# Patient Record
Sex: Female | Born: 1985 | Hispanic: No | Marital: Single | Smoking: Current every day smoker
Health system: Southern US, Community
[De-identification: ages and names within clinical notes are randomized; demographics above are authoritative.]

## PROBLEM LIST (undated history)

## (undated) DIAGNOSIS — N390 Urinary tract infection, site not specified: Secondary | ICD-10-CM

## (undated) DIAGNOSIS — N730 Acute parametritis and pelvic cellulitis: Secondary | ICD-10-CM

## (undated) DIAGNOSIS — I38 Endocarditis, valve unspecified: Secondary | ICD-10-CM

## (undated) DIAGNOSIS — T1491XA Suicide attempt, initial encounter: Secondary | ICD-10-CM

## (undated) DIAGNOSIS — F329 Major depressive disorder, single episode, unspecified: Secondary | ICD-10-CM

## (undated) DIAGNOSIS — M4622 Osteomyelitis of vertebra, cervical region: Secondary | ICD-10-CM

## (undated) DIAGNOSIS — D649 Anemia, unspecified: Secondary | ICD-10-CM

## (undated) DIAGNOSIS — D489 Neoplasm of uncertain behavior, unspecified: Secondary | ICD-10-CM

## (undated) DIAGNOSIS — F1990 Other psychoactive substance use, unspecified, uncomplicated: Secondary | ICD-10-CM

## (undated) DIAGNOSIS — B1920 Unspecified viral hepatitis C without hepatic coma: Secondary | ICD-10-CM

## (undated) DIAGNOSIS — F32A Depression, unspecified: Secondary | ICD-10-CM

## (undated) DIAGNOSIS — F419 Anxiety disorder, unspecified: Secondary | ICD-10-CM

## (undated) HISTORY — PX: LAPAROSCOPY: SHX197

---
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
## 2012-10-15 ENCOUNTER — Encounter (HOSPITAL_COMMUNITY): Payer: Self-pay | Admitting: *Deleted

## 2012-10-15 ENCOUNTER — Emergency Department (HOSPITAL_COMMUNITY)
Admission: EM | Admit: 2012-10-15 | Discharge: 2012-10-16 | Disposition: A | Discharge status: Home / Self Care | Payer: Self-pay | Attending: Emergency Medicine | Admitting: Emergency Medicine

## 2012-10-15 DIAGNOSIS — R61 Generalized hyperhidrosis: Secondary | ICD-10-CM | POA: Insufficient documentation

## 2012-10-15 DIAGNOSIS — F1110 Opioid abuse, uncomplicated: Secondary | ICD-10-CM | POA: Insufficient documentation

## 2012-10-15 DIAGNOSIS — R259 Unspecified abnormal involuntary movements: Secondary | ICD-10-CM | POA: Insufficient documentation

## 2012-10-15 DIAGNOSIS — R110 Nausea: Secondary | ICD-10-CM | POA: Insufficient documentation

## 2012-10-15 DIAGNOSIS — F1010 Alcohol abuse, uncomplicated: Secondary | ICD-10-CM | POA: Insufficient documentation

## 2012-10-15 DIAGNOSIS — F17200 Nicotine dependence, unspecified, uncomplicated: Secondary | ICD-10-CM | POA: Insufficient documentation

## 2012-10-15 DIAGNOSIS — Z79899 Other long term (current) drug therapy: Secondary | ICD-10-CM | POA: Insufficient documentation

## 2012-10-15 DIAGNOSIS — IMO0001 Reserved for inherently not codable concepts without codable children: Secondary | ICD-10-CM | POA: Insufficient documentation

## 2012-10-15 DIAGNOSIS — N390 Urinary tract infection, site not specified: Secondary | ICD-10-CM | POA: Insufficient documentation

## 2012-10-15 DIAGNOSIS — R1084 Generalized abdominal pain: Secondary | ICD-10-CM | POA: Insufficient documentation

## 2012-10-15 LAB — CBC
HCT: 39.2 % (ref 36.0–46.0)
Hemoglobin: 13.3 g/dL (ref 12.0–15.0)
MCV: 89.3 fL (ref 78.0–100.0)
RBC: 4.39 MIL/uL (ref 3.87–5.11)
RDW: 12.8 % (ref 11.5–15.5)
WBC: 9.6 10*3/uL (ref 4.0–10.5)

## 2012-10-15 LAB — COMPREHENSIVE METABOLIC PANEL
Alkaline Phosphatase: 64 U/L (ref 39–117)
BUN: 9 mg/dL (ref 6–23)
CO2: 28 mEq/L (ref 19–32)
Chloride: 104 mEq/L (ref 96–112)
Creatinine, Ser: 0.57 mg/dL (ref 0.50–1.10)
GFR calc Af Amer: >90 mL/min (ref >90)
GFR calc non Af Amer: >90 mL/min (ref >90)
Glucose, Bld: 89 mg/dL (ref 70–99)
Potassium: 3.8 mEq/L (ref 3.5–5.1)
Total Bilirubin: 0.3 mg/dL (ref 0.3–1.2)

## 2012-10-15 LAB — URINALYSIS, ROUTINE W REFLEX MICROSCOPIC
Bilirubin Urine: NEGATIVE
Nitrite: POSITIVE — AB
Specific Gravity, Urine: 1.024 (ref 1.005–1.030)
Urobilinogen, UA: 0.2 mg/dL (ref 0.0–1.0)
pH: 6.5 (ref 5.0–8.0)

## 2012-10-15 LAB — URINE MICROSCOPIC-ADD ON

## 2012-10-15 LAB — ETHANOL: Alcohol, Ethyl (B): <11 mg/dL (ref 0–11)

## 2012-10-15 LAB — ACETAMINOPHEN LEVEL: Acetaminophen (Tylenol), Serum: <15 ug/mL (ref 10–30)

## 2012-10-15 LAB — POCT PREGNANCY, URINE: Preg Test, Ur: NEGATIVE

## 2012-10-15 LAB — SALICYLATE LEVEL: Salicylate Lvl: <2 mg/dL — ABNORMAL LOW (ref 2.8–20.0)

## 2012-10-15 LAB — RAPID URINE DRUG SCREEN, HOSP PERFORMED
Barbiturates: NOT DETECTED
Benzodiazepines: POSITIVE — AB

## 2012-10-15 MED ORDER — IBUPROFEN 200 MG PO TABS
600.0000 mg | ORAL_TABLET | Freq: Three times a day (TID) | ORAL | Status: DC | PRN
  Administered 2012-10-15 – 2012-10-16 (×2): 600 mg via ORAL
  Filled 2012-10-15 (×2): qty 1

## 2012-10-15 MED ORDER — PROMETHAZINE HCL 25 MG PO TABS
25.0000 mg | ORAL_TABLET | Freq: Once | ORAL | Status: AC
  Administered 2012-10-15: 25 mg via ORAL
  Filled 2012-10-15 (×2): qty 1

## 2012-10-15 MED ORDER — LORAZEPAM 1 MG PO TABS: 0.0000 mg | ORAL_TABLET | Freq: Four times a day (QID) | ORAL | Status: DC

## 2012-10-15 MED ORDER — FOLIC ACID 1 MG PO TABS
1.0000 mg | ORAL_TABLET | Freq: Once | ORAL | Status: AC
  Administered 2012-10-15: 1 mg via ORAL
  Filled 2012-10-15: qty 1

## 2012-10-15 MED ORDER — VITAMIN B-1 100 MG PO TABS
100.0000 mg | ORAL_TABLET | Freq: Once | ORAL | Status: AC
  Administered 2012-10-15: 100 mg via ORAL
  Filled 2012-10-15: qty 1

## 2012-10-15 MED ORDER — NICOTINE 21 MG/24HR TD PT24: 21.0000 mg | MEDICATED_PATCH | Freq: Every day | TRANSDERMAL | Status: DC

## 2012-10-15 MED ORDER — IBUPROFEN 400 MG PO TABS
800.0000 mg | ORAL_TABLET | Freq: Once | ORAL | Status: AC
  Administered 2012-10-15: 800 mg via ORAL
  Filled 2012-10-15: qty 2

## 2012-10-15 MED ORDER — LORAZEPAM 1 MG PO TABS
1.0000 mg | ORAL_TABLET | Freq: Three times a day (TID) | ORAL | Status: DC | PRN
  Administered 2012-10-16: 1 mg via ORAL
  Filled 2012-10-15: qty 1

## 2012-10-15 MED ORDER — LORAZEPAM 1 MG PO TABS: 0.0000 mg | ORAL_TABLET | Freq: Two times a day (BID) | ORAL | Status: DC

## 2012-10-15 MED ORDER — LORAZEPAM 1 MG PO TABS
1.0000 mg | ORAL_TABLET | Freq: Once | ORAL | Status: AC
  Administered 2012-10-15: 1 mg via ORAL
  Filled 2012-10-15 (×2): qty 2

## 2012-10-15 MED ORDER — ACETAMINOPHEN 325 MG PO TABS
650.0000 mg | ORAL_TABLET | ORAL | Status: DC | PRN
  Administered 2012-10-15: 650 mg via ORAL
  Filled 2012-10-15: qty 2

## 2012-10-15 MED ORDER — FOLIC ACID 1 MG PO TABS: 1.0000 mg | ORAL_TABLET | Freq: Every day | ORAL | Status: DC

## 2012-10-15 MED ORDER — ONDANSETRON HCL 4 MG PO TABS: 4.0000 mg | ORAL_TABLET | Freq: Three times a day (TID) | ORAL | Status: DC | PRN

## 2012-10-15 MED ORDER — CLONIDINE HCL 0.1 MG PO TABS
0.1000 mg | ORAL_TABLET | Freq: Once | ORAL | Status: AC
  Administered 2012-10-15: 0.1 mg via ORAL
  Filled 2012-10-15: qty 1

## 2012-10-15 MED ORDER — NITROFURANTOIN MONOHYD MACRO 100 MG PO CAPS
100.0000 mg | ORAL_CAPSULE | Freq: Two times a day (BID) | ORAL | Status: DC
  Administered 2012-10-15: 100 mg via ORAL
  Filled 2012-10-15: qty 1

## 2012-10-15 MED ORDER — ZOLPIDEM TARTRATE 5 MG PO TABS
5.0000 mg | ORAL_TABLET | Freq: Every evening | ORAL | Status: DC | PRN
  Filled 2012-10-15: qty 1

## 2012-10-15 MED ORDER — ALPRAZOLAM 0.25 MG PO TABS
1.0000 mg | ORAL_TABLET | Freq: Once | ORAL | Status: AC
  Administered 2012-10-15: 1 mg via ORAL
  Filled 2012-10-15: qty 4

## 2012-10-15 MED ORDER — VITAMIN B-1 100 MG PO TABS: 100.0000 mg | ORAL_TABLET | Freq: Every day | ORAL | Status: DC

## 2012-10-15 MED ORDER — ALUM & MAG HYDROXIDE-SIMETH 200-200-20 MG/5ML PO SUSP: 30.0000 mL | ORAL | Status: DC | PRN

## 2012-10-15 MED ORDER — ONDANSETRON 4 MG PO TBDP
8.0000 mg | ORAL_TABLET | Freq: Once | ORAL | Status: AC
  Administered 2012-10-15: 8 mg via ORAL
  Filled 2012-10-15: qty 2

## 2012-10-15 MED ORDER — ADULT MULTIVITAMIN W/MINERALS CH: 1.0000 | ORAL_TABLET | Freq: Every day | ORAL | Status: DC

## 2012-10-15 MED ORDER — THIAMINE HCL 100 MG/ML IJ SOLN: 100.0000 mg | Freq: Every day | INTRAMUSCULAR | Status: DC

## 2012-10-15 MED ORDER — ADULT MULTIVITAMIN W/MINERALS CH
1.0000 | ORAL_TABLET | Freq: Once | ORAL | Status: AC
  Administered 2012-10-15: 1 via ORAL
  Filled 2012-10-15: qty 1

## 2012-10-15 NOTE — BH Assessment (Signed)
BHH Assessment Progress Note   There are no beds available on the adult unit at [facility].  There is no detox funding available at [facility] at this time.  Clinician called [facility] and they have no female beds available for detox at this time.  Clinician talked to patient about bed availability.  Patient agreed to use outpatient resources and was given a list of them.

## 2012-10-15 NOTE — ED Notes (Signed)
Pt here for detox from heroin and etoh.  Etoh last use was 0100 and pt reports shakey when coming off.  Last heroin use was yesterday.  NO SI/HI.

## 2012-10-15 NOTE — ED Notes (Signed)
Berna Spare from [facility] Team in to see pt

## 2012-10-15 NOTE — ED Notes (Addendum)
Pt states she is here to detox from ETOH and heroin. Last use of both yesterday. Pt states she feels horrible and shaky. Skin warm and dry. Pt is alert and oriented x4. Denies SI/HI.

## 2012-10-15 NOTE — ED Notes (Signed)
Meal tray ordered 

## 2012-10-15 NOTE — ED Notes (Signed)
Pt's Mom Maniya Donovan [phone] mobile) here to see pt. Disposition still pending and to call pt's mom when she is ready.

## 2012-10-15 NOTE — ED Provider Notes (Signed)
History     CSN: [id number]  Arrival date & time 10/15/12  1248   First MD Initiated Contact with Patient 10/15/12 1307      Chief Complaint  Patient presents with  . Medical Clearance    (Consider location/radiation/quality/duration/timing/severity/associated sxs/prior treatment) HPI Comments: Patient is a 27 year old female who presents for withdrawal from alcohol and heroin. Patient endorses symptoms which include myalgias, cold sweats, "feeling shaky", generalized abdominal cramping, and nausea. Patient states the symptoms are constant without any aggravating or alleviating factors. Patient states she last drank alcohol at approximately 1 AM this morning; she drank a total of 6 beers. She also admits to using heroin intravenously at 4 PM yesterday. Patient denies suicidal or homicidal ideation and any other medical complaints. Patient states she has tried to detox in the past unsuccessfully.  The history is provided by the patient. No language interpreter was used.    History reviewed. No pertinent past medical history.  Past Surgical History  Procedure Laterality Date  . Laparoscopy      3 weeks ago    No family history on file.  History  Substance Use Topics  . Smoking status: Current Every Day Smoker  . Smokeless tobacco: Not on file  . Alcohol Use: Yes     Comment: 6 beers perday    OB History   Grav Para Term Preterm Abortions TAB SAB Ect Mult Living                  Review of Systems  Constitutional: Positive for diaphoresis (cold sweats).  Gastrointestinal: Positive for nausea and abdominal pain (abdominal cramping). Negative for vomiting and diarrhea.  Musculoskeletal: Positive for myalgias.  Psychiatric/Behavioral:       "feeling shaky"  All other systems reviewed and are negative.    Allergies  Review of patient's allergies indicates no known allergies.  Home Medications   Current Outpatient Rx  Name  Route  Sig  Dispense  Refill  .  ALPRAZolam (XANAX) 1 MG tablet   Oral   Take 1 mg by mouth at bedtime as needed for sleep or anxiety.         Marland Kitchen rOPINIRole (REQUIP) 0.5 MG tablet   Oral   Take 0.5 mg by mouth 3 (three) times daily.         . traMADol (ULTRAM) 50 MG tablet   Oral   Take 50 mg by mouth every 6 (six) hours as needed for pain.           BP 137/88  Pulse 89  Temp(Src) 98.3 F (36.8 C) (Oral)  Resp 16  SpO2 97%  LMP 10/07/2012  Physical Exam  Nursing note and vitals reviewed. Constitutional: She is oriented to person, place, and time. She appears well-developed and well-nourished. No distress.  HENT:  Head: Normocephalic and atraumatic.  Mouth/Throat: Oropharynx is clear and moist. No oropharyngeal exudate.  Eyes: Conjunctivae and EOM are normal. No scleral icterus.  Neck: Normal range of motion. Neck supple.  Cardiovascular: Normal rate, regular rhythm, normal heart sounds and intact distal pulses.   Pulmonary/Chest: Effort normal and breath sounds normal. No respiratory distress. She has no wheezes. She has no rales.  Abdominal: Soft. She exhibits no distension and no mass. There is tenderness (generalized without peritoneal signs). There is no rebound and no guarding.  Musculoskeletal: Normal range of motion.  Neurological: She is alert and oriented to person, place, and time.  Skin: Skin is warm and dry. No rash noted.  She is not diaphoretic. No erythema. No pallor.  Psychiatric: She has a normal mood and affect. Her behavior is normal.    ED Course  Procedures (including critical care time)  Labs Reviewed  CBC - Abnormal; Notable for the following:    Platelets 415 (*)    All other components within normal limits  SALICYLATE LEVEL - Abnormal; Notable for the following:    Salicylate Lvl <2.0 (*)    All other components within normal limits  URINE RAPID DRUG SCREEN (HOSP PERFORMED) - Abnormal; Notable for the following:    Opiates POSITIVE (*)    Benzodiazepines POSITIVE (*)     Tetrahydrocannabinol POSITIVE (*)    All other components within normal limits  URINALYSIS, ROUTINE W REFLEX MICROSCOPIC - Abnormal; Notable for the following:    APPearance CLOUDY (*)    Nitrite POSITIVE (*)    Leukocytes, UA LARGE (*)    All other components within normal limits  URINE MICROSCOPIC-ADD ON - Abnormal; Notable for the following:    Bacteria, UA MANY (*)    All other components within normal limits  URINE CULTURE  ACETAMINOPHEN LEVEL  COMPREHENSIVE METABOLIC PANEL  ETHANOL  POCT PREGNANCY, URINE   No results found.   1. Heroin abuse   2. ETOH abuse   3. UTI (urinary tract infection)      MDM  Patient is a 27 year old female with a history of alcohol and heroin abuse who presents for treatment of withdrawal symptoms. On physical exam patient is alert and oriented x4, nontoxic appearing, heart RRR, lungs CTAB, abdomen NTTP. Urine drug screen positive for opiates, benzodiazepines, and marijuana. No evidence of infection, anemia, or electrolyte imbalance. Liver and kidney function preserved. Urinalysis pending. Consult placed to the act team for patient evaluation and possible placement in a rehabilitation facility.  Patient signed out to Dr. Billey Chang at end of shift. Urinalysis pending.   18:21 - Patient's urine significant for infection with positive bacteria, leukocytes, nitrites; urine resulted after end of shift. Have spoken with [facility] Nurse via phone who will administer first dose of antibiotics in ED tonight. Patient medically cleared.      Antony Madura, PA-C 10/15/12 1827  Antony Madura, PA-C 10/16/12 [phone]

## 2012-10-15 NOTE — ED Provider Notes (Signed)
Per [facility] staff, patient is appropriate for D/C.  She will be provided outpatient resources  Gerhard Munch, MD 10/15/12 2308

## 2012-10-15 NOTE — ED Notes (Signed)
Dr. Jeraldine Loots rounding and pt to be discharged. Pt's mother called and vm left that pt is being discharged.

## 2012-10-16 NOTE — ED Notes (Signed)
Mother at bedside discharge instructions given to patient

## 2012-10-18 LAB — URINE CULTURE

## 2012-10-18 NOTE — ED Provider Notes (Signed)
Medical screening examination/treatment/procedure(s) were performed by non-physician practitioner and as supervising physician I was immediately available for consultation/collaboration.   Suzi Roots, MD 10/18/12 1100

## 2012-10-20 NOTE — Progress Notes (Signed)
ED Antimicrobial Stewardship Positive Culture Follow Up   Sherri Francis is an 27 y.o. female who presented to [facility] on 10/15/2012 with a chief complaint of  Chief Complaint  Patient presents with  . Medical Clearance    Recent Results (from the past 720 hour(s))  URINE CULTURE     Status: None   Collection Time    10/15/12  1:41 PM      Result Value Range Status   Specimen Description URINE, CLEAN CATCH   Final   Special Requests NONE   Final   Culture  Setup Time 10/16/2012 12:55   Final   Colony Count >=100,000 COLONIES/ML   Final   Culture ESCHERICHIA COLI   Final   Report Status 10/18/2012 FINAL   Final   Organism ID, Bacteria ESCHERICHIA COLI   Final    []  Treated with , organism resistant to prescribed antimicrobial [x]  Patient discharged originally without antimicrobial agent and treatment is now indicated  New antibiotic prescription: MacroBID 100mg  po BID x 3 days  ED Provider: Pascal Lux Wingen, PA-C   Mickeal Skinner 10/20/2012, 2:19 PM Infectious Diseases Pharmacist Phone# [phone]

## 2012-10-20 NOTE — ED Notes (Signed)
Post ED Visit - Positive Culture Follow-up: Unsuccessful Patient Follow-up  Culture assessed and recommendations reviewed by: []  Wes Dulaney, Pharm.D., BCPS [x]  Celedonio Miyamoto, Pharm.D., BCPS []  Georgina Pillion, Pharm.D., BCPS []  Anna, [place].D., BCPS, AAHIVP []  Estella Husk, Pharm.D., BCPS, AAHIVP  Positive Urine culture  [x]  Patient discharged without antimicrobial prescription and treatment is now indicated []  Organism is resistant to prescribed ED discharge antimicrobial []  Patient with positive blood cultures  Plan: Macrobid 100 mg 1 BID x 3 days per Pascal Lux Wingen.    Larena Sox 10/20/2012, 6:09 PM

## 2012-10-22 ENCOUNTER — Telehealth (HOSPITAL_COMMUNITY): Payer: Self-pay | Admitting: Emergency Medicine

## 2012-10-23 ENCOUNTER — Telehealth (HOSPITAL_COMMUNITY): Payer: Self-pay | Admitting: Emergency Medicine

## 2012-10-25 ENCOUNTER — Telehealth (HOSPITAL_COMMUNITY): Payer: Self-pay | Admitting: Emergency Medicine

## 2012-10-25 NOTE — ED Notes (Signed)
Unable to contact patient via phone. Sent letter. °

## 2012-11-22 ENCOUNTER — Telehealth (HOSPITAL_COMMUNITY): Payer: Self-pay | Admitting: Emergency Medicine

## 2016-01-13 ENCOUNTER — Encounter (HOSPITAL_COMMUNITY): Payer: Self-pay | Admitting: *Deleted

## 2016-01-13 ENCOUNTER — Inpatient Hospital Stay (HOSPITAL_COMMUNITY)
Admission: EM | Admit: 2016-01-13 | Discharge: 2016-01-16 | DRG: 759 | Disposition: A | Discharge status: Home / Self Care | Payer: Self-pay | Attending: Obstetrics & Gynecology | Admitting: Obstetrics & Gynecology

## 2016-01-13 ENCOUNTER — Emergency Department (HOSPITAL_COMMUNITY): Payer: Self-pay

## 2016-01-13 DIAGNOSIS — N739 Female pelvic inflammatory disease, unspecified: Secondary | ICD-10-CM | POA: Diagnosis present

## 2016-01-13 DIAGNOSIS — N83519 Torsion of ovary and ovarian pedicle, unspecified side: Secondary | ICD-10-CM

## 2016-01-13 DIAGNOSIS — N732 Unspecified parametritis and pelvic cellulitis: Secondary | ICD-10-CM

## 2016-01-13 DIAGNOSIS — N730 Acute parametritis and pelvic cellulitis: Principal | ICD-10-CM | POA: Diagnosis present

## 2016-01-13 DIAGNOSIS — R1111 Vomiting without nausea: Secondary | ICD-10-CM

## 2016-01-13 DIAGNOSIS — F17210 Nicotine dependence, cigarettes, uncomplicated: Secondary | ICD-10-CM | POA: Diagnosis present

## 2016-01-13 LAB — WET PREP, GENITAL
CLUE CELLS WET PREP: NONE SEEN
Sperm: NONE SEEN
TRICH WET PREP: NONE SEEN
Yeast Wet Prep HPF POC: NONE SEEN

## 2016-01-13 LAB — CBC
HCT: 38.4 % (ref 36.0–46.0)
HEMOGLOBIN: 13 g/dL (ref 12.0–15.0)
MCH: 28 pg (ref 26.0–34.0)
MCHC: 33.9 g/dL (ref 30.0–36.0)
MCV: 82.6 fL (ref 78.0–100.0)
Platelets: 485 10*3/uL — ABNORMAL HIGH (ref 150–400)
RBC: 4.65 MIL/uL (ref 3.87–5.11)
RDW: 14.5 % (ref 11.5–15.5)
WBC: 26.1 10*3/uL — ABNORMAL HIGH (ref 4.0–10.5)

## 2016-01-13 LAB — I-STAT BETA HCG BLOOD, ED (MC, WL, AP ONLY)

## 2016-01-13 LAB — COMPREHENSIVE METABOLIC PANEL
ALBUMIN: 4 g/dL (ref 3.5–5.0)
ALK PHOS: 80 U/L (ref 38–126)
ALT: 34 U/L (ref 14–54)
ANION GAP: 13 (ref 5–15)
AST: 22 U/L (ref 15–41)
BILIRUBIN TOTAL: 1 mg/dL (ref 0.3–1.2)
BUN: 9 mg/dL (ref 6–20)
CALCIUM: 9.3 mg/dL (ref 8.9–10.3)
CO2: 22 mmol/L (ref 22–32)
Chloride: 100 mmol/L — ABNORMAL LOW (ref 101–111)
Creatinine, Ser: 0.43 mg/dL — ABNORMAL LOW (ref 0.44–1.00)
GFR calc Af Amer: >60 mL/min (ref >60)
GLUCOSE: 120 mg/dL — AB (ref 65–99)
POTASSIUM: 3.5 mmol/L (ref 3.5–5.1)
Sodium: 135 mmol/L (ref 135–145)
TOTAL PROTEIN: 8.6 g/dL — AB (ref 6.5–8.1)

## 2016-01-13 LAB — LIPASE, BLOOD: Lipase: 16 U/L (ref 11–51)

## 2016-01-13 MED ORDER — LORAZEPAM 2 MG/ML IJ SOLN
1.0000 mg | Freq: Once | INTRAMUSCULAR | Status: AC
  Administered 2016-01-13: 1 mg via INTRAVENOUS
  Filled 2016-01-13: qty 1

## 2016-01-13 MED ORDER — IOPAMIDOL (ISOVUE-300) INJECTION 61%
100.0000 mL | Freq: Once | INTRAVENOUS | Status: AC | PRN
  Administered 2016-01-13: 80 mL via INTRAVENOUS

## 2016-01-13 MED ORDER — OXYCODONE-ACETAMINOPHEN 5-325 MG PO TABS
1.0000 | ORAL_TABLET | ORAL | Status: DC | PRN
  Administered 2016-01-15 – 2016-01-16 (×4): 2 via ORAL
  Filled 2016-01-13 (×4): qty 2

## 2016-01-13 MED ORDER — CEFOXITIN SODIUM 2 G IV SOLR
2.0000 g | Freq: Once | INTRAVENOUS | Status: AC
  Administered 2016-01-13: 2 g via INTRAVENOUS
  Filled 2016-01-13: qty 2

## 2016-01-13 MED ORDER — HYDROMORPHONE HCL 1 MG/ML IJ SOLN
1.0000 mg | Freq: Once | INTRAMUSCULAR | Status: AC
  Administered 2016-01-13: 1 mg via INTRAVENOUS
  Filled 2016-01-13: qty 1

## 2016-01-13 MED ORDER — HYDROMORPHONE HCL 1 MG/ML IJ SOLN
0.5000 mg | Freq: Once | INTRAMUSCULAR | Status: AC
  Administered 2016-01-13: 0.5 mg via INTRAVENOUS
  Filled 2016-01-13: qty 1

## 2016-01-13 MED ORDER — PRENATAL MULTIVITAMIN CH: 1.0000 | ORAL_TABLET | Freq: Every day | ORAL | Status: DC

## 2016-01-13 MED ORDER — ONDANSETRON HCL 4 MG/2ML IJ SOLN
4.0000 mg | Freq: Once | INTRAMUSCULAR | Status: AC
  Administered 2016-01-13: 4 mg via INTRAVENOUS
  Filled 2016-01-13: qty 2

## 2016-01-13 MED ORDER — DEXTROSE 5 % IV SOLN
2.0000 g | Freq: Four times a day (QID) | INTRAVENOUS | Status: DC
  Administered 2016-01-14 – 2016-01-16 (×10): 2 g via INTRAVENOUS
  Filled 2016-01-13 (×12): qty 2

## 2016-01-13 MED ORDER — SODIUM CHLORIDE 0.9 % IV BOLUS (SEPSIS)
1000.0000 mL | Freq: Once | INTRAVENOUS | Status: AC
  Administered 2016-01-13: 1000 mL via INTRAVENOUS

## 2016-01-13 MED ORDER — DOXYCYCLINE HYCLATE 100 MG IV SOLR
100.0000 mg | Freq: Once | INTRAVENOUS | Status: AC
  Administered 2016-01-13: 100 mg via INTRAVENOUS
  Filled 2016-01-13: qty 100

## 2016-01-13 MED ORDER — CEFAZOLIN SODIUM-DEXTROSE 2-4 GM/100ML-% IV SOLN: 2.0000 g | Freq: Once | INTRAVENOUS | Status: DC

## 2016-01-13 MED ORDER — SODIUM CHLORIDE 0.9 % IV SOLN
INTRAVENOUS | Status: DC
  Administered 2016-01-13 – 2016-01-16 (×4): via INTRAVENOUS

## 2016-01-13 MED ORDER — KETOROLAC TROMETHAMINE 30 MG/ML IJ SOLN: 30.0000 mg | Freq: Four times a day (QID) | INTRAMUSCULAR | Status: DC

## 2016-01-13 MED ORDER — KETOROLAC TROMETHAMINE 30 MG/ML IJ SOLN
30.0000 mg | Freq: Four times a day (QID) | INTRAMUSCULAR | Status: DC
  Administered 2016-01-14 – 2016-01-16 (×11): 30 mg via INTRAVENOUS
  Filled 2016-01-13 (×11): qty 1

## 2016-01-13 MED ORDER — SODIUM CHLORIDE 0.9 % IV SOLN
INTRAVENOUS | Status: DC
  Administered 2016-01-13: 22:00:00 via INTRAVENOUS

## 2016-01-13 MED ORDER — HYDROMORPHONE HCL 1 MG/ML IJ SOLN
0.2000 mg | INTRAMUSCULAR | Status: DC | PRN
  Administered 2016-01-14 (×5): 0.6 mg via INTRAVENOUS
  Filled 2016-01-13 (×5): qty 1

## 2016-01-13 MED ORDER — DOXYCYCLINE HYCLATE 100 MG IV SOLR
100.0000 mg | Freq: Two times a day (BID) | INTRAVENOUS | Status: DC
  Administered 2016-01-14 – 2016-01-16 (×5): 100 mg via INTRAVENOUS
  Filled 2016-01-13 (×6): qty 100

## 2016-01-13 NOTE — ED Provider Notes (Signed)
[facility] DEPT Provider Note   CSN: [id number] Arrival date & time: 01/13/16  1240  First Provider Contact:  None       History   Chief Complaint Chief Complaint  Patient presents with  . Emesis  . Abdominal Pain    HPI Sherri Francis is a 30 y.o. female.  Patient with acute onset of left lower abdominal/pelvic pain starting at 8:30 PM last night. Pain is been associated with multiple episodes of vomiting. Patient states that she was unable to sleep last night due to pain. She denies vaginal bleeding or discharge. She has had some diarrhea. She has had subjective fever but no documented fever. No treatments prior to arrival. No chest pain or shortness of breath. Patient states no previous abdominal surgeries. History of ovarian cyst when she was younger. Last menstrual period was normal for patient. The onset of this condition was acute. The course is constant. Aggravating factors: none. Alleviating factors: none.        History reviewed. No pertinent past medical history.  There are no active problems to display for this patient.   Past Surgical History:  Procedure Laterality Date  . LAPAROSCOPY     3 weeks ago    OB History    No data available       Home Medications    Prior to Admission medications   Medication Sig Start Date End Date Taking? Authorizing Provider  amphetamine-dextroamphetamine (ADDERALL) 20 MG tablet Take 30 mg by mouth 2 (two) times daily. 07/14/15  Yes Historical Provider, MD    Family History No family history on file.  Social History Social History  Substance Use Topics  . Smoking status: Current Every Day Smoker    Types: Cigarettes  . Smokeless tobacco: Never Used  . Alcohol use Yes     Comment: 6 beers perday     Allergies   Review of patient's allergies indicates no known allergies.   Review of Systems Review of Systems  Constitutional: Negative for fever.  HENT: Negative for rhinorrhea and sore throat.   Eyes:  Negative for redness.  Respiratory: Negative for cough.   Cardiovascular: Negative for chest pain.  Gastrointestinal: Positive for abdominal pain, diarrhea, nausea and vomiting. Negative for blood in stool and constipation.  Genitourinary: Negative for dysuria, vaginal bleeding and vaginal discharge.  Musculoskeletal: Negative for myalgias.  Skin: Negative for rash.  Neurological: Negative for headaches.     Physical Exam Updated Vital Signs BP 140/96 (BP Location: Left Arm)   Pulse (!) 122   Temp 98.4 F (36.9 C) (Oral)   Ht 5\' 1"  (1.549 m)   Wt 45.4 kg   SpO2 100%   BMI 18.89 kg/m   Physical Exam  Constitutional: She appears well-developed and well-nourished. She appears distressed (patient restless, uncomfortable appearing).  HENT:  Head: Normocephalic and atraumatic.  Eyes: Conjunctivae are normal. Right eye exhibits no discharge. Left eye exhibits no discharge.  Neck: Normal range of motion. Neck supple.  Cardiovascular: Normal rate, regular rhythm and normal heart sounds.   Pulmonary/Chest: Effort normal and breath sounds normal.  Abdominal: Soft. She exhibits no distension and no mass. There is tenderness (bilateral lower abd, below umbilicus). There is no guarding.  Genitourinary: Pelvic exam was performed with patient supine. There is no rash, tenderness or lesion on the right labia. There is no rash, tenderness or lesion on the left labia. Uterus is tender. Cervix exhibits motion tenderness. Cervix exhibits no discharge. Right adnexum displays tenderness (Significant). Right  adnexum displays no mass and no fullness. Left adnexum displays tenderness (Significant). Left adnexum displays no mass and no fullness.  Neurological: She is alert.  Skin: Skin is warm and dry.  Psychiatric: She has a normal mood and affect.  Nursing note and vitals reviewed.    ED Treatments / Results  Labs (all labs ordered are listed, but only abnormal results are displayed) Labs Reviewed    WET PREP, GENITAL - Abnormal; Notable for the following:       Result Value   WBC, Wet Prep HPF POC FEW (*)    All other components within normal limits  COMPREHENSIVE METABOLIC PANEL - Abnormal; Notable for the following:    Chloride 100 (*)    Glucose, Bld 120 (*)    Creatinine, Ser 0.43 (*)    Total Protein 8.6 (*)    All other components within normal limits  CBC - Abnormal; Notable for the following:    WBC 26.1 (*)    Platelets 485 (*)    All other components within normal limits  LIPASE, BLOOD  URINALYSIS, ROUTINE W REFLEX MICROSCOPIC (NOT AT [facility])  I-STAT BETA HCG BLOOD, ED ([facility], [facility], [facility] ONLY)  GC/CHLAMYDIA PROBE AMP ([place]) NOT AT [facility]    EKG  EKG Interpretation None       Radiology US Transvaginal Non-ob  Result Date: 01/13/2016 CLINICAL DATA:  Bilateral pelvic pain, left greater than right since 8:30 p.m. last night. Unknown LMP. EXAM: TRANSABDOMINAL AND TRANSVAGINAL ULTRASOUND OF PELVIS DOPPLER ULTRASOUND OF OVARIES TECHNIQUE: Both transabdominal and transvaginal ultrasound examinations of the pelvis were performed. Transabdominal technique was performed for global imaging of the pelvis including uterus, ovaries, adnexal regions, and pelvic cul-de-sac. It was necessary to proceed with endovaginal exam following the transabdominal exam to visualize the ovaries. Color and duplex Doppler ultrasound was utilized to evaluate blood flow to the ovaries. COMPARISON:  None. FINDINGS: Uterus Measurements: 6.4 x 3.0 x 4.3 cm. No fibroids or other mass visualized. Endometrium Thickness: 7.0 mm.  No focal abnormality visualized. Right ovary Measurements: 3.7 x 2.4 x 3.2 cm. Normal appearance/no adnexal mass. Left ovary Measurements: 2.2 x 1.5 x 1.8 cm. Normal appearance/no adnexal mass. Pulsed Doppler evaluation of both ovaries demonstrates normal low-resistance arterial and venous waveforms. Other findings Trace free pelvic fluid noted, likely physiologic. IMPRESSION: Normal pelvic  ultrasound.  No evidence for adnexal mass or torsion. Electronically Signed   By: Nolon Nations M.D.   On: 01/13/2016 17:15   US Pelvis Complete  Result Date: 01/13/2016 CLINICAL DATA:  Bilateral pelvic pain, left greater than right since 8:30 p.m. last night. Unknown LMP. EXAM: TRANSABDOMINAL AND TRANSVAGINAL ULTRASOUND OF PELVIS DOPPLER ULTRASOUND OF OVARIES TECHNIQUE: Both transabdominal and transvaginal ultrasound examinations of the pelvis were performed. Transabdominal technique was performed for global imaging of the pelvis including uterus, ovaries, adnexal regions, and pelvic cul-de-sac. It was necessary to proceed with endovaginal exam following the transabdominal exam to visualize the ovaries. Color and duplex Doppler ultrasound was utilized to evaluate blood flow to the ovaries. COMPARISON:  None. FINDINGS: Uterus Measurements: 6.4 x 3.0 x 4.3 cm. No fibroids or other mass visualized. Endometrium Thickness: 7.0 mm.  No focal abnormality visualized. Right ovary Measurements: 3.7 x 2.4 x 3.2 cm. Normal appearance/no adnexal mass. Left ovary Measurements: 2.2 x 1.5 x 1.8 cm. Normal appearance/no adnexal mass. Pulsed Doppler evaluation of both ovaries demonstrates normal low-resistance arterial and venous waveforms. Other findings Trace free pelvic fluid noted, likely physiologic. IMPRESSION: Normal pelvic  ultrasound.  No evidence for adnexal mass or torsion. Electronically Signed   By: Nolon Nations M.D.   On: 01/13/2016 17:15   Ct Abdomen Pelvis W Contrast  Result Date: 01/13/2016 CLINICAL DATA:  Acute generalized abdominal pain. EXAM: CT ABDOMEN AND PELVIS WITH CONTRAST TECHNIQUE: Multidetector CT imaging of the abdomen and pelvis was performed using the standard protocol following bolus administration of intravenous contrast. CONTRAST:  75mL ISOVUE-300 IOPAMIDOL (ISOVUE-300) INJECTION 61% COMPARISON:  None. FINDINGS: Visualized lung bases are unremarkable. No significant osseous abnormality  is noted. No gallstones are noted. The liver, spleen and pancreas unremarkable. Adrenal glands appear normal. Simple right renal cyst is noted. No hydronephrosis or renal obstruction is noted. Mildly dilated small bowel loops are noted in the left upper quadrant ; is uncertain if this is due to ileus or obstruction. 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 4 x 3 cm complex abnormality is noted in right adnexal region of unknown etiology. Inflammatory stranding is noted in the fat anteriorly in the pelvis. Urinary bladder is decompressed. No significant adenopathy is noted. IMPRESSION: Mildly dilated small bowel loops are noted in the left upper quadrant of the abdomen, and it is uncertain if this is due to ileus or obstruction. 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 Pelvic ultrasound may be performed for further evaluation. Inflammatory stranding is noted in the fat anteriorly in the pelvis of unknown etiology. 4 x 3 cm complex mass is noted posteriorly in the right side of the pelvis ; etiology is unknown, but potentially may represent abscess or neoplasm. Further evaluation with pelvic ultrasound or MRI may be performed. Electronically Signed   By: Marijo Conception, M.D.   On: 01/13/2016 20:09   [place] Art/ven Flow Abd Pelv Doppler  Result Date: 01/13/2016 CLINICAL DATA:  Bilateral pelvic pain, left greater than right since 8:30 p.m. last night. Unknown LMP. EXAM: TRANSABDOMINAL AND TRANSVAGINAL ULTRASOUND OF PELVIS DOPPLER ULTRASOUND OF OVARIES TECHNIQUE: Both transabdominal and transvaginal ultrasound examinations of the pelvis were performed. Transabdominal technique was performed for global imaging of the pelvis including uterus, ovaries, adnexal regions, and pelvic cul-de-sac. It was necessary to proceed with endovaginal exam following the transabdominal exam to visualize the ovaries. Color and duplex Doppler ultrasound was utilized to  evaluate blood flow to the ovaries. COMPARISON:  None. FINDINGS: Uterus Measurements: 6.4 x 3.0 x 4.3 cm. No fibroids or other mass visualized. Endometrium Thickness: 7.0 mm.  No focal abnormality visualized. Right ovary Measurements: 3.7 x 2.4 x 3.2 cm. Normal appearance/no adnexal mass. Left ovary Measurements: 2.2 x 1.5 x 1.8 cm. Normal appearance/no adnexal mass. Pulsed Doppler evaluation of both ovaries demonstrates normal low-resistance arterial and venous waveforms. Other findings Trace free pelvic fluid noted, likely physiologic. IMPRESSION: Normal pelvic ultrasound.  No evidence for adnexal mass or torsion. Electronically Signed   By: Nolon Nations M.D.   On: 01/13/2016 17:15    Procedures Procedures (including critical care time)  Medications Ordered in ED Medications  doxycycline (VIBRAMYCIN) 100 mg in dextrose 5 % 250 mL IVPB (not administered)  cefOXitin (MEFOXIN) 2 g in dextrose 5 % 50 mL IVPB (not administered)  0.9 %  sodium chloride infusion (not administered)  HYDROmorphone (DILAUDID) injection 0.5 mg (0.5 mg Intravenous Given 01/13/16 1625)  ondansetron (ZOFRAN) injection 4 mg (4 mg Intravenous Given 01/13/16 1625)  sodium chloride 0.9 % bolus 1,000 mL (1,000 mLs Intravenous New Bag/Given 01/13/16 1625)  HYDROmorphone (DILAUDID) injection 1 mg (1 mg Intravenous Given 01/13/16 1734)  iopamidol (ISOVUE-300) 61 % injection 100 mL (80 mLs Intravenous Contrast Given 01/13/16 1917)  LORazepam (ATIVAN) injection 1 mg (1 mg Intravenous Given 01/13/16 1914)  HYDROmorphone (DILAUDID) injection 1 mg (1 mg Intravenous Given 01/13/16 2007)     Initial Impression / Assessment and Plan / ED Course  I have reviewed the triage vital signs and the nursing notes.  Pertinent labs & imaging results that were available during my care of the patient were reviewed by me and considered in my medical decision making (see chart for details).  Clinical Course  Comment By Time  Patient is very uncomfortable,  will first control pain. Given pelvic pain with vomiting of acute onset, will need to rule out ovarian torsion. Will perform pelvic exam and patient comfortable. Ultrasound ordered. If this is negative, may need CT of abdomen and pelvis. Patient's white blood cell count is 26,000. Carlisle Cater, PA-C 08/07 1617   Pelvic ultrasound negative. CT ordered.  Pelvic exam performed with nurse tech chaperone. Awaiting CT results.  Patient discussed with and seen by Dr. Lita Mains. [facility].  9:33 PM Spoke with Dr. Roselie Awkward who requests patient be admitted to [facility] for IV abx in setting of possible PID/pelvic abscess. Pt started On Ancef and doxycycline.  Patient updated. Family aware.  Final Clinical Impressions(s) / ED Diagnoses   Final diagnoses:  Pelvic abscess in female  Non-intractable vomiting without nausea, vomiting of unspecified type   Admit.    New Prescriptions New Prescriptions   No medications on file     Carlisle Cater, PA-C 01/13/16 2248    Julianne Rice, MD 01/17/16 [phone]

## 2016-01-13 NOTE — ED Notes (Signed)
Patient transported to CT 

## 2016-01-13 NOTE — ED Notes (Signed)
Patient transported to Ultrasound 

## 2016-01-13 NOTE — ED Triage Notes (Addendum)
Per EMS - pt from home with c/o N/V/D and abdominal pain and cramping x20 hours.  Pt denies drug use, but has apparent track marks on arms.  Patient denies hematemesis and melena.  Patient is normotensive, 142/100, 96 HR, 99% on RA and CBG 172.  Patient received 4 mg Zofran IVP en route with EMS.

## 2016-01-13 NOTE — H&P (Signed)
History                        Chief Complaint    Chief Complaint  Patient presents with  . Emesis  . Abdominal Pain    HPI Sherri Francis is a 30 y.o. female. G0, Patient's last menstrual period was 12/30/2015.   Patient with acute onset of left lower abdominal/pelvic pain starting at 8:30 PM last night. Pain is been associated with multiple episodes of vomiting. Patient states that she was unable to sleep last night due to pain. She denies vaginal bleeding or discharge. She has had some diarrhea. She has had subjective fever but no documented fever. No treatments prior to arrival. No chest pain or shortness of breath. Patient states no previous abdominal surgeries. History of ovarian cyst when she was younger. Last menstrual period was normal for patient. The onset of this condition was acute. The course is constant. Aggravating factors: none. Alleviating factors: none.      History reviewed. No pertinent past medical history.  There are no active problems to display for this patient.        Past Surgical History:  Procedure Laterality Date  . LAPAROSCOPY  2013          OB History    No data available    H/o endometriosis after laparoscopy 2013, [place] Medications                                Prior to Admission medications   Medication Sig Start Date End Date Taking? Authorizing Provider  amphetamine-dextroamphetamine (ADDERALL) 20 MG tablet Take 30 mg by mouth 2 (two) times daily. 07/14/15  Yes Historical Provider, MD    Family History No family history on file.  Social History        Social History  Substance Use Topics  . Smoking status: Current Every Day Smoker    Types: Cigarettes  . Smokeless tobacco: Never Used  . Alcohol use Yes      Comment: 6 beers perday     Allergies                     Review of patient's allergies indicates no known allergies.   Review of Systems Review of Systems   Constitutional: Negative for fever.  HENT: Negative for rhinorrhea and sore throat.   Eyes: Negative for redness.  Respiratory: Negative for cough.   Cardiovascular: Negative for chest pain.  Gastrointestinal: Positive for abdominal pain, diarrhea, nausea and vomiting. Negative for blood in stool and constipation.  Genitourinary: Negative for dysuria, vaginal bleeding and vaginal discharge.  Musculoskeletal: Negative for myalgias.  Skin: Negative for rash.  Neurological: Negative for headaches.     Physical Exam Updated Vital Signs Blood pressure 126/83, pulse (!) 106, temperature 98.8 F (37.1 C), temperature source Oral, resp. rate 20, height 5\' 1"  (1.549 m), weight 100 lb (45.4 kg), last menstrual period 12/30/2015, SpO2 100 %.  Physical Exam  Constitutional: She appears well-developed and well-nourished. She appears distressed (patient restless, uncomfortable appearing).  HENT:  Head: Normocephalic and atraumatic.  Eyes: Conjunctivae are normal. Right eye exhibits no discharge. Left eye exhibits no discharge.  Neck: Normal range of motion. Neck supple.  Cardiovascular: Normal rate, regular rhythm and normal heart sounds.   Pulmonary/Chest: Effort normal and breath sounds normal.  Abdominal: Soft. She exhibits no distension and  no mass. There is tenderness (bilateral lower abd, below umbilicus). There is no guarding.  Genitourinary: Pelvic exam was performed with patient supine. There is no rash, tenderness or lesion on the right labia. Pelvic exam at [facility] There is no rash, tenderness or lesion on the left labia. Uterus is tender. Cervix exhibits motion tenderness. Cervix exhibits no discharge. Right adnexum displays tenderness (Significant). Right adnexum displays no mass and no fullness. Left adnexum displays tenderness (Significant). Left adnexum displays no mass and no fullness.  Neurological: She is alert.  Skin: Skin is warm and dry.  Psychiatric: She has a normal mood  and affect.  Nursing note and vitals reviewed.    ED Treatments / Results  Labs (all labs ordered are listed, but only abnormal results are displayed)      Labs Reviewed  WET PREP, GENITAL - Abnormal; Notable for the following:       Result Value    WBC, Wet Prep HPF POC FEW (*)    All other components within normal limits  COMPREHENSIVE METABOLIC PANEL - Abnormal; Notable for the following:    Chloride 100 (*)    Glucose, Bld 120 (*)    Creatinine, Ser 0.43 (*)    Total Protein 8.6 (*)    All other components within normal limits  CBC - Abnormal; Notable for the following:    WBC 26.1 (*)    Platelets 485 (*)    All other components within normal limits  LIPASE, BLOOD  URINALYSIS, ROUTINE W REFLEX MICROSCOPIC (NOT AT [facility])  I-STAT BETA HCG BLOOD, ED ([facility], [facility], [facility] ONLY)  GC/CHLAMYDIA PROBE AMP ([place]) NOT AT [facility]    EKG      EKG Interpretation None      Radiology  ImagingResults(Last48hours)  US Transvaginal Non-ob  Result Date: 01/13/2016 CLINICAL DATA:  Bilateral pelvic pain, left greater than right since 8:30 p.m. last night. Unknown LMP. EXAM: TRANSABDOMINAL AND TRANSVAGINAL ULTRASOUND OF PELVIS DOPPLER ULTRASOUND OF OVARIES TECHNIQUE: Both transabdominal and transvaginal ultrasound examinations of the pelvis were performed. Transabdominal technique was performed for global imaging of the pelvis including uterus, ovaries, adnexal regions, and pelvic cul-de-sac. It was necessary to proceed with endovaginal exam following the transabdominal exam to visualize the ovaries. Color and duplex Doppler ultrasound was utilized to evaluate blood flow to the ovaries. COMPARISON:  None. FINDINGS: Uterus Measurements: 6.4 x 3.0 x 4.3 cm. No fibroids or other mass visualized. Endometrium Thickness: 7.0 mm.  No focal abnormality visualized. Right ovary Measurements: 3.7 x 2.4 x 3.2 cm. Normal appearance/no adnexal mass. Left ovary Measurements: 2.2 x  1.5 x 1.8 cm. Normal appearance/no adnexal mass. Pulsed Doppler evaluation of both ovaries demonstrates normal low-resistance arterial and venous waveforms. Other findings Trace free pelvic fluid noted, likely physiologic. IMPRESSION: Normal pelvic ultrasound.  No evidence for adnexal mass or torsion. Electronically Signed   By: Nolon Nations M.D.   On: 01/13/2016 17:15   US Pelvis Complete  Result Date: 01/13/2016 CLINICAL DATA:  Bilateral pelvic pain, left greater than right since 8:30 p.m. last night. Unknown LMP. EXAM: TRANSABDOMINAL AND TRANSVAGINAL ULTRASOUND OF PELVIS DOPPLER ULTRASOUND OF OVARIES TECHNIQUE: Both transabdominal and transvaginal ultrasound examinations of the pelvis were performed. Transabdominal technique was performed for global imaging of the pelvis including uterus, ovaries, adnexal regions, and pelvic cul-de-sac. It was necessary to proceed with endovaginal exam following the transabdominal exam to visualize the ovaries. Color and duplex Doppler ultrasound was utilized to evaluate blood flow to the ovaries. COMPARISON:  None. FINDINGS: Uterus Measurements: 6.4 x 3.0 x 4.3 cm. No fibroids or other mass visualized. Endometrium Thickness: 7.0 mm.  No focal abnormality visualized. Right ovary Measurements: 3.7 x 2.4 x 3.2 cm. Normal appearance/no adnexal mass. Left ovary Measurements: 2.2 x 1.5 x 1.8 cm. Normal appearance/no adnexal mass. Pulsed Doppler evaluation of both ovaries demonstrates normal low-resistance arterial and venous waveforms. Other findings Trace free pelvic fluid noted, likely physiologic. IMPRESSION: Normal pelvic ultrasound.  No evidence for adnexal mass or torsion. Electronically Signed   By: Nolon Nations M.D.   On: 01/13/2016 17:15   Ct Abdomen Pelvis W Contrast  Result Date: 01/13/2016 CLINICAL DATA:  Acute generalized abdominal pain. EXAM: CT ABDOMEN AND PELVIS WITH CONTRAST TECHNIQUE: Multidetector CT imaging of the abdomen and pelvis was performed  using the standard protocol following bolus administration of intravenous contrast. CONTRAST:  50mL ISOVUE-300 IOPAMIDOL (ISOVUE-300) INJECTION 61% COMPARISON:  None. FINDINGS: Visualized lung bases are unremarkable. No significant osseous abnormality is noted. No gallstones are noted. The liver, spleen and pancreas unremarkable. Adrenal glands appear normal. Simple right renal cyst is noted. No hydronephrosis or renal obstruction is noted. Mildly dilated small bowel loops are noted in the left upper quadrant ; is uncertain if this is due to ileus or obstruction. 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 4 x 3 cm complex abnormality is noted in right adnexal region of unknown etiology. Inflammatory stranding is noted in the fat anteriorly in the pelvis. Urinary bladder is decompressed. No significant adenopathy is noted. IMPRESSION: Mildly dilated small bowel loops are noted in the left upper quadrant of the abdomen, and it is uncertain if this is due to ileus or obstruction. 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 Pelvic ultrasound may be performed for further evaluation. Inflammatory stranding is noted in the fat anteriorly in the pelvis of unknown etiology. 4 x 3 cm complex mass is noted posteriorly in the right side of the pelvis ; etiology is unknown, but potentially may represent abscess or neoplasm. Further evaluation with pelvic ultrasound or MRI may be performed. Electronically Signed   By: Marijo Conception, M.D.   On: 01/13/2016 20:09   [place] Art/ven Flow Abd Pelv Doppler  Result Date: 01/13/2016 CLINICAL DATA:  Bilateral pelvic pain, left greater than right since 8:30 p.m. last night. Unknown LMP. EXAM: TRANSABDOMINAL AND TRANSVAGINAL ULTRASOUND OF PELVIS DOPPLER ULTRASOUND OF OVARIES TECHNIQUE: Both transabdominal and transvaginal ultrasound examinations of the pelvis were performed. Transabdominal technique was performed for global  imaging of the pelvis including uterus, ovaries, adnexal regions, and pelvic cul-de-sac. It was necessary to proceed with endovaginal exam following the transabdominal exam to visualize the ovaries. Color and duplex Doppler ultrasound was utilized to evaluate blood flow to the ovaries. COMPARISON:  None. FINDINGS: Uterus Measurements: 6.4 x 3.0 x 4.3 cm. No fibroids or other mass visualized. Endometrium Thickness: 7.0 mm.  No focal abnormality visualized. Right ovary Measurements: 3.7 x 2.4 x 3.2 cm. Normal appearance/no adnexal mass. Left ovary Measurements: 2.2 x 1.5 x 1.8 cm. Normal appearance/no adnexal mass. Pulsed Doppler evaluation of both ovaries demonstrates normal low-resistance arterial and venous waveforms. Other findings Trace free pelvic fluid noted, likely physiologic. IMPRESSION: Normal pelvic ultrasound.  No evidence for adnexal mass or torsion. Electronically Signed   By: Nolon Nations M.D.   On: 01/13/2016 17:15     Procedures Procedures (including critical care time)  Medications Ordered in ED  Medications  doxycycline (VIBRAMYCIN) 100 mg in dextrose 5 % 250 mL IVPB (not administered)  cefOXitin (MEFOXIN) 2 g in dextrose 5 % 50 mL IVPB (not administered)  0.9 %  sodium chloride infusion (not administered)  HYDROmorphone (DILAUDID) injection 0.5 mg (0.5 mg Intravenous Given 01/13/16 1625)  ondansetron (ZOFRAN) injection 4 mg (4 mg Intravenous Given 01/13/16 1625)  sodium chloride 0.9 % bolus 1,000 mL (1,000 mLs Intravenous New Bag/Given 01/13/16 1625)  HYDROmorphone (DILAUDID) injection 1 mg (1 mg Intravenous Given 01/13/16 1734)  iopamidol (ISOVUE-300) 61 % injection 100 mL (80 mLs Intravenous Contrast Given 01/13/16 1917)  LORazepam (ATIVAN) injection 1 mg (1 mg Intravenous Given 01/13/16 1914)  HYDROmorphone (DILAUDID) injection 1 mg (1 mg Intravenous Given 01/13/16 2007)     Initial Impression / Assessment and Plan / ED Course  I have reviewed the triage vital signs and the  nursing notes.  Pertinent labs & imaging results that were available during my care of the patient were reviewed by me and considered in my medical decision making (see chart for details).       Clinical Course   Comment By Time  Patient is very uncomfortable, will first control pain. Given pelvic pain with vomiting of acute onset, will need to rule out ovarian torsion. Will perform pelvic exam and patient comfortable. Ultrasound ordered. If this is negative, may need CT of abdomen and pelvis. Patient's white blood cell count is 26,000. Carlisle Cater, PA-C 08/07 1617   Pelvic ultrasound negative. CT ordered.   Patient updated. Family aware.  Final Clinical Impressions(s) / ED Diagnoses   Final diagnoses:  Pelvic abscess in female  Non-intractable vomiting without nausea, vomiting of unspecified type  H/O drug and alcohol abuse Admit to Women's unit at [facility] Pain management IV antibiotic cefoxitin and doxycycline Urinalysis and drug screen     Carlisle Cater, PA-C 01/13/16 2248

## 2016-01-13 NOTE — ED Triage Notes (Signed)
Pt presented with sever abdominal pain. Pt is stated she "has never had pain like this". Pt denies blood in vomit or bloody stools.

## 2016-01-13 NOTE — ED Notes (Signed)
[facility] at bedside 

## 2016-01-14 ENCOUNTER — Encounter (HOSPITAL_COMMUNITY): Payer: Self-pay

## 2016-01-14 LAB — RAPID URINE DRUG SCREEN, HOSP PERFORMED
AMPHETAMINES: POSITIVE — AB
BARBITURATES: NOT DETECTED
BENZODIAZEPINES: NOT DETECTED
COCAINE: NOT DETECTED
OPIATES: POSITIVE — AB
TETRAHYDROCANNABINOL: NOT DETECTED

## 2016-01-14 LAB — URINALYSIS, ROUTINE W REFLEX MICROSCOPIC
Bilirubin Urine: NEGATIVE
GLUCOSE, UA: NEGATIVE mg/dL
HGB URINE DIPSTICK: NEGATIVE
Ketones, ur: 15 mg/dL — AB
LEUKOCYTES UA: NEGATIVE
Nitrite: NEGATIVE
PROTEIN: NEGATIVE mg/dL
SPECIFIC GRAVITY, URINE: 1.01 (ref 1.005–1.030)
pH: 5.5 (ref 5.0–8.0)

## 2016-01-14 LAB — GC/CHLAMYDIA PROBE AMP (~~LOC~~) NOT AT ARMC
Chlamydia: NEGATIVE
Neisseria Gonorrhea: NEGATIVE

## 2016-01-14 MED ORDER — LORAZEPAM 1 MG PO TABS: 1.0000 mg | ORAL_TABLET | Freq: Four times a day (QID) | ORAL | Status: DC | PRN

## 2016-01-14 MED ORDER — LORAZEPAM 2 MG/ML IJ SOLN
1.0000 mg | Freq: Four times a day (QID) | INTRAMUSCULAR | Status: DC | PRN
  Administered 2016-01-15 (×2): 1 mg via INTRAVENOUS
  Filled 2016-01-14 (×4): qty 0.5

## 2016-01-14 MED ORDER — FOLIC ACID 1 MG PO TABS
1.0000 mg | ORAL_TABLET | Freq: Every day | ORAL | Status: DC
  Administered 2016-01-16: 1 mg via ORAL
  Filled 2016-01-14 (×4): qty 1

## 2016-01-14 MED ORDER — THIAMINE HCL 100 MG/ML IJ SOLN
100.0000 mg | Freq: Every day | INTRAMUSCULAR | Status: DC
  Administered 2016-01-14: 100 mg via INTRAVENOUS
  Filled 2016-01-14 (×4): qty 1

## 2016-01-14 MED ORDER — ONDANSETRON HCL 4 MG/2ML IJ SOLN
4.0000 mg | Freq: Four times a day (QID) | INTRAMUSCULAR | Status: DC | PRN
  Administered 2016-01-14 – 2016-01-16 (×3): 4 mg via INTRAVENOUS
  Filled 2016-01-14 (×4): qty 2

## 2016-01-14 MED ORDER — HYDROMORPHONE HCL 1 MG/ML IJ SOLN
1.0000 mg | INTRAMUSCULAR | Status: DC | PRN
  Administered 2016-01-14 – 2016-01-16 (×11): 1 mg via INTRAVENOUS
  Filled 2016-01-14 (×13): qty 1

## 2016-01-14 MED ORDER — VITAMIN B-1 100 MG PO TABS
100.0000 mg | ORAL_TABLET | Freq: Every day | ORAL | Status: DC
  Administered 2016-01-16: 100 mg via ORAL
  Filled 2016-01-14 (×4): qty 1

## 2016-01-14 MED ORDER — LORAZEPAM 2 MG/ML IJ SOLN: 0.0000 mg | Freq: Two times a day (BID) | INTRAMUSCULAR | Status: DC

## 2016-01-14 MED ORDER — ADULT MULTIVITAMIN W/MINERALS CH
1.0000 | ORAL_TABLET | Freq: Every day | ORAL | Status: DC
  Administered 2016-01-16: 1 via ORAL
  Filled 2016-01-14 (×4): qty 1

## 2016-01-14 MED ORDER — LORAZEPAM 2 MG/ML IJ SOLN
0.0000 mg | Freq: Four times a day (QID) | INTRAMUSCULAR | Status: DC
  Filled 2016-01-14 (×5): qty 2

## 2016-01-15 LAB — CBC
HEMATOCRIT: 29.4 % — AB (ref 36.0–46.0)
HEMOGLOBIN: 9.6 g/dL — AB (ref 12.0–15.0)
MCH: 27 pg (ref 26.0–34.0)
MCHC: 32.7 g/dL (ref 30.0–36.0)
MCV: 82.8 fL (ref 78.0–100.0)
Platelets: 340 10*3/uL (ref 150–400)
RBC: 3.55 MIL/uL — ABNORMAL LOW (ref 3.87–5.11)
RDW: 14.7 % (ref 11.5–15.5)
WBC: 9.8 10*3/uL (ref 4.0–10.5)

## 2016-01-15 NOTE — Progress Notes (Signed)
Subjective: Patient reports improvement in her pain in comparison to admission. She is experiencing some nausea this morning but no emesis.    Objective: I have reviewed patient's vital signs. Blood pressure 127/81, pulse 69, temperature 98.7 F (37.1 C), temperature source Oral, resp. rate 18, height 5\' 1"  (1.549 m), weight 100 lb (45.4 kg), last menstrual period 12/30/2015, SpO2 100 %.  General: alert, cooperative and no distress Resp: clear to auscultation bilaterally Cardio: regular rate and rhythm GI: soft, non distended, voluntary guarding, no rebound. Tenderness localized to lower quadrants Extremities: extremities normal, atraumatic, no cyanosis or edema   Assessment/Plan: 30 yo with pelvic abscess - Patient has been afebrile since admission - Continue IV antibiotics - WBC now normal - discharge planning tomorrow after 48 hours of parenteral antibiotics - Continue current care   LOS: 2 days    Terrina Docter 01/15/2016, 7:36 AM

## 2016-01-15 NOTE — Progress Notes (Signed)
Ativan 0.70ml wasted in sink. Tayna Corbitt Rn wittnessed.

## 2016-01-15 NOTE — Progress Notes (Signed)
Ativan 0.24ml wasted in sink. Vashti Hey RN witnessed.

## 2016-01-16 DIAGNOSIS — N730 Acute parametritis and pelvic cellulitis: Principal | ICD-10-CM

## 2016-01-16 MED ORDER — METRONIDAZOLE 500 MG PO TABS: 500.0000 mg | ORAL_TABLET | Freq: Two times a day (BID) | ORAL | 0 refills | Status: DC

## 2016-01-16 MED ORDER — DOXYCYCLINE HYCLATE 100 MG PO CAPS: 100.0000 mg | ORAL_CAPSULE | Freq: Two times a day (BID) | ORAL | 0 refills | Status: DC

## 2016-01-16 MED ORDER — IBUPROFEN 800 MG PO TABS: 800.0000 mg | ORAL_TABLET | Freq: Three times a day (TID) | ORAL | 3 refills | Status: DC | PRN

## 2016-01-16 MED ORDER — PROMETHAZINE HCL 25 MG PO TABS
25.0000 mg | ORAL_TABLET | Freq: Four times a day (QID) | ORAL | 2 refills | Status: DC | PRN
Start: 2016-01-16 — End: 2017-01-24

## 2016-01-16 MED ORDER — OXYCODONE-ACETAMINOPHEN 5-325 MG PO TABS: 1.0000 | ORAL_TABLET | ORAL | 0 refills | Status: DC | PRN

## 2016-01-16 NOTE — Progress Notes (Signed)
Pt discharged to home with significant other.  Condition stable.  Pt ambulated to car with A. Lysbeth Galas, [place].  No equipment for home ordered at discharge.

## 2016-01-16 NOTE — Discharge Summary (Signed)
Physician Discharge Summary  Patient ID: Sherri Francis MRN: [id number] DOB/AGE: 12/14/1985 30 y.o.  Admit date: 01/13/2016 Discharge date: 01/16/2016  Admission Diagnoses:  Discharge Diagnoses:  Active Problems:   Pelvic abscess in female   PID (acute pelvic inflammatory disease)   Discharged Condition: stable  Hospital Course: Patient was admitted with pelvic abscess and PID. Received IV antibiotics and analgesia as needed, and her WBC was noted to decrease from 26 to 9.8.   She remained afebrile throughout her stay.  Pain control was difficult initially, was treated with Dilaudid and Toradol then transitioned to Percocet and Ibuprofen. On the day of discharge, she was deemed stable to go home and complete outpatient PID therapy.   Significant Diagnostic Studies:  Results for orders placed or performed during the hospital encounter of 01/13/16 (from the past 168 hour(s))  GC/Chlamydia probe amp   Collection Time: 01/13/16 12:00 AM  Result Value Ref Range   Chlamydia Negative    Neisseria gonorrhea Negative   Lipase, blood   Collection Time: 01/13/16  1:09 PM  Result Value Ref Range   Lipase 16 11 - 51 U/L  Comprehensive metabolic panel   Collection Time: 01/13/16  1:09 PM  Result Value Ref Range   Sodium 135 135 - 145 mmol/L   Potassium 3.5 3.5 - 5.1 mmol/L   Chloride 100 (L) 101 - 111 mmol/L   CO2 22 22 - 32 mmol/L   Glucose, Bld 120 (H) 65 - 99 mg/dL   BUN 9 6 - 20 mg/dL   Creatinine, Ser 0.43 (L) 0.44 - 1.00 mg/dL   Calcium 9.3 8.9 - 10.3 mg/dL   Total Protein 8.6 (H) 6.5 - 8.1 g/dL   Albumin 4.0 3.5 - 5.0 g/dL   AST 22 15 - 41 U/L   ALT 34 14 - 54 U/L   Alkaline Phosphatase 80 38 - 126 U/L   Total Bilirubin 1.0 0.3 - 1.2 mg/dL   GFR calc non Af Amer >60 >60 mL/min   GFR calc Af Amer >60 >60 mL/min   Anion gap 13 5 - 15  CBC   Collection Time: 01/13/16  1:09 PM  Result Value Ref Range   WBC 26.1 (H) 4.0 - 10.5 K/uL   RBC 4.65 3.87 - 5.11 MIL/uL   Hemoglobin 13.0  12.0 - 15.0 g/dL   HCT 38.4 36.0 - 46.0 %   MCV 82.6 78.0 - 100.0 fL   MCH 28.0 26.0 - 34.0 pg   MCHC 33.9 30.0 - 36.0 g/dL   RDW 14.5 11.5 - 15.5 %   Platelets 485 (H) 150 - 400 K/uL  I-Stat beta hCG blood, ED   Collection Time: 01/13/16  1:42 PM  Result Value Ref Range   I-stat hCG, quantitative <5.0 <5 mIU/mL   Comment 3          Wet prep, genital   Collection Time: 01/13/16  4:00 PM  Result Value Ref Range   Yeast Wet Prep HPF POC NONE SEEN NONE SEEN   Trich, Wet Prep NONE SEEN NONE SEEN   Clue Cells Wet Prep HPF POC NONE SEEN NONE SEEN   WBC, Wet Prep HPF POC FEW (A) NONE SEEN   Sperm NONE SEEN   Urine rapid drug screen (hosp performed)   Collection Time: 01/13/16 11:50 PM  Result Value Ref Range   Opiates POSITIVE (A) NONE DETECTED   Cocaine NONE DETECTED NONE DETECTED   Benzodiazepines NONE DETECTED NONE DETECTED   Amphetamines POSITIVE (A) NONE  DETECTED   Tetrahydrocannabinol NONE DETECTED NONE DETECTED   Barbiturates NONE DETECTED NONE DETECTED  Urinalysis, Routine w reflex microscopic (not at [facility])   Collection Time: 01/13/16 11:50 PM  Result Value Ref Range   Color, Urine YELLOW YELLOW   APPearance CLEAR CLEAR   Specific Gravity, Urine 1.010 1.005 - 1.030   pH 5.5 5.0 - 8.0   Glucose, UA NEGATIVE NEGATIVE mg/dL   Hgb urine dipstick NEGATIVE NEGATIVE   Bilirubin Urine NEGATIVE NEGATIVE   Ketones, ur 15 (A) NEGATIVE mg/dL   Protein, ur NEGATIVE NEGATIVE mg/dL   Nitrite NEGATIVE NEGATIVE   Leukocytes, UA NEGATIVE NEGATIVE  CBC   Collection Time: 01/15/16  7:04 AM  Result Value Ref Range   WBC 9.8 4.0 - 10.5 K/uL   RBC 3.55 (L) 3.87 - 5.11 MIL/uL   Hemoglobin 9.6 (L) 12.0 - 15.0 g/dL   HCT 29.4 (L) 36.0 - 46.0 %   MCV 82.8 78.0 - 100.0 fL   MCH 27.0 26.0 - 34.0 pg   MCHC 32.7 30.0 - 36.0 g/dL   RDW 14.7 11.5 - 15.5 %   Platelets 340 150 - 400 K/uL    US Transvaginal Non-ob  Result Date: 01/13/2016 CLINICAL DATA:  Bilateral pelvic pain, left greater  than right since 8:30 p.m. last night. Unknown LMP. EXAM: TRANSABDOMINAL AND TRANSVAGINAL ULTRASOUND OF PELVIS DOPPLER ULTRASOUND OF OVARIES TECHNIQUE: Both transabdominal and transvaginal ultrasound examinations of the pelvis were performed. Transabdominal technique was performed for global imaging of the pelvis including uterus, ovaries, adnexal regions, and pelvic cul-de-sac. It was necessary to proceed with endovaginal exam following the transabdominal exam to visualize the ovaries. Color and duplex Doppler ultrasound was utilized to evaluate blood flow to the ovaries. COMPARISON:  None. FINDINGS: Uterus Measurements: 6.4 x 3.0 x 4.3 cm. No fibroids or other mass visualized. Endometrium Thickness: 7.0 mm.  No focal abnormality visualized. Right ovary Measurements: 3.7 x 2.4 x 3.2 cm. Normal appearance/no adnexal mass. Left ovary Measurements: 2.2 x 1.5 x 1.8 cm. Normal appearance/no adnexal mass. Pulsed Doppler evaluation of both ovaries demonstrates normal low-resistance arterial and venous waveforms. Other findings Trace free pelvic fluid noted, likely physiologic. IMPRESSION: Normal pelvic ultrasound.  No evidence for adnexal mass or torsion. Electronically Signed   By: Nolon Nations M.D.   On: 01/13/2016 17:15   US Pelvis Complete  Result Date: 01/13/2016 CLINICAL DATA:  Bilateral pelvic pain, left greater than right since 8:30 p.m. last night. Unknown LMP. EXAM: TRANSABDOMINAL AND TRANSVAGINAL ULTRASOUND OF PELVIS DOPPLER ULTRASOUND OF OVARIES TECHNIQUE: Both transabdominal and transvaginal ultrasound examinations of the pelvis were performed. Transabdominal technique was performed for global imaging of the pelvis including uterus, ovaries, adnexal regions, and pelvic cul-de-sac. It was necessary to proceed with endovaginal exam following the transabdominal exam to visualize the ovaries. Color and duplex Doppler ultrasound was utilized to evaluate blood flow to the ovaries. COMPARISON:  None.  FINDINGS: Uterus Measurements: 6.4 x 3.0 x 4.3 cm. No fibroids or other mass visualized. Endometrium Thickness: 7.0 mm.  No focal abnormality visualized. Right ovary Measurements: 3.7 x 2.4 x 3.2 cm. Normal appearance/no adnexal mass. Left ovary Measurements: 2.2 x 1.5 x 1.8 cm. Normal appearance/no adnexal mass. Pulsed Doppler evaluation of both ovaries demonstrates normal low-resistance arterial and venous waveforms. Other findings Trace free pelvic fluid noted, likely physiologic. IMPRESSION: Normal pelvic ultrasound.  No evidence for adnexal mass or torsion. Electronically Signed   By: Nolon Nations M.D.   On: 01/13/2016 17:15  Ct Abdomen Pelvis W Contrast  Result Date: 01/13/2016 CLINICAL DATA:  Acute generalized abdominal pain. EXAM: CT ABDOMEN AND PELVIS WITH CONTRAST TECHNIQUE: Multidetector CT imaging of the abdomen and pelvis was performed using the standard protocol following bolus administration of intravenous contrast. CONTRAST:  15mL ISOVUE-300 IOPAMIDOL (ISOVUE-300) INJECTION 61% COMPARISON:  None. FINDINGS: Visualized lung bases are unremarkable. No significant osseous abnormality is noted. No gallstones are noted. The liver, spleen and pancreas unremarkable. Adrenal glands appear normal. Simple right renal cyst is noted. No hydronephrosis or renal obstruction is noted. Mildly dilated small bowel loops are noted in the left upper quadrant ; is uncertain if this is due to ileus or obstruction. 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 4 x 3 cm complex abnormality is noted in right adnexal region of unknown etiology. Inflammatory stranding is noted in the fat anteriorly in the pelvis. Urinary bladder is decompressed. No significant adenopathy is noted. IMPRESSION: Mildly dilated small bowel loops are noted in the left upper quadrant of the abdomen, and it is uncertain if this is due to ileus or obstruction. 4.5 cm dermoid tumor is noted in  left adnexal region. Pelvic ultrasound may be performed for further evaluation. Inflammatory stranding is noted in the fat anteriorly in the pelvis of unknown etiology. 4 x 3 cm complex mass is noted posteriorly in the right side of the pelvis ; etiology is unknown, but potentially may represent abscess or neoplasm. Further evaluation with pelvic ultrasound or MRI may be performed. Electronically Signed   By: Marijo Conception, M.D.   On: 01/13/2016 20:09   [place] Art/ven Flow Abd Pelv Doppler  Result Date: 01/13/2016 CLINICAL DATA:  Bilateral pelvic pain, left greater than right since 8:30 p.m. last night. Unknown LMP. EXAM: TRANSABDOMINAL AND TRANSVAGINAL ULTRASOUND OF PELVIS DOPPLER ULTRASOUND OF OVARIES TECHNIQUE: Both transabdominal and transvaginal ultrasound examinations of the pelvis were performed. Transabdominal technique was performed for global imaging of the pelvis including uterus, ovaries, adnexal regions, and pelvic cul-de-sac. It was necessary to proceed with endovaginal exam following the transabdominal exam to visualize the ovaries. Color and duplex Doppler ultrasound was utilized to evaluate blood flow to the ovaries. COMPARISON:  None. FINDINGS: Uterus Measurements: 6.4 x 3.0 x 4.3 cm. No fibroids or other mass visualized. Endometrium Thickness: 7.0 mm.  No focal abnormality visualized. Right ovary Measurements: 3.7 x 2.4 x 3.2 cm. Normal appearance/no adnexal mass. Left ovary Measurements: 2.2 x 1.5 x 1.8 cm. Normal appearance/no adnexal mass. Pulsed Doppler evaluation of both ovaries demonstrates normal low-resistance arterial and venous waveforms. Other findings Trace free pelvic fluid noted, likely physiologic. IMPRESSION: Normal pelvic ultrasound.  No evidence for adnexal mass or torsion. Electronically Signed   By: Nolon Nations M.D.   On: 01/13/2016 17:15   Discharge Exam: Blood pressure 114/63, pulse 72, temperature 97.9 F (36.6 C), temperature source Oral, resp. rate 16, height 5'  1" (1.549 m), weight 100 lb (45.4 kg), last menstrual period 12/30/2015, SpO2 99 %. General appearance: alert and no distress Resp: normal breath sounds, no difficulty breathing Cardio: regular rate and rhythm GI: soft, mildly tender in lower abdomen R>L; bowel sounds normal; no masses,  no organomegaly Pelvic: deferred Extremities: extremities normal, atraumatic, no cyanosis or edema and Homans sign is negative, no sign of DVT  Disposition: 01-Home or Self Care     Medication List    STOP taking these medications   amphetamine-dextroamphetamine 20 MG tablet Commonly  known as:  ADDERALL     TAKE these medications   doxycycline 100 MG capsule Commonly known as:  VIBRAMYCIN Take 1 capsule (100 mg total) by mouth 2 (two) times daily.   ibuprofen 800 MG tablet Commonly known as:  ADVIL,MOTRIN Take 1 tablet (800 mg total) by mouth 3 (three) times daily with meals as needed for headache or moderate pain.   metroNIDAZOLE 500 MG tablet Commonly known as:  FLAGYL Take 1 tablet (500 mg total) by mouth 2 (two) times daily.   oxyCODONE-acetaminophen 5-325 MG tablet Commonly known as:  PERCOCET/ROXICET Take 1-2 tablets by mouth every 4 (four) hours as needed for severe pain (moderate to severe pain (when tolerating fluids)).   promethazine 25 MG tablet Commonly known as:  PHENERGAN Take 1 tablet (25 mg total) by mouth every 6 (six) hours as needed for nausea or vomiting.      Follow-up Information    GYN provider of patient's choice. Schedule an appointment as soon as possible for a visit in 2 week(s).   Why:  Follow up PID          Signed: Osborne Oman, MD 01/16/2016, 1:40 PM

## 2016-01-16 NOTE — Discharge Instructions (Signed)
Pelvic Inflammatory Disease °Pelvic inflammatory disease (PID) refers to an infection in some or all of the female organs. The infection can be in the uterus, ovaries, fallopian tubes, or the surrounding tissues in the pelvis. PID can cause abdominal or pelvic pain that comes on suddenly (acute pelvic pain). PID is a serious infection because it can lead to lasting (chronic) pelvic pain or the inability to have children (infertility). °CAUSES °This condition is most often caused by an infection that is spread during sexual contact. However, the infection can also be caused by the normal bacteria that are found in the vaginal tissues if these bacteria travel upward into the reproductive organs. PID can also occur following: °· The birth of a baby. °· A miscarriage. °· An abortion. °· Major pelvic surgery. °· The use of an intrauterine device (IUD). °· A sexual assault. °RISK FACTORS °This condition is more likely to develop in women who: °· Are younger than 28 years of age. °· Are sexually active at a young age. °· Use nonbarrier contraception. °· Have multiple sexual partners. °· Have sex with someone who has symptoms of an STD (sexually transmitted disease). °· Use oral contraception. °At times, certain behaviors can also increase the possibility of getting PID, such as: °· Using a vaginal douche. °· Having an IUD in place. °SYMPTOMS °Symptoms of this condition include: °· Abdominal or pelvic pain. °· Fever. °· Chills. °· Abnormal vaginal discharge. °· Abnormal uterine bleeding. °· Unusual pain shortly after the end of a menstrual period. °· Painful urination. °· Pain with sexual intercourse. °· Nausea and vomiting. °DIAGNOSIS °To diagnose this condition, your health care provider will do a physical exam and take your medical history. A pelvic exam typically reveals great tenderness in the uterus and the surrounding pelvic tissues. You may also have tests, such as: °· Lab tests, including a pregnancy test, blood  tests, and urine test. °· Culture tests of the vagina and cervix to check for an STD. °· Ultrasound. °· A laparoscopic procedure to look inside the pelvis. °· Examining vaginal secretions under a microscope. °TREATMENT °Treatment for this condition may involve one or more approaches. °· Antibiotic medicines may be prescribed to be taken by mouth. °· Sexual partners may need to be treated if the infection is caused by an STD. °· For more severe cases, hospitalization may be needed to give antibiotics directly into a vein through an IV tube. °· Surgery may be needed if other treatments do not help, but this is rare. °It may take weeks until you are completely well. If you are diagnosed with PID, you should also be checked for human immunodeficiency virus (HIV). Your health care provider may test you for infection again 3 months after treatment. You should not have unprotected sex. °HOME CARE INSTRUCTIONS °· Take over-the-counter and prescription medicines only as told by your health care provider. °· If you were prescribed an antibiotic medicine, take it as told by your health care provider. Do not stop taking the antibiotic even if you start to feel better. °· Do not have sexual intercourse until treatment is completed or as told by your health care provider. If PID is confirmed, your recent sexual partners will need treatment, especially if you had unprotected sex. °· Keep all follow-up visits as told by your health care provider. This is important. °SEEK MEDICAL CARE IF: °· You have increased or abnormal vaginal discharge. °· Your pain does not improve. °· You vomit. °· You have a fever. °· You   cannot tolerate your medicines.  Your partner has an STD.  You have pain when you urinate. SEEK IMMEDIATE MEDICAL CARE IF:  You have increased abdominal or pelvic pain.  You have chills.  Your symptoms are not better in 72 hours even with treatment.   This information is not intended to replace advice given to  you by your health care provider. Make sure you discuss any questions you have with your health care provider.   Document Released: 05/25/2005 Document Revised: 02/13/2015 Document Reviewed: 07/02/2014 Elsevier Interactive Patient Education [employer].

## 2016-01-18 ENCOUNTER — Encounter (HOSPITAL_COMMUNITY): Payer: Self-pay

## 2016-01-18 ENCOUNTER — Inpatient Hospital Stay (HOSPITAL_COMMUNITY)
Admission: AD | Admit: 2016-01-18 | Discharge: 2016-01-18 | Disposition: A | Discharge status: Home / Self Care | Payer: Self-pay | Attending: Family Medicine | Admitting: Family Medicine

## 2016-01-18 DIAGNOSIS — F1193 Opioid use, unspecified with withdrawal: Secondary | ICD-10-CM

## 2016-01-18 DIAGNOSIS — Z9889 Other specified postprocedural states: Secondary | ICD-10-CM | POA: Insufficient documentation

## 2016-01-18 DIAGNOSIS — Z79899 Other long term (current) drug therapy: Secondary | ICD-10-CM | POA: Insufficient documentation

## 2016-01-18 DIAGNOSIS — F17210 Nicotine dependence, cigarettes, uncomplicated: Secondary | ICD-10-CM | POA: Insufficient documentation

## 2016-01-18 DIAGNOSIS — R112 Nausea with vomiting, unspecified: Secondary | ICD-10-CM | POA: Insufficient documentation

## 2016-01-18 DIAGNOSIS — E860 Dehydration: Secondary | ICD-10-CM | POA: Insufficient documentation

## 2016-01-18 DIAGNOSIS — F419 Anxiety disorder, unspecified: Secondary | ICD-10-CM | POA: Insufficient documentation

## 2016-01-18 DIAGNOSIS — F1123 Opioid dependence with withdrawal: Secondary | ICD-10-CM | POA: Insufficient documentation

## 2016-01-18 HISTORY — DX: Anxiety disorder, unspecified: F41.9

## 2016-01-18 HISTORY — DX: Major depressive disorder, single episode, unspecified: F32.9

## 2016-01-18 HISTORY — DX: Depression, unspecified: F32.A

## 2016-01-18 HISTORY — DX: Anemia, unspecified: D64.9

## 2016-01-18 LAB — URINALYSIS, ROUTINE W REFLEX MICROSCOPIC
Bilirubin Urine: NEGATIVE
GLUCOSE, UA: NEGATIVE mg/dL
Hgb urine dipstick: NEGATIVE
KETONES UR: 15 mg/dL — AB
LEUKOCYTES UA: NEGATIVE
Nitrite: NEGATIVE
PROTEIN: NEGATIVE mg/dL
Specific Gravity, Urine: 1.02 (ref 1.005–1.030)
pH: 6.5 (ref 5.0–8.0)

## 2016-01-18 LAB — COMPREHENSIVE METABOLIC PANEL
ALBUMIN: 3.9 g/dL (ref 3.5–5.0)
ALK PHOS: 67 U/L (ref 38–126)
ALT: 22 U/L (ref 14–54)
AST: 18 U/L (ref 15–41)
Anion gap: 10 (ref 5–15)
BUN: 12 mg/dL (ref 6–20)
CALCIUM: 9.3 mg/dL (ref 8.9–10.3)
CHLORIDE: 102 mmol/L (ref 101–111)
CO2: 26 mmol/L (ref 22–32)
CREATININE: 0.63 mg/dL (ref 0.44–1.00)
GFR calc Af Amer: >60 mL/min (ref >60)
GFR calc non Af Amer: >60 mL/min (ref >60)
GLUCOSE: 116 mg/dL — AB (ref 65–99)
Potassium: 3.1 mmol/L — ABNORMAL LOW (ref 3.5–5.1)
SODIUM: 138 mmol/L (ref 135–145)
Total Bilirubin: 0.6 mg/dL (ref 0.3–1.2)
Total Protein: 8.3 g/dL — ABNORMAL HIGH (ref 6.5–8.1)

## 2016-01-18 LAB — CBC
HCT: 36.6 % (ref 36.0–46.0)
HEMOGLOBIN: 12.7 g/dL (ref 12.0–15.0)
MCH: 28.1 pg (ref 26.0–34.0)
MCHC: 34.7 g/dL (ref 30.0–36.0)
MCV: 81 fL (ref 78.0–100.0)
PLATELETS: 582 10*3/uL — AB (ref 150–400)
RBC: 4.52 MIL/uL (ref 3.87–5.11)
RDW: 14.5 % (ref 11.5–15.5)
WBC: 15.4 10*3/uL — AB (ref 4.0–10.5)

## 2016-01-18 LAB — RAPID URINE DRUG SCREEN, HOSP PERFORMED
AMPHETAMINES: NOT DETECTED
BARBITURATES: NOT DETECTED
Benzodiazepines: NOT DETECTED
Cocaine: POSITIVE — AB
Opiates: POSITIVE — AB
Tetrahydrocannabinol: NOT DETECTED

## 2016-01-18 LAB — AMYLASE: Amylase: 88 U/L (ref 28–100)

## 2016-01-18 LAB — POCT PREGNANCY, URINE: Preg Test, Ur: NEGATIVE

## 2016-01-18 LAB — LIPASE, BLOOD: LIPASE: 14 U/L (ref 11–51)

## 2016-01-18 MED ORDER — LORAZEPAM 1 MG PO TABS
1.0000 mg | ORAL_TABLET | Freq: Once | ORAL | Status: AC
  Administered 2016-01-18: 1 mg via ORAL
  Filled 2016-01-18: qty 1

## 2016-01-18 MED ORDER — ONDANSETRON HCL 40 MG/20ML IJ SOLN
8.0000 mg | Freq: Once | INTRAMUSCULAR | Status: AC
  Administered 2016-01-18: 8 mg via INTRAVENOUS
  Filled 2016-01-18: qty 4

## 2016-01-18 MED ORDER — CLONIDINE HCL 0.1 MG PO TABS
0.1000 mg | ORAL_TABLET | Freq: Once | ORAL | Status: AC
  Administered 2016-01-18: 0.1 mg via ORAL
  Filled 2016-01-18: qty 1

## 2016-01-18 MED ORDER — LACTATED RINGERS IV BOLUS (SEPSIS)
1000.0000 mL | Freq: Once | INTRAVENOUS | Status: AC
  Administered 2016-01-18: 1000 mL via INTRAVENOUS

## 2016-01-18 MED ORDER — HYDROXYZINE HCL 50 MG/ML IM SOLN
50.0000 mg | Freq: Once | INTRAMUSCULAR | Status: AC
  Administered 2016-01-18: 50 mg via INTRAMUSCULAR
  Filled 2016-01-18: qty 1

## 2016-01-18 NOTE — MAU Provider Note (Signed)
History     CSN: [id number]  Arrival date and time: 01/18/16 0222   None     Chief Complaint  Patient presents with  . Emesis  . Abdominal Pain   Non-pregnant female c/o acute onset of N/V since 11am yesterday. She in unable to tolerate anything po. She reports 30+ episodes of vomiting. She denies fever but reports feeling hot and cold. She denies abdominal pain. She was recently admitted to the hospital for a pelvic abscess and PID and was discharge 2 days ago. She reports hx of anxiety and worsening since onset of N/V.    Pertinent Gynecological History: Menses: 12/30/15  No past medical history on file.  Past Surgical History:  Procedure Laterality Date  . LAPAROSCOPY     3 weeks ago    No family history on file.  Social History  Substance Use Topics  . Smoking status: Current Every Day Smoker    Types: Cigarettes  . Smokeless tobacco: Never Used  . Alcohol use Yes     Comment: 6 beers perday    Allergies: No Known Allergies  Prescriptions Prior to Admission  Medication Sig Dispense Refill Last Dose  . promethazine (PHENERGAN) 25 MG tablet Take 1 tablet (25 mg total) by mouth every 6 (six) hours as needed for nausea or vomiting. 30 tablet 2 01/17/2016 at Unknown time  . doxycycline (VIBRAMYCIN) 100 MG capsule Take 1 capsule (100 mg total) by mouth 2 (two) times daily. 20 capsule 0   . ibuprofen (ADVIL,MOTRIN) 800 MG tablet Take 1 tablet (800 mg total) by mouth 3 (three) times daily with meals as needed for headache or moderate pain. 30 tablet 3   . metroNIDAZOLE (FLAGYL) 500 MG tablet Take 1 tablet (500 mg total) by mouth 2 (two) times daily. 20 tablet 0   . oxyCODONE-acetaminophen (PERCOCET/ROXICET) 5-325 MG tablet Take 1-2 tablets by mouth every 4 (four) hours as needed for severe pain (moderate to severe pain (when tolerating fluids)). 30 tablet 0     Review of Systems  Constitutional: Negative.   Cardiovascular: Negative.   Gastrointestinal: Positive for  constipation, nausea and vomiting.  Neurological: Negative.    Physical Exam   Blood pressure 148/91, pulse 89, temperature 98.9 F (37.2 C), temperature source Oral, resp. rate 20, last menstrual period 12/30/2015, SpO2 100 %.  Physical Exam  Constitutional: She is oriented to person, place, and time. She appears well-developed and well-nourished.  restless  HENT:  Head: Normocephalic and atraumatic.  Neck: Normal range of motion. Neck supple.  Cardiovascular: Normal rate and regular rhythm.   Respiratory: Effort normal and breath sounds normal.  GI: Soft. Bowel sounds are normal. She exhibits no distension and no mass. There is generalized tenderness. There is no rebound and no guarding.  Musculoskeletal: Normal range of motion.  Neurological: She is oriented to person, place, and time.  Skin: Skin is warm and dry.  Psychiatric: Judgment normal. Her mood appears anxious (highly). Cognition and memory are normal.   Results for orders placed or performed during the hospital encounter of 01/18/16 (from the past 24 hour(s))  Urinalysis, Routine w reflex microscopic (not at [facility])     Status: Abnormal   Collection Time: 01/18/16  2:45 AM  Result Value Ref Range   Color, Urine YELLOW YELLOW   APPearance CLEAR CLEAR   Specific Gravity, Urine 1.020 1.005 - 1.030   pH 6.5 5.0 - 8.0   Glucose, UA NEGATIVE NEGATIVE mg/dL   Hgb urine dipstick NEGATIVE NEGATIVE  Bilirubin Urine NEGATIVE NEGATIVE   Ketones, ur 15 (A) NEGATIVE mg/dL   Protein, ur NEGATIVE NEGATIVE mg/dL   Nitrite NEGATIVE NEGATIVE   Leukocytes, UA NEGATIVE NEGATIVE  Urine rapid drug screen (hosp performed)     Status: Abnormal   Collection Time: 01/18/16  2:45 AM  Result Value Ref Range   Opiates POSITIVE (A) NONE DETECTED   Cocaine POSITIVE (A) NONE DETECTED   Benzodiazepines NONE DETECTED NONE DETECTED   Amphetamines NONE DETECTED NONE DETECTED   Tetrahydrocannabinol NONE DETECTED NONE DETECTED   Barbiturates NONE  DETECTED NONE DETECTED  Pregnancy, urine POC     Status: None   Collection Time: 01/18/16  2:55 AM  Result Value Ref Range   Preg Test, Ur NEGATIVE NEGATIVE  CBC     Status: Abnormal   Collection Time: 01/18/16  3:51 AM  Result Value Ref Range   WBC 15.4 (H) 4.0 - 10.5 K/uL   RBC 4.52 3.87 - 5.11 MIL/uL   Hemoglobin 12.7 12.0 - 15.0 g/dL   HCT 36.6 36.0 - 46.0 %   MCV 81.0 78.0 - 100.0 fL   MCH 28.1 26.0 - 34.0 pg   MCHC 34.7 30.0 - 36.0 g/dL   RDW 14.5 11.5 - 15.5 %   Platelets 582 (H) 150 - 400 K/uL  Comprehensive metabolic panel     Status: Abnormal   Collection Time: 01/18/16  3:51 AM  Result Value Ref Range   Sodium 138 135 - 145 mmol/L   Potassium 3.1 (L) 3.5 - 5.1 mmol/L   Chloride 102 101 - 111 mmol/L   CO2 26 22 - 32 mmol/L   Glucose, Bld 116 (H) 65 - 99 mg/dL   BUN 12 6 - 20 mg/dL   Creatinine, Ser 0.63 0.44 - 1.00 mg/dL   Calcium 9.3 8.9 - 10.3 mg/dL   Total Protein 8.3 (H) 6.5 - 8.1 g/dL   Albumin 3.9 3.5 - 5.0 g/dL   AST 18 15 - 41 U/L   ALT 22 14 - 54 U/L   Alkaline Phosphatase 67 38 - 126 U/L   Total Bilirubin 0.6 0.3 - 1.2 mg/dL   GFR calc non Af Amer >60 >60 mL/min   GFR calc Af Amer >60 >60 mL/min   Anion gap 10 5 - 15  Amylase     Status: None   Collection Time: 01/18/16  3:51 AM  Result Value Ref Range   Amylase 88 28 - 100 U/L  Lipase, blood     Status: None   Collection Time: 01/18/16  3:51 AM  Result Value Ref Range   Lipase 14 11 - 51 U/L    MAU Course  Procedures IV LR bolus Zofran 8 mg IV Vistaril 50 mg IM Ativan 1 mg po Clonidine 1mg  po Po challenge  MDM Labs ordered and reviewed. Pt continues to be listless and reporting the anxiety meds not working. Tolerating po w/o problem. Discussed findings with Dr. Kennon Rounds. Clonidine ordered. No episodes of vomiting. No evidence of acute GI process. No evidence of acute infection, CBC elevations compared to 3 days ago suggest hemoconcentration. 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 She explains that her urine was contaminated with cocaine d/t boyfriend using and maybe it got under her nails and she bites them. I discussed with her that her sx are likely withdrawal from opiates and  she needs outpatient treatment program as we do not manage this in the ED. She was given information for oupt mngt. She is stable for discharge home.  Assessment and Plan   1. Non-intractable vomiting with nausea, vomiting of unspecified type   2. Acute narcotic withdrawal (HCC)   3.      Dehydration  Discharge home Continue Flagyl, Doxycycline, Phenergan, and Ibuprofen as previous prescribed; no new Rx's Follow up in 2 weeks with Gyn provider of choice Follow up with outpatient drug dependency clinic   [facility], CNM 01/18/2016, 3:12 AM

## 2016-01-18 NOTE — Progress Notes (Signed)
Pt has not been able to lie still since arrival.  States " I need that Ativan, you don't understand".  Have explained to give Vistaril some time to work.  Requesting Sprite- ok with Melanie CNM - Sprite given and pt drank all of Sprite.

## 2016-01-18 NOTE — Discharge Instructions (Signed)
Chemical Dependency °Chemical dependency is an addiction to drugs or alcohol. It is characterized by the repeated behavior of seeking out and using drugs and alcohol despite harmful consequences to the health and safety of ones self and others.  °RISK FACTORS °There are certain situations or behaviors that increase a person's risk for chemical dependency. These include: °· A family history of chemical dependency. °· A history of mental health issues, including depression and anxiety. °· A home environment where drugs and alcohol are easily available to you. °· Drug or alcohol use at a young age. °SYMPTOMS  °The following symptoms can indicate chemical dependency: °· Inability to limit the use of drugs or alcohol. °· Nausea, sweating, shakiness, and anxiety that occurs when alcohol or drugs are not being used. °· An increase in amount of drugs or alcohol that is necessary to get drunk or high. °People who experience these symptoms can assess their use of drugs and alcohol by asking themselves the following questions: °· Have you been told by friends or family that they are worried about your use of alcohol or drugs? °· Do friends and family ever tell you about things you did while drinking alcohol or using drugs that you do not remember? °· Do you lie about using alcohol or drugs or about the amounts you use? °· Do you have difficulty completing daily tasks unless you use alcohol or drugs? °· Is the level of your work or school performance lower because of your drug or alcohol use? °· Do you get sick from using drugs or alcohol but keep using anyway? °· Do you feel uncomfortable in social situations unless you use alcohol or drugs? °· Do you use drugs or alcohol to help forget problems?  °An answer of yes to any of these questions may indicate chemical dependency. Professional evaluation is suggested. °  °This information is not intended to replace advice given to you by your health care provider. Make sure you  discuss any questions you have with your health care provider. °  °Document Released: 05/19/2001 Document Revised: 08/17/2011 Document Reviewed: 07/31/2010 °Elsevier Interactive Patient Education ©2016 [employer] ° °

## 2016-01-18 NOTE — Progress Notes (Signed)
Pt states the Vistaril is making her restless leg syndrome worse.  Pt laying on her side moving her legs.  Told her I would let her provider know. Notified Melanie CNM.  Additional pillow and blanket given to pt for comfort.

## 2016-01-18 NOTE — Progress Notes (Signed)
Pt requesting Ativan instead of Vistaril - states she doesn't want Vistaril and states was given Ativan previously in the hospital and that she has had a panic disorder since she was 30 years old. Notified Julianne Handler CNM, no new orders received.  Went back into room and let pt know that I spoke with provider and that she would like to start with Vistaril, pt agreed to Vistaril injection as ordered.

## 2016-01-18 NOTE — MAU Note (Signed)
WAS HERE FOR A WEEK, DC'D ON Thursday, SENT HOME WITH ANTIBIOTICS AND PAIN MED AND CAN'T KEEP ANYTHING DOWN. NO VAG BLEEDING.

## 2016-01-18 NOTE — Progress Notes (Signed)
Pt occas cursing, states she feels like she is "going through withdrawal" states "was getting Dilaudid every 2 hours before and needs that Ativan". Rocking in bed, she turned all lights off. Says she is nauseated again and says she doesn't understand why she was given an Ativan pill.  No vomiting.  Offered verbal support.

## 2016-02-10 ENCOUNTER — Telehealth: Payer: Self-pay | Admitting: Certified Nurse Midwife

## 2016-02-10 NOTE — Telephone Encounter (Signed)
Opened in error

## 2017-01-24 ENCOUNTER — Emergency Department (HOSPITAL_COMMUNITY): Payer: Self-pay

## 2017-01-24 ENCOUNTER — Inpatient Hospital Stay (HOSPITAL_COMMUNITY)
Admission: EM | Admit: 2017-01-24 | Discharge: 2017-01-24 | DRG: 871 | Discharge status: Against Medical Advice | Payer: Self-pay | Attending: Internal Medicine | Admitting: Internal Medicine

## 2017-01-24 ENCOUNTER — Encounter (HOSPITAL_COMMUNITY): Payer: Self-pay | Admitting: *Deleted

## 2017-01-24 DIAGNOSIS — E871 Hypo-osmolality and hyponatremia: Secondary | ICD-10-CM | POA: Diagnosis present

## 2017-01-24 DIAGNOSIS — J181 Lobar pneumonia, unspecified organism: Secondary | ICD-10-CM

## 2017-01-24 DIAGNOSIS — R7881 Bacteremia: Secondary | ICD-10-CM

## 2017-01-24 DIAGNOSIS — F1123 Opioid dependence with withdrawal: Secondary | ICD-10-CM | POA: Diagnosis present

## 2017-01-24 DIAGNOSIS — J189 Pneumonia, unspecified organism: Secondary | ICD-10-CM

## 2017-01-24 DIAGNOSIS — E876 Hypokalemia: Secondary | ICD-10-CM

## 2017-01-24 DIAGNOSIS — A4101 Sepsis due to Methicillin susceptible Staphylococcus aureus: Principal | ICD-10-CM | POA: Diagnosis present

## 2017-01-24 DIAGNOSIS — F17210 Nicotine dependence, cigarettes, uncomplicated: Secondary | ICD-10-CM | POA: Diagnosis present

## 2017-01-24 LAB — CBC WITH DIFFERENTIAL/PLATELET
Basophils Absolute: 0 10*3/uL (ref 0.0–0.1)
Basophils Relative: 0 %
Eosinophils Absolute: 0.1 10*3/uL (ref 0.0–0.7)
Eosinophils Relative: 0 %
HCT: 31.6 % — ABNORMAL LOW (ref 36.0–46.0)
HEMOGLOBIN: 10.8 g/dL — AB (ref 12.0–15.0)
LYMPHS ABS: 1 10*3/uL (ref 0.7–4.0)
LYMPHS PCT: 6 %
MCH: 27.1 pg (ref 26.0–34.0)
MCHC: 34.2 g/dL (ref 30.0–36.0)
MCV: 79.4 fL (ref 78.0–100.0)
Monocytes Absolute: 1.9 10*3/uL — ABNORMAL HIGH (ref 0.1–1.0)
Monocytes Relative: 12 %
NEUTROS PCT: 82 %
Neutro Abs: 12.8 10*3/uL — ABNORMAL HIGH (ref 1.7–7.7)
Platelets: 121 10*3/uL — ABNORMAL LOW (ref 150–400)
RBC: 3.98 MIL/uL (ref 3.87–5.11)
RDW: 14.9 % (ref 11.5–15.5)
WBC: 15.8 10*3/uL — AB (ref 4.0–10.5)

## 2017-01-24 LAB — COMPREHENSIVE METABOLIC PANEL
ALT: 29 U/L (ref 14–54)
AST: 37 U/L (ref 15–41)
Albumin: 2.8 g/dL — ABNORMAL LOW (ref 3.5–5.0)
Alkaline Phosphatase: 89 U/L (ref 38–126)
Anion gap: 10 (ref 5–15)
BUN: 9 mg/dL (ref 6–20)
CHLORIDE: 96 mmol/L — AB (ref 101–111)
CO2: 25 mmol/L (ref 22–32)
Calcium: 8 mg/dL — ABNORMAL LOW (ref 8.9–10.3)
Creatinine, Ser: 0.53 mg/dL (ref 0.44–1.00)
Glucose, Bld: 125 mg/dL — ABNORMAL HIGH (ref 65–99)
POTASSIUM: 2.9 mmol/L — AB (ref 3.5–5.1)
Sodium: 131 mmol/L — ABNORMAL LOW (ref 135–145)
Total Bilirubin: 0.7 mg/dL (ref 0.3–1.2)
Total Protein: 7 g/dL (ref 6.5–8.1)

## 2017-01-24 LAB — URINALYSIS, ROUTINE W REFLEX MICROSCOPIC
BILIRUBIN URINE: NEGATIVE
GLUCOSE, UA: NEGATIVE mg/dL
KETONES UR: NEGATIVE mg/dL
Nitrite: POSITIVE — AB
PH: 6 (ref 5.0–8.0)
Protein, ur: 30 mg/dL — AB
Specific Gravity, Urine: 1.013 (ref 1.005–1.030)

## 2017-01-24 LAB — MAGNESIUM: Magnesium: 2.1 mg/dL (ref 1.7–2.4)

## 2017-01-24 LAB — LIPASE, BLOOD: LIPASE: 24 U/L (ref 11–51)

## 2017-01-24 LAB — MRSA PCR SCREENING: MRSA by PCR: NEGATIVE

## 2017-01-24 LAB — I-STAT CG4 LACTIC ACID, ED: LACTIC ACID, VENOUS: 1.08 mmol/L (ref 0.5–1.9)

## 2017-01-24 LAB — POC URINE PREG, ED: PREG TEST UR: NEGATIVE

## 2017-01-24 MED ORDER — IBUPROFEN 200 MG PO TABS
400.0000 mg | ORAL_TABLET | Freq: Four times a day (QID) | ORAL | Status: DC | PRN
  Administered 2017-01-24: 400 mg via ORAL
  Filled 2017-01-24: qty 2

## 2017-01-24 MED ORDER — HYDROXYZINE HCL 25 MG PO TABS: 25.0000 mg | ORAL_TABLET | Freq: Four times a day (QID) | ORAL | Status: DC | PRN

## 2017-01-24 MED ORDER — SODIUM CHLORIDE 0.9 % IV BOLUS (SEPSIS)
1000.0000 mL | Freq: Once | INTRAVENOUS | Status: AC
  Administered 2017-01-24: 1000 mL via INTRAVENOUS

## 2017-01-24 MED ORDER — ACETAMINOPHEN 325 MG PO TABS: 650.0000 mg | ORAL_TABLET | Freq: Four times a day (QID) | ORAL | Status: DC | PRN

## 2017-01-24 MED ORDER — METHOCARBAMOL 500 MG PO TABS: 500.0000 mg | ORAL_TABLET | Freq: Three times a day (TID) | ORAL | Status: DC | PRN

## 2017-01-24 MED ORDER — DICYCLOMINE HCL 20 MG PO TABS
20.0000 mg | ORAL_TABLET | Freq: Four times a day (QID) | ORAL | Status: DC | PRN
  Filled 2017-01-24: qty 1

## 2017-01-24 MED ORDER — AZITHROMYCIN 500 MG IV SOLR
500.0000 mg | Freq: Once | INTRAVENOUS | Status: AC
  Administered 2017-01-24: 500 mg via INTRAVENOUS
  Filled 2017-01-24: qty 500

## 2017-01-24 MED ORDER — SODIUM CHLORIDE 0.9 % IV SOLN
INTRAVENOUS | Status: DC
  Administered 2017-01-24: 12:00:00 via INTRAVENOUS

## 2017-01-24 MED ORDER — CEFTRIAXONE SODIUM 1 G IJ SOLR
1.0000 g | Freq: Once | INTRAMUSCULAR | Status: AC
  Administered 2017-01-24: 1 g via INTRAVENOUS
  Filled 2017-01-24: qty 10

## 2017-01-24 MED ORDER — POTASSIUM CHLORIDE CRYS ER 20 MEQ PO TBCR: 40.0000 meq | EXTENDED_RELEASE_TABLET | Freq: Three times a day (TID) | ORAL | Status: DC

## 2017-01-24 MED ORDER — IBUPROFEN 200 MG PO TABS
400.0000 mg | ORAL_TABLET | Freq: Once | ORAL | Status: AC
  Administered 2017-01-24: 400 mg via ORAL
  Filled 2017-01-24: qty 2

## 2017-01-24 MED ORDER — NAPROXEN 500 MG PO TABS: 500.0000 mg | ORAL_TABLET | Freq: Two times a day (BID) | ORAL | Status: DC | PRN

## 2017-01-24 MED ORDER — POTASSIUM CHLORIDE CRYS ER 20 MEQ PO TBCR
40.0000 meq | EXTENDED_RELEASE_TABLET | Freq: Once | ORAL | Status: AC
  Administered 2017-01-24: 40 meq via ORAL
  Filled 2017-01-24: qty 2

## 2017-01-24 MED ORDER — VANCOMYCIN HCL IN DEXTROSE 1-5 GM/200ML-% IV SOLN
1000.0000 mg | INTRAVENOUS | Status: AC
  Administered 2017-01-24: 1000 mg via INTRAVENOUS
  Filled 2017-01-24: qty 200

## 2017-01-24 MED ORDER — LOPERAMIDE HCL 2 MG PO CAPS: 2.0000 mg | ORAL_CAPSULE | ORAL | Status: DC | PRN

## 2017-01-24 MED ORDER — ENOXAPARIN SODIUM 40 MG/0.4ML ~~LOC~~ SOLN: 40.0000 mg | SUBCUTANEOUS | Status: DC

## 2017-01-24 MED ORDER — DEXTROSE 5 % IV SOLN
1.0000 g | Freq: Three times a day (TID) | INTRAVENOUS | Status: DC
  Filled 2017-01-24: qty 1

## 2017-01-24 MED ORDER — DEXTROSE 5 % IV SOLN
2.0000 g | Freq: Once | INTRAVENOUS | Status: AC
  Administered 2017-01-24: 2 g via INTRAVENOUS
  Filled 2017-01-24: qty 2

## 2017-01-24 MED ORDER — ONDANSETRON 4 MG PO TBDP: 4.0000 mg | ORAL_TABLET | Freq: Four times a day (QID) | ORAL | Status: DC | PRN

## 2017-01-24 MED ORDER — FENTANYL CITRATE (PF) 100 MCG/2ML IJ SOLN
100.0000 ug | Freq: Once | INTRAMUSCULAR | Status: AC
  Administered 2017-01-24: 100 ug via INTRAVENOUS
  Filled 2017-01-24: qty 2

## 2017-01-24 NOTE — H&P (Addendum)
History and Physical    Sherri Francis FFM:[id number] DOB: [birth year] DOA: 01/24/2017  PCP: Ardelle Anton, MD Patient coming from: home   C hief Complaint: Fever and cough  HPI: Sherri Francis is a 31 y.o. female with medical history significant of IV drug abuse with heroin the last time she used heroin was yesterday. She has been having fever chills cough congestion and shortness of breath for the last few days she also complains of pleuritic chest pain. She denies any nausea vomiting diarrhea. She has complaints of headache no changes with her vision.    ED Course: In the ER she had a chest x-ray showed consistent with bilateral pneumonia she was given Rocephin as the mycin and vancomycin. In the ER she was also found to have a potassium of 2.9 and white count of 15.8. She is tachypneic and tachycardic in the ER her lactate level was normal.  Review of Systems: As per HPI otherwise all other systems reviewed and are negative  Ambulatory Status  Past Medical History:  Diagnosis Date  . Anemia   . Anxiety   . Depression     Past Surgical History:  Procedure Laterality Date  . LAPAROSCOPY     3 weeks ago    Social History   Social History  . Marital status: Single    Spouse name: N/A  . Number of children: N/A  . Years of education: N/A   Occupational History  . Not on file.   Social History Main Topics  . Smoking status: Current Every Day Smoker    Types: Cigarettes  . Smokeless tobacco: Never Used  . Alcohol use Yes     Comment: 6 beers perday- 2 years ago was last time hx of alcoholism per pt.  . Drug use: Yes    Types: IV     Comment: heroin- over a year ago was last time per pt.   . Sexual activity: Not on file   Other Topics Concern  . Not on file   Social History Narrative  . No narrative on file    No Known Allergies  No family history on file.    Prior to Admission medications   Medication Sig Start Date End Date Taking? Authorizing Provider   Phenyleph-Doxylamine-DM-APAP (NYQUIL SEVERE COLD/FLU) 5-6.25-10-325 MG/15ML LIQD Take 30 mLs by mouth daily as needed (for cough/congestion).   Yes [provider]    Physical Exam: Vitals:   01/24/17 0819 01/24/17 0835 01/24/17 0900 01/24/17 0930  BP: 116/68 102/64 109/71 120/82  Pulse: (!) 110 (!) 108 (!) 109 (!) 116  Resp: 20 (!) 26 (!) 26 (!) 29  Temp: 99.4 F (37.4 C)     TempSrc: Oral     SpO2: 93% 94% 98% 96%     General: Appears calm and comfortable Eyes:  PERRL, EOMI, normal lids, iris ENT:  grossly normal hearing, lips & tongue, mmm Neck:  no LAD, masses or thyromegaly Cardiovascular:  RRR, no m/r/g. No LE edema.  Respiratory: CTA bilaterally, no w/r/r. Normal respiratory effort. Abdomen:  soft, ntnd, NABS Skin:  Track marks seen in both hands Musculoskeletal:  grossly normal tone BUE/BLE, good ROM, no bony abnormality Psychiatric:  grossly normal mood and affect, speech fluent and appropriate, AOx3 Neurologic:  CN 2-12 grossly intact, moves all extremities in coordinated fashion, sensation intact  Labs on Admission: I have personally reviewed following labs and imaging studies  CBC:  Recent Labs Lab 01/24/17 0823  WBC 15.8*  NEUTROABS 12.8*  HGB 10.8*  HCT 31.6*  MCV 79.4  PLT 242*   Basic Metabolic Panel:  Recent Labs Lab 01/24/17 0823 01/24/17 0917  NA 131*  --   K 2.9*  --   CL 96*  --   CO2 25  --   GLUCOSE 125*  --   BUN 9  --   CREATININE 0.53  --   CALCIUM 8.0*  --   MG  --  2.1   GFR: CrCl cannot be calculated (Unknown ideal weight.). Liver Function Tests:  Recent Labs Lab 01/24/17 0823  AST 37  ALT 29  ALKPHOS 89  BILITOT 0.7  PROT 7.0  ALBUMIN 2.8*    Recent Labs Lab 01/24/17 0917  LIPASE 24   No results for input(s): AMMONIA in the last 168 hours. Coagulation Profile: No results for input(s): INR, PROTIME in the last 168 hours. Cardiac Enzymes: No results for input(s): CKTOTAL, CKMB, CKMBINDEX,  TROPONINI in the last 168 hours. BNP (last 3 results) No results for input(s): PROBNP in the last 8760 hours. HbA1C: No results for input(s): HGBA1C in the last 72 hours. CBG: No results for input(s): GLUCAP in the last 168 hours. Lipid Profile: No results for input(s): CHOL, HDL, LDLCALC, TRIG, CHOLHDL, LDLDIRECT in the last 72 hours. Thyroid Function Tests: No results for input(s): TSH, T4TOTAL, FREET4, T3FREE, THYROIDAB in the last 72 hours. Anemia Panel: No results for input(s): VITAMINB12, FOLATE, FERRITIN, TIBC, IRON, RETICCTPCT in the last 72 hours. Urine analysis:    Component Value Date/Time   COLORURINE YELLOW 01/24/2017 0938   APPEARANCEUR HAZY (A) 01/24/2017 0938   LABSPEC 1.013 01/24/2017 0938   PHURINE 6.0 01/24/2017 0938   GLUCOSEU NEGATIVE 01/24/2017 0938   HGBUR MODERATE (A) 01/24/2017 0938   BILIRUBINUR NEGATIVE 01/24/2017 0938   KETONESUR NEGATIVE 01/24/2017 0938   PROTEINUR 30 (A) 01/24/2017 0938   UROBILINOGEN 0.2 10/15/2012 1341   NITRITE POSITIVE (A) 01/24/2017 0938   LEUKOCYTESUR MODERATE (A) 01/24/2017 0938    Creatinine Clearance: CrCl cannot be calculated (Unknown ideal weight.).  Sepsis Labs: @LABRCNTIP (procalcitonin:4,lacticidven:4) )No results found for this or any previous visit (from the past 240 hour(s)).   Radiological Exams on Admission: Dg Chest 2 View  Result Date: 01/24/2017 CLINICAL DATA:  Fever EXAM: CHEST  2 VIEW COMPARISON:  None. FINDINGS: Patchy opacity at the left base and right perihilar/ basal lung. No edema, effusion, or pneumothorax. Normal heart size. No acute osseous finding. IMPRESSION: Bilateral pneumonia. Electronically Signed   By: Monte Fantasia M.D.   On: 01/24/2017 08:18    EKG: Independently reviewed.   Assessment/Plan Active Problems:   Pneumonia   Bilateral pneumonia with tachypnea and tachycardia and leukocytosis in a patient with history of IV drug abuse patient received Rocephin and azithromycin and  vancomycin in the ER. I will treat her with cefepime and vancomycin. This should cover the most common pathogens causing community-acquired pneumonia in patients with IV drug use. I will obtain blood cultures 2.  Hypokalemia probably nutritional replete potassium, and recheck levels in the morning.  Hyponatremia also probably nutritional patient will be receiving IV normal saline and follow-up levels tomorrow.   Leukocytosis secondary to bilateral pneumonia continue antibiotics and follow-up tomorrow.  Heroin withdrawal protocol.psych consult called in.will see her in am.       DVT prophylaxis:lovenox Code Status: full Family Communication: Disposition Plan: Consults called:  Admission status: inpatient   Georgette Shell MD [facility]  If 7PM-7AM, please contact night-coverage [email or web address] Password [email or web address]  01/24/2017, 11:11 AM  I

## 2017-01-24 NOTE — ED Triage Notes (Signed)
Pt states that she has had a fever x 3 days. Hx of IV drug use. Track marks noted on arms with wounds. Alert and oriented. Hx of hep C.

## 2017-01-24 NOTE — Progress Notes (Signed)
A consult was received from an ED physician for Vancomycin per pharmacy dosing.  The patient's profile has been reviewed for ht/wt/allergies/indication/available labs.   A one time order has been placed for Vancomycin 1gm IV.  Further antibiotics/pharmacy consults should be ordered by admitting physician if indicated.                       Thank you, Leone Haven, PharmD 01/24/17 @ 09:23

## 2017-01-24 NOTE — Progress Notes (Signed)
Patient left unit through back stairwell, NT followed pt and found pt outside.  Pt states she is leaving and refusing to come back upstairs to the unit.  RN spoke with pt and reiterated that patient is sick and needs medical treatment.  Pt stated "I KNOW" "I am leaving".   Rn provided pt with AMA papers to sign.  Pt stated she has a ride coming.

## 2017-01-24 NOTE — ED Notes (Signed)
Bed: CH40 Expected date:  Expected time:  Means of arrival:  Comments: EMS/heroin user/fever

## 2017-01-24 NOTE — ED Provider Notes (Signed)
[place] DEPT Provider Note   CSN: [id number] Arrival date & time: 01/24/17  0750     History   Chief Complaint Chief Complaint  Patient presents with  . Fever    HPI Sherri Francis is a 31 y.o. female.  HPI  31 year old female with a history of IV drug abuse and hepatitis C, most recently using heroin yesterday presents with cough and chest pain. She states that last for 5 days she's been having a cough, congestion, and shortness of breath. She states that she is unable to cough much because it hurts too much. It hurts both on the right and the left side. She's having all over aches and feels like she has the flu. She's had a subjective fever during this time as well. Currently her pain is severe. She's been having rhinorrhea, congestion, and headaches as well. No vomiting but she states she feels very dehydrated and is asking for water.  Past Medical History:  Diagnosis Date  . Anemia   . Anxiety   . Depression     Patient Active Problem List   Diagnosis Date Noted  . Pelvic abscess in female 01/13/2016  . PID (acute pelvic inflammatory disease) 01/13/2016    Past Surgical History:  Procedure Laterality Date  . LAPAROSCOPY     3 weeks ago    OB History    Gravida Para Term Preterm AB Living   0 0 0 0 0 0   SAB TAB Ectopic Multiple Live Births   0 0 0 0 0       Home Medications    Prior to Admission medications   Medication Sig Start Date End Date Taking? Authorizing Provider  doxycycline (VIBRAMYCIN) 100 MG capsule Take 1 capsule (100 mg total) by mouth 2 (two) times daily. 01/16/16   Anyanwu, Sallyanne Havers, MD  ibuprofen (ADVIL,MOTRIN) 800 MG tablet Take 1 tablet (800 mg total) by mouth 3 (three) times daily with meals as needed for headache or moderate pain. 01/16/16   Anyanwu, Sallyanne Havers, MD  metroNIDAZOLE (FLAGYL) 500 MG tablet Take 1 tablet (500 mg total) by mouth 2 (two) times daily. 01/16/16   Anyanwu, Sallyanne Havers, MD  promethazine (PHENERGAN) 25 MG tablet Take  1 tablet (25 mg total) by mouth every 6 (six) hours as needed for nausea or vomiting. 01/16/16   Anyanwu, Sallyanne Havers, MD    Family History No family history on file.  Social History Social History  Substance Use Topics  . Smoking status: Current Every Day Smoker    Types: Cigarettes  . Smokeless tobacco: Never Used  . Alcohol use Yes     Comment: 6 beers perday- 2 years ago was last time hx of alcoholism per pt.     Allergies   Patient has no known allergies.   Review of Systems Review of Systems  Constitutional: Positive for fever.  HENT: Positive for congestion and rhinorrhea.   Respiratory: Positive for cough and shortness of breath.   Cardiovascular: Positive for chest pain.  Gastrointestinal: Negative for vomiting.  Neurological: Positive for dizziness and headaches.  All other systems reviewed and are negative.    Physical Exam Updated Vital Signs BP 102/64   Pulse (!) 108   Temp 99.4 F (37.4 C) (Oral)   Resp (!) 26   SpO2 94%   Physical Exam  Constitutional: She is oriented to person, place, and time. She appears well-developed and well-nourished. She appears distressed (in pain).  Thin female  HENT:  Head:  Normocephalic and atraumatic.  Right Ear: External ear normal.  Left Ear: External ear normal.  Nose: Nose normal.  Eyes: Right eye exhibits no discharge. Left eye exhibits no discharge.  Cardiovascular: Regular rhythm and normal heart sounds.  Tachycardia present.   Pulmonary/Chest: Effort normal. No accessory muscle usage. She has decreased breath sounds in the right lower field and the left lower field.  Abdominal: Soft. There is tenderness (diffuse).  Neurological: She is alert and oriented to person, place, and time.  Skin: Skin is warm and dry. She is not diaphoretic.  Multiple track marks  Nursing note and vitals reviewed.    ED Treatments / Results  Labs (all labs ordered are listed, but only abnormal results are displayed) Labs Reviewed   COMPREHENSIVE METABOLIC PANEL - Abnormal; Notable for the following:       Result Value   Sodium 131 (*)    Potassium 2.9 (*)    Chloride 96 (*)    Glucose, Bld 125 (*)    Calcium 8.0 (*)    Albumin 2.8 (*)    All other components within normal limits  CBC WITH DIFFERENTIAL/PLATELET - Abnormal; Notable for the following:    WBC 15.8 (*)    Hemoglobin 10.8 (*)    HCT 31.6 (*)    Platelets 121 (*)    Neutro Abs 12.8 (*)    Monocytes Absolute 1.9 (*)    All other components within normal limits  CULTURE, BLOOD (ROUTINE X 2)  CULTURE, BLOOD (ROUTINE X 2)  MAGNESIUM  LIPASE, BLOOD  URINALYSIS, ROUTINE W REFLEX MICROSCOPIC  I-STAT CG4 LACTIC ACID, ED    EKG  EKG Interpretation  Date/Time:  Sunday January 24 2017 08:34:59 EDT Ventricular Rate:  103 PR Interval:    QRS Duration: 84 QT Interval:  334 QTC Calculation: 438 R Axis:   89 Text Interpretation:  Sinus tachycardia LVH by voltage No old tracing to compare Confirmed by Sherwood Gambler [phone]) on 01/24/2017 8:43:34 AM       Radiology Dg Chest 2 View  Result Date: 01/24/2017 CLINICAL DATA:  Fever EXAM: CHEST  2 VIEW COMPARISON:  None. FINDINGS: Patchy opacity at the left base and right perihilar/ basal lung. No edema, effusion, or pneumothorax. Normal heart size. No acute osseous finding. IMPRESSION: Bilateral pneumonia. Electronically Signed   By: Monte Fantasia M.D.   On: 01/24/2017 08:18    Procedures Procedures (including critical care time)  Medications Ordered in ED Medications  azithromycin (ZITHROMAX) 500 mg in dextrose 5 % 250 mL IVPB (not administered)  vancomycin (VANCOCIN) IVPB 1000 mg/200 mL premix (not administered)  sodium chloride 0.9 % bolus 1,000 mL (1,000 mLs Intravenous New Bag/Given 01/24/17 0836)  cefTRIAXone (ROCEPHIN) 1 g in dextrose 5 % 50 mL IVPB (0 g Intravenous Stopped 01/24/17 0923)  fentaNYL (SUBLIMAZE) injection 100 mcg (100 mcg Intravenous Given 01/24/17 0836)  potassium chloride SA  (K-DUR,KLOR-CON) CR tablet 40 mEq (40 mEq Oral Given 01/24/17 0936)  ibuprofen (ADVIL,MOTRIN) tablet 400 mg (400 mg Oral Given 01/24/17 0936)     Initial Impression / Assessment and Plan / ED Course  I have reviewed the triage vital signs and the nursing notes.  Pertinent labs & imaging results that were available during my care of the patient were reviewed by me and considered in my medical decision making (see chart for details).     Patient's chest x-ray confirms bilateral pneumonia. Given her history of IV drug abuse, tachycardia, and elevated WBC, I think she is  at a higher risk for decompensation. She will be given IV antibiotics, including vancomycin to cover for MRSA given her IV drug abuse, and she will be admitted to the hospitalist for further management and care. No hypotension or elevated lactic acid in ED. Meets SIRS. Airway/breathing stable at this time but is painful. Given fevers at home, cough, Xray findings, this is much more likely to be pneumonia rather than ACS, PE, dissection.  Final Clinical Impressions(s) / ED Diagnoses   Final diagnoses:  Pneumonia of both lower lobes due to infectious organism [facility])    New Prescriptions New Prescriptions   No medications on file     Sherwood Gambler, MD 01/24/17 [phone]

## 2017-01-25 ENCOUNTER — Telehealth (HOSPITAL_BASED_OUTPATIENT_CLINIC_OR_DEPARTMENT_OTHER): Payer: Self-pay | Admitting: *Deleted

## 2017-01-25 LAB — BLOOD CULTURE ID PANEL (REFLEXED)
ACINETOBACTER BAUMANNII: NOT DETECTED
CANDIDA GLABRATA: NOT DETECTED
CANDIDA TROPICALIS: NOT DETECTED
CARBAPENEM RESISTANCE: NOT DETECTED
Candida albicans: NOT DETECTED
Candida krusei: NOT DETECTED
Candida parapsilosis: NOT DETECTED
ESCHERICHIA COLI: NOT DETECTED
Enterobacter cloacae complex: NOT DETECTED
Enterobacteriaceae species: NOT DETECTED
Enterococcus species: NOT DETECTED
HAEMOPHILUS INFLUENZAE: NOT DETECTED
Klebsiella oxytoca: NOT DETECTED
Klebsiella pneumoniae: NOT DETECTED
LISTERIA MONOCYTOGENES: NOT DETECTED
METHICILLIN RESISTANCE: NOT DETECTED
NEISSERIA MENINGITIDIS: NOT DETECTED
PROTEUS SPECIES: NOT DETECTED
Pseudomonas aeruginosa: NOT DETECTED
SERRATIA MARCESCENS: NOT DETECTED
STAPHYLOCOCCUS SPECIES: DETECTED — AB
STREPTOCOCCUS PYOGENES: NOT DETECTED
Staphylococcus aureus (BCID): DETECTED — AB
Streptococcus agalactiae: NOT DETECTED
Streptococcus pneumoniae: NOT DETECTED
Streptococcus species: NOT DETECTED
VANCOMYCIN RESISTANCE: NOT DETECTED

## 2017-01-25 NOTE — Progress Notes (Signed)
Called with blood cultures positive x 2 for staph aureus.  Contact information in chart is nonfunctional. Phone rang 4 times and then cut off.  I called and spoke with patient's mother Sherri Francis and asked her to have her daughter come back to ER immediately.  She started crying and said "Is she septic?" I told her it was imperative that her daughter return.  She said she would try to contact her daughter and have her return to the ED.

## 2017-01-26 LAB — CULTURE, BLOOD (ROUTINE X 2): Special Requests: ADEQUATE

## 2017-01-27 ENCOUNTER — Inpatient Hospital Stay (HOSPITAL_COMMUNITY)
Admission: EM | Admit: 2017-01-27 | Discharge: 2017-02-13 | DRG: 871 | Discharge status: Against Medical Advice | Payer: Self-pay | Attending: Internal Medicine | Admitting: Internal Medicine

## 2017-01-27 ENCOUNTER — Inpatient Hospital Stay (HOSPITAL_COMMUNITY): Payer: Self-pay

## 2017-01-27 ENCOUNTER — Encounter (HOSPITAL_COMMUNITY): Payer: Self-pay | Admitting: Obstetrics and Gynecology

## 2017-01-27 DIAGNOSIS — B182 Chronic viral hepatitis C: Secondary | ICD-10-CM | POA: Diagnosis present

## 2017-01-27 DIAGNOSIS — F17210 Nicotine dependence, cigarettes, uncomplicated: Secondary | ICD-10-CM | POA: Diagnosis present

## 2017-01-27 DIAGNOSIS — R1011 Right upper quadrant pain: Secondary | ICD-10-CM

## 2017-01-27 DIAGNOSIS — J189 Pneumonia, unspecified organism: Secondary | ICD-10-CM | POA: Diagnosis present

## 2017-01-27 DIAGNOSIS — K5900 Constipation, unspecified: Secondary | ICD-10-CM

## 2017-01-27 DIAGNOSIS — I2690 Septic pulmonary embolism without acute cor pulmonale: Secondary | ICD-10-CM | POA: Diagnosis present

## 2017-01-27 DIAGNOSIS — N739 Female pelvic inflammatory disease, unspecified: Secondary | ICD-10-CM | POA: Diagnosis present

## 2017-01-27 DIAGNOSIS — Z811 Family history of alcohol abuse and dependence: Secondary | ICD-10-CM

## 2017-01-27 DIAGNOSIS — F1123 Opioid dependence with withdrawal: Secondary | ICD-10-CM

## 2017-01-27 DIAGNOSIS — R7881 Bacteremia: Secondary | ICD-10-CM | POA: Diagnosis present

## 2017-01-27 DIAGNOSIS — B9561 Methicillin susceptible Staphylococcus aureus infection as the cause of diseases classified elsewhere: Secondary | ICD-10-CM | POA: Diagnosis present

## 2017-01-27 DIAGNOSIS — F329 Major depressive disorder, single episode, unspecified: Secondary | ICD-10-CM | POA: Diagnosis present

## 2017-01-27 DIAGNOSIS — A419 Sepsis, unspecified organism: Principal | ICD-10-CM | POA: Diagnosis present

## 2017-01-27 DIAGNOSIS — R52 Pain, unspecified: Secondary | ICD-10-CM

## 2017-01-27 DIAGNOSIS — F1120 Opioid dependence, uncomplicated: Secondary | ICD-10-CM

## 2017-01-27 DIAGNOSIS — E876 Hypokalemia: Secondary | ICD-10-CM | POA: Diagnosis present

## 2017-01-27 DIAGNOSIS — F418 Other specified anxiety disorders: Secondary | ICD-10-CM

## 2017-01-27 DIAGNOSIS — I330 Acute and subacute infective endocarditis: Secondary | ICD-10-CM

## 2017-01-27 DIAGNOSIS — D649 Anemia, unspecified: Secondary | ICD-10-CM

## 2017-01-27 DIAGNOSIS — D638 Anemia in other chronic diseases classified elsewhere: Secondary | ICD-10-CM | POA: Diagnosis present

## 2017-01-27 DIAGNOSIS — J90 Pleural effusion, not elsewhere classified: Secondary | ICD-10-CM

## 2017-01-27 DIAGNOSIS — Z8249 Family history of ischemic heart disease and other diseases of the circulatory system: Secondary | ICD-10-CM

## 2017-01-27 DIAGNOSIS — D509 Iron deficiency anemia, unspecified: Secondary | ICD-10-CM | POA: Diagnosis present

## 2017-01-27 DIAGNOSIS — F11288 Opioid dependence with other opioid-induced disorder: Secondary | ICD-10-CM

## 2017-01-27 DIAGNOSIS — F1124 Opioid dependence with opioid-induced mood disorder: Secondary | ICD-10-CM | POA: Diagnosis present

## 2017-01-27 DIAGNOSIS — F419 Anxiety disorder, unspecified: Secondary | ICD-10-CM | POA: Diagnosis present

## 2017-01-27 DIAGNOSIS — F1994 Other psychoactive substance use, unspecified with psychoactive substance-induced mood disorder: Secondary | ICD-10-CM

## 2017-01-27 HISTORY — DX: Suicide attempt, initial encounter: T14.91XA

## 2017-01-27 HISTORY — DX: Other psychoactive substance use, unspecified, uncomplicated: F19.90

## 2017-01-27 HISTORY — DX: Urinary tract infection, site not specified: N39.0

## 2017-01-27 HISTORY — DX: Unspecified viral hepatitis C without hepatic coma: B19.20

## 2017-01-27 LAB — COMPREHENSIVE METABOLIC PANEL
ALBUMIN: 2.4 g/dL — AB (ref 3.5–5.0)
ALK PHOS: 63 U/L (ref 38–126)
ALT: 20 U/L (ref 14–54)
AST: 26 U/L (ref 15–41)
Anion gap: 8 (ref 5–15)
BILIRUBIN TOTAL: 0.1 mg/dL — AB (ref 0.3–1.2)
BUN: 10 mg/dL (ref 6–20)
CALCIUM: 7.4 mg/dL — AB (ref 8.9–10.3)
CO2: 27 mmol/L (ref 22–32)
Chloride: 103 mmol/L (ref 101–111)
Creatinine, Ser: 0.38 mg/dL — ABNORMAL LOW (ref 0.44–1.00)
GFR calc Af Amer: >60 mL/min (ref >60)
GFR calc non Af Amer: >60 mL/min (ref >60)
GLUCOSE: 137 mg/dL — AB (ref 65–99)
Potassium: 3 mmol/L — ABNORMAL LOW (ref 3.5–5.1)
Sodium: 138 mmol/L (ref 135–145)
TOTAL PROTEIN: 6.6 g/dL (ref 6.5–8.1)

## 2017-01-27 LAB — RAPID URINE DRUG SCREEN, HOSP PERFORMED
AMPHETAMINES: NOT DETECTED
BARBITURATES: NOT DETECTED
BENZODIAZEPINES: NOT DETECTED
Cocaine: NOT DETECTED
Opiates: POSITIVE — AB
TETRAHYDROCANNABINOL: NOT DETECTED

## 2017-01-27 LAB — URINALYSIS, ROUTINE W REFLEX MICROSCOPIC
Bilirubin Urine: NEGATIVE
GLUCOSE, UA: NEGATIVE mg/dL
Ketones, ur: NEGATIVE mg/dL
Nitrite: NEGATIVE
PROTEIN: NEGATIVE mg/dL
SPECIFIC GRAVITY, URINE: 1.004 — AB (ref 1.005–1.030)
pH: 7 (ref 5.0–8.0)

## 2017-01-27 LAB — I-STAT CG4 LACTIC ACID, ED: LACTIC ACID, VENOUS: 2.07 mmol/L — AB (ref 0.5–1.9)

## 2017-01-27 LAB — CBC WITH DIFFERENTIAL/PLATELET
BASOS PCT: 0 %
Basophils Absolute: 0 10*3/uL (ref 0.0–0.1)
EOS ABS: 0 10*3/uL (ref 0.0–0.7)
EOS PCT: 0 %
HCT: 28.4 % — ABNORMAL LOW (ref 36.0–46.0)
Hemoglobin: 9.4 g/dL — ABNORMAL LOW (ref 12.0–15.0)
LYMPHS ABS: 2.3 10*3/uL (ref 0.7–4.0)
Lymphocytes Relative: 16 %
MCH: 26.6 pg (ref 26.0–34.0)
MCHC: 33.1 g/dL (ref 30.0–36.0)
MCV: 80.2 fL (ref 78.0–100.0)
MONO ABS: 1.3 10*3/uL — AB (ref 0.1–1.0)
Monocytes Relative: 9 %
NEUTROS ABS: 10.8 10*3/uL — AB (ref 1.7–7.7)
Neutrophils Relative %: 75 %
PLATELETS: 380 10*3/uL (ref 150–400)
RBC: 3.54 MIL/uL — ABNORMAL LOW (ref 3.87–5.11)
RDW: 15 % (ref 11.5–15.5)
WBC: 14.4 10*3/uL — ABNORMAL HIGH (ref 4.0–10.5)

## 2017-01-27 LAB — LIPASE, BLOOD: Lipase: 24 U/L (ref 11–51)

## 2017-01-27 LAB — CULTURE, BLOOD (ROUTINE X 2): Special Requests: ADEQUATE

## 2017-01-27 LAB — ETHANOL: Alcohol, Ethyl (B): <5 mg/dL (ref <5)

## 2017-01-27 LAB — PREGNANCY, URINE: PREG TEST UR: NEGATIVE

## 2017-01-27 MED ORDER — FOLIC ACID 1 MG PO TABS
1.0000 mg | ORAL_TABLET | Freq: Every day | ORAL | Status: DC
  Administered 2017-01-27 – 2017-01-31 (×5): 1 mg via ORAL
  Filled 2017-01-27 (×5): qty 1

## 2017-01-27 MED ORDER — CEFAZOLIN SODIUM-DEXTROSE 2-4 GM/100ML-% IV SOLN
2.0000 g | Freq: Three times a day (TID) | INTRAVENOUS | Status: DC
  Administered 2017-01-27 – 2017-01-28 (×2): 2 g via INTRAVENOUS
  Filled 2017-01-27 (×6): qty 100

## 2017-01-27 MED ORDER — LOPERAMIDE HCL 2 MG PO CAPS
2.0000 mg | ORAL_CAPSULE | ORAL | Status: AC | PRN
  Administered 2017-01-28: 4 mg via ORAL
  Filled 2017-01-27: qty 2

## 2017-01-27 MED ORDER — SODIUM CHLORIDE 0.9 % IV BOLUS (SEPSIS)
1000.0000 mL | Freq: Once | INTRAVENOUS | Status: AC
  Administered 2017-01-27: 1000 mL via INTRAVENOUS

## 2017-01-27 MED ORDER — IOPAMIDOL (ISOVUE-300) INJECTION 61%
100.0000 mL | Freq: Once | INTRAVENOUS | Status: AC | PRN
  Administered 2017-01-27: 75 mL via INTRAVENOUS

## 2017-01-27 MED ORDER — LORAZEPAM 2 MG/ML IJ SOLN: 1.0000 mg | Freq: Four times a day (QID) | INTRAMUSCULAR | Status: AC | PRN

## 2017-01-27 MED ORDER — HYDROXYZINE HCL 25 MG PO TABS
25.0000 mg | ORAL_TABLET | Freq: Four times a day (QID) | ORAL | Status: AC | PRN
  Administered 2017-01-30 – 2017-01-31 (×2): 25 mg via ORAL
  Filled 2017-01-27 (×3): qty 1

## 2017-01-27 MED ORDER — VITAMIN B-1 100 MG PO TABS
100.0000 mg | ORAL_TABLET | Freq: Every day | ORAL | Status: DC
  Administered 2017-01-28 – 2017-01-31 (×4): 100 mg via ORAL
  Filled 2017-01-27 (×4): qty 1

## 2017-01-27 MED ORDER — LORAZEPAM 1 MG PO TABS
0.0000 mg | ORAL_TABLET | Freq: Four times a day (QID) | ORAL | Status: DC
  Administered 2017-01-27 (×2): 2 mg via ORAL
  Administered 2017-01-28: 1 mg via ORAL
  Administered 2017-01-29: 2 mg via ORAL
  Filled 2017-01-27 (×2): qty 2
  Filled 2017-01-27: qty 1
  Filled 2017-01-27: qty 2

## 2017-01-27 MED ORDER — NAPROXEN 500 MG PO TABS
500.0000 mg | ORAL_TABLET | Freq: Two times a day (BID) | ORAL | Status: AC | PRN
  Administered 2017-01-28 – 2017-01-30 (×4): 500 mg via ORAL
  Filled 2017-01-27 (×5): qty 1

## 2017-01-27 MED ORDER — CLONIDINE HCL 0.1 MG PO TABS
0.1000 mg | ORAL_TABLET | Freq: Four times a day (QID) | ORAL | Status: AC
  Administered 2017-01-27 – 2017-01-29 (×7): 0.1 mg via ORAL
  Filled 2017-01-27 (×10): qty 1

## 2017-01-27 MED ORDER — THIAMINE HCL 100 MG/ML IJ SOLN
100.0000 mg | Freq: Every day | INTRAMUSCULAR | Status: DC
  Administered 2017-01-27: 100 mg via INTRAVENOUS
  Filled 2017-01-27: qty 2

## 2017-01-27 MED ORDER — DICYCLOMINE HCL 20 MG PO TABS
20.0000 mg | ORAL_TABLET | Freq: Four times a day (QID) | ORAL | Status: AC | PRN
  Administered 2017-01-28: 20 mg via ORAL
  Filled 2017-01-27: qty 1

## 2017-01-27 MED ORDER — METHOCARBAMOL 500 MG PO TABS
500.0000 mg | ORAL_TABLET | Freq: Three times a day (TID) | ORAL | Status: AC | PRN
  Administered 2017-01-31: 500 mg via ORAL
  Filled 2017-01-27: qty 1

## 2017-01-27 MED ORDER — IOPAMIDOL (ISOVUE-300) INJECTION 61%
INTRAVENOUS | Status: AC
  Administered 2017-01-27: 23:00:00
  Filled 2017-01-27: qty 100

## 2017-01-27 MED ORDER — LORAZEPAM 1 MG PO TABS
0.0000 mg | ORAL_TABLET | Freq: Two times a day (BID) | ORAL | Status: DC
Start: 2017-01-29 — End: 2017-01-31
  Administered 2017-01-29: 1 mg via ORAL
  Administered 2017-01-30: 2 mg via ORAL
  Filled 2017-01-27: qty 1
  Filled 2017-01-27: qty 2

## 2017-01-27 MED ORDER — ACETAMINOPHEN 325 MG PO TABS: 650.0000 mg | ORAL_TABLET | Freq: Four times a day (QID) | ORAL | Status: DC | PRN

## 2017-01-27 MED ORDER — CLONIDINE HCL 0.1 MG PO TABS
0.1000 mg | ORAL_TABLET | ORAL | Status: AC
  Administered 2017-01-30 – 2017-01-31 (×4): 0.1 mg via ORAL
  Filled 2017-01-27 (×4): qty 1

## 2017-01-27 MED ORDER — ADULT MULTIVITAMIN W/MINERALS CH
1.0000 | ORAL_TABLET | Freq: Every day | ORAL | Status: DC
  Administered 2017-01-27 – 2017-01-31 (×5): 1 via ORAL
  Filled 2017-01-27 (×5): qty 1

## 2017-01-27 MED ORDER — LORAZEPAM 1 MG PO TABS
1.0000 mg | ORAL_TABLET | Freq: Four times a day (QID) | ORAL | Status: AC | PRN
  Administered 2017-01-28 – 2017-01-30 (×3): 1 mg via ORAL
  Filled 2017-01-27 (×3): qty 1

## 2017-01-27 MED ORDER — NICOTINE 14 MG/24HR TD PT24
14.0000 mg | MEDICATED_PATCH | Freq: Every day | TRANSDERMAL | Status: DC
  Administered 2017-01-29 – 2017-02-08 (×11): 14 mg via TRANSDERMAL
  Filled 2017-01-27 (×16): qty 1

## 2017-01-27 MED ORDER — POTASSIUM CHLORIDE CRYS ER 20 MEQ PO TBCR
40.0000 meq | EXTENDED_RELEASE_TABLET | Freq: Once | ORAL | Status: AC
  Administered 2017-01-27: 40 meq via ORAL
  Filled 2017-01-27: qty 2

## 2017-01-27 MED ORDER — ACETAMINOPHEN 650 MG RE SUPP: 650.0000 mg | Freq: Four times a day (QID) | RECTAL | Status: DC | PRN

## 2017-01-27 MED ORDER — CLONIDINE HCL 0.1 MG PO TABS
0.1000 mg | ORAL_TABLET | Freq: Every day | ORAL | Status: AC
  Administered 2017-02-01 – 2017-02-02 (×2): 0.1 mg via ORAL
  Filled 2017-01-27 (×2): qty 1

## 2017-01-27 MED ORDER — VANCOMYCIN HCL IN DEXTROSE 1-5 GM/200ML-% IV SOLN
1000.0000 mg | Freq: Once | INTRAVENOUS | Status: AC
  Administered 2017-01-27: 1000 mg via INTRAVENOUS
  Filled 2017-01-27: qty 200

## 2017-01-27 MED ORDER — ONDANSETRON 4 MG PO TBDP
4.0000 mg | ORAL_TABLET | Freq: Four times a day (QID) | ORAL | Status: AC | PRN
  Administered 2017-01-28 – 2017-01-31 (×3): 4 mg via ORAL
  Filled 2017-01-27 (×3): qty 1

## 2017-01-27 MED ORDER — ENOXAPARIN SODIUM 30 MG/0.3ML ~~LOC~~ SOLN
30.0000 mg | SUBCUTANEOUS | Status: DC
  Administered 2017-01-27 – 2017-01-30 (×4): 30 mg via SUBCUTANEOUS
  Filled 2017-01-27 (×4): qty 0.3

## 2017-01-27 NOTE — ED Notes (Signed)
Mother number: Tammy: [phone] Father: Ronalee Belts: [phone]

## 2017-01-27 NOTE — ED Triage Notes (Signed)
Per EMS:  Pt coming from home. Pt reports to EMS that she was seen here 3 days ago and checked herself out. Pt reports she was called and told she was septic and she needed to come back. Pt has a hx of IV drug abuse.  Pt states she has hepatitis B Vitals: 130/60 Pulse 108 96% RA 16 RR

## 2017-01-27 NOTE — ED Notes (Signed)
Bed: WA02 Expected date:  Expected time:  Means of arrival:  Comments: EMS pneumonia

## 2017-01-27 NOTE — ED Provider Notes (Signed)
[facility] DEPT Provider Note   CSN: [id number] Arrival date & time: 01/27/17  1232     History   Chief Complaint Chief Complaint  Patient presents with  . Fatigue    HPI Sherri Francis is a 31 y.o. female.  HPI Patient, With past medical history of IV drug use, presents to ED for evaluation of sepsis, bilateral pneumonia. She was seen here in the ED 3 days ago and left AMA, after she was diagnosed with bilateral pneumonia, hypokalemia, hyponatremia. Blood cultures x2 grew staph aureus and she was called back 3 days ago. Patient returns today because she continues to have cough, shortness of breath, abdominal pain. She states that she received her dose of antibiotics in the ED and inpatient for a few hours but was not sent home with any medication. Patient was given vancomycin, Rocephin and azithromycin in the ED.  Past Medical History:  Diagnosis Date  . Anemia   . Anxiety   . Depression     Patient Active Problem List   Diagnosis Date Noted  . Bacteremia due to Staphylococcus aureus 01/27/2017  . Pneumonia 01/24/2017  . Pelvic abscess in female 01/13/2016  . PID (acute pelvic inflammatory disease) 01/13/2016    Past Surgical History:  Procedure Laterality Date  . LAPAROSCOPY     3 weeks ago    OB History    Gravida Para Term Preterm AB Living   0 0 0 0 0 0   SAB TAB Ectopic Multiple Live Births   0 0 0 0 0       Home Medications    Prior to Admission medications   Medication Sig Start Date End Date Taking? Authorizing Provider  Phenyleph-Doxylamine-DM-APAP (NYQUIL SEVERE COLD/FLU) 5-6.25-10-325 MG/15ML LIQD Take 30 mLs by mouth daily as needed (for cough/congestion).    [provider]    Family History History reviewed. No pertinent family history.  Social History Social History  Substance Use Topics  . Smoking status: Current Every Day Smoker    Types: Cigarettes  . Smokeless tobacco: Never Used  . Alcohol use Yes     Comment: 6  beers perday- 2 years ago was last time hx of alcoholism per pt.     Allergies   Patient has no known allergies.   Review of Systems Review of Systems  Constitutional: Positive for appetite change and chills. Negative for fever.  HENT: Negative for ear pain, rhinorrhea, sneezing and sore throat.   Eyes: Negative for photophobia and visual disturbance.  Respiratory: Positive for cough and shortness of breath. Negative for chest tightness and wheezing.   Cardiovascular: Negative for chest pain and palpitations.  Gastrointestinal: Positive for abdominal pain and diarrhea. Negative for blood in stool, constipation, nausea and vomiting.  Genitourinary: Positive for dysuria. Negative for hematuria and urgency.  Musculoskeletal: Negative for myalgias.  Skin: Negative for rash.  Neurological: Negative for dizziness, weakness and light-headedness.     Physical Exam Updated Vital Signs BP 114/65 (BP Location: Left Arm)   Pulse 78   Temp 99.3 F (37.4 C) (Oral)   Resp 14   Ht 5\' 1"  (1.549 m)   Wt 44.9 kg (99 lb)   SpO2 100%   BMI 18.71 kg/m   Physical Exam  Constitutional: She appears well-developed and well-nourished. No distress.  Appears drowsy.  HENT:  Head: Normocephalic and atraumatic.  Nose: Nose normal.  Eyes: Conjunctivae and EOM are normal. Right eye exhibits no discharge. Left eye exhibits no discharge. No scleral icterus.  Neck: Normal range of motion. Neck supple.  Cardiovascular: Normal rate, regular rhythm, normal heart sounds and intact distal pulses.  Exam reveals no gallop and no friction rub.   No murmur heard. No murmur noted.  Pulmonary/Chest: Effort normal and breath sounds normal. No respiratory distress.  Abdominal: Soft. Bowel sounds are normal. She exhibits no distension. There is tenderness (Generalized). There is no guarding.  Musculoskeletal: Normal range of motion. She exhibits no edema.  Neurological: She is alert. She exhibits normal muscle tone.  Coordination normal.  Skin: Skin is warm and dry. No rash noted.  Track marks present.  Psychiatric: She has a normal mood and affect.  Nursing note and vitals reviewed.    ED Treatments / Results  Labs (all labs ordered are listed, but only abnormal results are displayed) Labs Reviewed  COMPREHENSIVE METABOLIC PANEL - Abnormal; Notable for the following:       Result Value   Potassium 3.0 (*)    Glucose, Bld 137 (*)    Creatinine, Ser 0.38 (*)    Calcium 7.4 (*)    Albumin 2.4 (*)    Total Bilirubin 0.1 (*)    All other components within normal limits  CBC WITH DIFFERENTIAL/PLATELET - Abnormal; Notable for the following:    WBC 14.4 (*)    RBC 3.54 (*)    Hemoglobin 9.4 (*)    HCT 28.4 (*)    Neutro Abs 10.8 (*)    Monocytes Absolute 1.3 (*)    All other components within normal limits  URINALYSIS, ROUTINE W REFLEX MICROSCOPIC - Abnormal; Notable for the following:    Color, Urine STRAW (*)    Specific Gravity, Urine 1.004 (*)    Hgb urine dipstick SMALL (*)    Leukocytes, UA SMALL (*)    Bacteria, UA RARE (*)    Squamous Epithelial / LPF 0-5 (*)    All other components within normal limits  I-STAT CG4 LACTIC ACID, ED - Abnormal; Notable for the following:    Lactic Acid, Venous 2.07 (*)    All other components within normal limits  CULTURE, BLOOD (ROUTINE X 2)  CULTURE, BLOOD (ROUTINE X 2)  RAPID URINE DRUG SCREEN, HOSP PERFORMED  PREGNANCY, URINE    EKG  EKG Interpretation None       Radiology No results found.  Procedures Procedures (including critical care time)  Medications Ordered in ED Medications  LORazepam (ATIVAN) tablet 1 mg (not administered)    Or  LORazepam (ATIVAN) injection 1 mg (not administered)  thiamine (VITAMIN B-1) tablet 100 mg (not administered)    Or  thiamine (B-1) injection 100 mg (not administered)  folic acid (FOLVITE) tablet 1 mg (not administered)  multivitamin with minerals tablet 1 tablet (not administered)    LORazepam (ATIVAN) tablet 0-4 mg (not administered)    Followed by  LORazepam (ATIVAN) tablet 0-4 mg (not administered)  sodium chloride 0.9 % bolus 1,000 mL (1,000 mLs Intravenous New Bag/Given 01/27/17 1502)  vancomycin (VANCOCIN) IVPB 1000 mg/200 mL premix (1,000 mg Intravenous New Bag/Given 01/27/17 1502)     Initial Impression / Assessment and Plan / ED Course  I have reviewed the triage vital signs and the nursing notes.  Pertinent labs & imaging results that were available during my care of the patient were reviewed by me and considered in my medical decision making (see chart for details).     Patient presents to ED for evaluation of sepsis, bilateral pneumonia. She left AMA 3 days ago from  inpatient treatment. She was called 3 days ago for positive blood cultures 2 that grew staph radius. She does have a history of IV drug use. Today she has temperature of 99.3. Normal heart rate noted at 70s to 90s. Lactate elevated 2.07. Leukocytosis improving but still elevated at 14.4.  Urinalysis shows improving UTI. Blood cultures obtained again 2. Patient began on vancomycin and fluids. Will admit for completion of workup from 3 days ago for sepsis.  Final Clinical Impressions(s) / ED Diagnoses   Final diagnoses:  None    New Prescriptions New Prescriptions   No medications on file     Delia Heady, PA-C 01/27/17 1727    Fredia Sorrow, MD 02/01/17 [phone]

## 2017-01-27 NOTE — Progress Notes (Signed)
A consult was received from an ED physician for vancomycin per pharmacy dosing.  The patient's profile has been reviewed for ht/wt/allergies/indication/available labs.   A one time order has been placed for vancomycin 1g IV.  Further antibiotics/pharmacy consults should be ordered by admitting physician if indicated.                       Thank you,  Adrian Saran, PharmD, BCPS Pager [phone] 01/27/2017 1:50 PM

## 2017-01-27 NOTE — Progress Notes (Signed)
Pharmacy Antibiotic Note  Sherri Francis is a 31 y.o. female admitted on 01/27/2017 with MSSA bacteremia.  Pharmacy has been consulted for cefazolin dosing.  Patient received vancomycin 1g at 15:02 in ED.  Plan: Cefazolin 2g IV q8h Follow up renal function & cultures, ID recommendations  Height: 5\' 1"  (154.9 cm) Weight: 99 lb (44.9 kg) IBW/kg (Calculated) : 47.8  Temp (24hrs), Avg:99.3 F (37.4 C), Min:99.3 F (37.4 C), Max:99.3 F (37.4 C)   Recent Labs Lab 01/24/17 0823 01/24/17 0833 01/27/17 1310 01/27/17 1336  WBC 15.8*  --  14.4*  --   CREATININE 0.53  --  0.38*  --   LATICACIDVEN  --  1.08  --  2.07*    Estimated Creatinine Clearance: 73.5 mL/min (A) (by C-G formula based on SCr of 0.38 mg/dL (L)).    No Known Allergies  Antimicrobials this admission:  8/22 Vanc x 1 8/22 Cefazolin >>  Dose adjustments this admission:    Microbiology results:  8/19 BCx: MSSA 8/19 MRSA PCR: neg 8/22 BCx: sent  Thank you for allowing pharmacy to be a part of this patient's care.  Peggyann Juba, PharmD, BCPS Pager: [phone] 01/27/2017 4:59 PM

## 2017-01-27 NOTE — H&P (Addendum)
History and Physical    Sherri Francis RDE:[id number] DOB: [birth year] DOA: 01/27/2017  Referring MD/NP/PA: Delia Heady PCP: Patient, No Pcp Per   Patient coming from: home  Chief Complaint: called to return due to positive blood cultures  HPI: Sherri Francis is a 31 y.o. female with history of IVDA, hepatitis C, alcohol use, tobacco abuse, depression/anxiety with suicide attempts who presented to the ER on 8/19 with about a week of fever and cough.  She was found to have bilateral pneumonia and was given vancomycin and ceftriaxone.  She was going to be admitted to the hospital, but she left against medical advice because her withdrawal symptoms were severe.  She was called back on the same date because 2/2 blood cultures were positive for MSSA and advised to come back to the hospital.  She returned to the ER on 8/22.  She lives with her boyfriend who is also an IVDA in a hotel and neither of them have jobs.  She has been using less heroin over the last week she thinks and she last used 2 days ago.  She has had symptoms of withdrawal including feeling fevering, having chills, abdominal pain, body aches 9/10 throughout, nausea, vomiting, and diarrhea.  She has right flank/rib pains that are pleuritic.  10/10, worse with deep breaths.  Massaging area did not help.    ED Course: Vital signs stable.  Labs:  WBC 14.4, lactic acid 2.07.  Hemoglobin 9.4 g/dl, potassium 3.    Review of Systems:  General:  Positive fevers, chills, weight change HEENT:  Denies changes to hearing and vision, sinus congestion, sore throat CV:  Denies chest pain, palpitations, lower extremity edema.  PULM:  positive SOB, cough.   GI:  Positive nausea, vomiting, denies constipation, diarrhea.   GU:  Denies dysuria, frequency, urgency ENDO:  Denies polyuria, polydipsia.   HEME:  Denies blood in stools, abnormal bruising or bleeding.  LYMPH:  Denies lymphadenopathy.   MSK:  Denies arthralgias, myalgias.   DERM:  Denies skin  rash or ulcer.  Injects into both arms.   NEURO:  Denies new focal numbness or weakness, slurred speech, confusion PSYCH:  Chronic anxiety and depression.    Past Medical History:  Diagnosis Date  . Anemia   . Anxiety   . Depression     Past Surgical History:  Procedure Laterality Date  . LAPAROSCOPY     3 weeks ago     reports that she has been smoking Cigarettes.  She has never used smokeless tobacco. She reports that she drinks alcohol. She reports that she uses drugs, including IV.  No Known Allergies  No family history on file.  Prior to Admission medications   Medication Sig Start Date End Date Taking? Authorizing Provider  Phenyleph-Doxylamine-DM-APAP (NYQUIL SEVERE COLD/FLU) 5-6.25-10-325 MG/15ML LIQD Take 30 mLs by mouth daily as needed (for cough/congestion).    [provider]    Physical Exam: Vitals:   01/27/17 1246 01/27/17 1252 01/27/17 1518  BP: (!) 108/54  114/65  Pulse: 92  78  Resp: 16  14  Temp: 99.3 F (37.4 C)    TempSrc: Oral    SpO2: 98%  100%  Weight:  44.9 kg (99 lb)   Height:  5\' 1"  (1.549 m)     Constitutional:  NAD, lids drooping, calm but flat affect  Eyes: PERRL, lids normal and conjunctivae injected.   ENMT:  Moist mucous membranes.  Oropharynx nonerythematous, no exudates.   Neck:  No nuchal rigidity,  no masses Respiratory:  No wheezes, rales, or rhonchi Cardiovascular: Regular rate and rhythm, no murmurs / rubs / gallops.  2+ radial pulses. Abdomen:  Normal active bowel sounds, soft, nondistended, nontender Musculoskeletal: Normal muscle tone and bulk.  No contractures.  Skin:  Track marks on both arms.   Neurologic:  CN 2-12 grossly intact. Sensation intact to light touch, strength 5/5 throughout Psychiatric:  Alert and oriented x 3. Blunted affect.   Labs on Admission: I have personally reviewed following labs and imaging studies  CBC:  Recent Labs Lab 01/24/17 0823 01/27/17 1310  WBC 15.8* 14.4*  NEUTROABS  12.8* 10.8*  HGB 10.8* 9.4*  HCT 31.6* 28.4*  MCV 79.4 80.2  PLT 121* 932   Basic Metabolic Panel:  Recent Labs Lab 01/24/17 0823 01/24/17 0917 01/27/17 1310  NA 131*  --  138  K 2.9*  --  3.0*  CL 96*  --  103  CO2 25  --  27  GLUCOSE 125*  --  137*  BUN 9  --  10  CREATININE 0.53  --  0.38*  CALCIUM 8.0*  --  7.4*  MG  --  2.1  --    GFR: Estimated Creatinine Clearance: 73.5 mL/min (A) (by C-G formula based on SCr of 0.38 mg/dL (L)). Liver Function Tests:  Recent Labs Lab 01/24/17 0823 01/27/17 1310  AST 37 26  ALT 29 20  ALKPHOS 89 63  BILITOT 0.7 0.1*  PROT 7.0 6.6  ALBUMIN 2.8* 2.4*    Recent Labs Lab 01/24/17 0917  LIPASE 24   No results for input(s): AMMONIA in the last 168 hours. Coagulation Profile: No results for input(s): INR, PROTIME in the last 168 hours. Cardiac Enzymes: No results for input(s): CKTOTAL, CKMB, CKMBINDEX, TROPONINI in the last 168 hours. BNP (last 3 results) No results for input(s): PROBNP in the last 8760 hours. HbA1C: No results for input(s): HGBA1C in the last 72 hours. CBG: No results for input(s): GLUCAP in the last 168 hours. Lipid Profile: No results for input(s): CHOL, HDL, LDLCALC, TRIG, CHOLHDL, LDLDIRECT in the last 72 hours. Thyroid Function Tests: No results for input(s): TSH, T4TOTAL, FREET4, T3FREE, THYROIDAB in the last 72 hours. Anemia Panel: No results for input(s): VITAMINB12, FOLATE, FERRITIN, TIBC, IRON, RETICCTPCT in the last 72 hours. Urine analysis:    Component Value Date/Time   COLORURINE STRAW (A) 01/27/2017 1516   APPEARANCEUR CLEAR 01/27/2017 1516   LABSPEC 1.004 (L) 01/27/2017 1516   PHURINE 7.0 01/27/2017 1516   GLUCOSEU NEGATIVE 01/27/2017 1516   HGBUR SMALL (A) 01/27/2017 1516   BILIRUBINUR NEGATIVE 01/27/2017 1516   KETONESUR NEGATIVE 01/27/2017 1516   PROTEINUR NEGATIVE 01/27/2017 1516   UROBILINOGEN 0.2 10/15/2012 1341   NITRITE NEGATIVE 01/27/2017 1516   LEUKOCYTESUR SMALL (A)  01/27/2017 1516   Sepsis Labs: @LABRCNTIP (procalcitonin:4,lacticidven:4) ) Recent Results (from the past 240 hour(s))  Culture, blood (routine x 2)     Status: Abnormal   Collection Time: 01/24/17  8:23 AM  Result Value Ref Range Status   Specimen Description BLOOD LEFT FOREARM  Final   Special Requests   Final    BOTTLES DRAWN AEROBIC AND ANAEROBIC Blood Culture adequate volume   Culture  Setup Time   Final    IN BOTH AEROBIC AND ANAEROBIC BOTTLES GRAM POSITIVE COCCI IN CLUSTERS CRITICAL RESULT CALLED TO, READ BACK BY AND VERIFIED WITH: TO SMAYNARD(RN) BY TCLEVELAND 01/24/2017 AT 11:52PM  CRITICAL RESULT CALLED TO, READ BACK BY AND VERIFIED WITH: D  Dione Plover AT [facility] 9233 01/25/17 BY L BENFIELD Performed at [facility], [place] [address] [place], [place] [address]    Culture STAPHYLOCOCCUS AUREUS (A)  Final   Report Status 01/26/2017 FINAL  Final   Organism ID, Bacteria STAPHYLOCOCCUS AUREUS  Final      Susceptibility   Staphylococcus aureus - MIC*    CIPROFLOXACIN <=0.5 SENSITIVE Sensitive     ERYTHROMYCIN <=0.25 SENSITIVE Sensitive     GENTAMICIN <=0.5 SENSITIVE Sensitive     OXACILLIN 0.5 SENSITIVE Sensitive     TETRACYCLINE <=1 SENSITIVE Sensitive     VANCOMYCIN <=0.5 SENSITIVE Sensitive     TRIMETH/SULFA <=10 SENSITIVE Sensitive     CLINDAMYCIN <=0.25 SENSITIVE Sensitive     RIFAMPIN <=0.5 SENSITIVE Sensitive     Inducible Clindamycin NEGATIVE Sensitive     * STAPHYLOCOCCUS AUREUS  Blood Culture ID Panel (Reflexed)     Status: Abnormal   Collection Time: 01/24/17  8:23 AM  Result Value Ref Range Status   Enterococcus species NOT DETECTED NOT DETECTED Final   Vancomycin resistance NOT DETECTED NOT DETECTED Final   Listeria monocytogenes NOT DETECTED NOT DETECTED Final   Staphylococcus species DETECTED (A) NOT DETECTED Final    Comment: CRITICAL RESULT CALLED TO, READ BACK BY AND VERIFIED WITH: TO  SMAYNARD(RN) BY TCLEVELAND 01/24/2017 AT 11:52PM     Staphylococcus aureus DETECTED (A) NOT DETECTED Final    Comment: CRITICAL RESULT CALLED TO, READ BACK BY AND VERIFIED WITH: TO  SMAYNARD(RN) BY TCLEVELAND 01/24/2017 AT 11:52PM    Methicillin resistance NOT DETECTED NOT DETECTED Final   Streptococcus species NOT DETECTED NOT DETECTED Final   Streptococcus agalactiae NOT DETECTED NOT DETECTED Final   Streptococcus pneumoniae NOT DETECTED NOT DETECTED Final   Streptococcus pyogenes NOT DETECTED NOT DETECTED Final   Acinetobacter baumannii NOT DETECTED NOT DETECTED Final   Enterobacteriaceae species NOT DETECTED NOT DETECTED Final   Enterobacter cloacae complex NOT DETECTED NOT DETECTED Final   Escherichia coli NOT DETECTED NOT DETECTED Final   Klebsiella oxytoca NOT DETECTED NOT DETECTED Final   Klebsiella pneumoniae NOT DETECTED NOT DETECTED Final   Proteus species NOT DETECTED NOT DETECTED Final   Serratia marcescens NOT DETECTED NOT DETECTED Final   Carbapenem resistance NOT DETECTED NOT DETECTED Final   Haemophilus influenzae NOT DETECTED NOT DETECTED Final   Neisseria meningitidis NOT DETECTED NOT DETECTED Final   Pseudomonas aeruginosa NOT DETECTED NOT DETECTED Final   Candida albicans NOT DETECTED NOT DETECTED Final   Candida glabrata NOT DETECTED NOT DETECTED Final   Candida krusei NOT DETECTED NOT DETECTED Final   Candida parapsilosis NOT DETECTED NOT DETECTED Final   Candida tropicalis NOT DETECTED NOT DETECTED Final    Comment: Performed at [facility], [address] [place], [place] [address]  Culture, blood (routine x 2)     Status: Abnormal   Collection Time: 01/24/17  8:41 AM  Result Value Ref Range Status   Specimen Description BLOOD RIGHT HAND  Final   Special Requests IN PEDIATRIC BOTTLE Blood Culture adequate volume  Final   Culture  Setup Time   Final    IN PEDIATRIC BOTTLE GRAM POSITIVE COCCI IN CLUSTERS CRITICAL RESULT CALLED TO, READ BACK BY AND VERIFIED WITH: D FRANCIS,RN AT [place] 5456 01/25/17  BY L BENFIELD    Culture (A)  Final    STAPHYLOCOCCUS AUREUS SUSCEPTIBILITIES PERFORMED ON PREVIOUS CULTURE WITHIN THE LAST 5 DAYS.    Report Status 01/27/2017 FINAL  Final  MRSA PCR Screening     Status: None   Collection Time: 01/24/17 10:34 AM  Result Value Ref Range Status   MRSA by PCR NEGATIVE NEGATIVE Final    Comment:        The GeneXpert MRSA Assay (FDA approved for NASAL specimens only), is one component of a comprehensive MRSA colonization surveillance program. It is not intended to diagnose MRSA infection nor to guide or monitor treatment for MRSA infections.      Radiological Exams on Admission: No results found.  EKG: Independently reviewed. NSR on ECG from 8/19  Assessment/Plan Active Problems:   Bacteremia due to Staphylococcus aureus    Bacteremia due to MSSA in setting of IVDA.  Patient also had a possible pneumonia vs. Possible septic embolic.   -  Blood cultures daily  -  ECHO -  CT chest -  ID consultation -  Ancef per pharmacy -  Will eventually need PICC line and placement -  F/u pregnancy test (negative on 8/19)  RUQ pain -  LFTs wnl -  Check lipase -  RUQ [place] -  CT chest (for RLL abscess/infarct)  IVDA -  Recommend methadone or suboxone therapy long term -  Clonidine detox -  SW consult -  UDS  Reported distant ETOH use -  ETOH level -  CIWA protocol -  Thiamine, folate, MVI  Hypokalemia -  Oral potassium repletion  Normocytic anemia -  Iron studies, B12, folate -  TSH -  Occult stool -  Repeat hgb in AM  Hepatitis C  -  LFTs wnl  DVT prophylaxis: lovenox  Code Status: full code Family Communication:  Patient and mother Disposition Plan: will likely need SNF at discharge for long course of IV antibiotics  Consults called: ID  Admission status: telemetry, inpatient due to risk of decompensation if she has endocarditis or pulmonary emboli.  Work up pending.     Janece Canterbury MD [facility] Pager  [phone]  If 7PM-7AM, please contact night-coverage [email or web address] Password [facility]  01/27/2017, 3:55 PM

## 2017-01-28 ENCOUNTER — Inpatient Hospital Stay (HOSPITAL_COMMUNITY): Payer: Self-pay

## 2017-01-28 DIAGNOSIS — R7881 Bacteremia: Secondary | ICD-10-CM

## 2017-01-28 DIAGNOSIS — F418 Other specified anxiety disorders: Secondary | ICD-10-CM

## 2017-01-28 DIAGNOSIS — F1123 Opioid dependence with withdrawal: Secondary | ICD-10-CM

## 2017-01-28 DIAGNOSIS — F11288 Opioid dependence with other opioid-induced disorder: Secondary | ICD-10-CM

## 2017-01-28 LAB — ECHOCARDIOGRAM COMPLETE
AVAREAVTI: 2.28 cm2
AVPG: 6 mmHg
AVPKVEL: 126 cm/s
Ao pk vel: 0.8 m/s
CHL CUP AV PEAK INDEX: 1.65
CHL CUP DOP CALC LVOT VTI: 20.1 cm
EERAT: 5.61
EWDT: 194 ms
FS: 36 % (ref 28–44)
HEIGHTINCHES: 61 in
IV/PV OW: 1.01
LA ID, A-P, ES: 34 mm
LA diam end sys: 34 mm
LA vol index: 26.3 mL/m2
LA vol: 36.4 mL
LADIAMINDEX: 2.45 cm/m2
LAVOLA4C: 35.1 mL
LV E/e' medial: 5.61
LV E/e'average: 5.61
LV TDI E'LATERAL: 17.2
LV TDI E'MEDIAL: 11.3
LVELAT: 17.2 cm/s
LVOT SV: 57 mL
LVOT area: 2.84 cm2
LVOT diameter: 19 mm
LVOTPV: 101 cm/s
MV Dec: 194
MV pk A vel: 40.3 m/s
MV pk E vel: 96.5 m/s
MVPG: 4 mmHg
PW: 8.17 mm — AB (ref 0.6–1.1)
RV LATERAL S' VELOCITY: 15.8 cm/s
RV sys press: 20 mmHg
Reg peak vel: 209 cm/s
TAPSE: 23.3 mm
TRMAXVEL: 209 cm/s
WEIGHTICAEL: 1583.78 [oz_av]

## 2017-01-28 LAB — IRON AND TIBC
IRON: 24 ug/dL — AB (ref 28–170)
Saturation Ratios: 13 % (ref 10.4–31.8)
TIBC: 183 ug/dL — AB (ref 250–450)
UIBC: 159 ug/dL

## 2017-01-28 LAB — BASIC METABOLIC PANEL
ANION GAP: 6 (ref 5–15)
BUN: 7 mg/dL (ref 6–20)
CO2: 24 mmol/L (ref 22–32)
Calcium: 7.7 mg/dL — ABNORMAL LOW (ref 8.9–10.3)
Chloride: 106 mmol/L (ref 101–111)
Creatinine, Ser: 0.45 mg/dL (ref 0.44–1.00)
GFR calc Af Amer: >60 mL/min (ref >60)
Glucose, Bld: 114 mg/dL — ABNORMAL HIGH (ref 65–99)
POTASSIUM: 3.4 mmol/L — AB (ref 3.5–5.1)
Sodium: 136 mmol/L (ref 135–145)

## 2017-01-28 LAB — CBC
HEMATOCRIT: 28.7 % — AB (ref 36.0–46.0)
Hemoglobin: 9.5 g/dL — ABNORMAL LOW (ref 12.0–15.0)
MCH: 27 pg (ref 26.0–34.0)
MCHC: 33.1 g/dL (ref 30.0–36.0)
MCV: 81.5 fL (ref 78.0–100.0)
Platelets: 500 10*3/uL — ABNORMAL HIGH (ref 150–400)
RBC: 3.52 MIL/uL — ABNORMAL LOW (ref 3.87–5.11)
RDW: 15.1 % (ref 11.5–15.5)
WBC: 15.6 10*3/uL — AB (ref 4.0–10.5)

## 2017-01-28 LAB — FERRITIN: FERRITIN: 129 ng/mL (ref 11–307)

## 2017-01-28 LAB — MAGNESIUM: MAGNESIUM: 2 mg/dL (ref 1.7–2.4)

## 2017-01-28 LAB — TSH: TSH: 2.788 u[IU]/mL (ref 0.350–4.500)

## 2017-01-28 LAB — FOLATE: Folate: 15.2 ng/mL (ref >5.9)

## 2017-01-28 LAB — VITAMIN B12: Vitamin B-12: 744 pg/mL (ref 180–914)

## 2017-01-28 MED ORDER — NAFCILLIN SODIUM 2 G IJ SOLR: 2.0000 g | INTRAMUSCULAR | Status: DC

## 2017-01-28 MED ORDER — BUPRENORPHINE HCL-NALOXONE HCL 2-0.5 MG SL SUBL
2.0000 | SUBLINGUAL_TABLET | Freq: Every day | SUBLINGUAL | Status: DC
  Administered 2017-01-28: 2 via SUBLINGUAL
  Filled 2017-01-28: qty 2

## 2017-01-28 MED ORDER — BUPRENORPHINE HCL-NALOXONE HCL 8-2 MG SL SUBL
1.0000 | SUBLINGUAL_TABLET | Freq: Two times a day (BID) | SUBLINGUAL | Status: DC
  Administered 2017-01-28 – 2017-01-30 (×4): 1 via SUBLINGUAL
  Filled 2017-01-28 (×4): qty 1

## 2017-01-28 MED ORDER — NAFCILLIN SODIUM 2 G IJ SOLR
2.0000 g | INTRAMUSCULAR | Status: DC
  Administered 2017-01-28 – 2017-02-13 (×90): 2 g via INTRAVENOUS
  Filled 2017-01-28 (×100): qty 2000

## 2017-01-28 NOTE — Progress Notes (Signed)
Upon entering the room, urine and stool found in the middle of the floor. Pt states she was not able to make it to the bathroom. Pt reoriented to to the room and bathroom location, no evidence of understanding noted, pt places blanket over head and states she is cold. Pt assisted to the bathroom to be cleaned and assisted back to the bed. BSC brought to bedside.

## 2017-01-28 NOTE — Progress Notes (Signed)
  Echocardiogram 2D Echocardiogram has been performed.  Tobin Witucki T Itzayanna Kaster 01/28/2017, 2:05 PM

## 2017-01-28 NOTE — Progress Notes (Signed)
PROGRESS NOTE  Sherri Francis  GYI:[id number] DOB: Apr 13, 1986 DOA: 01/27/2017 PCP: Patient, No Pcp Per  Brief Narrative:   Sherri Francis is a 31 y.o. female with history of IVDA, hepatitis C, alcohol use, tobacco abuse, depression/anxiety with suicide attempts who presented to the ER on 8/19 with about a week of fever and cough.  She was found to have bilateral pneumonia and was given vancomycin and ceftriaxone.  She was going to be admitted to the hospital, but she left against medical advice because her withdrawal symptoms were severe.  She was called back on the same date because 2/2 blood cultures were positive for MSSA and advised to come back to the hospital.  She returned to the ER on 8/22.  She has septic pulmonary embolic with a 1.5 x 3.5KK tricuspid valve vegetation with mild regurgitation.    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 Evette Doffing:  Start suboxone 4-8mg  today and increase to 8mg  BID -  Clonidine detox -  SW consult -  UDS positive for opiates -  HIV and hep C pending  Reported distant ETOH use -  ETOH level negative -  CIWA protocol -  Thiamine, folate, MVI  Hypokalemia -  Oral potassium repletion  Normocytic anemia -  Iron studies, B12, folate -  TSH -  Occult stool -  Repeat hgb in AM  Hepatitis C  -  LFTs wnl  Hypokalemia, mild -  Oral potassium repletion  DVT prophylaxis: lovenox  Code Status: full code Family Communication:  Patient alone.   Disposition Plan: will likely need SNF at discharge for long course of IV antibiotics   Consultants:   ID  Procedures:  CT  chest ECHO  Antimicrobials:  Anti-infectives    Start     Dose/Rate Route Frequency Ordered Stop   01/28/17 1600  nafcillin 2 g in dextrose 5 % 100 mL IVPB     2 g 200 mL/hr over 30 Minutes Intravenous Every 4 hours 01/28/17 1445     01/28/17 1445  nafcillin injection 2 g  Status:  Discontinued     2 g Intravenous Every 4 hours 01/28/17 1435 01/28/17 1444   01/27/17 2200  ceFAZolin (ANCEF) IVPB 2g/100 mL premix  Status:  Discontinued     2 g 200 mL/hr over 30 Minutes Intravenous Every 8 hours 01/27/17 1702 01/28/17 1435   01/27/17 1400  vancomycin (VANCOCIN) IVPB 1000 mg/200 mL premix     1,000 mg 200 mL/hr over 60 Minutes Intravenous  Once 01/27/17 1348 01/27/17 1606       Subjective:  Severe all over pains.  Threatened to leave AMA unless I gave her IV dilaudid.  Pain with deep breaths, muscle aches.  Refused to talk after initial history.    Objective: Vitals:   01/27/17 2025 01/28/17 0600 01/28/17 0933 01/28/17 1415  BP: 118/71 122/80  122/68  Pulse: 92 79 88 89  Resp: 16 (!) 28  16  Temp: 99.9 F (37.7 C) 98.9 F (37.2 C)  99.3 F (37.4 C)  TempSrc: Oral Axillary  Oral  SpO2: 98% 97%  98%  Weight: 44.9 kg (98 lb 15.8 oz)     Height: 5\' 1"  (1.549 m)       Intake/Output Summary (Last 24 hours) at 01/28/17 1820 Last data filed at 01/28/17 1514  Gross per 24 hour  Intake              460 ml  Output                0 ml  Net              460 ml   Filed Weights   01/27/17 1252 01/27/17 2025  Weight: 44.9 kg (99 lb) 44.9 kg (98 lb 15.8 oz)    Examination:  General exam:  Thin female.  No acute distress.  HEENT:  NCAT, MMM Respiratory system:  CTAB  Cardiovascular system: Regular rate and rhythm, normal S1/S2. No murmurs, rubs, gallops or clicks.  Warm extremities Gastrointestinal system: Normal active bowel sounds, soft, nondistended, nontender. MSK:  Normal tone and bulk, no lower extremity edema.  Track marks on bilateral arms with bruising Neuro:  Grossly  intact    Data Reviewed: I have personally reviewed following labs and imaging studies  CBC:  Recent Labs Lab 01/24/17 0823 01/27/17 1310 01/28/17 0856  WBC 15.8* 14.4* 15.6*  NEUTROABS 12.8* 10.8*  --   HGB 10.8* 9.4* 9.5*  HCT 31.6* 28.4* 28.7*  MCV 79.4 80.2 81.5  PLT 121* 380 166*   Basic Metabolic Panel:  Recent Labs Lab 01/24/17 0823 01/24/17 0917 01/27/17 1310 01/28/17 0856  NA 131*  --  138 136  K 2.9*  --  3.0* 3.4*  CL 96*  --  103 106  CO2 25  --  27 24  GLUCOSE 125*  --  137* 114*  BUN 9  --  10 7  CREATININE 0.53  --  0.38* 0.45  CALCIUM 8.0*  --  7.4* 7.7*  MG  --  2.1  --  2.0   GFR: Estimated Creatinine Clearance: 73.5 mL/min (by C-G formula based on SCr of 0.45 mg/dL). Liver Function Tests:  Recent Labs Lab 01/24/17 0823 01/27/17 1310  AST 37 26  ALT 29 20  ALKPHOS 89 63  BILITOT 0.7 0.1*  PROT 7.0 6.6  ALBUMIN 2.8* 2.4*    Recent Labs Lab 01/24/17 0917 01/27/17 1310  LIPASE 24 24   No results for input(s): AMMONIA in the last 168 hours. Coagulation Profile: No results for input(s): INR, PROTIME in the last 168 hours. Cardiac Enzymes: No results for input(s): CKTOTAL, CKMB, CKMBINDEX, TROPONINI in the last 168 hours. BNP (last 3 results) No results for input(s): PROBNP in the last 8760 hours. HbA1C: No results for input(s): HGBA1C in the last 72 hours. CBG: No results for input(s): GLUCAP in the last 168 hours. Lipid Profile: No results for input(s): CHOL, HDL, LDLCALC, TRIG, CHOLHDL, LDLDIRECT in the last 72 hours. Thyroid Function Tests:  Recent Labs  01/28/17 0856  TSH 2.788   Anemia Panel:  Recent Labs  01/28/17 0856  VITAMINB12 744  FOLATE 15.2  FERRITIN 129  TIBC 183*  IRON 24*   Urine analysis:    Component Value Date/Time   COLORURINE STRAW (A) 01/27/2017 1516   APPEARANCEUR CLEAR 01/27/2017 1516   LABSPEC 1.004 (L) 01/27/2017 1516   PHURINE 7.0 01/27/2017 1516   GLUCOSEU NEGATIVE 01/27/2017  1516   HGBUR SMALL (A) 01/27/2017 [place] 01/27/2017 1516  KETONESUR NEGATIVE 01/27/2017 1516   PROTEINUR NEGATIVE 01/27/2017 1516   UROBILINOGEN 0.2 10/15/2012 1341   NITRITE NEGATIVE 01/27/2017 1516   LEUKOCYTESUR SMALL (A) 01/27/2017 1516   Sepsis Labs: @LABRCNTIP (procalcitonin:4,lacticidven:4)  ) Recent Results (from the past 240 hour(s))  Culture, blood (routine x 2)     Status: Abnormal   Collection Time: 01/24/17  8:23 AM  Result Value Ref Range Status   Specimen Description BLOOD LEFT FOREARM  Final   Special Requests   Final    BOTTLES DRAWN AEROBIC AND ANAEROBIC Blood Culture adequate volume   Culture  Setup Time   Final    IN BOTH AEROBIC AND ANAEROBIC BOTTLES GRAM POSITIVE COCCI IN CLUSTERS CRITICAL RESULT CALLED TO, READ BACK BY AND VERIFIED WITH: TO SMAYNARD(RN) BY TCLEVELAND 01/24/2017 AT 11:52PM  CRITICAL RESULT CALLED TO, READ BACK BY AND VERIFIED WITH: Hillary Bow AT [place] [id number] 01/25/17 BY L BENFIELD Performed at [facility], [place] [address], [place], [place] [address]    Culture STAPHYLOCOCCUS AUREUS (A)  Final   Report Status 01/26/2017 FINAL  Final   Organism ID, Bacteria STAPHYLOCOCCUS AUREUS  Final      Susceptibility   Staphylococcus aureus - MIC*    CIPROFLOXACIN <=0.5 SENSITIVE Sensitive     ERYTHROMYCIN <=0.25 SENSITIVE Sensitive     GENTAMICIN <=0.5 SENSITIVE Sensitive     OXACILLIN 0.5 SENSITIVE Sensitive     TETRACYCLINE <=1 SENSITIVE Sensitive     VANCOMYCIN <=0.5 SENSITIVE Sensitive     TRIMETH/SULFA <=10 SENSITIVE Sensitive     CLINDAMYCIN <=0.25 SENSITIVE Sensitive     RIFAMPIN <=0.5 SENSITIVE Sensitive     Inducible Clindamycin NEGATIVE Sensitive     * STAPHYLOCOCCUS AUREUS  Blood Culture ID Panel (Reflexed)     Status: Abnormal   Collection Time: 01/24/17  8:23 AM  Result Value Ref Range Status   Enterococcus species NOT DETECTED NOT DETECTED Final   Vancomycin resistance NOT DETECTED NOT DETECTED  Final   Listeria monocytogenes NOT DETECTED NOT DETECTED Final   Staphylococcus species DETECTED (A) NOT DETECTED Final    Comment: CRITICAL RESULT CALLED TO, READ BACK BY AND VERIFIED WITH: TO  SMAYNARD(RN) BY TCLEVELAND 01/24/2017 AT 11:52PM    Staphylococcus aureus DETECTED (A) NOT DETECTED Final    Comment: CRITICAL RESULT CALLED TO, READ BACK BY AND VERIFIED WITH: TO  SMAYNARD(RN) BY TCLEVELAND 01/24/2017 AT 11:52PM    Methicillin resistance NOT DETECTED NOT DETECTED Final   Streptococcus species NOT DETECTED NOT DETECTED Final   Streptococcus agalactiae NOT DETECTED NOT DETECTED Final   Streptococcus pneumoniae NOT DETECTED NOT DETECTED Final   Streptococcus pyogenes NOT DETECTED NOT DETECTED Final   Acinetobacter baumannii NOT DETECTED NOT DETECTED Final   Enterobacteriaceae species NOT DETECTED NOT DETECTED Final   Enterobacter cloacae complex NOT DETECTED NOT DETECTED Final   Escherichia coli NOT DETECTED NOT DETECTED Final   Klebsiella oxytoca NOT DETECTED NOT DETECTED Final   Klebsiella pneumoniae NOT DETECTED NOT DETECTED Final   Proteus species NOT DETECTED NOT DETECTED Final   Serratia marcescens NOT DETECTED NOT DETECTED Final   Carbapenem resistance NOT DETECTED NOT DETECTED Final   Haemophilus influenzae NOT DETECTED NOT DETECTED Final   Neisseria meningitidis NOT DETECTED NOT DETECTED Final   Pseudomonas aeruginosa NOT DETECTED NOT DETECTED Final   Candida albicans NOT DETECTED NOT DETECTED Final   Candida glabrata NOT DETECTED NOT DETECTED Final   Candida krusei NOT DETECTED NOT DETECTED Final   Candida parapsilosis NOT DETECTED  NOT DETECTED Final   Candida tropicalis NOT DETECTED NOT DETECTED Final    Comment: Performed at [facility], [place] [address] [place], [place] [address]  Culture, blood (routine x 2)     Status: Abnormal   Collection Time: 01/24/17  8:41 AM  Result Value Ref Range Status   Specimen Description BLOOD RIGHT HAND  Final   Special  Requests IN PEDIATRIC BOTTLE Blood Culture adequate volume  Final   Culture  Setup Time   Final    IN PEDIATRIC BOTTLE GRAM POSITIVE COCCI IN CLUSTERS CRITICAL RESULT CALLED TO, READ BACK BY AND VERIFIED WITH: D FRANCIS,RN AT [place] 3536 01/25/17 BY L BENFIELD    Culture (A)  Final    STAPHYLOCOCCUS AUREUS SUSCEPTIBILITIES PERFORMED ON PREVIOUS CULTURE WITHIN THE LAST 5 DAYS.    Report Status 01/27/2017 FINAL  Final  MRSA PCR Screening     Status: None   Collection Time: 01/24/17 10:34 AM  Result Value Ref Range Status   MRSA by PCR NEGATIVE NEGATIVE Final    Comment:        The GeneXpert MRSA Assay (FDA approved for NASAL specimens only), is one component of a comprehensive MRSA colonization surveillance program. It is not intended to diagnose MRSA infection nor to guide or monitor treatment for MRSA infections.   Blood culture (routine x 2)     Status: None (Preliminary result)   Collection Time: 01/27/17  2:10 PM  Result Value Ref Range Status   Specimen Description BLOOD RIGHT ANTECUBITAL  Final   Special Requests   Final    BOTTLES DRAWN AEROBIC AND ANAEROBIC Blood Culture adequate volume   Culture  Setup Time   Final    GRAM POSITIVE COCCI IN BOTH AEROBIC AND ANAEROBIC BOTTLES CRITICAL RESULT CALLED TO, READ BACK BY AND VERIFIED WITH: Gustavo Lah [id number] Performed at [facility], [place] [address] [place], [place] [address]    Culture GRAM POSITIVE COCCI  Final   Report Status PENDING  Incomplete  Blood culture (routine x 2)     Status: None (Preliminary result)   Collection Time: 01/27/17  2:32 PM  Result Value Ref Range Status   Specimen Description BLOOD LEFT HAND  Final   Special Requests   Final    BOTTLES DRAWN AEROBIC AND ANAEROBIC Blood Culture adequate volume   Culture  Setup Time   Final    GRAM POSITIVE COCCI AEROBIC BOTTLE ONLY CRITICAL VALUE NOTED.  VALUE IS CONSISTENT WITH PREVIOUSLY REPORTED AND CALLED VALUE. Performed at  [facility], [place] [address], [place], [place] [address]    Culture GRAM POSITIVE COCCI  Final   Report Status PENDING  Incomplete      Radiology Studies: Ct Chest W Contrast  Result Date: 01/27/2017 CLINICAL DATA:  Cough and fever. Bacteremia. IV drug abuse. Abnormal chest x-ray. EXAM: CT CHEST WITH CONTRAST TECHNIQUE: Multidetector CT imaging of the chest was performed during intravenous contrast administration. CONTRAST:  78mL ISOVUE-300 IOPAMIDOL (ISOVUE-300) INJECTION 61% COMPARISON:  Chest radiograph 01/24/2017 FINDINGS: Cardiovascular: The heart is normal in size. No pericardial effusion. Thoracic aorta is normal in caliber. Mediastinum/Nodes: Patient motion artifact limits assessment, patient had difficulty tolerating the exam. Enlarged right hilar node measures 10 mm. Prominent right infrahilar node measures 8 mm. There is distal esophageal wall thickening. Visualized thyroid gland is normal. Lungs/Pleura: Multiple bilateral pulmonary nodules, many of which are cavitary consistent with septic emboli. Adjacent lesions in the right  upper lobe measure 1.7 and 2.3 cm respectively. Right lower lobe cavitary lesion measures 3.1 cm. Confluent and ground-glass opacity with early cavitation in the lateral segment of the right lower lobe. Cavitary left lower lobe nodule measuring 2.2 x 1.9 cm abuts the pleura. Additional cavitary and non cavitary nodules in both lungs. Minimal right pleural thickening/ fluid. Upper Abdomen: Cyst in the right kidney. No evidence of acute abnormality. Musculoskeletal: Motion artifact limits detailed assessment. There are no acute or suspicious osseous abnormalities. IMPRESSION: 1. Innumerable bilateral cavitary nodules consistent with septic emboli, right greater than left. Minimal right pleural thickening/fluid. 2. Mild right hilar adenopathy is likely reactive. Electronically Signed   By: Jeb Levering M.D.   On: 01/27/2017 23:11   US Abdomen Limited  Ruq  Result Date: 01/27/2017 CLINICAL DATA:  Acute right upper quadrant abdominal pain. EXAM: ULTRASOUND ABDOMEN LIMITED RIGHT UPPER QUADRANT COMPARISON:  CT scan of January 13, 2016. FINDINGS: Gallbladder: No gallstones or wall thickening visualized. No sonographic Murphy sign noted by sonographer. Gallbladder does appear to be contracted. Common bile duct: Diameter: 2 mm which is within normal limits. Liver: No focal lesion identified. Within normal limits in parenchymal echogenicity. Portal vein is patent on color Doppler imaging with normal direction of blood flow towards the liver. IMPRESSION: No abnormality seen in the right upper quadrant of the abdomen. Electronically Signed   By: Marijo Conception, M.D.   On: 01/27/2017 17:39     Scheduled Meds: . buprenorphine-naloxone  1 tablet Sublingual BID  . cloNIDine  0.1 mg Oral QID   Followed by  . [START ON 01/30/2017] cloNIDine  0.1 mg Oral BH-qamhs   Followed by  . [START ON 02/01/2017] cloNIDine  0.1 mg Oral QAC breakfast  . enoxaparin (LOVENOX) injection  30 mg Subcutaneous Q24H  . folic acid  1 mg Oral Daily  . LORazepam  0-4 mg Oral Q6H   Followed by  . [START ON 01/29/2017] LORazepam  0-4 mg Oral Q12H  . multivitamin with minerals  1 tablet Oral Daily  . nicotine  14 mg Transdermal Daily  . thiamine  100 mg Oral Daily   Continuous Infusions: . nafcillin IV Stopped (01/28/17 1714)     LOS: 1 day    Time spent: 30 min    Janece Canterbury, MD [facility] Pager [phone]  If 7PM-7AM, please contact night-coverage [email or web address] Password [email or web address] 01/28/2017, 6:20 PM

## 2017-01-28 NOTE — Consult Note (Addendum)
[facility]  Total days of antibiotics 2        Day 2 cefazolin               Reason for Consult:SAB   Referring Physician:short  Active Problems:   Pneumonia   Bacteremia due to Staphylococcus aureus  HPI: Sherri Francis is a 31 y.o. female with history of anxiety/depression with chronic hepatitis C, alcohol use, IV drug use, initially came to the ED on 8/19 and found to have bilateral pneumonia with plans to be admitted but left AMA due to withdrawal symptoms. Her labs also found that she had MSSA bacteremia. She was asked to return the following day but had not came back to the ED until 8/22. She reports having fevers, chills, myalgias, n/v/and dairrhea where she thinks she is withdrawing. Last used heroin 2 days ago. She reports sharing needles with her boyfriend and is worried that he too is sick. She also reports right sided pleuretic chest pain.In the ED, her WBC is 14K, but hemodynamically stable. Imaging reveals numerous bilateral cavitary and non cavitary nodules c/w septic pulmonary emboli. She had TTE which shows a 1.5 x 1.2 cm vegetation on the tricuspid valve.    Past Medical History:  Diagnosis Date  . Anemia   . Anxiety   . Depression   . Hepatitis C   . IVDU (intravenous drug user)   . Suicide attempt [facility])    multiple attempts  . UTI (urinary tract infection)     Allergies: No Known Allergies  MEDICATIONS: . buprenorphine-naloxone  2 tablet Sublingual Daily  . cloNIDine  0.1 mg Oral QID   Followed by  . [START ON 01/30/2017] cloNIDine  0.1 mg Oral BH-qamhs   Followed by  . [START ON 02/01/2017] cloNIDine  0.1 mg Oral QAC breakfast  . enoxaparin (LOVENOX) injection  30 mg Subcutaneous Q24H  . folic acid  1 mg Oral Daily  . LORazepam  0-4 mg Oral Q6H   Followed by  . [START ON 01/29/2017] LORazepam  0-4 mg Oral Q12H  . multivitamin with minerals  1 tablet Oral Daily  . nicotine  14 mg Transdermal Daily  . thiamine  100 mg Oral Daily     Social History  Substance Use Topics  . Smoking status: Current Every Day Smoker    Packs/day: 0.50    Types: Cigarettes  . Smokeless tobacco: Never Used  . Alcohol use Yes     Comment: 6 beers perday- 2 years ago was last time hx of alcoholism per pt.    Family History  Problem Relation Age of Onset  . Heart disease Maternal Grandfather   . Anxiety disorder Maternal Grandfather   . Heart disease Maternal Aunt   . Alcohol abuse Father   . Drug abuse Father   . Cancer Neg Hx     Review of Systems  Constitutional: positive for fever, chills, diaphoresis, activity change, appetite change, fatigue and unexpected weight change.  HENT: Negative for congestion, sore throat, rhinorrhea, sneezing, trouble swallowing and sinus pressure.  Eyes: Negative for photophobia and visual disturbance.  Respiratory: positive for cough, chest tightness, shortness of breath, wheezing and stridor.  Cardiovascular: positive for pleuretic chest pain, but negative for palpitations and leg swelling.  Gastrointestinal: Negative for nausea, vomiting, abdominal pain, diarrhea, constipation, blood in stool, abdominal distention and anal bleeding.  Genitourinary: Negative for dysuria, hematuria, flank pain and difficulty urinating.  Musculoskeletal: Negative for myalgias, back pain, joint  swelling, arthralgias and gait problem.  Skin: Negative for color change, pallor, rash and wound.  Neurological: Negative for dizziness, tremors, weakness and light-headedness.  Hematological: Negative for adenopathy. Does not bruise/bleed easily.  Psychiatric/Behavioral: +depressed. Negative for behavioral problems, confusion, sleep disturbance, dysphoric mood, decreased concentration and agitation.     OBJECTIVE: Temp:  [98.9 F (37.2 C)-99.9 F (37.7 C)] 99.3 F (37.4 C) (08/23 1415) Pulse Rate:  [78-101] 89 (08/23 1415) Resp:  [14-28] 16 (08/23 1415) BP: (114-124)/(65-86) 122/68 (08/23 1415) SpO2:  [97 %-100 %]  98 % (08/23 1415) Weight:  [98 lb 15.8 oz (44.9 kg)] 98 lb 15.8 oz (44.9 kg) (08/22 2025) Physical Exam  Constitutional:  oriented to person, place, and time. appears well-developed and well-nourished. No distress.  HENT: [place]/AT, PERRLA, no scleral icterus Mouth/Throat: Oropharynx is clear and moist. No oropharyngeal exudate.  Cardiovascular: Normal rate, regular rhythm and normal heart sounds. Exam reveals no gallop and no friction rub.  No murmur heard.  Pulmonary/Chest: Effort normal and breath sounds normal. No respiratory distress.  has no wheezes.  Neck = supple, no nuchal rigidity Abdominal: Soft. Bowel sounds are normal.  exhibits no distension. There is no tenderness.  Lymphadenopathy: no cervical adenopathy. No axillary adenopathy Neurological: alert and oriented to person, place, and time.  Skin: Skin is warm and dry. Scattered skin lesions c/w prior injection sites to arms, forearms. Psychiatric: tearful  LABS: Results for orders placed or performed during the hospital encounter of 01/27/17 (from the past 48 hour(s))  Comprehensive metabolic panel     Status: Abnormal   Collection Time: 01/27/17  1:10 PM  Result Value Ref Range   Sodium 138 135 - 145 mmol/L   Potassium 3.0 (L) 3.5 - 5.1 mmol/L   Chloride 103 101 - 111 mmol/L   CO2 27 22 - 32 mmol/L   Glucose, Bld 137 (H) 65 - 99 mg/dL   BUN 10 6 - 20 mg/dL   Creatinine, Ser 0.38 (L) 0.44 - 1.00 mg/dL   Calcium 7.4 (L) 8.9 - 10.3 mg/dL   Total Protein 6.6 6.5 - 8.1 g/dL   Albumin 2.4 (L) 3.5 - 5.0 g/dL   AST 26 15 - 41 U/L   ALT 20 14 - 54 U/L   Alkaline Phosphatase 63 38 - 126 U/L   Total Bilirubin 0.1 (L) 0.3 - 1.2 mg/dL   GFR calc non Af Amer >60 >60 mL/min   GFR calc Af Amer >60 >60 mL/min    Comment: (NOTE) The eGFR has been calculated using the CKD EPI equation. This calculation has not been validated in all clinical situations. eGFR's persistently <60 mL/min signify possible Chronic Kidney Disease.    Anion  gap 8 5 - 15  CBC with Differential     Status: Abnormal   Collection Time: 01/27/17  1:10 PM  Result Value Ref Range   WBC 14.4 (H) 4.0 - 10.5 K/uL   RBC 3.54 (L) 3.87 - 5.11 MIL/uL   Hemoglobin 9.4 (L) 12.0 - 15.0 g/dL   HCT 28.4 (L) 36.0 - 46.0 %   MCV 80.2 78.0 - 100.0 fL   MCH 26.6 26.0 - 34.0 pg   MCHC 33.1 30.0 - 36.0 g/dL   RDW 15.0 11.5 - 15.5 %   Platelets 380 150 - 400 K/uL   Neutrophils Relative % 75 %   Lymphocytes Relative 16 %   Monocytes Relative 9 %   Eosinophils Relative 0 %   Basophils Relative 0 %  Neutro Abs 10.8 (H) 1.7 - 7.7 K/uL   Lymphs Abs 2.3 0.7 - 4.0 K/uL   Monocytes Absolute 1.3 (H) 0.1 - 1.0 K/uL   Eosinophils Absolute 0.0 0.0 - 0.7 K/uL   Basophils Absolute 0.0 0.0 - 0.1 K/uL   Smear Review MORPHOLOGY UNREMARKABLE   Lipase, blood     Status: None   Collection Time: 01/27/17  1:10 PM  Result Value Ref Range   Lipase 24 11 - 51 U/L  I-Stat CG4 Lactic Acid, ED     Status: Abnormal   Collection Time: 01/27/17  1:36 PM  Result Value Ref Range   Lactic Acid, Venous 2.07 (HH) 0.5 - 1.9 mmol/L   Comment NOTIFIED PHYSICIAN   Blood culture (routine x 2)     Status: None (Preliminary result)   Collection Time: 01/27/17  2:10 PM  Result Value Ref Range   Specimen Description BLOOD RIGHT ANTECUBITAL    Special Requests      BOTTLES DRAWN AEROBIC AND ANAEROBIC Blood Culture adequate volume   Culture  Setup Time      GRAM POSITIVE COCCI IN BOTH AEROBIC AND ANAEROBIC BOTTLES CRITICAL RESULT CALLED TO, READ BACK BY AND VERIFIED WITH: Gustavo Lah [id number] Performed at [facility], [address] [place], [place] [address]    Culture GRAM POSITIVE COCCI    Report Status PENDING   Blood culture (routine x 2)     Status: None (Preliminary result)   Collection Time: 01/27/17  2:32 PM  Result Value Ref Range   Specimen Description BLOOD LEFT HAND    Special Requests      BOTTLES DRAWN AEROBIC AND ANAEROBIC Blood Culture adequate volume    Culture  Setup Time      GRAM POSITIVE COCCI AEROBIC BOTTLE ONLY CRITICAL VALUE NOTED.  VALUE IS CONSISTENT WITH PREVIOUSLY REPORTED AND CALLED VALUE. Performed at [facility], [place] [address] [place], [place] [address]    Culture GRAM POSITIVE COCCI    Report Status PENDING   Urinalysis, Routine w reflex microscopic     Status: Abnormal   Collection Time: 01/27/17  3:16 PM  Result Value Ref Range   Color, Urine STRAW (A) YELLOW   APPearance CLEAR CLEAR   Specific Gravity, Urine 1.004 (L) 1.005 - 1.030   pH 7.0 5.0 - 8.0   Glucose, UA NEGATIVE NEGATIVE mg/dL   Hgb urine dipstick SMALL (A) NEGATIVE   Bilirubin Urine NEGATIVE NEGATIVE   Ketones, ur NEGATIVE NEGATIVE mg/dL   Protein, ur NEGATIVE NEGATIVE mg/dL   Nitrite NEGATIVE NEGATIVE   Leukocytes, UA SMALL (A) NEGATIVE   RBC / HPF 0-5 0 - 5 RBC/hpf   WBC, UA 6-30 0 - 5 WBC/hpf   Bacteria, UA RARE (A) NONE SEEN   Squamous Epithelial / LPF 0-5 (A) NONE SEEN  Urine rapid drug screen (hosp performed)     Status: Abnormal   Collection Time: 01/27/17  3:16 PM  Result Value Ref Range   Opiates POSITIVE (A) NONE DETECTED   Cocaine NONE DETECTED NONE DETECTED   Benzodiazepines NONE DETECTED NONE DETECTED   Amphetamines NONE DETECTED NONE DETECTED   Tetrahydrocannabinol NONE DETECTED NONE DETECTED   Barbiturates NONE DETECTED NONE DETECTED    Comment:        DRUG SCREEN FOR MEDICAL PURPOSES ONLY.  IF CONFIRMATION IS NEEDED FOR ANY PURPOSE, NOTIFY LAB WITHIN 5 DAYS.        LOWEST DETECTABLE LIMITS FOR URINE DRUG  SCREEN Drug Class       Cutoff (ng/mL) Amphetamine      1000 Barbiturate      200 Benzodiazepine   161 Tricyclics       096 Opiates          300 Cocaine          300 THC              50   Pregnancy, urine     Status: None   Collection Time: 01/27/17  3:16 PM  Result Value Ref Range   Preg Test, Ur NEGATIVE NEGATIVE    Comment:        THE SENSITIVITY OF THIS METHODOLOGY IS >20 mIU/mL.   Ethanol      Status: None   Collection Time: 01/27/17  5:30 PM  Result Value Ref Range   Alcohol, Ethyl (B) <5 <5 mg/dL    Comment:        LOWEST DETECTABLE LIMIT FOR SERUM ALCOHOL IS 5 mg/dL FOR MEDICAL PURPOSES ONLY   Basic metabolic panel     Status: Abnormal   Collection Time: 01/28/17  8:56 AM  Result Value Ref Range   Sodium 136 135 - 145 mmol/L   Potassium 3.4 (L) 3.5 - 5.1 mmol/L   Chloride 106 101 - 111 mmol/L   CO2 24 22 - 32 mmol/L   Glucose, Bld 114 (H) 65 - 99 mg/dL   BUN 7 6 - 20 mg/dL   Creatinine, Ser 0.45 0.44 - 1.00 mg/dL   Calcium 7.7 (L) 8.9 - 10.3 mg/dL   GFR calc non Af Amer >60 >60 mL/min   GFR calc Af Amer >60 >60 mL/min    Comment: (NOTE) The eGFR has been calculated using the CKD EPI equation. This calculation has not been validated in all clinical situations. eGFR's persistently <60 mL/min signify possible Chronic Kidney Disease.    Anion gap 6 5 - 15  CBC     Status: Abnormal   Collection Time: 01/28/17  8:56 AM  Result Value Ref Range   WBC 15.6 (H) 4.0 - 10.5 K/uL   RBC 3.52 (L) 3.87 - 5.11 MIL/uL   Hemoglobin 9.5 (L) 12.0 - 15.0 g/dL   HCT 28.7 (L) 36.0 - 46.0 %   MCV 81.5 78.0 - 100.0 fL   MCH 27.0 26.0 - 34.0 pg   MCHC 33.1 30.0 - 36.0 g/dL   RDW 15.1 11.5 - 15.5 %   Platelets 500 (H) 150 - 400 K/uL  Magnesium     Status: None   Collection Time: 01/28/17  8:56 AM  Result Value Ref Range   Magnesium 2.0 1.7 - 2.4 mg/dL  Iron and TIBC     Status: Abnormal   Collection Time: 01/28/17  8:56 AM  Result Value Ref Range   Iron 24 (L) 28 - 170 ug/dL   TIBC 183 (L) 250 - 450 ug/dL   Saturation Ratios 13 10.4 - 31.8 %   UIBC 159 ug/dL    Comment: Performed at [facility], [address], [place], [place] [address]  Ferritin     Status: None   Collection Time: 01/28/17  8:56 AM  Result Value Ref Range   Ferritin 129 11 - 307 ng/mL    Comment: Performed at [facility], [place] [address] [place], [place] [address]  Vitamin B12     Status:  None   Collection Time: 01/28/17  8:56 AM  Result Value Ref  Range   Vitamin B-12 744 180 - 914 pg/mL    Comment: (NOTE) This assay is not validated for testing neonatal or myeloproliferative syndrome specimens for Vitamin B12 levels. Performed at [facility], [place] [address], [place], [place] [address]   Folate     Status: None   Collection Time: 01/28/17  8:56 AM  Result Value Ref Range   Folate 15.2 >5.9 ng/mL    Comment: Performed at [facility] [address] [place], [place] [address]  TSH     Status: None   Collection Time: 01/28/17  8:56 AM  Result Value Ref Range   TSH 2.788 0.350 - 4.500 uIU/mL    Comment: Performed by a 3rd Generation assay with a functional sensitivity of <=0.01 uIU/mL.    MICRO: 8/19 blood cx MSSA 8/22 blood GPCC 8/23 blood pending  IMAGING: Ct Chest W Contrast  Result Date: 01/27/2017 CLINICAL DATA:  Cough and fever. Bacteremia. IV drug abuse. Abnormal chest x-ray. EXAM: CT CHEST WITH CONTRAST TECHNIQUE: Multidetector CT imaging of the chest was performed during intravenous contrast administration. CONTRAST:  82m ISOVUE-300 IOPAMIDOL (ISOVUE-300) INJECTION 61% COMPARISON:  Chest radiograph 01/24/2017 FINDINGS: Cardiovascular: The heart is normal in size. No pericardial effusion. Thoracic aorta is normal in caliber. Mediastinum/Nodes: Patient motion artifact limits assessment, patient had difficulty tolerating the exam. Enlarged right hilar node measures 10 mm. Prominent right infrahilar node measures 8 mm. There is distal esophageal wall thickening. Visualized thyroid gland is normal. Lungs/Pleura: Multiple bilateral pulmonary nodules, many of which are cavitary consistent with septic emboli. Adjacent lesions in the right upper lobe measure 1.7 and 2.3 cm respectively. Right lower lobe cavitary lesion measures 3.1 cm. Confluent and ground-glass opacity with early cavitation in the lateral segment of the right lower lobe. Cavitary left lower  lobe nodule measuring 2.2 x 1.9 cm abuts the pleura. Additional cavitary and non cavitary nodules in both lungs. Minimal right pleural thickening/ fluid. Upper Abdomen: Cyst in the right kidney. No evidence of acute abnormality. Musculoskeletal: Motion artifact limits detailed assessment. There are no acute or suspicious osseous abnormalities. IMPRESSION: 1. Innumerable bilateral cavitary nodules consistent with septic emboli, right greater than left. Minimal right pleural thickening/fluid. 2. Mild right hilar adenopathy is likely reactive. Electronically Signed   By: MJeb LeveringM.D.   On: 01/27/2017 23:11   UK[place]Abdomen Limited Ruq  Result Date: 01/27/2017 CLINICAL DATA:  Acute right upper quadrant abdominal pain. EXAM: ULTRASOUND ABDOMEN LIMITED RIGHT UPPER QUADRANT COMPARISON:  CT scan of January 13, 2016. FINDINGS: Gallbladder: No gallstones or wall thickening visualized. No sonographic Murphy sign noted by sonographer. Gallbladder does appear to be contracted. Common bile duct: Diameter: 2 mm which is within normal limits. Liver: No focal lesion identified. Within normal limits in parenchymal echogenicity. Portal vein is patent on color Doppler imaging with normal direction of blood flow towards the liver. IMPRESSION: No abnormality seen in the right upper quadrant of the abdomen. Electronically Signed   By: JMarijo Conception M.D.   On: 01/27/2017 17:39    HISTORICAL MICRO/IMAGING  Assessment/Plan: [age]yoF with history of IVDU, chronic hepatitis c  recently found to have bilateral pneumonia, on imaging more c/w pulmonary septic emboli in the setting of MSSA bacteremia/native TV endocarditis  - switch IV cefazolin to nafcillin 2gm IV q4hr - repeat blood cx tomorrow to see if bacteremia has cleared - will discuss plans for treatment, ideally would need for 6 wk with IV therapy. Concern that she  is going to leave AMA to be with her significant other. Potentially could treat with IV therapy for 5-7d  then switch to oral therapy with cipro and rifampin as an alternative.   - please check HIV ab, and HCV viral load (orders have been placed)  Opiate withdrawal = currently on clonidine and ativan.

## 2017-01-28 NOTE — Care Management Note (Signed)
Case Management Note  Patient Details  Name: Sherri Francis MRN: [id number] Date of Birth: [birth year]  Subjective/Objective:                  30 y.o. female with history of IVDA, hepatitis C, alcohol use, tobacco abuse, depression/anxiety with suicide attempts who presented to the ER on 8/19 with about a week of fever and cough.  She was found to have bilateral pneumonia and was given vancomycin and ceftriaxone.  She was going to be admitted to the hospital, but she left against medical advice because her withdrawal symptoms were severe.  She was called back on the same date because 2/2 blood cultures were positive for MSSA and advised to come back to the hospital.  She returned to the ER on 8/22.  She lives with her boyfriend who is also an IVDA in a hotel and neither of them have jobs.  She has been using less heroin over the last week she thinks and she last used 2 days ago.  She has had symptoms of withdrawal including feeling fevering, having chills, abdominal pain, body aches 9/10 throughout, nausea, vomiting, and diarrhea.  She has right flank/rib pains that are pleuritic.  10/10, worse with deep breaths.  Massaging area did not help.    Action/Plan: Date:  January 28, 2017  Chart reviewed for concurrent status and case management needs.  Will continue to follow patient progress.  Discharge Planning: following for needs  Expected discharge date: [id number]  Velva Harman, BSN, [place], [place]   Expected Discharge Date:   (unknown)               Expected Discharge Plan:  Home/Self Care  In-House Referral:     Discharge planning Services  CM Consult  Post Acute Care Choice:    Choice offered to:     DME Arranged:    DME Agency:     HH Arranged:    [place] Agency:     Status of Service:  In process, will continue to follow  If discussed at Long Length of Stay Meetings, dates discussed:    Additional Comments:  Leeroy Cha, RN 01/28/2017, 9:35 AM

## 2017-01-29 ENCOUNTER — Encounter (HOSPITAL_COMMUNITY): Payer: Self-pay | Admitting: Student

## 2017-01-29 LAB — HIV ANTIBODY (ROUTINE TESTING W REFLEX)
HIV Screen 4th Generation wRfx: NONREACTIVE
HIV Screen 4th Generation wRfx: NONREACTIVE
HIV Screen 4th Generation wRfx: NONREACTIVE

## 2017-01-29 LAB — BASIC METABOLIC PANEL
ANION GAP: 6 (ref 5–15)
BUN: 8 mg/dL (ref 6–20)
CHLORIDE: 104 mmol/L (ref 101–111)
CO2: 25 mmol/L (ref 22–32)
Calcium: 7.6 mg/dL — ABNORMAL LOW (ref 8.9–10.3)
Creatinine, Ser: 0.43 mg/dL — ABNORMAL LOW (ref 0.44–1.00)
GFR calc non Af Amer: >60 mL/min (ref >60)
Glucose, Bld: 112 mg/dL — ABNORMAL HIGH (ref 65–99)
POTASSIUM: 3.2 mmol/L — AB (ref 3.5–5.1)
Sodium: 135 mmol/L (ref 135–145)

## 2017-01-29 LAB — CBC
HCT: 29.4 % — ABNORMAL LOW (ref 36.0–46.0)
HEMOGLOBIN: 9.7 g/dL — AB (ref 12.0–15.0)
MCH: 26.8 pg (ref 26.0–34.0)
MCHC: 33 g/dL (ref 30.0–36.0)
MCV: 81.2 fL (ref 78.0–100.0)
Platelets: 463 10*3/uL — ABNORMAL HIGH (ref 150–400)
RBC: 3.62 MIL/uL — AB (ref 3.87–5.11)
RDW: 15.3 % (ref 11.5–15.5)
WBC: 11.5 10*3/uL — ABNORMAL HIGH (ref 4.0–10.5)

## 2017-01-29 LAB — MAGNESIUM: Magnesium: 2 mg/dL (ref 1.7–2.4)

## 2017-01-29 MED ORDER — POTASSIUM CHLORIDE CRYS ER 20 MEQ PO TBCR
40.0000 meq | EXTENDED_RELEASE_TABLET | Freq: Once | ORAL | Status: AC
  Administered 2017-01-29: 40 meq via ORAL
  Filled 2017-01-29: qty 2

## 2017-01-29 MED ORDER — FERROUS SULFATE 325 (65 FE) MG PO TABS
325.0000 mg | ORAL_TABLET | Freq: Every day | ORAL | Status: DC
  Administered 2017-01-30 – 2017-01-31 (×2): 325 mg via ORAL
  Filled 2017-01-29 (×2): qty 1

## 2017-01-29 NOTE — Progress Notes (Signed)
[facility] for Infectious Disease    Date of Admission:  01/27/2017   Total days of antibiotics 3        Day 2 nafcillin           ID: Skyah Hannon is a 31 y.o. 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 They are interested in mental health and addiction/SA treatment programs after she is treated for endocarditis  Patient is tearful with respect to going to nursing home  She reports that she has a high tolerance for opiates. She is tearful, feeling  Very anxious. She not been treated for anxiety/depression recently, only taking xanax  Medications:  . buprenorphine-naloxone  1 tablet Sublingual BID  . cloNIDine  0.1 mg Oral QID   Followed by  . [START ON 01/30/2017] cloNIDine  0.1 mg Oral BH-qamhs   Followed by  . [START ON 02/01/2017] cloNIDine  0.1 mg Oral QAC breakfast  . enoxaparin (LOVENOX) injection  30 mg Subcutaneous Q24H  . folic acid  1 mg Oral Daily  . LORazepam  0-4 mg Oral Q6H   Followed by  . LORazepam  0-4 mg Oral Q12H  . multivitamin with minerals  1 tablet Oral Daily  . nicotine  14 mg Transdermal Daily  . thiamine  100 mg Oral Daily    Objective: Vital signs in last 24 hours: Temp:  [98.2 F (36.8 C)-99.3 F (37.4 C)] 98.7 F (37.1 C) (08/24 0917) Pulse Rate:  [66-89] 83 (08/24 0917) Resp:  [12-16] 12 (08/24 0616) BP: (93-127)/(47-78) 127/78 (08/24 0917) SpO2:  [98 %-99 %] 99 % (08/24 0917) Weight:  [99 lb 13.9 oz (45.3 kg)] 99 lb 13.9 oz (45.3 kg) (08/23 2026) Physical Exam  Constitutional:  oriented to person, place, and time. appears well-developed and thin. tearful HENT: [place]/AT, PERRLA, no scleral icterus Mouth/Throat: Oropharynx is clear  and moist. No oropharyngeal exudate.  Cardiovascular: Normal rate, regular rhythm and normal heart sounds. Exam reveals no gallop and no friction rub.  No murmur heard.  Pulmonary/Chest: Effort normal and breath sounds normal. No respiratory distress.  Decrease breathsounds at right base Abdominal: Soft. Bowel sounds are normal.  exhibits no distension. There is no tenderness.  Neurological: alert and oriented to person, place, and time.  Skin: Skin is warm and dry. No rash noted. No erythema.  Psychiatric: tearful   Lab Results  Recent Labs  01/28/17 0856 01/29/17 0618  WBC 15.6* 11.5*  HGB 9.5* 9.7*  HCT 28.7* 29.4*  NA 136 135  K 3.4* 3.2*  CL 106 104  CO2 24 25  BUN 7 8  CREATININE 0.45 0.43*   Liver Panel  Recent Labs  01/27/17 1310  PROT 6.6  ALBUMIN 2.4*  AST 26  ALT 20  ALKPHOS 63  BILITOT 0.1*   Microbiology: 8/22 blood cx [facility] 8/23 blood cx pending 8/24 blood cx pending Studies/Results: Ct Chest W Contrast  Result Date: 01/27/2017 CLINICAL DATA:  Cough and fever. Bacteremia. IV drug abuse. Abnormal chest x-ray. EXAM: CT CHEST WITH CONTRAST TECHNIQUE: Multidetector CT imaging of the chest was performed during intravenous contrast administration. CONTRAST:  54mL ISOVUE-300 IOPAMIDOL (ISOVUE-300) INJECTION 61% COMPARISON:  Chest radiograph 01/24/2017 FINDINGS: Cardiovascular: The heart is  normal in size. No pericardial effusion. Thoracic aorta is normal in caliber. Mediastinum/Nodes: Patient motion artifact limits assessment, patient had difficulty tolerating the exam. Enlarged right hilar node measures 10 mm. Prominent right infrahilar node measures 8 mm. There is distal esophageal wall thickening. Visualized thyroid gland is normal. Lungs/Pleura: Multiple bilateral pulmonary nodules, many of which are cavitary consistent with septic emboli. Adjacent lesions in the right upper lobe measure 1.7 and 2.3 cm respectively. Right lower lobe cavitary lesion measures 3.1  cm. Confluent and ground-glass opacity with early cavitation in the lateral segment of the right lower lobe. Cavitary left lower lobe nodule measuring 2.2 x 1.9 cm abuts the pleura. Additional cavitary and non cavitary nodules in both lungs. Minimal right pleural thickening/ fluid. Upper Abdomen: Cyst in the right kidney. No evidence of acute abnormality. Musculoskeletal: Motion artifact limits detailed assessment. There are no acute or suspicious osseous abnormalities. IMPRESSION: 1. Innumerable bilateral cavitary nodules consistent with septic emboli, right greater than left. Minimal right pleural thickening/fluid. 2. Mild right hilar adenopathy is likely reactive. Electronically Signed   By: Jeb Levering M.D.   On: 01/27/2017 23:11   US Abdomen Limited Ruq  Result Date: 01/27/2017 CLINICAL DATA:  Acute right upper quadrant abdominal pain. EXAM: ULTRASOUND ABDOMEN LIMITED RIGHT UPPER QUADRANT COMPARISON:  CT scan of January 13, 2016. FINDINGS: Gallbladder: No gallstones or wall thickening visualized. No sonographic Murphy sign noted by sonographer. Gallbladder does appear to be contracted. Common bile duct: Diameter: 2 mm which is within normal limits. Liver: No focal lesion identified. Within normal limits in parenchymal echogenicity. Portal vein is patent on color Doppler imaging with normal direction of blood flow towards the liver. IMPRESSION: No abnormality seen in the right upper quadrant of the abdomen. Electronically Signed   By: Marijo Conception, M.D.   On: 01/27/2017 17:39     Assessment/Plan: Diahn Waidelich is a 31 y.o. 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 May benefit from starting intervention as inpatient and assess if she would be a candidate for inpatient behaviorial health program after she is finished with medical management of endocarditis  -opiate dependence = started on suboxone for now which is greatly appreciated in order to treat underling endocarditis  Talesha Ellithorpe, [facility] for Infectious Diseases Cell: [phone] Pager: [phone]  01/29/2017, 11:34 AM

## 2017-01-29 NOTE — Progress Notes (Signed)
PROGRESS NOTE  Sherri Francis  OIZ:[id number] DOB: Apr 28, 1986 DOA: 01/27/2017 PCP: Patient, No Pcp Per  Brief Narrative:   Sherri Francis is a 31 y.o. female with history of IVDA, hepatitis C, alcohol use, tobacco abuse, depression/anxiety with suicide attempts who presented to the ER on 8/19 with about a week of fever and cough.  She was found to have bilateral pneumonia and was given vancomycin and ceftriaxone.  She was going to be admitted to the hospital, but she left against medical advice because her withdrawal symptoms were severe.  She was called back on the same date because 2/2 blood cultures were positive for MSSA and advised to come back to the hospital.  She returned to the ER on 8/22.  She has septic pulmonary embolic with a 1.5 x 3.3AS tricuspid valve vegetation with mild regurgitation.    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  -  LFTs wnl -  lipase 24 -  RUQ [place]:  No abnormality within the liver  IVDA, patient at risk for leaving AMA again -  continue suboxone 8mg  BID -  continue Clonidine detox -  SW consult: SNF placement -  UDS positive for opiates -  HIV NR -  Hep C pending  Reported distant ETOH use -  ETOH level negative -  CIWA protocol -  Thiamine, folate, MVI  Hypokalemia -  Oral potassium repletion  Anemia of chronic disease although may have some iron deficiency as well -  Start oral iron supplementation  Hepatitis C  -  LFTs wnl  DVT prophylaxis: lovenox  Code Status: full code Family Communication:  Patient alone.   Disposition Plan:  Will likely need  SNF at discharge for long course of IV antibiotics   Consultants:   ID  Procedures:  CT chest ECHO  Antimicrobials:  Anti-infectives    Start     Dose/Rate Route Frequency Ordered Stop   01/28/17 1600  nafcillin 2 g in dextrose 5 % 100 mL IVPB     2 g 200 mL/hr over 30 Minutes Intravenous Every 4 hours 01/28/17 1445     01/28/17 1445  nafcillin injection 2 g  Status:  Discontinued     2 g Intravenous Every 4 hours 01/28/17 1435 01/28/17 1444   01/27/17 2200  ceFAZolin (ANCEF) IVPB 2g/100 mL premix  Status:  Discontinued     2 g 200 mL/hr over 30 Minutes Intravenous Every 8 hours 01/27/17 1702 01/28/17 1435   01/27/17 1400  vancomycin (VANCOCIN) IVPB 1000 mg/200 mL premix     1,000 mg 200 mL/hr over 60 Minutes Intravenous  Once 01/27/17 1348 01/27/17 1606       Subjective:  Severe all over pains.  Threatened to leave AMA unless I gave her IV dilaudid.  Pain with deep breaths, muscle aches.  Refused to talk after initial history.    Objective: Vitals:   01/28/17 2026 01/29/17 0616 01/29/17 0917 01/29/17 1400  BP: (!) 98/54 (!) 95/47 127/78 106/64  Pulse: 67 66 83 94  Resp: 14 12  Temp: 98.2 F (36.8 C) 99.3 F (37.4 C) 98.7 F (37.1 C) 98.3 F (36.8 C)  TempSrc: Oral Oral Oral Oral  SpO2: 99% 98% 99% 99%  Weight: 45.3 kg (99 lb 13.9 oz)     Height:        Intake/Output Summary (Last 24 hours) at 01/29/17 1830 Last data filed at 01/29/17 0411  Gross per 24 hour  Intake              400 ml  Output                0 ml  Net              400 ml   Filed Weights   01/27/17 1252 01/27/17 2025 01/28/17 2026  Weight: 44.9 kg (99 lb) 44.9 kg (98 lb 15.8 oz) 45.3 kg (99 lb 13.9 oz)    Examination:  General exam:  Thin female.  No acute distress.  Lying on side, asleep but arouseable HEENT:  NCAT, MMM Respiratory system:  CTAB  Cardiovascular system: Regular rate and rhythm, normal S1/S2. No murmurs, rubs, gallops or clicks.  Warm extremities Gastrointestinal  system: Normal active bowel sounds, soft, nondistended, nontender. MSK:  Normal tone and bulk, no lower extremity edema.  Track marks on bilateral arms with bruising Neuro:  Grossly intact    Data Reviewed: I have personally reviewed following labs and imaging studies  CBC:  Recent Labs Lab 01/24/17 0823 01/27/17 1310 01/28/17 0856 01/29/17 0618  WBC 15.8* 14.4* 15.6* 11.5*  NEUTROABS 12.8* 10.8*  --   --   HGB 10.8* 9.4* 9.5* 9.7*  HCT 31.6* 28.4* 28.7* 29.4*  MCV 79.4 80.2 81.5 81.2  PLT 121* 380 500* 235*   Basic Metabolic Panel:  Recent Labs Lab 01/24/17 0823 01/24/17 0917 01/27/17 1310 01/28/17 0856 01/29/17 0618  NA 131*  --  138 136 135  K 2.9*  --  3.0* 3.4* 3.2*  CL 96*  --  103 106 104  CO2 25  --  27 24 25   GLUCOSE 125*  --  137* 114* 112*  BUN 9  --  10 7 8   CREATININE 0.53  --  0.38* 0.45 0.43*  CALCIUM 8.0*  --  7.4* 7.7* 7.6*  MG  --  2.1  --  2.0 2.0   GFR: Estimated Creatinine Clearance: 74.2 mL/min (A) (by C-G formula based on SCr of 0.43 mg/dL (L)). Liver Function Tests:  Recent Labs Lab 01/24/17 0823 01/27/17 1310  AST 37 26  ALT 29 20  ALKPHOS 89 63  BILITOT 0.7 0.1*  PROT 7.0 6.6  ALBUMIN 2.8* 2.4*    Recent Labs Lab 01/24/17 0917 01/27/17 1310  LIPASE 24 24   No results for input(s): AMMONIA in the last 168 hours. Coagulation Profile: No results for input(s): INR, PROTIME in the last 168 hours. Cardiac Enzymes: No results for input(s): CKTOTAL, CKMB, CKMBINDEX, TROPONINI in the last 168 hours. BNP (last 3 results) No results for input(s): PROBNP in the last 8760 hours. HbA1C: No results for input(s): HGBA1C in the last 72 hours. CBG: No results for input(s): GLUCAP in the last 168 hours. Lipid Profile: No results for input(s): CHOL, HDL, LDLCALC, TRIG, CHOLHDL, LDLDIRECT in the last 72 hours. Thyroid Function Tests:  Recent Labs  01/28/17 0856  TSH 2.788   Anemia Panel:  Recent Labs  01/28/17 0856    VITAMINB12 744  FOLATE 15.2  FERRITIN 129  TIBC 183*  IRON 24*  Urine analysis:    Component Value Date/Time   COLORURINE STRAW (A) 01/27/2017 1516   APPEARANCEUR CLEAR 01/27/2017 1516   LABSPEC 1.004 (L) 01/27/2017 1516   PHURINE 7.0 01/27/2017 1516   GLUCOSEU NEGATIVE 01/27/2017 1516   HGBUR SMALL (A) 01/27/2017 1516   BILIRUBINUR NEGATIVE 01/27/2017 1516   KETONESUR NEGATIVE 01/27/2017 1516   PROTEINUR NEGATIVE 01/27/2017 1516   UROBILINOGEN 0.2 10/15/2012 1341   NITRITE NEGATIVE 01/27/2017 1516   LEUKOCYTESUR SMALL (A) 01/27/2017 1516   Sepsis Labs: @LABRCNTIP (procalcitonin:4,lacticidven:4)  ) Recent Results (from the past 240 hour(s))  Culture, blood (routine x 2)     Status: Abnormal   Collection Time: 01/24/17  8:23 AM  Result Value Ref Range Status   Specimen Description BLOOD LEFT FOREARM  Final   Special Requests   Final    BOTTLES DRAWN AEROBIC AND ANAEROBIC Blood Culture adequate volume   Culture  Setup Time   Final    IN BOTH AEROBIC AND ANAEROBIC BOTTLES GRAM POSITIVE COCCI IN CLUSTERS CRITICAL RESULT CALLED TO, READ BACK BY AND VERIFIED WITH: TO SMAYNARD(RN) BY TCLEVELAND 01/24/2017 AT 11:52PM  CRITICAL RESULT CALLED TO, READ BACK BY AND VERIFIED WITH: Hillary Bow AT [place] 8299 01/25/17 BY L BENFIELD Performed at [facility], [place] [address] [place], [place] [address]    Culture STAPHYLOCOCCUS AUREUS (A)  Final   Report Status 01/26/2017 FINAL  Final   Organism ID, Bacteria STAPHYLOCOCCUS AUREUS  Final      Susceptibility   Staphylococcus aureus - MIC*    CIPROFLOXACIN <=0.5 SENSITIVE Sensitive     ERYTHROMYCIN <=0.25 SENSITIVE Sensitive     GENTAMICIN <=0.5 SENSITIVE Sensitive     OXACILLIN 0.5 SENSITIVE Sensitive     TETRACYCLINE <=1 SENSITIVE Sensitive     VANCOMYCIN <=0.5 SENSITIVE Sensitive     TRIMETH/SULFA <=10 SENSITIVE Sensitive     CLINDAMYCIN <=0.25 SENSITIVE Sensitive     RIFAMPIN <=0.5 SENSITIVE Sensitive     Inducible  Clindamycin NEGATIVE Sensitive     * STAPHYLOCOCCUS AUREUS  Blood Culture ID Panel (Reflexed)     Status: Abnormal   Collection Time: 01/24/17  8:23 AM  Result Value Ref Range Status   Enterococcus species NOT DETECTED NOT DETECTED Final   Vancomycin resistance NOT DETECTED NOT DETECTED Final   Listeria monocytogenes NOT DETECTED NOT DETECTED Final   Staphylococcus species DETECTED (A) NOT DETECTED Final    Comment: CRITICAL RESULT CALLED TO, READ BACK BY AND VERIFIED WITH: TO  SMAYNARD(RN) BY TCLEVELAND 01/24/2017 AT 11:52PM    Staphylococcus aureus DETECTED (A) NOT DETECTED Final    Comment: CRITICAL RESULT CALLED TO, READ BACK BY AND VERIFIED WITH: TO  SMAYNARD(RN) BY TCLEVELAND 01/24/2017 AT 11:52PM    Methicillin resistance NOT DETECTED NOT DETECTED Final   Streptococcus species NOT DETECTED NOT DETECTED Final   Streptococcus agalactiae NOT DETECTED NOT DETECTED Final   Streptococcus pneumoniae NOT DETECTED NOT DETECTED Final   Streptococcus pyogenes NOT DETECTED NOT DETECTED Final   Acinetobacter baumannii NOT DETECTED NOT DETECTED Final   Enterobacteriaceae species NOT DETECTED NOT DETECTED Final   Enterobacter cloacae complex NOT DETECTED NOT DETECTED Final   Escherichia coli NOT DETECTED NOT DETECTED Final   Klebsiella oxytoca NOT DETECTED NOT DETECTED Final   Klebsiella pneumoniae NOT DETECTED NOT DETECTED Final   Proteus species NOT DETECTED NOT DETECTED Final   Serratia marcescens NOT DETECTED NOT DETECTED Final   Carbapenem resistance NOT DETECTED NOT DETECTED Final   Haemophilus influenzae NOT  DETECTED NOT DETECTED Final   Neisseria meningitidis NOT DETECTED NOT DETECTED Final   Pseudomonas aeruginosa NOT DETECTED NOT DETECTED Final   Candida albicans NOT DETECTED NOT DETECTED Final   Candida glabrata NOT DETECTED NOT DETECTED Final   Candida krusei NOT DETECTED NOT DETECTED Final   Candida parapsilosis NOT DETECTED NOT DETECTED Final   Candida tropicalis NOT DETECTED  NOT DETECTED Final    Comment: Performed at [facility], [place] [address] [place], [place] [address]  Culture, blood (routine x 2)     Status: Abnormal   Collection Time: 01/24/17  8:41 AM  Result Value Ref Range Status   Specimen Description BLOOD RIGHT HAND  Final   Special Requests IN PEDIATRIC BOTTLE Blood Culture adequate volume  Final   Culture  Setup Time   Final    IN PEDIATRIC BOTTLE GRAM POSITIVE COCCI IN CLUSTERS CRITICAL RESULT CALLED TO, READ BACK BY AND VERIFIED WITH: D FRANCIS,RN AT [place] 3536 01/25/17 BY L BENFIELD    Culture (A)  Final    STAPHYLOCOCCUS AUREUS SUSCEPTIBILITIES PERFORMED ON PREVIOUS CULTURE WITHIN THE LAST 5 DAYS.    Report Status 01/27/2017 FINAL  Final  MRSA PCR Screening     Status: None   Collection Time: 01/24/17 10:34 AM  Result Value Ref Range Status   MRSA by PCR NEGATIVE NEGATIVE Final    Comment:        The GeneXpert MRSA Assay (FDA approved for NASAL specimens only), is one component of a comprehensive MRSA colonization surveillance program. It is not intended to diagnose MRSA infection nor to guide or monitor treatment for MRSA infections.   Blood culture (routine x 2)     Status: Abnormal (Preliminary result)   Collection Time: 01/27/17  2:10 PM  Result Value Ref Range Status   Specimen Description BLOOD RIGHT ANTECUBITAL  Final   Special Requests   Final    BOTTLES DRAWN AEROBIC AND ANAEROBIC Blood Culture adequate volume   Culture  Setup Time   Final    GRAM POSITIVE COCCI IN BOTH AEROBIC AND ANAEROBIC BOTTLES CRITICAL RESULT CALLED TO, READ BACK BY AND VERIFIED WITH: PHARMD E JACKSON [id number] MLM    Culture (A)  Final    STAPHYLOCOCCUS AUREUS CULTURE REINCUBATED FOR BETTER GROWTH Performed at [facility], [place] [address], [place], [place] [address]    Report Status PENDING  Incomplete  Blood culture (routine x 2)     Status: None (Preliminary result)   Collection Time: 01/27/17  2:32 PM  Result  Value Ref Range Status   Specimen Description BLOOD LEFT HAND  Final   Special Requests   Final    BOTTLES DRAWN AEROBIC AND ANAEROBIC Blood Culture adequate volume   Culture  Setup Time   Final    GRAM POSITIVE COCCI IN BOTH AEROBIC AND ANAEROBIC BOTTLES CRITICAL RESULT CALLED TO, READ BACK BY AND VERIFIED WITH: Irwin Brakeman AT 5093 01/29/17 BY L BENFIELD    Culture   Final    GRAM POSITIVE COCCI CULTURE REINCUBATED FOR BETTER GROWTH Performed at [facility], [place] [address], [place], [place] [address]    Report Status PENDING  Incomplete  Culture, blood (single) w Reflex to ID Panel     Status: None (Preliminary result)   Collection Time: 01/28/17  8:56 AM  Result Value Ref Range Status   Specimen Description BLOOD RIGHT HAND  Final   Special Requests IN PEDIATRIC BOTTLE Blood Culture adequate  volume  Final   Culture   Final    NO GROWTH 1 DAY Performed at [facility], [place] [address], [place], [place] [address]    Report Status PENDING  Incomplete      Radiology Studies: Ct Chest W Contrast  Result Date: 01/27/2017 CLINICAL DATA:  Cough and fever. Bacteremia. IV drug abuse. Abnormal chest x-ray. EXAM: CT CHEST WITH CONTRAST TECHNIQUE: Multidetector CT imaging of the chest was performed during intravenous contrast administration. CONTRAST:  29mL ISOVUE-300 IOPAMIDOL (ISOVUE-300) INJECTION 61% COMPARISON:  Chest radiograph 01/24/2017 FINDINGS: Cardiovascular: The heart is normal in size. No pericardial effusion. Thoracic aorta is normal in caliber. Mediastinum/Nodes: Patient motion artifact limits assessment, patient had difficulty tolerating the exam. Enlarged right hilar node measures 10 mm. Prominent right infrahilar node measures 8 mm. There is distal esophageal wall thickening. Visualized thyroid gland is normal. Lungs/Pleura: Multiple bilateral pulmonary nodules, many of which are cavitary consistent with septic emboli. Adjacent lesions in the right upper lobe  measure 1.7 and 2.3 cm respectively. Right lower lobe cavitary lesion measures 3.1 cm. Confluent and ground-glass opacity with early cavitation in the lateral segment of the right lower lobe. Cavitary left lower lobe nodule measuring 2.2 x 1.9 cm abuts the pleura. Additional cavitary and non cavitary nodules in both lungs. Minimal right pleural thickening/ fluid. Upper Abdomen: Cyst in the right kidney. No evidence of acute abnormality. Musculoskeletal: Motion artifact limits detailed assessment. There are no acute or suspicious osseous abnormalities. IMPRESSION: 1. Innumerable bilateral cavitary nodules consistent with septic emboli, right greater than left. Minimal right pleural thickening/fluid. 2. Mild right hilar adenopathy is likely reactive. Electronically Signed   By: Jeb Levering M.D.   On: 01/27/2017 23:11     Scheduled Meds: . buprenorphine-naloxone  1 tablet Sublingual BID  . cloNIDine  0.1 mg Oral QID   Followed by  . [START ON 01/30/2017] cloNIDine  0.1 mg Oral BH-qamhs   Followed by  . [START ON 02/01/2017] cloNIDine  0.1 mg Oral QAC breakfast  . enoxaparin (LOVENOX) injection  30 mg Subcutaneous Q24H  . folic acid  1 mg Oral Daily  . LORazepam  0-4 mg Oral Q12H  . multivitamin with minerals  1 tablet Oral Daily  . nicotine  14 mg Transdermal Daily  . potassium chloride  40 mEq Oral Once  . thiamine  100 mg Oral Daily   Continuous Infusions: . nafcillin IV Stopped (01/29/17 1811)     LOS: 2 days    Time spent: 30 min    Janece Canterbury, MD [facility] Pager [phone]  If 7PM-7AM, please contact night-coverage [email or web address] Password [email or web address] 01/29/2017, 6:30 PM

## 2017-01-30 DIAGNOSIS — B1920 Unspecified viral hepatitis C without hepatic coma: Secondary | ICD-10-CM

## 2017-01-30 DIAGNOSIS — F1994 Other psychoactive substance use, unspecified with psychoactive substance-induced mood disorder: Secondary | ICD-10-CM

## 2017-01-30 DIAGNOSIS — R05 Cough: Secondary | ICD-10-CM

## 2017-01-30 DIAGNOSIS — J189 Pneumonia, unspecified organism: Secondary | ICD-10-CM

## 2017-01-30 DIAGNOSIS — F17210 Nicotine dependence, cigarettes, uncomplicated: Secondary | ICD-10-CM

## 2017-01-30 DIAGNOSIS — R5382 Chronic fatigue, unspecified: Secondary | ICD-10-CM

## 2017-01-30 DIAGNOSIS — Z818 Family history of other mental and behavioral disorders: Secondary | ICD-10-CM

## 2017-01-30 DIAGNOSIS — R509 Fever, unspecified: Secondary | ICD-10-CM

## 2017-01-30 DIAGNOSIS — F11288 Opioid dependence with other opioid-induced disorder: Secondary | ICD-10-CM

## 2017-01-30 DIAGNOSIS — F418 Other specified anxiety disorders: Secondary | ICD-10-CM

## 2017-01-30 DIAGNOSIS — F1120 Opioid dependence, uncomplicated: Secondary | ICD-10-CM

## 2017-01-30 DIAGNOSIS — Z813 Family history of other psychoactive substance abuse and dependence: Secondary | ICD-10-CM

## 2017-01-30 DIAGNOSIS — F1123 Opioid dependence with withdrawal: Secondary | ICD-10-CM

## 2017-01-30 DIAGNOSIS — R7881 Bacteremia: Secondary | ICD-10-CM

## 2017-01-30 DIAGNOSIS — Z811 Family history of alcohol abuse and dependence: Secondary | ICD-10-CM

## 2017-01-30 DIAGNOSIS — R5381 Other malaise: Secondary | ICD-10-CM

## 2017-01-30 LAB — CBC
HEMATOCRIT: 30.8 % — AB (ref 36.0–46.0)
Hemoglobin: 10 g/dL — ABNORMAL LOW (ref 12.0–15.0)
MCH: 26.6 pg (ref 26.0–34.0)
MCHC: 32.5 g/dL (ref 30.0–36.0)
MCV: 81.9 fL (ref 78.0–100.0)
PLATELETS: 543 10*3/uL — AB (ref 150–400)
RBC: 3.76 MIL/uL — AB (ref 3.87–5.11)
RDW: 15.1 % (ref 11.5–15.5)
WBC: 11.7 10*3/uL — ABNORMAL HIGH (ref 4.0–10.5)

## 2017-01-30 LAB — BASIC METABOLIC PANEL
Anion gap: 7 (ref 5–15)
BUN: 8 mg/dL (ref 6–20)
CHLORIDE: 104 mmol/L (ref 101–111)
CO2: 23 mmol/L (ref 22–32)
Calcium: 7.8 mg/dL — ABNORMAL LOW (ref 8.9–10.3)
Creatinine, Ser: 0.46 mg/dL (ref 0.44–1.00)
GFR calc Af Amer: >60 mL/min (ref >60)
GFR calc non Af Amer: >60 mL/min (ref >60)
Glucose, Bld: 104 mg/dL — ABNORMAL HIGH (ref 65–99)
Potassium: 4 mmol/L (ref 3.5–5.1)
Sodium: 134 mmol/L — ABNORMAL LOW (ref 135–145)

## 2017-01-30 LAB — CULTURE, BLOOD (ROUTINE X 2): SPECIAL REQUESTS: ADEQUATE

## 2017-01-30 LAB — MAGNESIUM: Magnesium: 1.8 mg/dL (ref 1.7–2.4)

## 2017-01-30 MED ORDER — POTASSIUM CHLORIDE CRYS ER 20 MEQ PO TBCR
40.0000 meq | EXTENDED_RELEASE_TABLET | Freq: Every day | ORAL | Status: AC
  Administered 2017-01-30 – 2017-02-01 (×2): 40 meq via ORAL
  Filled 2017-01-30 (×3): qty 2

## 2017-01-30 MED ORDER — MAGNESIUM SULFATE 2 GM/50ML IV SOLN
2.0000 g | Freq: Once | INTRAVENOUS | Status: AC
  Administered 2017-01-30: 2 g via INTRAVENOUS
  Filled 2017-01-30: qty 50

## 2017-01-30 MED ORDER — BUPRENORPHINE HCL-NALOXONE HCL 8-2 MG SL SUBL: 2.0000 | SUBLINGUAL_TABLET | Freq: Two times a day (BID) | SUBLINGUAL | Status: DC

## 2017-01-30 MED ORDER — BUPRENORPHINE HCL-NALOXONE HCL 8-2 MG SL SUBL
2.0000 | SUBLINGUAL_TABLET | Freq: Every day | SUBLINGUAL | Status: DC
  Administered 2017-01-31: 2 via SUBLINGUAL
  Filled 2017-01-30 (×2): qty 1

## 2017-01-30 MED ORDER — BUPRENORPHINE HCL-NALOXONE HCL 8-2 MG SL SUBL
1.0000 | SUBLINGUAL_TABLET | Freq: Once | SUBLINGUAL | Status: AC
  Administered 2017-01-30: 1 via SUBLINGUAL
  Filled 2017-01-30: qty 1

## 2017-01-30 MED ORDER — BUPRENORPHINE HCL-NALOXONE HCL 2-0.5 MG SL SUBL
2.0000 | SUBLINGUAL_TABLET | Freq: Every day | SUBLINGUAL | Status: DC
  Administered 2017-01-30: 2 via SUBLINGUAL
  Filled 2017-01-30: qty 2

## 2017-01-30 MED ORDER — VENLAFAXINE HCL ER 37.5 MG PO CP24
37.5000 mg | ORAL_CAPSULE | Freq: Every day | ORAL | Status: DC
  Administered 2017-01-31: 37.5 mg via ORAL
  Filled 2017-01-30: qty 1

## 2017-01-30 NOTE — Progress Notes (Signed)
PROGRESS NOTE  Sherri Francis  AST:[id number] DOB: 09/25/1985 DOA: 01/27/2017 PCP: Patient, No Pcp Per  Brief Narrative:   Sherri Francis is a 31 y.o. female with history of IVDA, hepatitis C, alcohol use, tobacco abuse, depression/anxiety with suicide attempts who presented to the ER on 8/19 with about a week of fever and cough.  She was found to have bilateral pneumonia and was given vancomycin and ceftriaxone.  She was going to be admitted to the hospital, but she left against medical advice because her withdrawal symptoms were severe.  She was called back on the same date because 2/2 blood cultures were positive for MSSA and advised to come back to the hospital.  She returned to the ER on 8/22.  She has septic pulmonary embolic with a 1.5 x 9.8XQ tricuspid valve vegetation with mild regurgitation.    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 Evette Doffing:   -  Day 3:  Change to suboxone 16 mg qAM and 4 mg qPM and increase by 2-4mg  daily as needed for symptom relief, maximum dose of 32 mg daily -  Clonidine detox continues -  SW consult -  UDS positive for opiates -  HIV and hep C pending  Reported distant ETOH use -  ETOH level negative -  CIWA protocol almost complete -  Thiamine, folate, MVI  Hypokalemia -  Oral potassium repletion  Iron deficiency vs.  Anemia of chronic disease -  Start iron supplementation  Hepatitis C  -  LFTs wnl  Hypokalemia, mild -  Oral potassium repletion  DVT prophylaxis: lovenox  Code Status: full code Family Communication:  Patient alone.   Disposition Plan: will likely need SNF at discharge for long course of IV antibiotics   Consultants:   ID  Psychiatry  Procedures:  CT chest ECHO  Antimicrobials:  Anti-infectives    Start     Dose/Rate Route Frequency Ordered Stop   01/28/17 1600  nafcillin 2 g in dextrose 5 % 100 mL IVPB     2 g 200 mL/hr over 30 Minutes Intravenous Every 4 hours 01/28/17 1445     01/28/17 1445  nafcillin injection 2 g  Status:  Discontinued     2 g Intravenous Every 4 hours 01/28/17 1435 01/28/17 1444   01/27/17 2200  ceFAZolin (ANCEF) IVPB 2g/100 mL premix  Status:  Discontinued     2 g 200 mL/hr over 30 Minutes Intravenous Every 8 hours 01/27/17 1702 01/28/17 1435   01/27/17 1400  vancomycin (VANCOCIN) IVPB 1000 mg/200 mL premix     1,000 mg 200 mL/hr over 60 Minutes Intravenous  Once 01/27/17 1348 01/27/17 1606       Subjective:  Interested in stopping heroin and getting into a methadone program.  Still having symptoms of withdrawal such as sweats, anxiety,  and diarrhea.      Objective: Vitals:   01/29/17 1400 01/29/17 2020 01/30/17 0605 01/30/17 1315  BP: 106/64 109/60 110/70 130/68  Pulse: 94 86 74 100  Resp:  14 14 16   Temp: 98.3 F (36.8 C) 98.5 F (36.9 C) 98.4 F (36.9 C) 98.5 F (36.9 C)  TempSrc: Oral Oral Oral Oral  SpO2: 99% 98% 98% 100%  Weight:    50.9 kg (112 lb 3.4 oz)  Height:        Intake/Output Summary (Last 24 hours) at 01/30/17 1755 Last data filed at 01/30/17 0915  Gross per 24 hour  Intake              520 ml  Output                1 ml  Net              519 ml   Filed Weights   01/27/17 2025 01/28/17 2026 01/30/17 1315  Weight: 44.9 kg (98 lb 15.8 oz) 45.3 kg (99 lb 13.9 oz) 50.9 kg (112 lb 3.4 oz)     Examination:  General exam:  Thin female.  No acute distress.  HEENT:  NCAT, MMM Respiratory system:  CTAB  Cardiovascular system: Tachycardic, normal S1/S2. No murmurs, rubs, gallops or clicks.  Warm extremities Gastrointestinal system: Normal active bowel sounds, soft, nondistended, mild diffuse TTP without rebound  MSK:  Normal tone and bulk, no lower extremity edema.  Track marks on bilateral arms with bruising Neuro:  Grossly intact    Data Reviewed: I have personally reviewed following labs and imaging studies  CBC:  Recent Labs Lab 01/24/17 0823 01/27/17 1310 01/28/17 0856 01/29/17 0618 01/30/17 0850  WBC 15.8* 14.4* 15.6* 11.5* 11.7*  NEUTROABS 12.8* 10.8*  --   --   --   HGB 10.8* 9.4* 9.5* 9.7* 10.0*  HCT 31.6* 28.4* 28.7* 29.4* 30.8*  MCV 79.4 80.2 81.5 81.2 81.9  PLT 121* 380 500* 463* 852*   Basic Metabolic Panel:  Recent Labs Lab 01/24/17 0823 01/24/17 0917 01/27/17 1310 01/28/17 0856 01/29/17 0618 01/30/17 0850  NA 131*  --  138 136 135 134*  K 2.9*  --  3.0* 3.4* 3.2* 4.0  CL 96*  --  103 106 104 104  CO2 25  --  27 24 25 23   GLUCOSE 125*  --  137* 114* 112* 104*  BUN 9  --  10 7 8 8   CREATININE 0.53  --  0.38* 0.45 0.43* 0.46  CALCIUM 8.0*  --  7.4* 7.7* 7.6* 7.8*  MG  --  2.1  --  2.0 2.0 1.8   GFR: Estimated Creatinine Clearance: 78.3 mL/min (by C-G formula based on SCr of 0.46 mg/dL). Liver Function Tests:  Recent Labs Lab 01/24/17 0823 01/27/17 1310  AST 37 26  ALT 29 20  ALKPHOS 89 63  BILITOT 0.7 0.1*  PROT 7.0 6.6  ALBUMIN 2.8* 2.4*    Recent Labs Lab 01/24/17 0917 01/27/17 1310  LIPASE 24 24   No results for input(s): AMMONIA in the last 168 hours. Coagulation Profile: No results for input(s): INR, PROTIME in the last 168 hours. Cardiac Enzymes: No results for input(s): CKTOTAL, CKMB, CKMBINDEX, TROPONINI in the last 168 hours. BNP (last 3 results) No results for input(s): PROBNP in the last 8760  hours. HbA1C: No results for input(s): HGBA1C in the last 72 hours. CBG: No results for input(s): GLUCAP in the last 168 hours.  Lipid Profile: No results for input(s): CHOL, HDL, LDLCALC, TRIG, CHOLHDL, LDLDIRECT in the last 72 hours. Thyroid Function Tests:  Recent Labs  01/28/17 0856  TSH 2.788   Anemia Panel:  Recent Labs  01/28/17 0856  VITAMINB12 744  FOLATE 15.2  FERRITIN 129  TIBC 183*  IRON 24*   Urine analysis:    Component Value Date/Time   COLORURINE STRAW (A) 01/27/2017 1516   APPEARANCEUR CLEAR 01/27/2017 1516   LABSPEC 1.004 (L) 01/27/2017 1516   PHURINE 7.0 01/27/2017 1516   GLUCOSEU NEGATIVE 01/27/2017 1516   HGBUR SMALL (A) 01/27/2017 1516   BILIRUBINUR NEGATIVE 01/27/2017 1516   KETONESUR NEGATIVE 01/27/2017 1516   PROTEINUR NEGATIVE 01/27/2017 1516   UROBILINOGEN 0.2 10/15/2012 1341   NITRITE NEGATIVE 01/27/2017 1516   LEUKOCYTESUR SMALL (A) 01/27/2017 1516   Sepsis Labs: @LABRCNTIP (procalcitonin:4,lacticidven:4)  ) Recent Results (from the past 240 hour(s))  Culture, blood (routine x 2)     Status: Abnormal   Collection Time: 01/24/17  8:23 AM  Result Value Ref Range Status   Specimen Description BLOOD LEFT FOREARM  Final   Special Requests   Final    BOTTLES DRAWN AEROBIC AND ANAEROBIC Blood Culture adequate volume   Culture  Setup Time   Final    IN BOTH AEROBIC AND ANAEROBIC BOTTLES GRAM POSITIVE COCCI IN CLUSTERS CRITICAL RESULT CALLED TO, READ BACK BY AND VERIFIED WITH: TO SMAYNARD(RN) BY TCLEVELAND 01/24/2017 AT 11:52PM  CRITICAL RESULT CALLED TO, READ BACK BY AND VERIFIED WITH: Hillary Bow AT [place] 8242 01/25/17 BY L BENFIELD Performed at [facility], [place] [address] [place], [place] [address]    Culture STAPHYLOCOCCUS AUREUS (A)  Final   Report Status 01/26/2017 FINAL  Final   Organism ID, Bacteria STAPHYLOCOCCUS AUREUS  Final      Susceptibility   Staphylococcus aureus - MIC*    CIPROFLOXACIN <=0.5 SENSITIVE  Sensitive     ERYTHROMYCIN <=0.25 SENSITIVE Sensitive     GENTAMICIN <=0.5 SENSITIVE Sensitive     OXACILLIN 0.5 SENSITIVE Sensitive     TETRACYCLINE <=1 SENSITIVE Sensitive     VANCOMYCIN <=0.5 SENSITIVE Sensitive     TRIMETH/SULFA <=10 SENSITIVE Sensitive     CLINDAMYCIN <=0.25 SENSITIVE Sensitive     RIFAMPIN <=0.5 SENSITIVE Sensitive     Inducible Clindamycin NEGATIVE Sensitive     * STAPHYLOCOCCUS AUREUS  Blood Culture ID Panel (Reflexed)     Status: Abnormal   Collection Time: 01/24/17  8:23 AM  Result Value Ref Range Status   Enterococcus species NOT DETECTED NOT DETECTED Final   Vancomycin resistance NOT DETECTED NOT DETECTED Final   Listeria monocytogenes NOT DETECTED NOT DETECTED Final   Staphylococcus species DETECTED (A) NOT DETECTED Final    Comment: CRITICAL RESULT CALLED TO, READ BACK BY AND VERIFIED WITH: TO  SMAYNARD(RN) BY TCLEVELAND 01/24/2017 AT 11:52PM    Staphylococcus aureus DETECTED (A) NOT DETECTED Final    Comment: CRITICAL RESULT CALLED TO, READ BACK BY AND VERIFIED WITH: TO  SMAYNARD(RN) BY TCLEVELAND 01/24/2017 AT 11:52PM    Methicillin resistance NOT DETECTED NOT DETECTED Final   Streptococcus species NOT DETECTED NOT DETECTED Final   Streptococcus agalactiae NOT DETECTED NOT DETECTED Final   Streptococcus pneumoniae NOT DETECTED NOT DETECTED Final   Streptococcus pyogenes NOT DETECTED NOT DETECTED Final   Acinetobacter baumannii NOT DETECTED NOT DETECTED Final   Enterobacteriaceae species NOT DETECTED NOT DETECTED Final   Enterobacter cloacae complex NOT DETECTED NOT DETECTED Final   Escherichia  coli NOT DETECTED NOT DETECTED Final   Klebsiella oxytoca NOT DETECTED NOT DETECTED Final   Klebsiella pneumoniae NOT DETECTED NOT DETECTED Final   Proteus species NOT DETECTED NOT DETECTED Final   Serratia marcescens NOT DETECTED NOT DETECTED Final   Carbapenem resistance NOT DETECTED NOT DETECTED Final   Haemophilus influenzae NOT DETECTED NOT DETECTED  Final   Neisseria meningitidis NOT DETECTED NOT DETECTED Final   Pseudomonas aeruginosa NOT DETECTED NOT DETECTED Final   Candida albicans NOT DETECTED NOT DETECTED Final   Candida glabrata NOT DETECTED NOT DETECTED Final   Candida krusei NOT DETECTED NOT DETECTED Final   Candida parapsilosis NOT DETECTED NOT DETECTED Final   Candida tropicalis NOT DETECTED NOT DETECTED Final    Comment: Performed at [facility], [place] [address] [place], [place] [address]  Culture, blood (routine x 2)     Status: Abnormal   Collection Time: 01/24/17  8:41 AM  Result Value Ref Range Status   Specimen Description BLOOD RIGHT HAND  Final   Special Requests IN PEDIATRIC BOTTLE Blood Culture adequate volume  Final   Culture  Setup Time   Final    IN PEDIATRIC BOTTLE GRAM POSITIVE COCCI IN CLUSTERS CRITICAL RESULT CALLED TO, READ BACK BY AND VERIFIED WITH: D FRANCIS,RN AT [place] 7893 01/25/17 BY L BENFIELD    Culture (A)  Final    STAPHYLOCOCCUS AUREUS SUSCEPTIBILITIES PERFORMED ON PREVIOUS CULTURE WITHIN THE LAST 5 DAYS.    Report Status 01/27/2017 FINAL  Final  MRSA PCR Screening     Status: None   Collection Time: 01/24/17 10:34 AM  Result Value Ref Range Status   MRSA by PCR NEGATIVE NEGATIVE Final    Comment:        The GeneXpert MRSA Assay (FDA approved for NASAL specimens only), is one component of a comprehensive MRSA colonization surveillance program. It is not intended to diagnose MRSA infection nor to guide or monitor treatment for MRSA infections.   Blood culture (routine x 2)     Status: Abnormal   Collection Time: 01/27/17  2:10 PM  Result Value Ref Range Status   Specimen Description BLOOD RIGHT ANTECUBITAL  Final   Special Requests   Final    BOTTLES DRAWN AEROBIC AND ANAEROBIC Blood Culture adequate volume   Culture  Setup Time   Final    GRAM POSITIVE COCCI IN BOTH AEROBIC AND ANAEROBIC BOTTLES CRITICAL RESULT CALLED TO, READ BACK BY AND VERIFIED WITH: PHARMD E  JACKSON [id number] MLM    Culture (A)  Final    STAPHYLOCOCCUS AUREUS SUSCEPTIBILITIES PERFORMED ON PREVIOUS CULTURE WITHIN THE LAST 5 DAYS. Performed at [facility], [place] [address] [place],  [address]    Report Status 01/30/2017 FINAL  Final  Blood culture (routine x 2)     Status: Abnormal (Preliminary result)   Collection Time: 01/27/17  2:32 PM  Result Value Ref Range Status   Specimen Description BLOOD LEFT HAND  Final   Special Requests   Final    BOTTLES DRAWN AEROBIC AND ANAEROBIC Blood Culture adequate volume   Culture  Setup Time   Final    GRAM POSITIVE COCCI IN BOTH AEROBIC AND ANAEROBIC BOTTLES CRITICAL RESULT CALLED TO, READ BACK BY AND VERIFIED WITH: Irwin Brakeman AT 8242 01/29/17 BY L BENFIELD    Culture (A)  Final    STAPHYLOCOCCUS AUREUS SUSCEPTIBILITIES PERFORMED ON PREVIOUS CULTURE WITHIN THE LAST 5 DAYS. STREPTOCOCCUS SPECIES IDENTIFICATION TO FOLLOW Performed at  [facility], [place] [address], [place], [place] [address]    Report Status PENDING  Incomplete  Culture, blood (single) w Reflex to ID Panel     Status: None (Preliminary result)   Collection Time: 01/28/17  8:56 AM  Result Value Ref Range Status   Specimen Description BLOOD RIGHT HAND  Final   Special Requests IN PEDIATRIC BOTTLE Blood Culture adequate volume  Final   Culture NO GROWTH 2 DAYS  Final   Report Status PENDING  Incomplete  Culture, blood (single) w Reflex to ID Panel     Status: None (Preliminary result)   Collection Time: 01/29/17  8:17 AM  Result Value Ref Range Status   Specimen Description BLOOD LEFT HAND  Final   Special Requests IN PEDIATRIC BOTTLE Blood Culture adequate volume  Final   Culture NO GROWTH < 24 HOURS  Final   Report Status PENDING  Incomplete      Radiology Studies: No results found.   Scheduled Meds: . [START ON 01/31/2017] buprenorphine-naloxone  2 tablet Sublingual Daily  . cloNIDine  0.1 mg Oral BH-qamhs   Followed by  .  [START ON 02/01/2017] cloNIDine  0.1 mg Oral QAC breakfast  . enoxaparin (LOVENOX) injection  30 mg Subcutaneous Q24H  . ferrous sulfate  325 mg Oral Q breakfast  . folic acid  1 mg Oral Daily  . LORazepam  0-4 mg Oral Q12H  . multivitamin with minerals  1 tablet Oral Daily  . nicotine  14 mg Transdermal Daily  . potassium chloride  40 mEq Oral Daily  . thiamine  100 mg Oral Daily  . [START ON 01/31/2017] venlafaxine XR  37.5 mg Oral Q breakfast   Continuous Infusions: . nafcillin IV Stopped (01/30/17 1624)     LOS: 3 days    Time spent: 30 min    Janece Canterbury, MD [facility] Pager [phone]  If 7PM-7AM, please contact night-coverage [email or web address] Password [facility] 01/30/2017, 5:55 PM

## 2017-01-30 NOTE — Consult Note (Signed)
[place] [facility] Consult   Reason for Consult:  Extreme anxiety, history of depression Referring Physician:  Dr. 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 Opioid type dependence, continuous ([place]) [F11.20] 01/30/2017    Priority: High  . 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 Substance induced mood disorder ([place]) [F19.94] 01/30/2017  . Bacteremia due to Staphylococcus aureus [R78.81] 01/27/2017  . Pneumonia [J18.9] 01/24/2017  . Pelvic abscess in female [N73.9] 01/13/2016  . PID (acute pelvic inflammatory disease) [N73.0] 01/13/2016    Total Time spent with patient: 45 minutes  Subjective:   Sherri Francis is a 31 y.o. female patient admitted with cough and fever  HPI:  Thanks for asking me to do a psychiatric consultation on Sherri Francis, a 31 y.o. female who reports Heroin dependence via  IV,  hepatitis C, Anxiety, Depression but has not been taking her prescribed medications for 2 years. Instead, patient has been self medicating with Heroin, Alcohol, Cocaine and Amphetamine. Patient reports that her drug of choice is Heroin and last use 2 days before this current admission. She is currently endorsing anxiety, nervousness and mild  Depressive symptoms. She reports history of using Xanax but her urine toxicology is only positive for Heroin. Patient reports heroin withdrawal symptoms such as muscle spasm, nausea, vomiting, and diarrhea. She denies psychosis, delusions, suicide or homicidal thoughts.  Past Psychiatric History:as above  Risk to Self: Is patient at risk for suicide?: No Risk to Others:   Prior Inpatient Therapy:   Prior Outpatient  Therapy:    Past Medical History:  Past Medical History:  Diagnosis Date  . Anemia   . Anxiety   . Depression   . Hepatitis C   . IVDU (intravenous drug user)   . Suicide attempt [facility])    multiple attempts  . UTI (urinary tract infection)     Past Surgical History:  Procedure Laterality Date  . LAPAROSCOPY     Family History:  Family History  Problem Relation Age of Onset  . Heart disease Maternal Grandfather   . Anxiety disorder Maternal Grandfather   . Heart disease Maternal Aunt   . Alcohol abuse Father   . Drug abuse Father   . Cancer Neg Hx    Family Psychiatric  History:  Social History:  History  Alcohol Use  . Yes    Comment: 6 beers perday- 2 years ago was last time hx of alcoholism per pt.     History  Drug Use  . Types: IV    Comment: heroin, last 2 days prior to admission    Social History   Social History  . Marital status: Single    Spouse name: N/A  . Number of children: N/A  . Years of education: N/A   Social History Main Topics  . Smoking status: Current Every Day Smoker    Packs/day: 0.50    Types: Cigarettes  . Smokeless tobacco: Never Used  . Alcohol use Yes     Comment: 6 beers perday- 2 years ago was last time hx of alcoholism per pt.  . Drug use: Yes    Types: IV     Comment: heroin, last 2 days prior to admission  . Sexual  activity: Not Currently   Other Topics Concern  . None   Social History Narrative  . None   Additional Social History:    Allergies:  No Known Allergies  Labs:  Results for orders placed or performed during the hospital encounter of 01/27/17 (from the past 48 hour(s))  HIV antibody     Status: None   Collection Time: 01/28/17  2:45 PM  Result Value Ref Range   HIV Screen 4th Generation wRfx Non Reactive Non Reactive    Comment: (NOTE) Performed At: [facility] [place], [place] [id number] Lindon Romp MD WU:[id number]   HIV antibody     Status: None   Collection  Time: 01/29/17  6:11 AM  Result Value Ref Range   HIV Screen 4th Generation wRfx Non Reactive Non Reactive    Comment: (NOTE) Performed At: [facility] [place], [place] [id number] Lindon Romp MD VH:[id number]   Basic metabolic panel     Status: Abnormal   Collection Time: 01/29/17  6:18 AM  Result Value Ref Range   Sodium 135 135 - 145 mmol/L   Potassium 3.2 (L) 3.5 - 5.1 mmol/L   Chloride 104 101 - 111 mmol/L   CO2 25 22 - 32 mmol/L   Glucose, Bld 112 (H) 65 - 99 mg/dL   BUN 8 6 - 20 mg/dL   Creatinine, Ser 0.43 (L) 0.44 - 1.00 mg/dL   Calcium 7.6 (L) 8.9 - 10.3 mg/dL   GFR calc non Af Amer >60 >60 mL/min   GFR calc Af Amer >60 >60 mL/min    Comment: (NOTE) The eGFR has been calculated using the CKD EPI equation. This calculation has not been validated in all clinical situations. eGFR's persistently <60 mL/min signify possible Chronic Kidney Disease.    Anion gap 6 5 - 15  CBC     Status: Abnormal   Collection Time: 01/29/17  6:18 AM  Result Value Ref Range   WBC 11.5 (H) 4.0 - 10.5 K/uL   RBC 3.62 (L) 3.87 - 5.11 MIL/uL   Hemoglobin 9.7 (L) 12.0 - 15.0 g/dL   HCT 29.4 (L) 36.0 - 46.0 %   MCV 81.2 78.0 - 100.0 fL   MCH 26.8 26.0 - 34.0 pg   MCHC 33.0 30.0 - 36.0 g/dL   RDW 15.3 11.5 - 15.5 %   Platelets 463 (H) 150 - 400 K/uL  Magnesium     Status: None   Collection Time: 01/29/17  6:18 AM  Result Value Ref Range   Magnesium 2.0 1.7 - 2.4 mg/dL  Culture, blood (single) w Reflex to ID Panel     Status: None (Preliminary result)   Collection Time: 01/29/17  8:17 AM  Result Value Ref Range   Specimen Description BLOOD LEFT HAND    Special Requests IN PEDIATRIC BOTTLE Blood Culture adequate volume    Culture NO GROWTH < 24 HOURS    Report Status PENDING   Basic metabolic panel     Status: Abnormal   Collection Time: 01/30/17  8:50 AM  Result Value Ref Range   Sodium 134 (L) 135 - 145 mmol/L   Potassium 4.0 3.5 - 5.1 mmol/L    Comment:  DELTA CHECK NOTED   Chloride 104 101 - 111 mmol/L   CO2 23 22 - 32 mmol/L   Glucose, Bld 104 (H) 65 - 99 mg/dL   BUN 8 6 - 20 mg/dL   Creatinine, Ser 0.46 0.44 - 1.00  mg/dL   Calcium 7.8 (L) 8.9 - 10.3 mg/dL   GFR calc non Af Amer >60 >60 mL/min   GFR calc Af Amer >60 >60 mL/min    Comment: (NOTE) The eGFR has been calculated using the CKD EPI equation. This calculation has not been validated in all clinical situations. eGFR's persistently <60 mL/min signify possible Chronic Kidney Disease.    Anion gap 7 5 - 15  CBC     Status: Abnormal   Collection Time: 01/30/17  8:50 AM  Result Value Ref Range   WBC 11.7 (H) 4.0 - 10.5 K/uL   RBC 3.76 (L) 3.87 - 5.11 MIL/uL   Hemoglobin 10.0 (L) 12.0 - 15.0 g/dL   HCT 01.5 (L) 99.1 - 48.4 %   MCV 81.9 78.0 - 100.0 fL   MCH 26.6 26.0 - 34.0 pg   MCHC 32.5 30.0 - 36.0 g/dL   RDW 80.3 21.6 - 16.3 %   Platelets 543 (H) 150 - 400 K/uL  Magnesium     Status: None   Collection Time: 01/30/17  8:50 AM  Result Value Ref Range   Magnesium 1.8 1.7 - 2.4 mg/dL    Current Facility-Administered Medications  Medication Dose Route Frequency Provider Last Rate Last Dose  . acetaminophen (TYLENOL) tablet 650 mg  650 mg Oral Q6H PRN Short, Thea Silversmith, MD       Or  . acetaminophen (TYLENOL) suppository 650 mg  650 mg Rectal Q6H PRN Short, Mackenzie, MD      . buprenorphine-naloxone (SUBOXONE) 8-2 mg per SL tablet 2 tablet  2 tablet Sublingual BID Short, Mackenzie, MD      . cloNIDine (CATAPRES) tablet 0.1 mg  0.1 mg Oral Ivonne Andrew, MD   0.1 mg at 01/30/17 0944   Followed by  . [START ON 02/01/2017] cloNIDine (CATAPRES) tablet 0.1 mg  0.1 mg Oral QAC breakfast Short, Mackenzie, MD      . dicyclomine (BENTYL) tablet 20 mg  20 mg Oral Q6H PRN Renae Fickle, MD   20 mg at 01/28/17 1653  . enoxaparin (LOVENOX) injection 30 mg  30 mg Subcutaneous Q24H Renae Fickle, MD   30 mg at 01/29/17 2059  . ferrous sulfate tablet 325 mg  325 mg Oral Q  breakfast Renae Fickle, MD   325 mg at 01/30/17 2828  . folic acid (FOLVITE) tablet 1 mg  1 mg Oral Daily Short, Mackenzie, MD   1 mg at 01/30/17 9610  . hydrOXYzine (ATARAX/VISTARIL) tablet 25 mg  25 mg Oral Q6H PRN Short, Thea Silversmith, MD      . loperamide (IMODIUM) capsule 2-4 mg  2-4 mg Oral PRN Renae Fickle, MD   4 mg at 01/28/17 1254  . LORazepam (ATIVAN) tablet 1 mg  1 mg Oral Q6H PRN Renae Fickle, MD   1 mg at 01/30/17 3511   Or  . LORazepam (ATIVAN) injection 1 mg  1 mg Intravenous Q6H PRN Short, Mackenzie, MD      . LORazepam (ATIVAN) tablet 0-4 mg  0-4 mg Oral Beverley Fiedler, MD   1 mg at 01/29/17 1811  . magnesium sulfate IVPB 2 g 50 mL  2 g Intravenous Once Renae Fickle, MD 50 mL/hr at 01/30/17 1318 2 g at 01/30/17 1318  . methocarbamol (ROBAXIN) tablet 500 mg  500 mg Oral Q8H PRN Short, Mackenzie, MD      . multivitamin with minerals tablet 1 tablet  1 tablet Oral Daily Short, Mackenzie, MD   1 tablet at  01/30/17 6301  . nafcillin 2 g in dextrose 5 % 100 mL IVPB  2 g Intravenous Q4H Carlyle Basques, MD 200 mL/hr at 01/30/17 1230 2 g at 01/30/17 1230  . naproxen (NAPROSYN) tablet 500 mg  500 mg Oral BID PRN Janece Canterbury, MD   500 mg at 01/29/17 2335  . nicotine (NICODERM CQ - dosed in mg/24 hours) patch 14 mg  14 mg Transdermal Daily Janece Canterbury, MD   14 mg at 01/30/17 0944  . ondansetron (ZOFRAN-ODT) disintegrating tablet 4 mg  4 mg Oral Q6H PRN Janece Canterbury, MD   4 mg at 01/29/17 0926  . potassium chloride SA (K-DUR,KLOR-CON) CR tablet 40 mEq  40 mEq Oral Daily Janece Canterbury, MD   40 mEq at 01/30/17 6010  . thiamine (VITAMIN B-1) tablet 100 mg  100 mg Oral Daily Short, Mackenzie, MD   100 mg at 01/30/17 9323    Musculoskeletal: Strength & Muscle Tone: within normal limits Gait & Station: normal Patient leans: N/A  Psychiatric Specialty Exam: Physical Exam  Psychiatric: Her speech is normal. Judgment and thought content normal. She is  agitated. Cognition and memory are normal. She exhibits a depressed mood.    Review of Systems  Constitutional: Positive for malaise/fatigue.  HENT: Negative.   Eyes: Negative.   Respiratory: Positive for cough.   Gastrointestinal: Negative.   Genitourinary: Negative.   Musculoskeletal: Negative.   Skin: Negative.   Neurological: Negative.   Endo/Heme/Allergies: Negative.   Psychiatric/Behavioral: Positive for depression and substance abuse. The patient is nervous/anxious.     Blood pressure 130/68, pulse 100, temperature 98.5 F (36.9 C), temperature source Oral, resp. rate 16, height '5\' 1"'$  (1.549 m), weight 50.9 kg (112 lb 3.4 oz), SpO2 100 %.Body mass index is 21.2 kg/m.  General Appearance: Casual  Eye Contact:  Minimal  Speech:  Clear and Coherent  Volume:  Decreased  Mood:  Anxious and Irritable  Affect:  Labile  Thought Process:  Coherent and Descriptions of Associations: Intact  Orientation:  Full (Time, Place, and Person)  Thought Content:  Logical  Suicidal Thoughts:  No  Homicidal Thoughts:  No  Memory:  Immediate;   Fair Recent;   Fair Remote;   Fair  Judgement:  Other:  marginal  Insight:  Shallow  Psychomotor Activity:  Restlessness  Concentration:  Concentration: Fair and Attention Span: Fair  Recall:  Good  Fund of Knowledge:  Good  Language:  Good  Akathisia:  No  Handed:  Right  AIMS (if indicated):     Assets:  Communication Skills  ADL's:  Intact  Cognition:  WNL  Sleep:   fair     Treatment Plan Summary: Patient with history Opioid dependence, Depression, Anxiety but not compliant with her medications. She has been self medicating with Heroin. Patient is seeking for Xanax but her urine toxicology is only positive for Heroin not Benzodiazepine. Patient reports increased anxiety which may be due to opioid withdrawal.  Plan/Recomendations:  -Continue Opioid withdrawal protocol. -Wean patient off Ativan protocol- U-tox negative for  Benzodiazepine, patient with addiction problem who appears to be Benzodiazepine seeking. Also patient cannot take Benzodiazepine and Suboxone at the same time. -Recommends Effexor XR 37.5 mg daily for anxiety/depression-patient states it helped her in the past. -Recommends Gabapentin 100 mg tid for anxiety/withdrawal symptoms.  Disposition: No evidence of imminent risk to self or others at present.   Patient does not meet criteria for psychiatric inpatient admission. Supportive therapy provided about ongoing stressors. Social worker  to assist with referring patient to drug rehab center upon discharge  Corena Pilgrim, MD 01/30/2017 1:32 PM

## 2017-01-30 NOTE — Progress Notes (Addendum)
Patient ID: Sherri Francis, female   DOB: 1986/04/14, 31 y.o.   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 Blood cultures done on 8/23 and 8/24 remain negative. 2 more sets of blood cultures were repeated today. Although it would be safe to place a PICC if blood cultures remain negative tomorrow I would want to know that she is committed to receiving a full course of IV antibiotics, will go to a SNF, and will be accepted by a SNF before having one placed. I will continue nafcillin for now.         Michel Bickers, MD [facility] [phone] pager   [phone] cell 01/30/2017, 12:17 PM

## 2017-01-31 ENCOUNTER — Inpatient Hospital Stay (HOSPITAL_COMMUNITY): Payer: Self-pay

## 2017-01-31 DIAGNOSIS — R1011 Right upper quadrant pain: Secondary | ICD-10-CM

## 2017-01-31 LAB — CULTURE, BLOOD (ROUTINE X 2): Special Requests: ADEQUATE

## 2017-01-31 LAB — HEPATITIS C GENOTYPE

## 2017-01-31 LAB — HCV RNA QUANT RFLX ULTRA OR GENOTYP
HCV RNA Qnt(log copy/mL): 5.748 log10 IU/mL
HepC Qn: 560000 IU/mL

## 2017-01-31 MED ORDER — BUPRENORPHINE HCL-NALOXONE HCL 8-2 MG SL SUBL
2.0000 | SUBLINGUAL_TABLET | Freq: Two times a day (BID) | SUBLINGUAL | Status: DC
  Administered 2017-01-31 – 2017-02-05 (×9): 2 via SUBLINGUAL
  Filled 2017-01-31 (×9): qty 2

## 2017-01-31 MED ORDER — GABAPENTIN 100 MG PO CAPS
100.0000 mg | ORAL_CAPSULE | Freq: Three times a day (TID) | ORAL | Status: DC | PRN
  Administered 2017-02-11: 100 mg via ORAL
  Filled 2017-01-31: qty 1

## 2017-01-31 MED ORDER — FLUOXETINE HCL 10 MG PO CAPS
10.0000 mg | ORAL_CAPSULE | Freq: Every day | ORAL | Status: DC
  Administered 2017-02-01 – 2017-02-10 (×9): 10 mg via ORAL
  Filled 2017-01-31 (×13): qty 1

## 2017-01-31 MED ORDER — ENOXAPARIN SODIUM 40 MG/0.4ML ~~LOC~~ SOLN
40.0000 mg | SUBCUTANEOUS | Status: DC
  Administered 2017-01-31 – 2017-02-05 (×6): 40 mg via SUBCUTANEOUS
  Filled 2017-01-31 (×10): qty 0.4

## 2017-01-31 NOTE — Progress Notes (Signed)
PROGRESS NOTE  Sherri Francis  BOF:[id number] DOB: 10-14-1985 DOA: 01/27/2017 PCP: Patient, No Pcp Per  Brief Narrative:   Sherri Francis is a 31 y.o. female with history of IVDA, hepatitis C, alcohol use, tobacco abuse, depression/anxiety with suicide attempts who presented to the ER on 8/19 with about a week of fever and cough.  She was found to have bilateral pneumonia and was given vancomycin and ceftriaxone.  She was going to be admitted to the hospital, but she left against medical advice because her withdrawal symptoms were severe.  She was called back on the same date because 2/2 blood cultures were positive for MSSA and advised to come back to the hospital.  She returned to the ER on 8/22.  She has septic pulmonary embolic with a 1.5 x 7.7OE tricuspid valve vegetation with mild regurgitation.    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 Evette Doffing:   -  Day 4: increased to maximum dose of 32 mg daily -  Clonidine detox almost complete -  SW consult -  UDS positive for opiates -  HIV NR and hep C log10 5.748/ 560,000 IU/mL, genotype 1a  Reported distant ETOH use -  ETOH level negative -  CIWA completed -  Thiamine, folate, MVI  Hypokalemia -  Oral potassium repletion  Iron deficiency vs. Anemia of chronic disease -  D/c iron  supplementation due to nausea and vomiting  Hepatitis C  -  LFTs wnl  Hypokalemia, mild, resolved.  Depression/anxiety -  Start prozac -  D/c effexor  DVT prophylaxis: lovenox  Code Status: full code Family Communication:  Patient and her mother   Disposition Plan: will likely need SNF at discharge for long course of IV antibiotics.  Patient has no insurance and is an IV drug user and is currently on suboxone.  Not sure if we will be able to find a SNF that will take her.  May need to transition to oxycontin for SNF.    Consultants:   ID  Psychiatry  Procedures:  CT chest ECHO  Antimicrobials:  Anti-infectives    Start     Dose/Rate Route Frequency Ordered Stop   01/28/17 1600  nafcillin 2 g in dextrose 5 % 100 mL IVPB     2 g 200 mL/hr over 30 Minutes Intravenous Every 4 hours 01/28/17 1445     01/28/17 1445  nafcillin injection 2 g  Status:  Discontinued     2 g Intravenous Every 4 hours 01/28/17 1435 01/28/17 1444   01/27/17 2200  ceFAZolin (ANCEF) IVPB 2g/100 mL premix  Status:  Discontinued     2 g 200 mL/hr over 30 Minutes Intravenous Every 8 hours 01/27/17 1702 01/28/17 1435   01/27/17 1400  vancomycin (VANCOCIN) IVPB 1000 mg/200 mL premix  1,000 mg 200 mL/hr over 60 Minutes Intravenous  Once 01/27/17 1348 01/27/17 1606       Subjective:  Having sweats and fevers.  Having ongoing nausea and diarrhea.  Does not want to be on effexor.        Objective: Vitals:   01/30/17 1315 01/30/17 2139 01/31/17 0510 01/31/17 1325  BP: 130/68 (!) 107/52 117/65 (!) 110/54  Pulse: 100 60 (!) 105 91  Resp: 16 16 16 16   Temp: 98.5 F (36.9 C) 97.7 F (36.5 C) 100 F (37.8 C) 98.4 F (36.9 C)  TempSrc: Oral Oral Oral Oral  SpO2: 100% 90% 98% 99%  Weight: 50.9 kg (112 lb 3.4 oz)   47.7 kg (105 lb 2.6 oz)  Height:        Intake/Output Summary (Last 24 hours) at 01/31/17 1728 Last data filed at 01/31/17 0747  Gross per 24 hour  Intake              240 ml  Output                 2 ml  Net              238 ml   Filed Weights   01/28/17 2026 01/30/17 1315 01/31/17 1325  Weight: 45.3 kg (99 lb 13.9 oz) 50.9 kg (112 lb 3.4 oz) 47.7 kg (105 lb 2.6 oz)    Examination:  General exam:  Adult female.  No acute distress.  HEENT:  NCAT, MMM Respiratory system: Clear to auscultation bilaterally Cardiovascular system: Regular rate and rhythm, normal S1/S2. No murmurs, rubs, gallops or clicks.  Warm extremities Gastrointestinal system: Normal active bowel sounds, soft, nondistended, TTP in the RUQ without rebound or guarding MSK:  Normal tone and bulk, no lower extremity edema Neuro:  Grossly intact Derm:  Track marks on bilateral arms.  Bruising is improving.     Data Reviewed: I have personally reviewed following labs and imaging studies  CBC:  Recent Labs Lab 01/27/17 1310 01/28/17 0856 01/29/17 0618 01/30/17 0850  WBC 14.4* 15.6* 11.5* 11.7*  NEUTROABS 10.8*  --   --   --   HGB 9.4* 9.5* 9.7* 10.0*  HCT 28.4* 28.7* 29.4* 30.8*  MCV 80.2 81.5 81.2 81.9  PLT 380 500* 463* 295*   Basic Metabolic Panel:  Recent Labs Lab 01/27/17 1310 01/28/17 0856 01/29/17 0618 01/30/17 0850  NA 138 136 135 134*  K 3.0* 3.4* 3.2* 4.0  CL 103 106 104 104  CO2 27 24 25 23   GLUCOSE 137* 114* 112* 104*  BUN 10 7 8 8   CREATININE 0.38* 0.45 0.43* 0.46  CALCIUM 7.4* 7.7* 7.6* 7.8*  MG  --  2.0 2.0 1.8   GFR: Estimated Creatinine Clearance: 78.1 mL/min (by C-G formula based on SCr of 0.46 mg/dL). Liver Function Tests:  Recent Labs Lab 01/27/17 1310  AST 26  ALT 20  ALKPHOS 63  BILITOT 0.1*  PROT 6.6  ALBUMIN 2.4*    Recent Labs Lab 01/27/17 1310  LIPASE 24   No results for input(s): AMMONIA in the last 168 hours. Coagulation Profile: No results for input(s): INR, PROTIME in the last 168 hours. Cardiac Enzymes: No results for input(s): CKTOTAL, CKMB, CKMBINDEX, TROPONINI in the last 168 hours. BNP (last 3 results) No results for  input(s): PROBNP in the last 8760 hours. HbA1C: No results for input(s): HGBA1C in the last 72 hours. CBG: No results for input(s): GLUCAP in the last 168 hours. Lipid  Profile: No results for input(s): CHOL, HDL, LDLCALC, TRIG, CHOLHDL, LDLDIRECT in the last 72 hours. Thyroid Function Tests: No results for input(s): TSH, T4TOTAL, FREET4, T3FREE, THYROIDAB in the last 72 hours. Anemia Panel: No results for input(s): VITAMINB12, FOLATE, FERRITIN, TIBC, IRON, RETICCTPCT in the last 72 hours. Urine analysis:    Component Value Date/Time   COLORURINE STRAW (A) 01/27/2017 1516   APPEARANCEUR CLEAR 01/27/2017 1516   LABSPEC 1.004 (L) 01/27/2017 1516   PHURINE 7.0 01/27/2017 1516   GLUCOSEU NEGATIVE 01/27/2017 1516   HGBUR SMALL (A) 01/27/2017 1516   BILIRUBINUR NEGATIVE 01/27/2017 1516   KETONESUR NEGATIVE 01/27/2017 1516   PROTEINUR NEGATIVE 01/27/2017 1516   UROBILINOGEN 0.2 10/15/2012 1341   NITRITE NEGATIVE 01/27/2017 1516   LEUKOCYTESUR SMALL (A) 01/27/2017 1516   Sepsis Labs: @LABRCNTIP (procalcitonin:4,lacticidven:4)  ) Recent Results (from the past 240 hour(s))  Culture, blood (routine x 2)     Status: Abnormal   Collection Time: 01/24/17  8:23 AM  Result Value Ref Range Status   Specimen Description BLOOD LEFT FOREARM  Final   Special Requests   Final    BOTTLES DRAWN AEROBIC AND ANAEROBIC Blood Culture adequate volume   Culture  Setup Time   Final    IN BOTH AEROBIC AND ANAEROBIC BOTTLES GRAM POSITIVE COCCI IN CLUSTERS CRITICAL RESULT CALLED TO, READ BACK BY AND VERIFIED WITH: TO SMAYNARD(RN) BY TCLEVELAND 01/24/2017 AT 11:52PM  CRITICAL RESULT CALLED TO, READ BACK BY AND VERIFIED WITH: Hillary Bow AT [place] 1696 01/25/17 BY L BENFIELD Performed at [facility], [place] [address] [place], [place] [address]    Culture STAPHYLOCOCCUS AUREUS (A)  Final   Report Status 01/26/2017 FINAL  Final   Organism ID, Bacteria STAPHYLOCOCCUS AUREUS  Final       Susceptibility   Staphylococcus aureus - MIC*    CIPROFLOXACIN <=0.5 SENSITIVE Sensitive     ERYTHROMYCIN <=0.25 SENSITIVE Sensitive     GENTAMICIN <=0.5 SENSITIVE Sensitive     OXACILLIN 0.5 SENSITIVE Sensitive     TETRACYCLINE <=1 SENSITIVE Sensitive     VANCOMYCIN <=0.5 SENSITIVE Sensitive     TRIMETH/SULFA <=10 SENSITIVE Sensitive     CLINDAMYCIN <=0.25 SENSITIVE Sensitive     RIFAMPIN <=0.5 SENSITIVE Sensitive     Inducible Clindamycin NEGATIVE Sensitive     * STAPHYLOCOCCUS AUREUS  Blood Culture ID Panel (Reflexed)     Status: Abnormal   Collection Time: 01/24/17  8:23 AM  Result Value Ref Range Status   Enterococcus species NOT DETECTED NOT DETECTED Final   Vancomycin resistance NOT DETECTED NOT DETECTED Final   Listeria monocytogenes NOT DETECTED NOT DETECTED Final   Staphylococcus species DETECTED (A) NOT DETECTED Final    Comment: CRITICAL RESULT CALLED TO, READ BACK BY AND VERIFIED WITH: TO  SMAYNARD(RN) BY TCLEVELAND 01/24/2017 AT 11:52PM    Staphylococcus aureus DETECTED (A) NOT DETECTED Final    Comment: CRITICAL RESULT CALLED TO, READ BACK BY AND VERIFIED WITH: TO  SMAYNARD(RN) BY TCLEVELAND 01/24/2017 AT 11:52PM    Methicillin resistance NOT DETECTED NOT DETECTED Final   Streptococcus species NOT DETECTED NOT DETECTED Final   Streptococcus agalactiae NOT DETECTED NOT DETECTED Final   Streptococcus pneumoniae NOT DETECTED NOT DETECTED Final   Streptococcus pyogenes NOT DETECTED NOT DETECTED Final   Acinetobacter baumannii NOT DETECTED NOT DETECTED Final   Enterobacteriaceae species NOT DETECTED NOT DETECTED Final   Enterobacter cloacae complex NOT DETECTED NOT DETECTED Final   Escherichia coli NOT DETECTED NOT DETECTED Final  Klebsiella oxytoca NOT DETECTED NOT DETECTED Final   Klebsiella pneumoniae NOT DETECTED NOT DETECTED Final   Proteus species NOT DETECTED NOT DETECTED Final   Serratia marcescens NOT DETECTED NOT DETECTED Final   Carbapenem resistance NOT  DETECTED NOT DETECTED Final   Haemophilus influenzae NOT DETECTED NOT DETECTED Final   Neisseria meningitidis NOT DETECTED NOT DETECTED Final   Pseudomonas aeruginosa NOT DETECTED NOT DETECTED Final   Candida albicans NOT DETECTED NOT DETECTED Final   Candida glabrata NOT DETECTED NOT DETECTED Final   Candida krusei NOT DETECTED NOT DETECTED Final   Candida parapsilosis NOT DETECTED NOT DETECTED Final   Candida tropicalis NOT DETECTED NOT DETECTED Final    Comment: Performed at [facility], [place] [address] [place], [place] [address]  Culture, blood (routine x 2)     Status: Abnormal   Collection Time: 01/24/17  8:41 AM  Result Value Ref Range Status   Specimen Description BLOOD RIGHT HAND  Final   Special Requests IN PEDIATRIC BOTTLE Blood Culture adequate volume  Final   Culture  Setup Time   Final    IN PEDIATRIC BOTTLE GRAM POSITIVE COCCI IN CLUSTERS CRITICAL RESULT CALLED TO, READ BACK BY AND VERIFIED WITH: D FRANCIS,RN AT [place] 8469 01/25/17 BY L BENFIELD    Culture (A)  Final    STAPHYLOCOCCUS AUREUS SUSCEPTIBILITIES PERFORMED ON PREVIOUS CULTURE WITHIN THE LAST 5 DAYS.    Report Status 01/27/2017 FINAL  Final  MRSA PCR Screening     Status: None   Collection Time: 01/24/17 10:34 AM  Result Value Ref Range Status   MRSA by PCR NEGATIVE NEGATIVE Final    Comment:        The GeneXpert MRSA Assay (FDA approved for NASAL specimens only), is one component of a comprehensive MRSA colonization surveillance program. It is not intended to diagnose MRSA infection nor to guide or monitor treatment for MRSA infections.   Blood culture (routine x 2)     Status: Abnormal   Collection Time: 01/27/17  2:10 PM  Result Value Ref Range Status   Specimen Description BLOOD RIGHT ANTECUBITAL  Final   Special Requests   Final    BOTTLES DRAWN AEROBIC AND ANAEROBIC Blood Culture adequate volume   Culture  Setup Time   Final    GRAM POSITIVE COCCI IN BOTH AEROBIC AND  ANAEROBIC BOTTLES CRITICAL RESULT CALLED TO, READ BACK BY AND VERIFIED WITH: PHARMD E JACKSON [id number] [phone]    Culture (A)  Final    STAPHYLOCOCCUS AUREUS SUSCEPTIBILITIES PERFORMED ON PREVIOUS CULTURE WITHIN THE LAST 5 DAYS. Performed at [facility], [place] [address], [place], [place] [address]    Report Status 01/30/2017 FINAL  Final  Blood culture (routine x 2)     Status: Abnormal   Collection Time: 01/27/17  2:32 PM  Result Value Ref Range Status   Specimen Description BLOOD LEFT HAND  Final   Special Requests   Final    BOTTLES DRAWN AEROBIC AND ANAEROBIC Blood Culture adequate volume   Culture  Setup Time   Final    GRAM POSITIVE COCCI IN BOTH AEROBIC AND ANAEROBIC BOTTLES CRITICAL RESULT CALLED TO, READ BACK BY AND VERIFIED WITH: Irwin Brakeman AT 2725 01/29/17 BY L BENFIELD    Culture (A)  Final    STAPHYLOCOCCUS AUREUS SUSCEPTIBILITIES PERFORMED ON PREVIOUS CULTURE WITHIN THE LAST 5 DAYS. VIRIDANS STREPTOCOCCUS THE SIGNIFICANCE OF ISOLATING THIS ORGANISM FROM A SINGLE SET OF BLOOD CULTURES WHEN MULTIPLE  SETS ARE DRAWN IS UNCERTAIN. PLEASE NOTIFY THE MICROBIOLOGY DEPARTMENT WITHIN ONE WEEK IF SPECIATION AND SENSITIVITIES ARE REQUIRED. Performed at [facility], [place] [address] [place], [place] [address]    Report Status 01/31/2017 FINAL  Final  Culture, blood (single) w Reflex to ID Panel     Status: None (Preliminary result)   Collection Time: 01/28/17  8:56 AM  Result Value Ref Range Status   Specimen Description BLOOD RIGHT HAND  Final   Special Requests IN PEDIATRIC BOTTLE Blood Culture adequate volume  Final   Culture   Final    NO GROWTH 3 DAYS Performed at [facility], [place] [address] [place], [place] [address]    Report Status PENDING  Incomplete  Culture, blood (single) w Reflex to ID Panel     Status: None (Preliminary result)   Collection Time: 01/29/17  8:17 AM  Result Value Ref Range Status   Specimen Description BLOOD LEFT HAND   Final   Special Requests IN PEDIATRIC BOTTLE Blood Culture adequate volume  Final   Culture   Final    NO GROWTH 2 DAYS Performed at [facility], [place] [address] [place], [place] [address]    Report Status PENDING  Incomplete  Culture, blood (single) w Reflex to ID Panel     Status: None (Preliminary result)   Collection Time: 01/30/17  8:50 AM  Result Value Ref Range Status   Specimen Description BLOOD BLOOD LEFT HAND  Final   Special Requests IN PEDIATRIC BOTTLE Blood Culture adequate volume  Final   Culture   Final    NO GROWTH < 24 HOURS Performed at [facility], [place] [address], [place],  [address]    Report Status PENDING  Incomplete      Radiology Studies: US Abdomen Limited Ruq  Result Date: 01/31/2017 CLINICAL DATA:  RIGHT upper quadrant pain with vomiting. EXAM: ULTRASOUND ABDOMEN LIMITED RIGHT UPPER QUADRANT COMPARISON:  01/27/2017. FINDINGS: Gallbladder: No gallstones or wall thickening visualized. No sonographic Murphy sign noted by sonographer. Common bile duct: Diameter: 4 mm, within normal limits. Liver: No focal lesion identified. Within normal limits in parenchymal echogenicity. Portal vein is patent on color Doppler imaging with normal direction of blood flow towards the liver. Compared with prior, no significant change. IMPRESSION: Negative RIGHT upper quadrant ultrasound. No cause seen for the reported symptoms. Electronically Signed   By: Staci Righter M.D.   On: 01/31/2017 14:20     Scheduled Meds: . buprenorphine-naloxone  2 tablet Sublingual BID  . cloNIDine  0.1 mg Oral BH-qamhs   Followed by  . [START ON 02/01/2017] cloNIDine  0.1 mg Oral QAC breakfast  . enoxaparin (LOVENOX) injection  40 mg Subcutaneous Q24H  . FLUoxetine  10 mg Oral Daily  . nicotine  14 mg Transdermal Daily  . potassium chloride  40 mEq Oral Daily   Continuous Infusions: . nafcillin IV Stopped (01/31/17 1648)     LOS: 4 days    Time spent: 30  min    Janece Canterbury, MD [facility] Pager [phone]  If 7PM-7AM, please contact night-coverage [email or web address] Password [facility] 01/31/2017, 5:28 PM

## 2017-02-01 LAB — HCV RNA QUANT RFLX ULTRA OR GENOTYP
HCV RNA QNT(LOG COPY/ML): 6.121 {Log_IU}/mL
HepC Qn: 1320000 IU/mL

## 2017-02-01 LAB — HEPATITIS C GENOTYPE

## 2017-02-01 MED ORDER — POTASSIUM CHLORIDE CRYS ER 20 MEQ PO TBCR
40.0000 meq | EXTENDED_RELEASE_TABLET | Freq: Every day | ORAL | Status: AC
  Administered 2017-02-01 – 2017-02-03 (×3): 40 meq via ORAL
  Filled 2017-02-01 (×3): qty 2

## 2017-02-01 MED ORDER — METHOCARBAMOL 500 MG PO TABS
500.0000 mg | ORAL_TABLET | Freq: Three times a day (TID) | ORAL | Status: AC | PRN
  Filled 2017-02-01: qty 1

## 2017-02-01 MED ORDER — GUAIFENESIN-DM 100-10 MG/5ML PO SYRP
5.0000 mL | ORAL_SOLUTION | ORAL | Status: DC | PRN
  Administered 2017-02-01: 5 mL via ORAL
  Filled 2017-02-01 (×2): qty 10

## 2017-02-01 MED ORDER — ONDANSETRON 4 MG PO TBDP
4.0000 mg | ORAL_TABLET | Freq: Four times a day (QID) | ORAL | Status: AC | PRN
  Administered 2017-02-02: 4 mg via ORAL
  Filled 2017-02-01 (×2): qty 1

## 2017-02-01 MED ORDER — DICYCLOMINE HCL 20 MG PO TABS
20.0000 mg | ORAL_TABLET | Freq: Four times a day (QID) | ORAL | Status: AC | PRN
  Administered 2017-02-03: 20 mg via ORAL
  Filled 2017-02-01: qty 1

## 2017-02-01 MED ORDER — NAPROXEN 250 MG PO TABS
500.0000 mg | ORAL_TABLET | Freq: Two times a day (BID) | ORAL | Status: AC | PRN
  Administered 2017-02-02: 500 mg via ORAL
  Filled 2017-02-01: qty 2

## 2017-02-01 MED ORDER — LOPERAMIDE HCL 2 MG PO CAPS: 2.0000 mg | ORAL_CAPSULE | ORAL | Status: AC | PRN

## 2017-02-01 MED ORDER — HYDROXYZINE HCL 25 MG PO TABS
25.0000 mg | ORAL_TABLET | Freq: Four times a day (QID) | ORAL | Status: AC | PRN
  Administered 2017-02-03 (×2): 25 mg via ORAL
  Filled 2017-02-01 (×2): qty 1

## 2017-02-01 NOTE — Care Management Note (Signed)
Case Management Note  Patient Details  Name: Sherri Francis MRN: [id number] Date of Birth: 02-16-1986  Subjective/Objective:                   septic pulmonary embolic with a 1.5 x 8.2SU tricuspid valve vegetation with mild regurgitation.     Action/Plan: Date:  February 01, 2017  Chart reviewed for concurrent status and case management needs.  Will continue to follow patient progress.  Discharge Planning: following for needs  Expected discharge date: [id number]  Velva Harman, BSN, [place], [place]   Expected Discharge Date:   (unknown)               Expected Discharge Plan:  Home/Self Care  In-House Referral:     Discharge planning Services  CM Consult  Post Acute Care Choice:    Choice offered to:     DME Arranged:    DME Agency:     HH Arranged:    HH Agency:     Status of Service:  In process, will continue to follow  If discussed at Long Length of Stay Meetings, dates discussed:    Additional Comments:  Leeroy Cha, RN 02/01/2017, 9:27 AM

## 2017-02-01 NOTE — Progress Notes (Signed)
    [facility]    Date of Admission:  01/27/2017   Total days of antibiotics 6        Day 5 nafcillin      ID: Sherri Francis is a 31 y.o. 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  Medications:  . buprenorphine-naloxone  2 tablet Sublingual BID  . cloNIDine  0.1 mg Oral QAC breakfast  . enoxaparin (LOVENOX) injection  40 mg Subcutaneous Q24H  . FLUoxetine  10 mg Oral Daily  . nicotine  14 mg Transdermal Daily  . potassium chloride  40 mEq Oral Daily    Objective: Vital signs in last 24 hours: Temp:  [98.1 F (36.7 C)-99.6 F (37.6 C)] 99.6 F (37.6 C) (08/27 0555) Pulse Rate:  [92-100] 92 (08/27 0555) Resp:  [16] 16 (08/27 0555) BP: (105-113)/(49-64) 105/49 (08/27 0555) SpO2:  [99 %] 99 % (08/27 0555)    Lab Results  Recent Labs  01/30/17 0850  WBC 11.7*  HGB 10.0*  HCT 30.8*  NA 134*  K 4.0  CL 104  CO2 23  BUN 8  CREATININE 0.46    Microbiology: 8/23 blood cx NGTD Studies/Results: US Abdomen Limited Ruq  Result Date: 01/31/2017 CLINICAL DATA:  RIGHT upper quadrant pain with vomiting. EXAM: ULTRASOUND ABDOMEN LIMITED RIGHT UPPER QUADRANT COMPARISON:  01/27/2017. FINDINGS: Gallbladder: No gallstones or wall thickening visualized. No sonographic Murphy sign noted by sonographer. Common bile duct: Diameter: 4 mm, within normal limits. Liver: No focal lesion identified. Within normal limits in parenchymal echogenicity. Portal vein is patent on color Doppler imaging with normal direction of blood flow towards the liver. Compared with prior, no significant change.  IMPRESSION: Negative RIGHT upper quadrant ultrasound. No cause seen for the reported symptoms. Electronically Signed   By: Staci Righter M.D.   On: 01/31/2017 14:20    Assessment/Plan: MSSA TV endocarditis with pulmonary septic emboli = given the size of her TV vegetation of  1.5 x 1.2cm -. would need treatment with nafcillin for 28 days using 8/23 as day 1. Finishes abtx on 9/13. Will need to discuss with patient and family placement issue.   Baxter Flattery [facility] for Infectious Diseases Cell: [phone] Pager: [phone]  02/01/2017, 2:03 PM

## 2017-02-01 NOTE — Progress Notes (Signed)
PROGRESS NOTE  Sherri Francis  HAL:[id number] DOB: 1986-03-04 DOA: 01/27/2017 PCP: Patient, No Pcp Per  Brief Narrative:   Sherri Francis is a 31 y.o. female with history of IVDA, hepatitis C, alcohol use, tobacco abuse, depression/anxiety with suicide attempts who presented to the ER on 8/19 with about a week of fever and cough.  She was found to have bilateral pneumonia and was given vancomycin and ceftriaxone.  She was going to be admitted to the hospital, but she left against medical advice because her withdrawal symptoms were severe.  She was called back on the same date because 2/2 blood cultures were positive for MSSA and advised to come back to the hospital.  She returned to the ER on 8/22.  She has septic pulmonary embolic with a 1.5 x 7.3ZH tricuspid valve vegetation with mild regurgitation.    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 Anemia of chronic disease -  D/c iron supplementation due to nausea and vomiting  Hepatitis C  -  LFTs wnl -  hep C log10 5.748/ 560,000 IU/mL, genotype  1a  Hypokalemia, mild, resolved.  Depression/anxiety -  Started on 8/26 prozac -  Gabapentin prn anxiety -  NO BENZOs  DVT prophylaxis: lovenox  Code Status: full code Family Communication:  Patient and her boyfriend Disposition Plan:  Pt is interested in quitting and has been on suboxone previously.  Has a clinic in [place] that she interested in following up with once she is discharged.  Encouraged her to call to verify that she has a spot once she is discharged because wait-lists can be long.  She does not want to transition off suboxone to go to SNF because she is afraid she will relapse.  Case discussed with Dr. Reynaldo Minium because no SNF will take her on suboxone.  Options are:  Transition off suboxone, place PICC and send to SNF, but high likelihood she will leave AMA from SNF.  Transition off suboxone and give oral ABX which is not the recommended treatment for endocarditis.  Continue hospitalization on suboxone if patient can verify follow up at rehab clinic at the time she would be discharged (9/13).  Patient would like to remain hospitalized.  Boyfriend and mother are supportive of this plan.  Patient's boyfriend is currently sober and in a rehab program.    Consultants:   ID  Psychiatry  Procedures:  CT chest ECHO  Antimicrobials:  Anti-infectives    Start     Dose/Rate Route Frequency Ordered Stop   01/28/17 1600  nafcillin 2 g in dextrose 5 % 100 mL IVPB     2 g 200 mL/hr over 30 Minutes Intravenous Every 4 hours 01/28/17 1445     01/28/17 1445  nafcillin injection 2 g  Status:  Discontinued     2 g Intravenous Every 4 hours 01/28/17 1435 01/28/17 1444   01/27/17 2200  ceFAZolin (ANCEF) IVPB 2g/100 mL premix  Status:  Discontinued     2 g 200 mL/hr over 30 Minutes Intravenous Every 8 hours 01/27/17 1702 01/28/17 1435   01/27/17 1400  vancomycin (VANCOCIN) IVPB 1000 mg/200 mL premix     1,000 mg 200 mL/hr over 60 Minutes Intravenous  Once 01/27/17 1348 01/27/17  1606       Subjective:  Ongoing fevers and sweats. Increased cough causing some pleuritic chest pains.  RUQ is better.    Objective: Vitals:   01/31/17 1325 01/31/17 2108 02/01/17 0555 02/01/17 1438  BP: (!) 110/54 113/64 (!) 105/49 (!) 107/51  Pulse: 91 100 92 91  Resp: 16 16 16 16   Temp: 98.4 F (36.9 C) 98.1 F (36.7 C) 99.6 F (37.6 C) 99.9 F (37.7 C)  TempSrc: Oral Oral Oral Oral  SpO2: 99% 99% 99% 100%  Weight: 47.7 kg (105 lb 2.6 oz)     Height:        Intake/Output Summary (Last 24 hours) at 02/01/17 1933 Last data filed at 02/01/17 1700  Gross per 24 hour  Intake              880 ml  Output                0 ml  Net              880 ml   Filed Weights   01/28/17 2026 01/30/17 1315 01/31/17 1325  Weight: 45.3 kg (99 lb 13.9 oz) 50.9 kg (112 lb 3.4 oz) 47.7 kg (105 lb 2.6 oz)    Examination:  General exam:  Adult female, thin.  No acute distress.  HEENT:  NCAT, MMM Respiratory system: Clear to auscultation bilaterally Cardiovascular system: Regular rate and rhythm, normal S1/S2. No murmurs, rubs, gallops or clicks.  Warm extremities Gastrointestinal system: Normal active bowel sounds, soft, nondistended, mildly TTP in the RUQ without rebound or guarding.   MSK:  Normal tone and bulk, no lower extremity edema Neuro:  Grossly intact Derm:  Track marks on arms are healing/scarring  Data Reviewed: I have personally reviewed following labs and imaging studies  CBC:  Recent Labs Lab 01/27/17 1310 01/28/17 0856 01/29/17 0618 01/30/17 0850  WBC 14.4* 15.6* 11.5* 11.7*  NEUTROABS 10.8*  --   --   --   HGB 9.4* 9.5* 9.7* 10.0*  HCT 28.4* 28.7* 29.4* 30.8*  MCV 80.2 81.5 81.2 81.9  PLT 380 500* 463* 595*   Basic Metabolic Panel:  Recent Labs Lab 01/27/17 1310 01/28/17 0856 01/29/17 0618 01/30/17 0850  NA 138 136 135 134*  K 3.0* 3.4* 3.2* 4.0  CL 103 106 104 104  CO2 27 24 25 23   GLUCOSE 137* 114* 112* 104*  BUN 10 7 8 8   CREATININE 0.38*  0.45 0.43* 0.46  CALCIUM 7.4* 7.7* 7.6* 7.8*  MG  --  2.0 2.0 1.8   GFR: Estimated Creatinine Clearance: 78.1 mL/min (by C-G formula based on SCr of 0.46 mg/dL). Liver Function Tests:  Recent Labs Lab 01/27/17 1310  AST 26  ALT 20  ALKPHOS 63  BILITOT 0.1*  PROT  6.6  ALBUMIN 2.4*    Recent Labs Lab 01/27/17 1310  LIPASE 24   No results for input(s): AMMONIA in the last 168 hours. Coagulation Profile: No results for input(s): INR, PROTIME in the last 168 hours. Cardiac Enzymes: No results for input(s): CKTOTAL, CKMB, CKMBINDEX, TROPONINI in the last 168 hours. BNP (last 3 results) No results for input(s): PROBNP in the last 8760 hours. HbA1C: No results for input(s): HGBA1C in the last 72 hours. CBG: No results for input(s): GLUCAP in the last 168 hours. Lipid Profile: No results for input(s): CHOL, HDL, LDLCALC, TRIG, CHOLHDL, LDLDIRECT in the last 72 hours. Thyroid Function Tests: No results for input(s): TSH, T4TOTAL, FREET4, T3FREE, THYROIDAB in the last 72 hours. Anemia Panel: No results for input(s): VITAMINB12, FOLATE, FERRITIN, TIBC, IRON, RETICCTPCT in the last 72 hours. Urine analysis:    Component Value Date/Time   COLORURINE STRAW (A) 01/27/2017 1516   APPEARANCEUR CLEAR 01/27/2017 1516   LABSPEC 1.004 (L) 01/27/2017 1516   PHURINE 7.0 01/27/2017 1516   GLUCOSEU NEGATIVE 01/27/2017 1516   HGBUR SMALL (A) 01/27/2017 1516   BILIRUBINUR NEGATIVE 01/27/2017 1516   KETONESUR NEGATIVE 01/27/2017 1516   PROTEINUR NEGATIVE 01/27/2017 1516   UROBILINOGEN 0.2 10/15/2012 1341   NITRITE NEGATIVE 01/27/2017 1516   LEUKOCYTESUR SMALL (A) 01/27/2017 1516   Sepsis Labs: @LABRCNTIP (procalcitonin:4,lacticidven:4)  ) Recent Results (from the past 240 hour(s))  Culture, blood (routine x 2)     Status: Abnormal   Collection Time: 01/24/17  8:23 AM  Result Value Ref Range Status   Specimen Description BLOOD LEFT FOREARM  Final   Special Requests   Final     BOTTLES DRAWN AEROBIC AND ANAEROBIC Blood Culture adequate volume   Culture  Setup Time   Final    IN BOTH AEROBIC AND ANAEROBIC BOTTLES GRAM POSITIVE COCCI IN CLUSTERS CRITICAL RESULT CALLED TO, READ BACK BY AND VERIFIED WITH: TO SMAYNARD(RN) BY TCLEVELAND 01/24/2017 AT 11:52PM  CRITICAL RESULT CALLED TO, READ BACK BY AND VERIFIED WITH: Hillary Bow AT [place] 5176 01/25/17 BY L BENFIELD Performed at [facility], [place] [address], [place], [place] [address]    Culture STAPHYLOCOCCUS AUREUS (A)  Final   Report Status 01/26/2017 FINAL  Final   Organism ID, Bacteria STAPHYLOCOCCUS AUREUS  Final      Susceptibility   Staphylococcus aureus - MIC*    CIPROFLOXACIN <=0.5 SENSITIVE Sensitive     ERYTHROMYCIN <=0.25 SENSITIVE Sensitive     GENTAMICIN <=0.5 SENSITIVE Sensitive     OXACILLIN 0.5 SENSITIVE Sensitive     TETRACYCLINE <=1 SENSITIVE Sensitive     VANCOMYCIN <=0.5 SENSITIVE Sensitive     TRIMETH/SULFA <=10 SENSITIVE Sensitive     CLINDAMYCIN <=0.25 SENSITIVE Sensitive     RIFAMPIN <=0.5 SENSITIVE Sensitive     Inducible Clindamycin NEGATIVE Sensitive     * STAPHYLOCOCCUS AUREUS  Blood Culture ID Panel (Reflexed)     Status: Abnormal   Collection Time: 01/24/17  8:23 AM  Result Value Ref Range Status   Enterococcus species NOT DETECTED NOT DETECTED Final   Vancomycin resistance NOT DETECTED NOT DETECTED Final   Listeria monocytogenes NOT DETECTED NOT DETECTED Final   Staphylococcus species DETECTED (A) NOT DETECTED Final    Comment: CRITICAL RESULT CALLED TO, READ BACK BY AND VERIFIED WITH: TO  SMAYNARD(RN) BY TCLEVELAND 01/24/2017 AT 11:52PM    Staphylococcus aureus DETECTED (A) NOT DETECTED Final    Comment: CRITICAL RESULT CALLED TO, READ BACK BY AND VERIFIED  WITH: TO  SMAYNARD(RN) BY [facility] 01/24/2017 AT 11:52PM    Methicillin resistance NOT DETECTED NOT DETECTED Final   Streptococcus species NOT DETECTED NOT DETECTED Final   Streptococcus agalactiae NOT  DETECTED NOT DETECTED Final   Streptococcus pneumoniae NOT DETECTED NOT DETECTED Final   Streptococcus pyogenes NOT DETECTED NOT DETECTED Final   Acinetobacter baumannii NOT DETECTED NOT DETECTED Final   Enterobacteriaceae species NOT DETECTED NOT DETECTED Final   Enterobacter cloacae complex NOT DETECTED NOT DETECTED Final   Escherichia coli NOT DETECTED NOT DETECTED Final   Klebsiella oxytoca NOT DETECTED NOT DETECTED Final   Klebsiella pneumoniae NOT DETECTED NOT DETECTED Final   Proteus species NOT DETECTED NOT DETECTED Final   Serratia marcescens NOT DETECTED NOT DETECTED Final   Carbapenem resistance NOT DETECTED NOT DETECTED Final   Haemophilus influenzae NOT DETECTED NOT DETECTED Final   Neisseria meningitidis NOT DETECTED NOT DETECTED Final   Pseudomonas aeruginosa NOT DETECTED NOT DETECTED Final   Candida albicans NOT DETECTED NOT DETECTED Final   Candida glabrata NOT DETECTED NOT DETECTED Final   Candida krusei NOT DETECTED NOT DETECTED Final   Candida parapsilosis NOT DETECTED NOT DETECTED Final   Candida tropicalis NOT DETECTED NOT DETECTED Final    Comment: Performed at [facility], [place] [address] [place], [place] [address]  Culture, blood (routine x 2)     Status: Abnormal   Collection Time: 01/24/17  8:41 AM  Result Value Ref Range Status   Specimen Description BLOOD RIGHT HAND  Final   Special Requests IN PEDIATRIC BOTTLE Blood Culture adequate volume  Final   Culture  Setup Time   Final    IN PEDIATRIC BOTTLE GRAM POSITIVE COCCI IN CLUSTERS CRITICAL RESULT CALLED TO, READ BACK BY AND VERIFIED WITH: D FRANCIS,RN AT [place] 3875 01/25/17 BY L BENFIELD    Culture (A)  Final    STAPHYLOCOCCUS AUREUS SUSCEPTIBILITIES PERFORMED ON PREVIOUS CULTURE WITHIN THE LAST 5 DAYS.    Report Status 01/27/2017 FINAL  Final  MRSA PCR Screening     Status: None   Collection Time: 01/24/17 10:34 AM  Result Value Ref Range Status   MRSA by PCR NEGATIVE NEGATIVE Final     Comment:        The GeneXpert MRSA Assay (FDA approved for NASAL specimens only), is one component of a comprehensive MRSA colonization surveillance program. It is not intended to diagnose MRSA infection nor to guide or monitor treatment for MRSA infections.   Blood culture (routine x 2)     Status: Abnormal   Collection Time: 01/27/17  2:10 PM  Result Value Ref Range Status   Specimen Description BLOOD RIGHT ANTECUBITAL  Final   Special Requests   Final    BOTTLES DRAWN AEROBIC AND ANAEROBIC Blood Culture adequate volume   Culture  Setup Time   Final    GRAM POSITIVE COCCI IN BOTH AEROBIC AND ANAEROBIC BOTTLES CRITICAL RESULT CALLED TO, READ BACK BY AND VERIFIED WITH: PHARMD E JACKSON [id number] MLM    Culture (A)  Final    STAPHYLOCOCCUS AUREUS SUSCEPTIBILITIES PERFORMED ON PREVIOUS CULTURE WITHIN THE LAST 5 DAYS. Performed at [facility], [place] [address] [place], [place] [address]    Report Status 01/30/2017 FINAL  Final  Blood culture (routine x 2)     Status: Abnormal   Collection Time: 01/27/17  2:32 PM  Result Value Ref Range Status   Specimen Description BLOOD LEFT HAND  Final   Special  Requests   Final    BOTTLES DRAWN AEROBIC AND ANAEROBIC Blood Culture adequate volume   Culture  Setup Time   Final    GRAM POSITIVE COCCI IN BOTH AEROBIC AND ANAEROBIC BOTTLES CRITICAL RESULT CALLED TO, READ BACK BY AND VERIFIED WITH: Irwin Brakeman AT 2778 01/29/17 BY L BENFIELD    Culture (A)  Final    STAPHYLOCOCCUS AUREUS SUSCEPTIBILITIES PERFORMED ON PREVIOUS CULTURE WITHIN THE LAST 5 DAYS. VIRIDANS STREPTOCOCCUS THE SIGNIFICANCE OF ISOLATING THIS ORGANISM FROM A SINGLE SET OF BLOOD CULTURES WHEN MULTIPLE SETS ARE DRAWN IS UNCERTAIN. PLEASE NOTIFY THE MICROBIOLOGY DEPARTMENT WITHIN ONE WEEK IF SPECIATION AND SENSITIVITIES ARE REQUIRED. Performed at [facility], [place] [address] [place], [place] [address]    Report Status 01/31/2017 FINAL  Final   Culture, blood (single) w Reflex to ID Panel     Status: None (Preliminary result)   Collection Time: 01/28/17  8:56 AM  Result Value Ref Range Status   Specimen Description BLOOD RIGHT HAND  Final   Special Requests IN PEDIATRIC BOTTLE Blood Culture adequate volume  Final   Culture   Final    NO GROWTH 4 DAYS Performed at [facility], [place] [address] [place], [place] [address]    Report Status PENDING  Incomplete  Culture, blood (single) w Reflex to ID Panel     Status: None (Preliminary result)   Collection Time: 01/29/17  8:17 AM  Result Value Ref Range Status   Specimen Description BLOOD LEFT HAND  Final   Special Requests IN PEDIATRIC BOTTLE Blood Culture adequate volume  Final   Culture   Final    NO GROWTH 3 DAYS Performed at [facility], [place] [address], [place], [place] [address]    Report Status PENDING  Incomplete  Culture, blood (single) w Reflex to ID Panel     Status: None (Preliminary result)   Collection Time: 01/30/17  8:50 AM  Result Value Ref Range Status   Specimen Description BLOOD BLOOD LEFT HAND  Final   Special Requests IN PEDIATRIC BOTTLE Blood Culture adequate volume  Final   Culture   Final    NO GROWTH 2 DAYS Performed at [facility], [place] [address], [place], [place] [address]    Report Status PENDING  Incomplete      Radiology Studies: US Abdomen Limited Ruq  Result Date: 01/31/2017 CLINICAL DATA:  RIGHT upper quadrant pain with vomiting. EXAM: ULTRASOUND ABDOMEN LIMITED RIGHT UPPER QUADRANT COMPARISON:  01/27/2017. FINDINGS: Gallbladder: No gallstones or wall thickening visualized. No sonographic Murphy sign noted by sonographer. Common bile duct: Diameter: 4 mm, within normal limits. Liver: No focal lesion identified. Within normal limits in parenchymal echogenicity. Portal vein is patent on color Doppler imaging with normal direction of blood flow towards the liver. Compared with prior, no significant change. IMPRESSION:  Negative RIGHT upper quadrant ultrasound. No cause seen for the reported symptoms. Electronically Signed   By: Staci Righter M.D.   On: 01/31/2017 14:20     Scheduled Meds: . buprenorphine-naloxone  2 tablet Sublingual BID  . cloNIDine  0.1 mg Oral QAC breakfast  . enoxaparin (LOVENOX) injection  40 mg Subcutaneous Q24H  . FLUoxetine  10 mg Oral Daily  . nicotine  14 mg Transdermal Daily  . potassium chloride  40 mEq Oral Daily  . potassium chloride  40 mEq Oral Daily   Continuous Infusions: . nafcillin IV Stopped (02/01/17 1708)     LOS: 5  days    Time spent: 30 min    Janece Canterbury, MD [facility] Pager [phone]  If 7PM-7AM, please contact night-coverage [email or web address] Password [email or web address] 02/01/2017, 7:33 PM

## 2017-02-01 NOTE — Clinical Social Work Note (Signed)
Clinical Social Work Assessment  Patient Details  Name: Sherri Francis MRN: [id number] Date of Birth: March 29, 1986  Date of referral:  02/01/17               Reason for consult:  Substance Use/ETOH Abuse                Permission sought to share information with:  Family Supports Permission granted to share information::     Name::     Tammy   Agency::     Relationship::  Mother   Contact Information:     Housing/Transportation Living arrangements for the past 2 months:  Hotel/Motel, Homeless Shelter Source of Information:  Patient Patient Interpreter Needed:  None Criminal Activity/Legal Involvement Pertinent to Current Situation/Hospitalization:  No - Comment as needed Significant Relationships:  Parents, Friend Lives with:  Friends Do you feel safe going back to the place where you live?  Yes Need for family participation in patient care:  Yes (Comment)  Care giving concerns:  SNF placement for IV antibiotics,  patient has no insurance and IV drug user and is currently on suboxane. Patient will not be abel to d/c on Suboxone, physician inquiring with Medical director about other options.     Social Worker assessment / plan:  CSW met with patient at bedside, patient calm and agreeable to talk. Patient reports she has history of heroin use, pt. Has Bacteremia and will need IV antibiotics  For long course of treatment. CSW explain and dicussed possible discharge plan at this time. Patient called mother for her to be apart of conversation. Patient and mother both concern about the patient not having suboxone at discharge. Patient reports she takes at least 32 mg and will withdraw without them. Patient understands she may be placed in a facility in [place], patient reports understanding. Patient boyfriend arrived during assessment, pt. Reported understanding and gave permission for csw to talk with her mother.  CSW discussed placement options with Assistant Director/ Patient may need a  difficult to place bed. CSW will continue to follow for placement.   Plan: SNF w. IV antibiotics nafcillin    Employment status:  Unemployed Insurance information:  Self Pay (Medicaid Pending) PT Recommendations:  Not assessed at this time Information / Referral to community resources:   SNF  Patient/Family's Response to care: Patient express concerns about her current discharge plan, not having suboxone while at SNF.   Patient/Family's Understanding of and Emotional Response to Diagnosis, Current Treatment, and Prognosis: Patient mother involved in care, she request the patient be discharge on suboxone. She is concerned about pt. future and need for a substance abuse treatment program after medical treatment at SNF.   Emotional Assessment Appearance:  Appears stated age Attitude/Demeanor/Rapport:    Affect (typically observed):  Accepting, Calm Orientation:  Oriented to Self, Oriented to Place, Oriented to Situation, Oriented to  Time Alcohol / Substance use:  Illicit Drugs Psych involvement (Current and /or in the community):  Yes   Discharge Needs  Concerns to be addressed:  Discharge Planning Concerns, Substance Abuse Concerns Readmission within the last 30 days:  Yes Current discharge risk:  Substance Abuse Barriers to Discharge:  No SNF bed, Continued Medical Work up, Active Substance Use, Programmer, applications ([place]), Inadequate or no insurance   Lia Hopping, [place] 02/01/2017, 1:04 PM

## 2017-02-02 DIAGNOSIS — F1120 Opioid dependence, uncomplicated: Secondary | ICD-10-CM

## 2017-02-02 LAB — CULTURE, BLOOD (SINGLE)
CULTURE: NO GROWTH
SPECIAL REQUESTS: ADEQUATE

## 2017-02-02 MED ORDER — SODIUM CHLORIDE 0.9 % IV SOLN
INTRAVENOUS | Status: DC
  Administered 2017-02-02: 21:00:00 via INTRAVENOUS

## 2017-02-02 NOTE — Progress Notes (Signed)
PROGRESS NOTE  Sherri Francis  URK:[id number] DOB: 02/14/1986 DOA: 01/27/2017 PCP: Patient, No Pcp Per  Brief Narrative:   Tienna Francis is a 31 y.o. female with history of IVDA, hepatitis C, alcohol use, tobacco abuse, depression/anxiety with suicide attempts who presented to the ER on 8/19 with about a week of fever and cough.  She was found to have bilateral pneumonia and was given vancomycin and ceftriaxone.  She was going to be admitted to the hospital, but she left against medical advice because her withdrawal symptoms were severe.  She was called back on the same date because 2/2 blood cultures were positive for MSSA and advised to come back to the hospital.  She returned to the ER on 8/22.  She has septic pulmonary embolic with a 1.5 x 8.3TD tricuspid valve vegetation with mild regurgitation.    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 Anemia of chronic disease -  D/c iron supplementation due to nausea and vomiting  Hepatitis C  -  LFTs wnl -  hep C log10 5.748/ 560,000 IU/mL, genotype 1a  Hypokalemia, mild,  resolved.  Depression/anxiety -  Started on 8/26 prozac -  Gabapentin prn anxiety -  NO BENZOs  DVT prophylaxis: lovenox  Code Status: full code Family Communication:  Patient and her boyfriend Disposition Plan:  Pt is interested in quitting and has been on suboxone previously.  Has a clinic in [place] that she interested in following up with once she is discharged.  Encouraged her to call to verify that she has a spot once she is discharged because wait-lists can be long.  She does not want to transition off suboxone to go to SNF because she is afraid she will relapse.  Case discussed with Dr. Reynaldo Minium because no SNF will take her on suboxone.  Options are:  Transition off suboxone, place PICC and send to SNF, but high likelihood she will leave AMA from SNF.  Transition off suboxone and give oral ABX which is not the recommended treatment for endocarditis.  Continue hospitalization on suboxone if patient can verify follow up at rehab clinic at the time she would be discharged (9/13).  Patient would like to remain hospitalized.  Boyfriend and mother are supportive of this plan.  Patient's boyfriend is currently sober and just completed a rehab program.    Consultants:   ID  Psychiatry  Procedures:  CT chest ECHO  Antimicrobials:  Anti-infectives    Start     Dose/Rate Route Frequency Ordered Stop   01/28/17 1600  nafcillin 2 g in dextrose  5 % 100 mL IVPB     2 g 200 mL/hr over 30 Minutes Intravenous Every 4 hours 01/28/17 1445     01/28/17 1445  nafcillin injection 2 g  Status:  Discontinued     2 g Intravenous Every 4 hours 01/28/17 1435 01/28/17 1444   01/27/17 2200  ceFAZolin (ANCEF) IVPB 2g/100 mL premix  Status:  Discontinued     2 g 200 mL/hr over 30 Minutes Intravenous Every 8 hours 01/27/17 1702 01/28/17 1435   01/27/17 1400  vancomycin (VANCOCIN) IVPB 1000 mg/200 mL premix     1,000 mg 200 mL/hr over 60 Minutes Intravenous  Once 01/27/17 1348 01/27/17 1606        Subjective:  Feeling better today.  Less sweaty/anxious.  Abdominal pains are improving.    Objective: Vitals:   02/02/17 0455 02/02/17 0803 02/02/17 1308 02/02/17 1309  BP: (!) 102/51 113/62 108/71 108/71  Pulse: (!) 111 (!) 112 (!) 104 (!) 104  Resp: 16 17 18 18   Temp: 99.2 F (37.3 C)  98.2 F (36.8 C) 98.2 F (36.8 C)  TempSrc: Oral Oral Axillary Axillary  SpO2: 98% 98% 100% 100%  Weight:      Height:        Intake/Output Summary (Last 24 hours) at 02/02/17 2003 Last data filed at 02/02/17 1711  Gross per 24 hour  Intake             1920 ml  Output                0 ml  Net             1920 ml   Filed Weights   01/28/17 2026 01/30/17 1315 01/31/17 1325  Weight: 45.3 kg (99 lb 13.9 oz) 50.9 kg (112 lb 3.4 oz) 47.7 kg (105 lb 2.6 oz)    Examination:  General exam:  Thin female.  No acute distress.  HEENT:  NCAT, MMM Respiratory system: Clear to auscultation bilaterally Cardiovascular system: Regular rate and rhythm, normal S1/S2. No murmurs, rubs, gallops or clicks.  Warm extremities Gastrointestinal system: Normal active bowel sounds, soft, nondistended, minimally TTP in the RUQ. MSK:  Normal tone and bulk, no lower extremity edema Neuro:  Grossly intact Derm:  Track mark bruising has faded   Data Reviewed: I have personally reviewed following labs and imaging studies  CBC:  Recent Labs Lab 01/27/17 1310 01/28/17 0856 01/29/17 0618 01/30/17 0850  WBC 14.4* 15.6* 11.5* 11.7*  NEUTROABS 10.8*  --   --   --   HGB 9.4* 9.5* 9.7* 10.0*  HCT 28.4* 28.7* 29.4* 30.8*  MCV 80.2 81.5 81.2 81.9  PLT 380 500* 463* 355*   Basic Metabolic Panel:  Recent Labs Lab 01/27/17 1310 01/28/17 0856 01/29/17 0618 01/30/17 0850  NA 138 136 135 134*  K 3.0* 3.4* 3.2* 4.0  CL 103 106 104 104  CO2 27 24 25 23   GLUCOSE 137* 114* 112* 104*  BUN 10 7 8 8   CREATININE 0.38* 0.45 0.43* 0.46  CALCIUM 7.4* 7.7* 7.6* 7.8*  MG  --  2.0 2.0 1.8   GFR: Estimated  Creatinine Clearance: 78.1 mL/min (by C-G formula based on SCr of 0.46 mg/dL). Liver Function Tests:  Recent Labs Lab 01/27/17 1310  AST 26  ALT 20  ALKPHOS 63  BILITOT 0.1*  PROT 6.6  ALBUMIN 2.4*    Recent Labs Lab 01/27/17 1310  LIPASE 24   No results for input(s): AMMONIA in the  last 168 hours. Coagulation Profile: No results for input(s): INR, PROTIME in the last 168 hours. Cardiac Enzymes: No results for input(s): CKTOTAL, CKMB, CKMBINDEX, TROPONINI in the last 168 hours. BNP (last 3 results) No results for input(s): PROBNP in the last 8760 hours. HbA1C: No results for input(s): HGBA1C in the last 72 hours. CBG: No results for input(s): GLUCAP in the last 168 hours. Lipid Profile: No results for input(s): CHOL, HDL, LDLCALC, TRIG, CHOLHDL, LDLDIRECT in the last 72 hours. Thyroid Function Tests: No results for input(s): TSH, T4TOTAL, FREET4, T3FREE, THYROIDAB in the last 72 hours. Anemia Panel: No results for input(s): VITAMINB12, FOLATE, FERRITIN, TIBC, IRON, RETICCTPCT in the last 72 hours. Urine analysis:    Component Value Date/Time   COLORURINE STRAW (A) 01/27/2017 1516   APPEARANCEUR CLEAR 01/27/2017 1516   LABSPEC 1.004 (L) 01/27/2017 1516   PHURINE 7.0 01/27/2017 1516   GLUCOSEU NEGATIVE 01/27/2017 1516   HGBUR SMALL (A) 01/27/2017 1516   BILIRUBINUR NEGATIVE 01/27/2017 1516   KETONESUR NEGATIVE 01/27/2017 1516   PROTEINUR NEGATIVE 01/27/2017 1516   UROBILINOGEN 0.2 10/15/2012 1341   NITRITE NEGATIVE 01/27/2017 1516   LEUKOCYTESUR SMALL (A) 01/27/2017 1516   Sepsis Labs: @LABRCNTIP (procalcitonin:4,lacticidven:4)  ) Recent Results (from the past 240 hour(s))  Culture, blood (routine x 2)     Status: Abnormal   Collection Time: 01/24/17  8:23 AM  Result Value Ref Range Status   Specimen Description BLOOD LEFT FOREARM  Final   Special Requests   Final    BOTTLES DRAWN AEROBIC AND ANAEROBIC Blood Culture adequate volume   Culture  Setup Time    Final    IN BOTH AEROBIC AND ANAEROBIC BOTTLES GRAM POSITIVE COCCI IN CLUSTERS CRITICAL RESULT CALLED TO, READ BACK BY AND VERIFIED WITH: TO SMAYNARD(RN) BY TCLEVELAND 01/24/2017 AT 11:52PM  CRITICAL RESULT CALLED TO, READ BACK BY AND VERIFIED WITH: Hillary Bow AT [place] 2956 01/25/17 BY L BENFIELD Performed at [facility], [place] [address], [place], [place] [address]    Culture STAPHYLOCOCCUS AUREUS (A)  Final   Report Status 01/26/2017 FINAL  Final   Organism ID, Bacteria STAPHYLOCOCCUS AUREUS  Final      Susceptibility   Staphylococcus aureus - MIC*    CIPROFLOXACIN <=0.5 SENSITIVE Sensitive     ERYTHROMYCIN <=0.25 SENSITIVE Sensitive     GENTAMICIN <=0.5 SENSITIVE Sensitive     OXACILLIN 0.5 SENSITIVE Sensitive     TETRACYCLINE <=1 SENSITIVE Sensitive     VANCOMYCIN <=0.5 SENSITIVE Sensitive     TRIMETH/SULFA <=10 SENSITIVE Sensitive     CLINDAMYCIN <=0.25 SENSITIVE Sensitive     RIFAMPIN <=0.5 SENSITIVE Sensitive     Inducible Clindamycin NEGATIVE Sensitive     * STAPHYLOCOCCUS AUREUS  Blood Culture ID Panel (Reflexed)     Status: Abnormal   Collection Time: 01/24/17  8:23 AM  Result Value Ref Range Status   Enterococcus species NOT DETECTED NOT DETECTED Final   Vancomycin resistance NOT DETECTED NOT DETECTED Final   Listeria monocytogenes NOT DETECTED NOT DETECTED Final   Staphylococcus species DETECTED (A) NOT DETECTED Final    Comment: CRITICAL RESULT CALLED TO, READ BACK BY AND VERIFIED WITH: TO  SMAYNARD(RN) BY TCLEVELAND 01/24/2017 AT 11:52PM    Staphylococcus aureus DETECTED (A) NOT DETECTED Final    Comment: CRITICAL RESULT CALLED TO, READ BACK BY AND VERIFIED WITH: TO  SMAYNARD(RN) BY TCLEVELAND 01/24/2017 AT 11:52PM    Methicillin resistance NOT DETECTED NOT DETECTED Final   Streptococcus species NOT  DETECTED NOT DETECTED Final   Streptococcus agalactiae NOT DETECTED NOT DETECTED Final   Streptococcus pneumoniae NOT DETECTED NOT DETECTED Final    Streptococcus pyogenes NOT DETECTED NOT DETECTED Final   Acinetobacter baumannii NOT DETECTED NOT DETECTED Final   Enterobacteriaceae species NOT DETECTED NOT DETECTED Final   Enterobacter cloacae complex NOT DETECTED NOT DETECTED Final   Escherichia coli NOT DETECTED NOT DETECTED Final   Klebsiella oxytoca NOT DETECTED NOT DETECTED Final   Klebsiella pneumoniae NOT DETECTED NOT DETECTED Final   Proteus species NOT DETECTED NOT DETECTED Final   Serratia marcescens NOT DETECTED NOT DETECTED Final   Carbapenem resistance NOT DETECTED NOT DETECTED Final   Haemophilus influenzae NOT DETECTED NOT DETECTED Final   Neisseria meningitidis NOT DETECTED NOT DETECTED Final   Pseudomonas aeruginosa NOT DETECTED NOT DETECTED Final   Candida albicans NOT DETECTED NOT DETECTED Final   Candida glabrata NOT DETECTED NOT DETECTED Final   Candida krusei NOT DETECTED NOT DETECTED Final   Candida parapsilosis NOT DETECTED NOT DETECTED Final   Candida tropicalis NOT DETECTED NOT DETECTED Final    Comment: Performed at [facility], [place] [address], [place], [place] [address]  Culture, blood (routine x 2)     Status: Abnormal   Collection Time: 01/24/17  8:41 AM  Result Value Ref Range Status   Specimen Description BLOOD RIGHT HAND  Final   Special Requests IN PEDIATRIC BOTTLE Blood Culture adequate volume  Final   Culture  Setup Time   Final    IN PEDIATRIC BOTTLE GRAM POSITIVE COCCI IN CLUSTERS CRITICAL RESULT CALLED TO, READ BACK BY AND VERIFIED WITH: D FRANCIS,RN AT [place] 2956 01/25/17 BY L BENFIELD    Culture (A)  Final    STAPHYLOCOCCUS AUREUS SUSCEPTIBILITIES PERFORMED ON PREVIOUS CULTURE WITHIN THE LAST 5 DAYS.    Report Status 01/27/2017 FINAL  Final  MRSA PCR Screening     Status: None   Collection Time: 01/24/17 10:34 AM  Result Value Ref Range Status   MRSA by PCR NEGATIVE NEGATIVE Final    Comment:        The GeneXpert MRSA Assay (FDA approved for NASAL specimens only), is  one component of a comprehensive MRSA colonization surveillance program. It is not intended to diagnose MRSA infection nor to guide or monitor treatment for MRSA infections.   Blood culture (routine x 2)     Status: Abnormal   Collection Time: 01/27/17  2:10 PM  Result Value Ref Range Status   Specimen Description BLOOD RIGHT ANTECUBITAL  Final   Special Requests   Final    BOTTLES DRAWN AEROBIC AND ANAEROBIC Blood Culture adequate volume   Culture  Setup Time   Final    GRAM POSITIVE COCCI IN BOTH AEROBIC AND ANAEROBIC BOTTLES CRITICAL RESULT CALLED TO, READ BACK BY AND VERIFIED WITH: PHARMD E JACKSON [id number] MLM    Culture (A)  Final    STAPHYLOCOCCUS AUREUS SUSCEPTIBILITIES PERFORMED ON PREVIOUS CULTURE WITHIN THE LAST 5 DAYS. Performed at [facility], [place] [address], [place], [place] [address]    Report Status 01/30/2017 FINAL  Final  Blood culture (routine x 2)     Status: Abnormal   Collection Time: 01/27/17  2:32 PM  Result Value Ref Range Status   Specimen Description BLOOD LEFT HAND  Final   Special Requests   Final    BOTTLES DRAWN AEROBIC AND ANAEROBIC Blood Culture adequate volume   Culture  Setup Time  Final    GRAM POSITIVE COCCI IN BOTH AEROBIC AND ANAEROBIC BOTTLES CRITICAL RESULT CALLED TO, READ BACK BY AND VERIFIED WITH: Irwin Brakeman AT 7510 01/29/17 BY L BENFIELD    Culture (A)  Final    STAPHYLOCOCCUS AUREUS SUSCEPTIBILITIES PERFORMED ON PREVIOUS CULTURE WITHIN THE LAST 5 DAYS. VIRIDANS STREPTOCOCCUS THE SIGNIFICANCE OF ISOLATING THIS ORGANISM FROM A SINGLE SET OF BLOOD CULTURES WHEN MULTIPLE SETS ARE DRAWN IS UNCERTAIN. PLEASE NOTIFY THE MICROBIOLOGY DEPARTMENT WITHIN ONE WEEK IF SPECIATION AND SENSITIVITIES ARE REQUIRED. Performed at [facility], [place] [address], [place], [place] [address]    Report Status 01/31/2017 FINAL  Final  Culture, blood (single) w Reflex to ID Panel     Status: None   Collection Time: 01/28/17  8:56 AM   Result Value Ref Range Status   Specimen Description BLOOD RIGHT HAND  Final   Special Requests IN PEDIATRIC BOTTLE Blood Culture adequate volume  Final   Culture   Final    NO GROWTH 5 DAYS Performed at [facility], [place] [address], [place], [place] [address]    Report Status 02/02/2017 FINAL  Final  Culture, blood (single) w Reflex to ID Panel     Status: None (Preliminary result)   Collection Time: 01/29/17  8:17 AM  Result Value Ref Range Status   Specimen Description BLOOD LEFT HAND  Final   Special Requests IN PEDIATRIC BOTTLE Blood Culture adequate volume  Final   Culture   Final    NO GROWTH 4 DAYS Performed at [facility], [place] [address], [place], [place] [address]    Report Status PENDING  Incomplete  Culture, blood (single) w Reflex to ID Panel     Status: None (Preliminary result)   Collection Time: 01/30/17  8:50 AM  Result Value Ref Range Status   Specimen Description BLOOD BLOOD LEFT HAND  Final   Special Requests IN PEDIATRIC BOTTLE Blood Culture adequate volume  Final   Culture   Final    NO GROWTH 3 DAYS Performed at [facility], [place] [address], [place], [place] [address]    Report Status PENDING  Incomplete      Radiology Studies: No results found.   Scheduled Meds: . buprenorphine-naloxone  2 tablet Sublingual BID  . enoxaparin (LOVENOX) injection  40 mg Subcutaneous Q24H  . FLUoxetine  10 mg Oral Daily  . nicotine  14 mg Transdermal Daily  . potassium chloride  40 mEq Oral Daily   Continuous Infusions: . nafcillin IV Stopped (02/02/17 1557)     LOS: 6 days    Time spent: 30 min    Janece Canterbury, MD [facility] Pager [phone]  If 7PM-7AM, please contact night-coverage [email or web address] Password [facility] 02/02/2017, 8:03 PM

## 2017-02-03 ENCOUNTER — Inpatient Hospital Stay (HOSPITAL_COMMUNITY): Payer: Self-pay

## 2017-02-03 LAB — CULTURE, BLOOD (SINGLE)
Culture: NO GROWTH
Special Requests: ADEQUATE

## 2017-02-03 MED ORDER — PROMETHAZINE HCL 25 MG/ML IJ SOLN
12.5000 mg | Freq: Once | INTRAMUSCULAR | Status: AC
  Administered 2017-02-03: 12.5 mg via INTRAVENOUS
  Filled 2017-02-03: qty 1

## 2017-02-03 MED ORDER — METOCLOPRAMIDE HCL 5 MG/ML IJ SOLN
5.0000 mg | Freq: Four times a day (QID) | INTRAMUSCULAR | Status: DC
  Administered 2017-02-03 – 2017-02-05 (×8): 5 mg via INTRAVENOUS
  Filled 2017-02-03 (×8): qty 2

## 2017-02-03 MED ORDER — METOCLOPRAMIDE HCL 5 MG/ML IJ SOLN
5.0000 mg | Freq: Four times a day (QID) | INTRAMUSCULAR | Status: DC | PRN
  Administered 2017-02-03: 5 mg via INTRAVENOUS
  Filled 2017-02-03: qty 2

## 2017-02-03 MED ORDER — ONDANSETRON HCL 4 MG/2ML IJ SOLN
4.0000 mg | Freq: Four times a day (QID) | INTRAMUSCULAR | Status: DC | PRN
  Administered 2017-02-03 – 2017-02-11 (×10): 4 mg via INTRAVENOUS
  Filled 2017-02-03 (×13): qty 2

## 2017-02-03 NOTE — Progress Notes (Addendum)
ID Progress Note   ID; 31yo F with chronic hepatitis C without hepatic coma, hx of IV drug use, depression/anxiety admitted for fevers and pleuritic chest pain found to have MSSA TV endocarditis with pulmonary septic emboli/cavitary lesions 8/22 blood cx 4/4 bottles MSSA and 1/4 strep viridans 8/23 blood cx NGTD 8/24 blood cx NGTD 8/25 blood cx NGTD  1) MSSA TV endocarditis with pulmonary septic emboli = given the size of her TV vegetation of  1.5 x 1.2cm -. would need treatment with nafcillin for 28 days using 8/23(day blood cx have cleared) as day 1. Plan to finish abtx on 9/13. SNF placement appears to be an issue. Recommend to keep inpatient to finish out course  2) hx of IV substance use = family and patient interested in substance abuse treatment program. Would recommend to keep on suboxone while in house  3) strep viridans bacteremia = difficult to say if it is contaminant vs polymicrobial endocarditis with IVDU, would be covered with nafcillin. No longer found on numerous subsequent blood cx.  4) anxiety/depression = appreciate psychiatry recs  Dr Johnnye Sima to see over the next few days

## 2017-02-03 NOTE — Progress Notes (Signed)
PROGRESS NOTE    Sherri Francis  ZOX:[id number] DOB: [birth year] DOA: 01/27/2017 PCP: Patient, No Pcp Per    Brief Narrative:  31 y.o.femalewith history of IVDA, hepatitis C, alcohol use, tobacco abuse, depression/anxiety with suicide attempts who presented to the ER on 8/19 with about a week of fever and cough. She was found to have bilateral pneumonia and was given vancomycin and ceftriaxone. She was going to be admitted to the hospital, but she left against medical advice because her withdrawal symptoms were severe. She was called back on the same date because 2/2 blood cultures were positive for MSSA and advised to come back to the hospital. She returned to the ER on 8/22. She has septic pulmonary embolic with a 1.5 x 8.1XB tricuspid valve vegetation with mild regurgitation.   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 No acute findings.  IVDA with heroin withdrawal -  Continue suboxone 32 mg daily - SW consulted - UDS positive for opiates -  HIV NR - Stable at present  Reported distant ETOH use - ETOH level negative - No evidence of withdrawals  Hypokalemia - Oral potassium replaced  Iron deficiency vs. Anemia of chronic disease -   had stopped iron supplementation due to nausea and  vomiting  Hepatitis C  - LFTs wnl -  hep C log10 5.748/ 560,000 IU/mL, genotype 1a - Will need close follow-up with hepatology and/or infectious disease  Hypokalemia, mild - Corrected  Depression/anxiety -  Started on 8/26 prozac -  Gabapentin prn anxiety -   avoid benzodiazepines - Stable at present at present   DVT prophylaxis: Lovenox subcutaneous  Code Status: Full code  Family Communication: Patient in room, family not at bedside  Disposition Plan: Uncertain at this time, discharge planning in progress  Consultants:  Infectious disease  Psychiatry   Procedures:  CT chest ECHO  Antimicrobials: Anti-infectives    Start     Dose/Rate Route Frequency Ordered Stop   01/28/17 1600  nafcillin 2 g in dextrose 5 % 100 mL IVPB     2 g 200 mL/hr over 30 Minutes Intravenous Every 4 hours 01/28/17 1445     01/28/17 1445  nafcillin injection 2 g  Status:  Discontinued     2 g Intravenous Every 4 hours 01/28/17 1435 01/28/17 1444   01/27/17 2200  ceFAZolin (ANCEF) IVPB 2g/100 mL premix  Status:  Discontinued     2 g 200 mL/hr over 30 Minutes Intravenous Every 8 hours 01/27/17 1702 01/28/17 1435   01/27/17 1400  vancomycin (VANCOCIN) IVPB 1000 mg/200 mL premix     1,000 mg 200 mL/hr over 60 Minutes Intravenous  Once 01/27/17 1348 01/27/17 1606       Subjective: Feeling better today.  Less sweaty/anxious.  Abdominal pains are improving.  Objective: Vitals:  02/02/17 2109 02/03/17 0011 02/03/17 0719 02/03/17 1438  BP: 113/67  124/67 127/71  Pulse: (!) 101 (!) 104 92 (!) 103  Resp: 16  18 20   Temp: 98.9 F (37.2 C)  97.9 F (36.6 C) 98.2 F (36.8 C)  TempSrc: Oral  Oral Oral  SpO2: 99%  98% 100%  Weight:      Height:        Intake/Output Summary (Last 24 hours) at 02/03/17 1620 Last data filed at 02/03/17 0500  Gross per 24 hour  Intake          1400.17 ml  Output                0 ml  Net          1400.17 ml   Filed Weights   01/28/17 2026 01/30/17 1315  01/31/17 1325  Weight: 45.3 kg (99 lb 13.9 oz) 50.9 kg (112 lb 3.4 oz) 47.7 kg (105 lb 2.6 oz)    Examination:  General exam:Lying in bed, appears uncomfortable, alert, oriented  Respiratory system: Clear to auscultation. Respiratory effort normal. Cardiovascular system: S1 & S2 heard, RRR. Gastrointestinal system: Abdomen is nondistended, soft and nontender. No organomegaly or masses felt. Normal bowel sounds heard. Central nervous system: Alert and oriented. No focal neurological deficits. Extremities: Symmetric 5 x 5 power. Skin: No rashes, lesions  Psychiatry: Judgement and insight appear normal. Mood & affect appropriate.   Data Reviewed: I have personally reviewed following labs and imaging studies  CBC:  Recent Labs Lab 01/28/17 0856 01/29/17 0618 01/30/17 0850  WBC 15.6* 11.5* 11.7*  HGB 9.5* 9.7* 10.0*  HCT 28.7* 29.4* 30.8*  MCV 81.5 81.2 81.9  PLT 500* 463* 546*   Basic Metabolic Panel:  Recent Labs Lab 01/28/17 0856 01/29/17 0618 01/30/17 0850  NA 136 135 134*  K 3.4* 3.2* 4.0  CL 106 104 104  CO2 24 25 23   GLUCOSE 114* 112* 104*  BUN 7 8 8   CREATININE 0.45 0.43* 0.46  CALCIUM 7.7* 7.6* 7.8*  MG 2.0 2.0 1.8   GFR: Estimated Creatinine Clearance: 78.1 mL/min (by C-G formula based on SCr of 0.46 mg/dL). Liver Function Tests: No results for input(s): AST, ALT, ALKPHOS, BILITOT, PROT, ALBUMIN in the last 168 hours. No results for input(s): LIPASE, AMYLASE in the last 168 hours. No results for input(s): AMMONIA in the last 168 hours. Coagulation Profile: No results for input(s): INR, PROTIME in the last 168 hours. Cardiac Enzymes: No results for input(s): CKTOTAL, CKMB, CKMBINDEX, TROPONINI in the last 168 hours. BNP (last 3 results) No results for input(s): PROBNP in the last 8760 hours. HbA1C: No results for input(s): HGBA1C in the last 72 hours. CBG: No results for input(s): GLUCAP in the last 168 hours. Lipid Profile: No results for input(s):  CHOL, HDL, LDLCALC, TRIG, CHOLHDL, LDLDIRECT in the last 72 hours. Thyroid Function Tests: No results for input(s): TSH, T4TOTAL, FREET4, T3FREE, THYROIDAB in the last 72 hours. Anemia Panel: No results for input(s): VITAMINB12, FOLATE, FERRITIN, TIBC, IRON, RETICCTPCT in the last 72 hours. Sepsis Labs: No results for input(s): PROCALCITON, LATICACIDVEN in the last 168 hours.  Recent Results (from the past 240 hour(s))  Blood culture (routine x 2)     Status: Abnormal   Collection Time: 01/27/17  2:10 PM  Result Value Ref Range Status   Specimen Description BLOOD RIGHT ANTECUBITAL  Final   Special Requests   Final    BOTTLES DRAWN AEROBIC AND ANAEROBIC Blood Culture  adequate volume   Culture  Setup Time   Final    GRAM POSITIVE COCCI IN BOTH AEROBIC AND ANAEROBIC BOTTLES CRITICAL RESULT CALLED TO, READ BACK BY AND VERIFIED WITH: PHARMD E JACKSON [phone]    Culture (A)  Final    STAPHYLOCOCCUS AUREUS SUSCEPTIBILITIES PERFORMED ON PREVIOUS CULTURE WITHIN THE LAST 5 DAYS. Performed at [facility], [place] [address] [place], [place] [address]    Report Status 01/30/2017 FINAL  Final  Blood culture (routine x 2)     Status: Abnormal   Collection Time: 01/27/17  2:32 PM  Result Value Ref Range Status   Specimen Description BLOOD LEFT HAND  Final   Special Requests   Final    BOTTLES DRAWN AEROBIC AND ANAEROBIC Blood Culture adequate volume   Culture  Setup Time   Final    GRAM POSITIVE COCCI IN BOTH AEROBIC AND ANAEROBIC BOTTLES CRITICAL RESULT CALLED TO, READ BACK BY AND VERIFIED WITH: Irwin Brakeman AT 0076 01/29/17 BY L BENFIELD    Culture (A)  Final    STAPHYLOCOCCUS AUREUS SUSCEPTIBILITIES PERFORMED ON PREVIOUS CULTURE WITHIN THE LAST 5 DAYS. VIRIDANS STREPTOCOCCUS THE SIGNIFICANCE OF ISOLATING THIS ORGANISM FROM A SINGLE SET OF BLOOD CULTURES WHEN MULTIPLE SETS ARE DRAWN IS UNCERTAIN. PLEASE NOTIFY THE MICROBIOLOGY DEPARTMENT WITHIN ONE WEEK IF SPECIATION AND  SENSITIVITIES ARE REQUIRED. Performed at [facility], [place] [address], [place], [place] [address]    Report Status 01/31/2017 FINAL  Final  Culture, blood (single) w Reflex to ID Panel     Status: None   Collection Time: 01/28/17  8:56 AM  Result Value Ref Range Status   Specimen Description BLOOD RIGHT HAND  Final   Special Requests IN PEDIATRIC BOTTLE Blood Culture adequate volume  Final   Culture   Final    NO GROWTH 5 DAYS Performed at [facility], [place] [address], [place], [place] [address]    Report Status 02/02/2017 FINAL  Final  Culture, blood (single) w Reflex to ID Panel     Status: None   Collection Time: 01/29/17  8:17 AM  Result Value Ref Range Status   Specimen Description BLOOD LEFT HAND  Final   Special Requests IN PEDIATRIC BOTTLE Blood Culture adequate volume  Final   Culture   Final    NO GROWTH 5 DAYS Performed at [facility], [place] [address] [place], [place] [address]    Report Status 02/03/2017 FINAL  Final  Culture, blood (single) w Reflex to ID Panel     Status: None (Preliminary result)   Collection Time: 01/30/17  8:50 AM  Result Value Ref Range Status   Specimen Description BLOOD BLOOD LEFT HAND  Final   Special Requests IN PEDIATRIC BOTTLE Blood Culture adequate volume  Final   Culture   Final    NO GROWTH 4 DAYS Performed at [facility], [place] [address], [place], [place] [address]    Report Status PENDING  Incomplete     Radiology Studies: Dg Abd Portable 1v  Result Date: 02/03/2017 CLINICAL DATA:  Nausea.  Vomiting.  Constipation. EXAM: PORTABLE ABDOMEN - 1 VIEW COMPARISON:  Abdominal ultrasound of 01/31/2017. FINDINGS: Two supine views. No dilated bowel loops. No abnormal abdominal calcifications. No appendicolith. Right hemidiaphragm elevation. Suspect residual right lower lobe airspace disease. IMPRESSION: No acute findings. Electronically Signed   By: Abigail Miyamoto M.D.   On: 02/03/2017 11:18    Scheduled Meds: .  buprenorphine-naloxone  2 tablet Sublingual BID  . enoxaparin (LOVENOX) injection  40 mg Subcutaneous Q24H  . FLUoxetine  10 mg Oral Daily  . metoCLOPramide (REGLAN) injection  5 mg Intravenous Q6H  . nicotine  14 mg Transdermal Daily   Continuous Infusions: . sodium chloride 10 mL/hr at 02/02/17 2107  . nafcillin IV 2 g (02/03/17 1617)     LOS: 7 days   Yelena Metzer, Orpah Melter, MD [facility] Pager [phone]  If 7PM-7AM, please contact night-coverage [email or web address] Password [email or web address] 02/03/2017, 4:20 PM

## 2017-02-04 DIAGNOSIS — B9561 Methicillin susceptible Staphylococcus aureus infection as the cause of diseases classified elsewhere: Secondary | ICD-10-CM

## 2017-02-04 DIAGNOSIS — I330 Acute and subacute infective endocarditis: Secondary | ICD-10-CM

## 2017-02-04 DIAGNOSIS — B954 Other streptococcus as the cause of diseases classified elsewhere: Secondary | ICD-10-CM

## 2017-02-04 LAB — CULTURE, BLOOD (SINGLE)
CULTURE: NO GROWTH
Special Requests: ADEQUATE

## 2017-02-04 MED ORDER — LIP MEDEX EX OINT
TOPICAL_OINTMENT | CUTANEOUS | Status: AC
  Administered 2017-02-04: 13:00:00
  Filled 2017-02-04: qty 7

## 2017-02-04 NOTE — Consult Note (Signed)
[facility] Consult   Reason for Consult:  Depression, anxiety and opioid dependence Referring Physician:  Dr. 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 Substance induced mood disorder ([place]) [F19.94] 01/30/2017  . Opioid type dependence, continuous ([place]) [F11.20] 01/30/2017  . 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 Bacteremia due to Staphylococcus aureus [R78.81] 01/27/2017  . Pneumonia [J18.9] 01/24/2017  . Pelvic abscess in female [N73.9] 01/13/2016  . PID (acute pelvic inflammatory disease) [N73.0] 01/13/2016    Total Time spent with patient: 1 hour  Subjective:   Sherri Francis is a 31 y.o. female patient admitted with pneumonia and bacteremia and history of IVDA.  HPI:  Sherri Francis is a 31 y.o. female, seen, chart reviewed and case discussed with hospitalist and CSW. Patient was [facility] to [facility] with the bacteremia and pneumonia secondary to IV drug use. Patient reportedly using heroin intravenously for the last 6-1/2 years and was sober about 11-1/2 years while receiving Suboxone treatment. Reportedly she was relapsed about 3 years ago. Patient has been abusing 1 gram daily. Patient also reported history of depression, anxiety. Patient reportedly comfortable with the her current treatment and has no withdrawal symptoms. Patient and her mother has been worried she might suffered with opioid withdrawal symptoms a medication was discontinued cold [place]. I have consulted hospital pharmacist regarding safety of tapering of this medication, reportedly patient should be tapered off 25% of the medication every 5 days without worried about withdrawal  symptoms coming back and possibly due to failing IV antibiotics required for the next 14 days while in the hospital. Patient denies suicidal/homicidal ideation, intention or plans or evidence of psychosis during my evaluation. Patient was previously seen by psychiatrist who recommended gabapentin and Effexor for controlling mood and anxiety. Patient has been compliant with medication and reportedly no side effects.  Past Psychiatric History:  History of IVDA, hepatitis C, alcohol use, tobacco abuse, depression/anxiety with suicide attempts.  Risk to Self: Is patient at risk for suicide?: No Risk to Others:   Prior Inpatient Therapy:   Prior Outpatient Therapy:    Past Medical History:  Past Medical History:  Diagnosis Date  . Anemia   . Anxiety   . Depression   . Hepatitis C   . IVDU (intravenous drug user)   . Suicide attempt [facility])    multiple attempts  . UTI (urinary tract infection)     Past Surgical History:  Procedure Laterality Date  . LAPAROSCOPY     Family History:  Family History  Problem Relation Age of Onset  . Heart disease Maternal Grandfather   . Anxiety disorder Maternal Grandfather   . Heart disease Maternal Aunt   . Alcohol abuse Father   . Drug abuse Father   . Cancer Neg Hx    Family Psychiatric  History: reportedly family history of substance abuse present patient has a boyfriend who also uses heroin. Social History:  History  Alcohol Use  . Yes    Comment: 6 beers perday- 2 years ago was last time hx of alcoholism per pt.     History  Drug Use  . Types: IV    Comment: heroin, last 2 days prior to admission    Social History  Social History  . Marital status: Single    Spouse name: N/A  . Number of children: N/A  . Years of education: N/A   Social History Main Topics  . Smoking status: Current Every Day Smoker    Packs/day: 0.50    Types: Cigarettes  . Smokeless tobacco: Never Used  . Alcohol use Yes     Comment: 6 beers perday- 2  years ago was last time hx of alcoholism per pt.  . Drug use: Yes    Types: IV     Comment: heroin, last 2 days prior to admission  . Sexual activity: Not Currently   Other Topics Concern  . None   Social History Narrative  . None   Additional Social History:    Allergies:  No Known Allergies  Labs: No results found for this or any previous visit (from the past 48 hour(s)).  Current Facility-Administered Medications  Medication Dose Route Frequency Provider Last Rate Last Dose  . 0.9 %  sodium chloride infusion   Intravenous Continuous Schorr, Rhetta Mura, NP 10 mL/hr at 02/02/17 2107    . acetaminophen (TYLENOL) tablet 650 mg  650 mg Oral Q6H PRN Short, Noah Delaine, MD       Or  . acetaminophen (TYLENOL) suppository 650 mg  650 mg Rectal Q6H PRN Short, Mackenzie, MD      . buprenorphine-naloxone (SUBOXONE) 8-2 mg per SL tablet 2 tablet  2 tablet Sublingual BID Janece Canterbury, MD   2 tablet at 02/04/17 1012  . dicyclomine (BENTYL) tablet 20 mg  20 mg Oral Q6H PRN Janece Canterbury, MD   20 mg at 02/03/17 1204  . enoxaparin (LOVENOX) injection 40 mg  40 mg Subcutaneous Q24H Adrian Saran, [place]   40 mg at 02/03/17 2055  . FLUoxetine (PROZAC) capsule 10 mg  10 mg Oral Daily Short, Mackenzie, MD   10 mg at 02/04/17 1011  . gabapentin (NEURONTIN) capsule 100 mg  100 mg Oral TID PRN Janece Canterbury, MD      . guaiFENesin-dextromethorphan (ROBITUSSIN DM) 100-10 MG/5ML syrup 5 mL  5 mL Oral Q4H PRN Janece Canterbury, MD   5 mL at 02/01/17 1906  . hydrOXYzine (ATARAX/VISTARIL) tablet 25 mg  25 mg Oral Q6H PRN Janece Canterbury, MD   25 mg at 02/03/17 1931  . loperamide (IMODIUM) capsule 2-4 mg  2-4 mg Oral PRN Short, Mackenzie, MD      . methocarbamol (ROBAXIN) tablet 500 mg  500 mg Oral Q8H PRN Short, Mackenzie, MD      . metoCLOPramide (REGLAN) injection 5 mg  5 mg Intravenous Q6H Donne Hazel, MD   5 mg at 02/04/17 0514  . nafcillin 2 g in dextrose 5 % 100 mL IVPB  2 g Intravenous Q4H  Carlyle Basques, MD   Stopped at 02/04/17 [phone]  . naproxen (NAPROSYN) tablet 500 mg  500 mg Oral BID PRN Janece Canterbury, MD   500 mg at 02/02/17 2144  . nicotine (NICODERM CQ - dosed in mg/24 hours) patch 14 mg  14 mg Transdermal Daily Short, Mackenzie, MD   14 mg at 02/04/17 1012  . ondansetron (ZOFRAN) injection 4 mg  4 mg Intravenous Q6H PRN Donne Hazel, MD   4 mg at 02/03/17 2251  . ondansetron (ZOFRAN-ODT) disintegrating tablet 4 mg  4 mg Oral Q6H PRN Janece Canterbury, MD   4 mg at 02/02/17 0930    Musculoskeletal: Strength & Muscle Tone: decreased Gait & Station: unable to stand Patient  leans: N/A  Psychiatric Specialty Exam: Physical Exam as per history and physical  ROS complaining of anxiety and worried about withdrawal symptoms, and requested not to stop medication without a per dose. Denied nausea, vomiting, abdomen pain, shortness of breath and chest pain. No Fever-chills, No Headache, No changes with Vision or hearing, reports vertigo No problems swallowing food or Liquids, No Chest pain, Cough or Shortness of Breath, No Abdominal pain, No Nausea or Vommitting, Bowel movements are regular, No Blood in stool or Urine, No dysuria, No new skin rashes or bruises, No new joints pains-aches,  No new weakness, tingling, numbness in any extremity, No recent weight gain or loss, No polyuria, polydypsia or polyphagia,   A full 10 point Review of Systems was done, except as stated above, all other Review of Systems were negative.  Blood pressure 139/80, pulse 94, temperature 98.6 F (37 C), temperature source Oral, resp. rate 20, height 5\' 1"  (1.549 m), weight 47.7 kg (105 lb 2.6 oz), last menstrual period 01/24/2017, SpO2 100 %.Body mass index is 19.87 kg/m.  General Appearance: Casual  Eye Contact:  Good  Speech:  Clear and Coherent  Volume:  Decreased  Mood:  Anxious  Affect:  Constricted and Depressed  Thought Process:  Coherent and Goal Directed  Orientation:   Full (Time, Place, and Person)  Thought Content:  WDL  Suicidal Thoughts:  No  Homicidal Thoughts:  No  Memory:  Immediate;   Good Recent;   Fair Remote;   Fair  Judgement:  Fair  Insight:  Fair  Psychomotor Activity:  Decreased  Concentration:  Concentration: Good and Attention Span: Fair  Recall:  Good  Fund of Knowledge:  Good  Language:  Good  Akathisia:  Negative  Handed:  Right  AIMS (if indicated):     Assets:  Communication Skills Desire for Improvement Housing Leisure Time Resilience Social Support  ADL's:  Intact  Cognition:  WNL  Sleep:        Treatment Plan Summary: patient admitted to the hospital with bacteremia due to Staphylococcus aureus and opioid-induced anxiety disorder severe use disorder with onset during withdrawal and substance induced mood disorder.  Recommendation: Patient Suboxone should be tapered off, 25% of the medication every 5 days, patient agreed with the slow tapered offand continue her IV antibiotic therapy for bacteremia. Continue fluoxetine 10 mg daily for depression and gabapentin 100 mg 3 times daily as needed for anxiety.  Appreciate psychiatric consultation and we sign off as of today Please contact [phone] or [phone] if needs further assistance   Disposition: No evidence of imminent risk to self or others at present.   Patient does not meet criteria for psychiatric inpatient admission. Supportive therapy provided about ongoing stressors.  Ambrose Finland, MD 02/04/2017 12:12 PM

## 2017-02-04 NOTE — Progress Notes (Signed)
INFECTIOUS DISEASE PROGRESS NOTE  ID: Kestrel Mis is a 31 y.o. 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) Active Problems:   Pneumonia   Bacteremia due to Staphylococcus aureus   Substance induced mood disorder ([place])   Opioid type dependence, continuous (HCC)  Subjective: Feels better, less pain.   Abtx:  Anti-infectives    Start     Dose/Rate Route Frequency Ordered Stop   01/28/17 1600  nafcillin 2 g in dextrose 5 % 100 mL IVPB     2 g 200 mL/hr over 30 Minutes Intravenous Every 4 hours 01/28/17 1445     01/28/17 1445  nafcillin injection 2 g  Status:  Discontinued     2 g Intravenous Every 4 hours 01/28/17 1435 01/28/17 1444   01/27/17 2200  ceFAZolin (ANCEF) IVPB 2g/100 mL premix  Status:  Discontinued     2 g 200 mL/hr over 30 Minutes Intravenous Every 8 hours 01/27/17 1702 01/28/17 1435   01/27/17 1400  vancomycin (VANCOCIN) IVPB 1000 mg/200 mL premix     1,000 mg 200 mL/hr over 60 Minutes Intravenous  Once 01/27/17 1348 01/27/17 1606      Medications:  Scheduled: . buprenorphine-naloxone  2 tablet Sublingual BID  . enoxaparin (LOVENOX) injection  40 mg Subcutaneous Q24H  . FLUoxetine  10 mg Oral Daily  . metoCLOPramide (REGLAN) injection  5 mg Intravenous Q6H  . nicotine  14 mg Transdermal Daily    Objective: Vital signs in last 24 hours: Temp:  [98.1 F (36.7 C)-98.6 F (37 C)] 98.6 F (37 C) (08/30 0523) Pulse Rate:  [94-116] 103 (08/30 1506) Resp:  [20-22] 21 (08/30 1506) BP: (110-142)/(67-87) 110/67 (08/30 1506) SpO2:  [99 %-100 %] 99 % (08/30 1506)   General appearance: alert, cooperative and no distress Resp: clear to auscultation bilaterally Cardio: regular rate and rhythm GI: normal findings: bowel sounds normal and soft, non-tender  Lab Results No results for input(s): WBC, HGB, HCT, NA, K, CL, CO2, BUN, CREATININE, GLU in the last 72  hours.  Invalid input(s): PLATELETS Liver Panel No results for input(s): PROT, ALBUMIN, AST, ALT, ALKPHOS, BILITOT, BILIDIR, IBILI in the last 72 hours. Sedimentation Rate No results for input(s): ESRSEDRATE in the last 72 hours. C-Reactive Protein No results for input(s): CRP in the last 72 hours.  Microbiology: Recent Results (from the past 240 hour(s))  Blood culture (routine x 2)     Status: Abnormal   Collection Time: 01/27/17  2:10 PM  Result Value Ref Range Status   Specimen Description BLOOD RIGHT ANTECUBITAL  Final   Special Requests   Final    BOTTLES DRAWN AEROBIC AND ANAEROBIC Blood Culture adequate volume   Culture  Setup Time   Final    GRAM POSITIVE COCCI IN BOTH AEROBIC AND ANAEROBIC BOTTLES CRITICAL RESULT CALLED TO, READ BACK BY AND VERIFIED WITH: PHARMD E JACKSON [id number] MLM    Culture (A)  Final    STAPHYLOCOCCUS AUREUS SUSCEPTIBILITIES PERFORMED ON PREVIOUS CULTURE WITHIN THE LAST 5 DAYS. Performed at [facility], [place] [address] [place], [place] [address]    Report Status 01/30/2017 FINAL  Final  Blood culture (routine x 2)     Status: Abnormal   Collection Time: 01/27/17  2:32 PM  Result Value Ref Range Status   Specimen Description BLOOD LEFT HAND  Final   Special Requests   Final    BOTTLES  DRAWN AEROBIC AND ANAEROBIC Blood Culture adequate volume   Culture  Setup Time   Final    GRAM POSITIVE COCCI IN BOTH AEROBIC AND ANAEROBIC BOTTLES CRITICAL RESULT CALLED TO, READ BACK BY AND VERIFIED WITH: Irwin Brakeman AT 2951 01/29/17 BY L BENFIELD    Culture (A)  Final    STAPHYLOCOCCUS AUREUS SUSCEPTIBILITIES PERFORMED ON PREVIOUS CULTURE WITHIN THE LAST 5 DAYS. VIRIDANS STREPTOCOCCUS THE SIGNIFICANCE OF ISOLATING THIS ORGANISM FROM A SINGLE SET OF BLOOD CULTURES WHEN MULTIPLE SETS ARE DRAWN IS UNCERTAIN. PLEASE NOTIFY THE MICROBIOLOGY DEPARTMENT WITHIN ONE WEEK IF SPECIATION AND SENSITIVITIES ARE REQUIRED. Performed at [facility], [place] [address] [place], [place] [address]    Report Status 01/31/2017 FINAL  Final  Culture, blood (single) w Reflex to ID Panel     Status: None   Collection Time: 01/28/17  8:56 AM  Result Value Ref Range Status   Specimen Description BLOOD RIGHT HAND  Final   Special Requests IN PEDIATRIC BOTTLE Blood Culture adequate volume  Final   Culture   Final    NO GROWTH 5 DAYS Performed at [facility], [place] [address] [place], [place] [address]    Report Status 02/02/2017 FINAL  Final  Culture, blood (single) w Reflex to ID Panel     Status: None   Collection Time: 01/29/17  8:17 AM  Result Value Ref Range Status   Specimen Description BLOOD LEFT HAND  Final   Special Requests IN PEDIATRIC BOTTLE Blood Culture adequate volume  Final   Culture   Final    NO GROWTH 5 DAYS Performed at [facility], [place] [address] [place], [place] [address]    Report Status 02/03/2017 FINAL  Final  Culture, blood (single) w Reflex to ID Panel     Status: None   Collection Time: 01/30/17  8:50 AM  Result Value Ref Range Status   Specimen Description BLOOD BLOOD LEFT HAND  Final   Special Requests IN PEDIATRIC BOTTLE Blood Culture adequate volume  Final   Culture   Final    NO GROWTH 5 DAYS Performed at [facility], [place] [address], [place], [place] [address]    Report Status 02/04/2017 FINAL  Final    Studies/Results: Dg Abd Portable 1v  Result Date: 02/03/2017 CLINICAL DATA:  Nausea.  Vomiting.  Constipation. EXAM: PORTABLE ABDOMEN - 1 VIEW COMPARISON:  Abdominal ultrasound of 01/31/2017. FINDINGS: Two supine views. No dilated bowel loops. No abnormal abdominal calcifications. No appendicolith. Right hemidiaphragm elevation. Suspect residual right lower lobe airspace disease. IMPRESSION: No acute findings. Electronically Signed   By: Abigail Miyamoto M.D.   On: 02/03/2017 11:18     Assessment/Plan: Hepatitis C Type 1a, VL > 1 million  IVDA  Endocarditis, Tricuspid Valve MSSA,  Viridans strep  Appreciate psych eval Plan for 28 days of anbx (complete on 9-13) She must be placed to continue her IV anbx. She thinks she will be staying here for duration.   If she is not placed she will need to be d/c on PO keflex 1g po bid opat consult suboxone per psych Available as needed.    No Known Allergies  OPAT Orders Discharge antibiotics: Nafcillni 2g q4h Duration: 28 days End Date: 02-18-17  [facility] Per Protocol: yes  Labs weekly while on IV antibiotics: __x CBC with differential __ BMP _x CMP __ CRP __ ESR __ Vancomycin trough  _x_ Please pull PIC at completion of IV antibiotics  __ Please leave PIC in place until doctor has seen patient or been notified  Fax weekly labs to [phone]  Clinic Follow Up Appt: 1 month           Bobby Rumpf Infectious Diseases (pager) [phone] [email or web address] 02/04/2017, 4:01 PM  LOS: 8 days

## 2017-02-04 NOTE — Progress Notes (Signed)
PROGRESS NOTE    Sherri Francis  OHY:[id number] DOB: 07-31-1985 DOA: 01/27/2017 PCP: Patient, No Pcp Per    Brief Narrative:  31 y.o.femalewith history of IVDA, hepatitis C, alcohol use, tobacco abuse, depression/anxiety with suicide attempts who presented to the ER on 8/19 with about a week of fever and cough. She was found to have bilateral pneumonia and was given vancomycin and ceftriaxone. She was going to be admitted to the hospital, but she left against medical advice because her withdrawal symptoms were severe. She was called back on the same date because 2/2 blood cultures were positive for MSSA and advised to come back to the hospital. She returned to the ER on 8/22. She has septic pulmonary embolic with a 1.5 x 9.4WN tricuspid valve vegetation with mild regurgitation.   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 No acute findings. Question gastroparesis. Improved with reglan  IVDA with heroin withdrawal -  Patient is continued on suboxone 32 mg daily - SW consulted - UDS positive for opiates -  HIV NR - Consulted Psychiatry with  Recommendations on taper by 4mg  every 5 days.  Reported distant ETOH use - ETOH level negative - without evidence of ETOH  withdrawals  Hypokalemia - Recently corrected  Iron deficiency vs. Anemia of chronic disease -   had stopped iron supplementation due to nausea and vomiting - Stable at present  Hepatitis C  - LFTs wnl -  hep C log10 5.748/ 560,000 IU/mL, genotype 1a - Will need close follow-up with hepatology and/or infectious disease when discharged  Hypokalemia, mild - Corrected - Stable at this time  Depression/anxiety -  Started on 8/26 prozac -  Gabapentin prn anxiety -   avoid benzodiazepines -  Currently stable  DVT prophylaxis: Lovenox subcutaneous  Code Status: Full code  Family Communication: Patient in room, family not at bedside  Disposition Plan: Uncertain at this time, discharge planning in progress  Consultants:  Infectious disease  Psychiatry   Procedures:  CT chest ECHO  Antimicrobials: Anti-infectives    Start     Dose/Rate Route Frequency Ordered Stop   01/28/17 1600  nafcillin 2 g in dextrose 5 % 100 mL IVPB     2 g 200 mL/hr over 30 Minutes Intravenous Every 4 hours 01/28/17 1445     01/28/17 1445  nafcillin injection 2 g  Status:  Discontinued     2 g Intravenous Every 4 hours 01/28/17 1435 01/28/17 1444   01/27/17 2200  ceFAZolin (ANCEF) IVPB 2g/100 mL premix  Status:  Discontinued     2 g 200 mL/hr over 30 Minutes Intravenous Every 8 hours 01/27/17 1702 01/28/17 1435   01/27/17 1400  vancomycin (VANCOCIN) IVPB 1000 mg/200 mL premix     1,000 mg 200  mL/hr over 60 Minutes Intravenous  Once 01/27/17 1348 01/27/17 1606      Subjective: Less nausea. Eager to start eating  Objective: Vitals:   02/03/17 1438 02/03/17 2224 02/04/17 0523 02/04/17 1506  BP: 127/71 (!) 142/87 139/80 110/67  Pulse: (!) 103 (!) 116 94 (!) 103  Resp: 20 (!) 22 20 (!) 21  Temp: 98.2 F (36.8 C) 98.1 F (36.7 C) 98.6 F (37 C)   TempSrc: Oral Oral Oral   SpO2: 100% 100% 100% 99%  Weight:      Height:        Intake/Output Summary (Last 24 hours) at 02/04/17  1556 Last data filed at 02/04/17 1003  Gross per 24 hour  Intake              690 ml  Output                0 ml  Net              690 ml   Filed Weights   01/28/17 2026 01/30/17 1315 01/31/17 1325  Weight: 45.3 kg (99 lb 13.9 oz) 50.9 kg (112 lb 3.4 oz) 47.7 kg (105 lb 2.6 oz)    Examination: General exam: Awake, laying in bed, in nad Respiratory system: Normal respiratory effort, no wheezing Cardiovascular system: regular rate, s1, s2 Gastrointestinal system: Soft, nondistended, positive BS Central nervous system: CN2-12 grossly intact, strength intact Extremities: Perfused, no clubbing Skin: Normal skin turgor, no notable skin lesions seen Psychiatry: Mood normal // no visual hallucinations    Data Reviewed: I have personally reviewed following labs and imaging studies  CBC:  Recent Labs Lab 01/29/17 0618 01/30/17 0850  WBC 11.5* 11.7*  HGB 9.7* 10.0*  HCT 29.4* 30.8*  MCV 81.2 81.9  PLT 463* 025*   Basic Metabolic Panel:  Recent Labs Lab 01/29/17 0618 01/30/17 0850  NA 135 134*  K 3.2* 4.0  CL 104 104  CO2 25 23  GLUCOSE 112* 104*  BUN 8 8  CREATININE 0.43* 0.46  CALCIUM 7.6* 7.8*  MG 2.0 1.8   GFR: Estimated Creatinine Clearance: 78.1 mL/min (by C-G formula based on SCr of 0.46 mg/dL). Liver Function Tests: No results for input(s): AST, ALT, ALKPHOS, BILITOT, PROT, ALBUMIN in the last 168 hours. No results for input(s): LIPASE, AMYLASE in the last 168 hours. No results for input(s): AMMONIA in the last 168 hours. Coagulation Profile: No results for input(s): INR, PROTIME in the last 168 hours. Cardiac Enzymes: No results for input(s): CKTOTAL, CKMB, CKMBINDEX, TROPONINI in the last 168 hours. BNP (last 3 results) No results for input(s): PROBNP in the last 8760 hours. HbA1C: No results for input(s): HGBA1C in the last 72 hours. CBG: No results for input(s): GLUCAP in the last 168 hours. Lipid Profile: No results for input(s): CHOL, HDL,  LDLCALC, TRIG, CHOLHDL, LDLDIRECT in the last 72 hours. Thyroid Function Tests: No results for input(s): TSH, T4TOTAL, FREET4, T3FREE, THYROIDAB in the last 72 hours. Anemia Panel: No results for input(s): VITAMINB12, FOLATE, FERRITIN, TIBC, IRON, RETICCTPCT in the last 72 hours. Sepsis Labs: No results for input(s): PROCALCITON, LATICACIDVEN in the last 168 hours.  Recent Results (from the past 240 hour(s))  Blood culture (routine x 2)     Status: Abnormal   Collection Time: 01/27/17  2:10 PM  Result Value Ref Range Status   Specimen Description BLOOD RIGHT ANTECUBITAL  Final   Special Requests   Final    BOTTLES DRAWN AEROBIC  AND ANAEROBIC Blood Culture adequate volume   Culture  Setup Time   Final    GRAM POSITIVE COCCI IN BOTH AEROBIC AND ANAEROBIC BOTTLES CRITICAL RESULT CALLED TO, READ BACK BY AND VERIFIED WITH: PHARMD E JACKSON [id number] MLM    Culture (A)  Final    STAPHYLOCOCCUS AUREUS SUSCEPTIBILITIES PERFORMED ON PREVIOUS CULTURE WITHIN THE LAST 5 DAYS. Performed at [facility], [place] [address] [place], [place] [address]    Report Status 01/30/2017 FINAL  Final  Blood culture (routine x 2)     Status: Abnormal   Collection Time: 01/27/17  2:32 PM  Result Value Ref Range Status   Specimen Description BLOOD LEFT HAND  Final   Special Requests   Final    BOTTLES DRAWN AEROBIC AND ANAEROBIC Blood Culture adequate volume   Culture  Setup Time   Final    GRAM POSITIVE COCCI IN BOTH AEROBIC AND ANAEROBIC BOTTLES CRITICAL RESULT CALLED TO, READ BACK BY AND VERIFIED WITH: Irwin Brakeman AT 2671 01/29/17 BY L BENFIELD    Culture (A)  Final    STAPHYLOCOCCUS AUREUS SUSCEPTIBILITIES PERFORMED ON PREVIOUS CULTURE WITHIN THE LAST 5 DAYS. VIRIDANS STREPTOCOCCUS THE SIGNIFICANCE OF ISOLATING THIS ORGANISM FROM A SINGLE SET OF BLOOD CULTURES WHEN MULTIPLE SETS ARE DRAWN IS UNCERTAIN. PLEASE NOTIFY THE MICROBIOLOGY DEPARTMENT WITHIN ONE WEEK IF SPECIATION AND  SENSITIVITIES ARE REQUIRED. Performed at [facility], [place] [address], [place], [place] [address]    Report Status 01/31/2017 FINAL  Final  Culture, blood (single) w Reflex to ID Panel     Status: None   Collection Time: 01/28/17  8:56 AM  Result Value Ref Range Status   Specimen Description BLOOD RIGHT HAND  Final   Special Requests IN PEDIATRIC BOTTLE Blood Culture adequate volume  Final   Culture   Final    NO GROWTH 5 DAYS Performed at [facility], [place] [address] [place], [place] [address]    Report Status 02/02/2017 FINAL  Final  Culture, blood (single) w Reflex to ID Panel     Status: None   Collection Time: 01/29/17  8:17 AM  Result Value Ref Range Status   Specimen Description BLOOD LEFT HAND  Final   Special Requests IN PEDIATRIC BOTTLE Blood Culture adequate volume  Final   Culture   Final    NO GROWTH 5 DAYS Performed at [facility], [place] [address], [place], [place] [address]    Report Status 02/03/2017 FINAL  Final  Culture, blood (single) w Reflex to ID Panel     Status: None   Collection Time: 01/30/17  8:50 AM  Result Value Ref Range Status   Specimen Description BLOOD BLOOD LEFT HAND  Final   Special Requests IN PEDIATRIC BOTTLE Blood Culture adequate volume  Final   Culture   Final    NO GROWTH 5 DAYS Performed at [facility], [place] [address], [place], [place] [address]    Report Status 02/04/2017 FINAL  Final     Radiology Studies: Dg Abd Portable 1v  Result Date: 02/03/2017 CLINICAL DATA:  Nausea.  Vomiting.  Constipation. EXAM: PORTABLE ABDOMEN - 1 VIEW COMPARISON:  Abdominal ultrasound of 01/31/2017. FINDINGS: Two supine views. No dilated bowel loops. No abnormal abdominal calcifications. No appendicolith. Right hemidiaphragm elevation. Suspect residual right lower lobe airspace disease. IMPRESSION: No acute findings. Electronically Signed   By: Abigail Miyamoto M.D.   On: 02/03/2017 11:18    Scheduled  Meds: .  buprenorphine-naloxone  2 tablet Sublingual BID  . enoxaparin (LOVENOX) injection  40 mg Subcutaneous Q24H  . FLUoxetine  10 mg Oral Daily  . metoCLOPramide (REGLAN) injection  5 mg Intravenous Q6H  . nicotine  14 mg Transdermal Daily   Continuous Infusions: . sodium chloride 10 mL/hr at 02/02/17 2107  . nafcillin IV Stopped (02/04/17 1230)     LOS: 8 days   Leen Tworek, Orpah Melter, MD [facility] Pager [phone]  If 7PM-7AM, please contact night-coverage [email or web address] Password [email or web address] 02/04/2017, 3:56 PM

## 2017-02-04 NOTE — Progress Notes (Signed)
PHARMACY CONSULT NOTE FOR:  OUTPATIENT  PARENTERAL ANTIBIOTIC THERAPY (OPAT)  Indication: Endocarditis, Tricuspid Valve MSSA, Viridans strep Regimen: Nafcillin 2 gm IV q4h End date: 02/18/2017  IV antibiotic discharge orders are pended. To discharging provider:  please sign these orders via discharge navigator,  Select New Orders & click on the button choice - Manage This Unsigned Work.     Thank you for allowing pharmacy to be a part of this patient's care.  Eudelia Bunch, Pharm.D. [phone] 02/04/2017 4:27 PM

## 2017-02-05 MED ORDER — PROMETHAZINE HCL 25 MG RE SUPP
25.0000 mg | Freq: Four times a day (QID) | RECTAL | Status: DC | PRN
  Administered 2017-02-05 – 2017-02-08 (×2): 25 mg via RECTAL
  Filled 2017-02-05 (×2): qty 1

## 2017-02-05 MED ORDER — METOCLOPRAMIDE HCL 5 MG/ML IJ SOLN
10.0000 mg | Freq: Four times a day (QID) | INTRAMUSCULAR | Status: DC
  Administered 2017-02-05 – 2017-02-06 (×3): 10 mg via INTRAVENOUS
  Filled 2017-02-05 (×4): qty 2

## 2017-02-05 MED ORDER — BUPRENORPHINE HCL-NALOXONE HCL 8-2 MG SL SUBL
1.0000 | SUBLINGUAL_TABLET | Freq: Every evening | SUBLINGUAL | Status: DC
  Administered 2017-02-05 – 2017-02-08 (×4): 1 via SUBLINGUAL
  Filled 2017-02-05 (×5): qty 1

## 2017-02-05 MED ORDER — BUPRENORPHINE HCL-NALOXONE HCL 8-2 MG SL SUBL
2.0000 | SUBLINGUAL_TABLET | Freq: Every morning | SUBLINGUAL | Status: DC
  Administered 2017-02-06 – 2017-02-09 (×4): 2 via SUBLINGUAL
  Filled 2017-02-05 (×4): qty 2

## 2017-02-05 NOTE — Progress Notes (Signed)
Nutrition Brief Note  Patient identified as extended length of stay  Wt Readings from Last 15 Encounters:  01/31/17 105 lb 2.6 oz (47.7 kg)  01/24/17 98 lb 3.2 oz (44.5 kg)  01/13/16 100 lb (45.4 kg)    Body mass index is 19.87 kg/m. Patient meets criteria for normal based on current BMI.   Current diet order is regular, patient is consuming approximately 85% of meals at this time. Labs and medications reviewed.   No nutrition interventions warranted at this time. If nutrition issues arise, please consult RD.   Mariana Single RD, LDN Clinical Nutrition Pager # [phone]

## 2017-02-05 NOTE — Progress Notes (Signed)
PROGRESS NOTE    Sherri Francis  GGY:[id number] DOB: April 03, 1986 DOA: 01/27/2017 PCP: Patient, No Pcp Per    Brief Narrative:  31 y.o.femalewith history of IVDA, hepatitis C, alcohol use, tobacco abuse, depression/anxiety with suicide attempts who presented to the ER on 8/19 with about a week of fever and cough. She was found to have bilateral pneumonia and was given vancomycin and ceftriaxone. She was going to be admitted to the hospital, but she left against medical advice because her withdrawal symptoms were severe. She was called back on the same date because 2/2 blood cultures were positive for MSSA and advised to come back to the hospital. She returned to the ER on 8/22. She has septic pulmonary embolic with a 1.5 x 0.3JK tricuspid valve vegetation with mild regurgitation.   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 No acute findings on imaging. - Patient is continued on reglan  IVDA with heroin withdrawal -  Patient had been continued on suboxone 32 mg daily - SW consulted - UDS positive for opiates -  HIV NR - Consulted Psychiatry with  Recommendations on taper by 4mg  every 5 days. - Will decrease to 24mg  daily (25% decrease)  Reported distant ETOH  use - ETOH level noted to be negative - without evidence of ETOH withdrawals  Hypokalemia - Recently corrected - Continue to monitor  Iron deficiency vs. Anemia of chronic disease -  had stopped iron supplementation due to nausea and vomiting - Remains stable at present  Hepatitis C  - LFTs wnl -  hep C log10 5.748/ 560,000 IU/mL, genotype 1a - Will need close follow-up with hepatology and/or infectious disease after discharge  Hypokalemia, mild - Corrected - Remains stable at this time  Depression/anxiety -  Started on 8/26 prozac -  Gabapentin prn anxiety -   avoid benzodiazepines -  Remains stable  DVT prophylaxis: Lovenox subcutaneous  Code Status: Full code  Family Communication: Patient in room, family not at bedside  Disposition Plan: Uncertain at this time, discharge planning in progress  Consultants:  Infectious disease  Psychiatry   Procedures:  CT chest ECHO  Antimicrobials: Anti-infectives    Start     Dose/Rate Route Frequency Ordered Stop   01/28/17 1600  nafcillin 2 g in dextrose 5 % 100 mL IVPB     2 g 200 mL/hr over 30 Minutes Intravenous Every 4 hours 01/28/17 1445     01/28/17 1445  nafcillin injection 2 g  Status:  Discontinued     2 g Intravenous Every 4 hours 01/28/17 1435 01/28/17 1444   01/27/17 2200  ceFAZolin (ANCEF) IVPB 2g/100 mL premix  Status:  Discontinued     2 g 200 mL/hr over 30 Minutes Intravenous Every 8 hours 01/27/17 1702  01/28/17 1435   01/27/17 1400  vancomycin (VANCOCIN) IVPB 1000 mg/200 mL premix     1,000 mg 200 mL/hr over 60 Minutes Intravenous  Once 01/27/17 1348 01/27/17 1606      Subjective: Complains of continued nausea  Objective: Vitals:   02/04/17 2145 02/05/17 0614 02/05/17 0810 02/05/17 1400  BP: 115/71 116/72 (!) 164/80 (!) 149/90  Pulse: 85  78 92  Resp: 16 16 16 16   Temp: 98.5 F (36.9 C) 98.5 F (36.9 C) 98.3 F (36.8 C) 98.7 F (37.1 C)  TempSrc: Oral Oral Oral   SpO2: 97% 98% 99%  99%  Weight:      Height:        Intake/Output Summary (Last 24 hours) at 02/05/17 1702 Last data filed at 02/05/17 9509  Gross per 24 hour  Intake              880 ml  Output                0 ml  Net              880 ml   Filed Weights   01/28/17 2026 01/30/17 1315 01/31/17 1325  Weight: 45.3 kg (99 lb 13.9 oz) 50.9 kg (112 lb 3.4 oz) 47.7 kg (105 lb 2.6 oz)    Examination: General exam: Conversant, in no acute distress Respiratory system: normal chest rise, clear, no audible wheezing Cardiovascular system: regular rhythm, s1-s2 Gastrointestinal system: Nondistended, nontender, pos BS Central nervous system: No seizures, no tremors Extremities: No cyanosis, no joint deformities Skin: No rashes, no pallor Psychiatry: Affect normal // no auditory hallucinations    Data Reviewed: I have personally reviewed following labs and imaging studies  CBC:  Recent Labs Lab 01/30/17 0850  WBC 11.7*  HGB 10.0*  HCT 30.8*  MCV 81.9  PLT 326*   Basic Metabolic Panel:  Recent Labs Lab 01/30/17 0850  NA 134*  K 4.0  CL 104  CO2 23  GLUCOSE 104*  BUN 8  CREATININE 0.46  CALCIUM 7.8*  MG 1.8   GFR: Estimated Creatinine Clearance: 78.1 mL/min (by C-G formula based on SCr of 0.46 mg/dL). Liver Function Tests: No results for input(s): AST, ALT, ALKPHOS, BILITOT, PROT, ALBUMIN in the last 168 hours. No results for input(s): LIPASE, AMYLASE in the last 168 hours. No results for input(s): AMMONIA in the last 168 hours. Coagulation Profile: No results for input(s): INR, PROTIME in the last 168 hours. Cardiac Enzymes: No results for input(s): CKTOTAL, CKMB, CKMBINDEX, TROPONINI in the last 168 hours. BNP (last 3 results) No results for input(s): PROBNP in the last 8760 hours. HbA1C: No results for input(s): HGBA1C in the last 72 hours. CBG: No results for input(s): GLUCAP in the last 168 hours. Lipid Profile: No results for input(s): CHOL, HDL, LDLCALC, TRIG, CHOLHDL,  LDLDIRECT in the last 72 hours. Thyroid Function Tests: No results for input(s): TSH, T4TOTAL, FREET4, T3FREE, THYROIDAB in the last 72 hours. Anemia Panel: No results for input(s): VITAMINB12, FOLATE, FERRITIN, TIBC, IRON, RETICCTPCT in the last 72 hours. Sepsis Labs: No results for input(s): PROCALCITON, LATICACIDVEN in the last 168 hours.  Recent Results (from the past 240 hour(s))  Blood culture (routine x 2)     Status: Abnormal   Collection Time: 01/27/17  2:10 PM  Result Value Ref Range Status   Specimen Description BLOOD RIGHT ANTECUBITAL  Final   Special Requests   Final    BOTTLES DRAWN AEROBIC AND ANAEROBIC Blood  Culture adequate volume   Culture  Setup Time   Final    GRAM POSITIVE COCCI IN BOTH AEROBIC AND ANAEROBIC BOTTLES CRITICAL RESULT CALLED TO, READ BACK BY AND VERIFIED WITH: PHARMD E JACKSON [id number] [phone]    Culture (A)  Final    STAPHYLOCOCCUS AUREUS SUSCEPTIBILITIES PERFORMED ON PREVIOUS CULTURE WITHIN THE LAST 5 DAYS. Performed at [facility], [place] [address], [place], [place] [address]    Report Status 01/30/2017 FINAL  Final  Blood culture (routine x 2)     Status: Abnormal   Collection Time: 01/27/17  2:32 PM  Result Value Ref Range Status   Specimen Description BLOOD LEFT HAND  Final   Special Requests   Final    BOTTLES DRAWN AEROBIC AND ANAEROBIC Blood Culture adequate volume   Culture  Setup Time   Final    GRAM POSITIVE COCCI IN BOTH AEROBIC AND ANAEROBIC BOTTLES CRITICAL RESULT CALLED TO, READ BACK BY AND VERIFIED WITH: Irwin Brakeman AT 5188 01/29/17 BY L BENFIELD    Culture (A)  Final    STAPHYLOCOCCUS AUREUS SUSCEPTIBILITIES PERFORMED ON PREVIOUS CULTURE WITHIN THE LAST 5 DAYS. VIRIDANS STREPTOCOCCUS THE SIGNIFICANCE OF ISOLATING THIS ORGANISM FROM A SINGLE SET OF BLOOD CULTURES WHEN MULTIPLE SETS ARE DRAWN IS UNCERTAIN. PLEASE NOTIFY THE MICROBIOLOGY DEPARTMENT WITHIN ONE WEEK IF SPECIATION AND SENSITIVITIES ARE  REQUIRED. Performed at [facility], [place] [address], [place], [place] [address]    Report Status 01/31/2017 FINAL  Final  Culture, blood (single) w Reflex to ID Panel     Status: None   Collection Time: 01/28/17  8:56 AM  Result Value Ref Range Status   Specimen Description BLOOD RIGHT HAND  Final   Special Requests IN PEDIATRIC BOTTLE Blood Culture adequate volume  Final   Culture   Final    NO GROWTH 5 DAYS Performed at [facility], [place] [address] [place], [place] [address]    Report Status 02/02/2017 FINAL  Final  Culture, blood (single) w Reflex to ID Panel     Status: None   Collection Time: 01/29/17  8:17 AM  Result Value Ref Range Status   Specimen Description BLOOD LEFT HAND  Final   Special Requests IN PEDIATRIC BOTTLE Blood Culture adequate volume  Final   Culture   Final    NO GROWTH 5 DAYS Performed at [facility], [place] [address], [place], [place] [address]    Report Status 02/03/2017 FINAL  Final  Culture, blood (single) w Reflex to ID Panel     Status: None   Collection Time: 01/30/17  8:50 AM  Result Value Ref Range Status   Specimen Description BLOOD BLOOD LEFT HAND  Final   Special Requests IN PEDIATRIC BOTTLE Blood Culture adequate volume  Final   Culture   Final    NO GROWTH 5 DAYS Performed at [facility], [place] [address] [place], [place] [address]    Report Status 02/04/2017 FINAL  Final     Radiology Studies: No results found.  Scheduled Meds: . buprenorphine-naloxone  1 tablet Sublingual QPM  . [START ON 02/06/2017] buprenorphine-naloxone  2 tablet Sublingual q morning - 10a  . enoxaparin (LOVENOX) injection  40 mg Subcutaneous Q24H  . FLUoxetine  10 mg Oral Daily  . metoCLOPramide (REGLAN) injection  10 mg Intravenous Q6H  . nicotine  14 mg Transdermal Daily   Continuous Infusions: . sodium chloride 10 mL/hr at 02/02/17 2107  .  nafcillin IV 2 g (02/05/17 1648)     LOS: 9 days   Liel Rudden, Orpah Melter, MD [facility] Pager [phone]  If 7PM-7AM, please contact night-coverage [email or web address] Password [email or web address] 02/05/2017, 5:02 PM

## 2017-02-06 MED ORDER — METOCLOPRAMIDE HCL 5 MG/ML IJ SOLN
5.0000 mg | Freq: Four times a day (QID) | INTRAMUSCULAR | Status: DC | PRN
  Administered 2017-02-07 – 2017-02-12 (×8): 5 mg via INTRAVENOUS
  Filled 2017-02-06 (×10): qty 2

## 2017-02-06 NOTE — Progress Notes (Signed)
PROGRESS NOTE    Sherri Francis  HER:[id number] DOB: 01-15-1986 DOA: 01/27/2017 PCP: Patient, No Pcp Per    Brief Narrative:  31 y.o.femalewith history of IVDA, hepatitis C, alcohol use, tobacco abuse, depression/anxiety with suicide attempts who presented to the ER on 8/19 with about a week of fever and cough. She was found to have bilateral pneumonia and was given vancomycin and ceftriaxone. She was going to be admitted to the hospital, but she left against medical advice because her withdrawal symptoms were severe. She was called back on the same date because 2/2 blood cultures were positive for MSSA and advised to come back to the hospital. She returned to the ER on 8/22. She has septic pulmonary embolic with a 1.5 x 8.5UD tricuspid valve vegetation with mild regurgitation.   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 No acute findings on imaging. - Improved at present. Good bowel sounds. Will keep reglan as PRN only  IVDA with heroin withdrawal -  Patient had been continued on suboxone 32 mg daily - SW consulted - UDS positive for opiates -  HIV NR - Consulted Psychiatry with  Recommendations on taper by 4mg  every 5 days. - Currently on 24mg   Reported distant  ETOH use - ETOH level noted to be negative - without evidence of ETOH withdrawals - Stable at present  Hypokalemia - Recently corrected - Stable at present  Iron deficiency vs. Anemia of chronic disease -  had stopped iron supplementation due to nausea and vomiting - Currently stable  Hepatitis C  - LFTs wnl -  hep C log10 5.748/ 560,000 IU/mL, genotype 1a - Will need close follow-up with hepatology and/or infectious disease following discharge  Hypokalemia, mild - Corrected - Currently stable at this time  Depression/anxiety -  Started on 8/26 prozac -  Gabapentin prn anxiety -   avoid benzodiazepines -  Presently stable  DVT prophylaxis: Lovenox subcutaneous  Code Status: Full code  Family Communication: Patient in room, family not at bedside  Disposition Plan: Uncertain at this time, discharge planning in progress  Consultants:  Infectious disease  Psychiatry   Procedures:  CT chest ECHO  Antimicrobials: Anti-infectives    Start     Dose/Rate Route Frequency Ordered Stop   01/28/17 1600  nafcillin 2 g in dextrose 5 % 100 mL IVPB     2 g 200 mL/hr over 30 Minutes Intravenous Every 4 hours 01/28/17 1445     01/28/17 1445  nafcillin injection 2 g  Status:  Discontinued     2 g Intravenous Every 4 hours 01/28/17 1435 01/28/17 1444   01/27/17 2200  ceFAZolin (ANCEF) IVPB 2g/100 mL premix  Status:  Discontinued     2 g 200 mL/hr over 30 Minutes Intravenous Every  8 hours 01/27/17 1702 01/28/17 1435   01/27/17 1400  vancomycin (VANCOCIN) IVPB 1000 mg/200 mL premix     1,000 mg 200 mL/hr over 60 Minutes Intravenous  Once 01/27/17 1348 01/27/17 1606      Subjective: This AM, reported nausea. When seen, pt states nausea improved  Objective: Vitals:   02/05/17 1400 02/05/17 1959 02/06/17 0552 02/06/17 1401  BP: (!) 149/90 (!) 148/89 110/67 124/77  Pulse: 92 83 86 93  Resp: 16 18 19 18   Temp: 98.7 F (37.1 C) 98.5 F (36.9 C) 98.3 F (36.8 C) 98.6 F  (37 C)  TempSrc:  Oral Oral Oral  SpO2: 99% 98% 98% 99%  Weight:    45.8 kg (100 lb 15.5 oz)  Height:        Intake/Output Summary (Last 24 hours) at 02/06/17 1426 Last data filed at 02/06/17 1402  Gross per 24 hour  Intake             1218 ml  Output                0 ml  Net             1218 ml   Filed Weights   01/30/17 1315 01/31/17 1325 02/06/17 1401  Weight: 50.9 kg (112 lb 3.4 oz) 47.7 kg (105 lb 2.6 oz) 45.8 kg (100 lb 15.5 oz)    Examination: General exam: Awake, laying in bed, in nad Respiratory system: Normal respiratory effort, no wheezing Cardiovascular system: regular rate, s1, s2 Gastrointestinal system: Soft, nondistended, positive BS Central nervous system: CN2-12 grossly intact, strength intact Extremities: Perfused, no clubbing Skin: Normal skin turgor, no notable skin lesions seen, osler note present Psychiatry: Mood normal // no visual hallucinations    Data Reviewed: I have personally reviewed following labs and imaging studies  CBC: No results for input(s): WBC, NEUTROABS, HGB, HCT, MCV, PLT in the last 168 hours. Basic Metabolic Panel: No results for input(s): NA, K, CL, CO2, GLUCOSE, BUN, CREATININE, CALCIUM, MG, PHOS in the last 168 hours. GFR: Estimated Creatinine Clearance: 75 mL/min (by C-G formula based on SCr of 0.46 mg/dL). Liver Function Tests: No results for input(s): AST, ALT, ALKPHOS, BILITOT, PROT, ALBUMIN in the last 168 hours. No results for input(s): LIPASE, AMYLASE in the last 168 hours. No results for input(s): AMMONIA in the last 168 hours. Coagulation Profile: No results for input(s): INR, PROTIME in the last 168 hours. Cardiac Enzymes: No results for input(s): CKTOTAL, CKMB, CKMBINDEX, TROPONINI in the last 168 hours. BNP (last 3 results) No results for input(s): PROBNP in the last 8760 hours. HbA1C: No results for input(s): HGBA1C in the last 72 hours. CBG: No results for input(s): GLUCAP in the last 168 hours. Lipid  Profile: No results for input(s): CHOL, HDL, LDLCALC, TRIG, CHOLHDL, LDLDIRECT in the last 72 hours. Thyroid Function Tests: No results for input(s): TSH, T4TOTAL, FREET4, T3FREE, THYROIDAB in the last 72 hours. Anemia Panel: No results for input(s): VITAMINB12, FOLATE, FERRITIN, TIBC, IRON, RETICCTPCT in the last 72 hours. Sepsis Labs: No results for input(s): PROCALCITON, LATICACIDVEN in the last 168 hours.  Recent Results (from the past 240 hour(s))  Blood culture (routine x 2)     Status: Abnormal   Collection Time: 01/27/17  2:32 PM  Result Value Ref Range Status   Specimen Description BLOOD LEFT HAND  Final   Special Requests   Final    BOTTLES DRAWN AEROBIC AND ANAEROBIC Blood Culture adequate volume  Culture  Setup Time   Final    GRAM POSITIVE COCCI IN BOTH AEROBIC AND ANAEROBIC BOTTLES CRITICAL RESULT CALLED TO, READ BACK BY AND VERIFIED WITH: Irwin Brakeman AT 1749 01/29/17 BY L BENFIELD    Culture (A)  Final    STAPHYLOCOCCUS AUREUS SUSCEPTIBILITIES PERFORMED ON PREVIOUS CULTURE WITHIN THE LAST 5 DAYS. VIRIDANS STREPTOCOCCUS THE SIGNIFICANCE OF ISOLATING THIS ORGANISM FROM A SINGLE SET OF BLOOD CULTURES WHEN MULTIPLE SETS ARE DRAWN IS UNCERTAIN. PLEASE NOTIFY THE MICROBIOLOGY DEPARTMENT WITHIN ONE WEEK IF SPECIATION AND SENSITIVITIES ARE REQUIRED. Performed at [facility], [place] [address] [place], [place] [address]    Report Status 01/31/2017 FINAL  Final  Culture, blood (single) w Reflex to ID Panel     Status: None   Collection Time: 01/28/17  8:56 AM  Result Value Ref Range Status   Specimen Description BLOOD RIGHT HAND  Final   Special Requests IN PEDIATRIC BOTTLE Blood Culture adequate volume  Final   Culture   Final    NO GROWTH 5 DAYS Performed at [facility], [place] [address], [place], [place] [address]    Report Status 02/02/2017 FINAL  Final  Culture, blood (single) w Reflex to ID Panel     Status: None   Collection Time: 01/29/17  8:17 AM   Result Value Ref Range Status   Specimen Description BLOOD LEFT HAND  Final   Special Requests IN PEDIATRIC BOTTLE Blood Culture adequate volume  Final   Culture   Final    NO GROWTH 5 DAYS Performed at [facility], [place] [address] [place], [place] [address]    Report Status 02/03/2017 FINAL  Final  Culture, blood (single) w Reflex to ID Panel     Status: None   Collection Time: 01/30/17  8:50 AM  Result Value Ref Range Status   Specimen Description BLOOD BLOOD LEFT HAND  Final   Special Requests IN PEDIATRIC BOTTLE Blood Culture adequate volume  Final   Culture   Final    NO GROWTH 5 DAYS Performed at [facility], [place] [address], [place], [place] [address]    Report Status 02/04/2017 FINAL  Final     Radiology Studies: No results found.  Scheduled Meds: . buprenorphine-naloxone  1 tablet Sublingual QPM  . buprenorphine-naloxone  2 tablet Sublingual q morning - 10a  . enoxaparin (LOVENOX) injection  40 mg Subcutaneous Q24H  . FLUoxetine  10 mg Oral Daily  . nicotine  14 mg Transdermal Daily   Continuous Infusions: . sodium chloride 10 mL/hr at 02/02/17 2107  . nafcillin IV Stopped (02/06/17 1346)     LOS: 10 days   Georgia Baria, Orpah Melter, MD [facility] Pager [phone]  If 7PM-7AM, please contact night-coverage [email or web address] Password [email or web address] 02/06/2017, 2:26 PM

## 2017-02-07 ENCOUNTER — Inpatient Hospital Stay (HOSPITAL_COMMUNITY): Payer: Self-pay

## 2017-02-07 LAB — RAPID URINE DRUG SCREEN, HOSP PERFORMED
Amphetamines: NOT DETECTED
BARBITURATES: NOT DETECTED
Benzodiazepines: NOT DETECTED
COCAINE: NOT DETECTED
Opiates: NOT DETECTED
TETRAHYDROCANNABINOL: NOT DETECTED

## 2017-02-07 MED ORDER — IOPAMIDOL (ISOVUE-300) INJECTION 61%
INTRAVENOUS | Status: AC
  Administered 2017-02-07: 75 mL via INTRAVENOUS
  Filled 2017-02-07: qty 75

## 2017-02-07 MED ORDER — IOPAMIDOL (ISOVUE-300) INJECTION 61%
INTRAVENOUS | Status: AC
  Filled 2017-02-07: qty 30

## 2017-02-07 NOTE — Progress Notes (Signed)
PROGRESS NOTE    Sherri Francis  RFF:[id number] DOB: 20-Jun-1985 DOA: 01/27/2017 PCP: Patient, No Pcp Per    Brief Narrative:  31 y.o.femalewith history of IVDA, hepatitis C, alcohol use, tobacco abuse, depression/anxiety with suicide attempts who presented to the ER on 8/19 with about a week of fever and cough. She was found to have bilateral pneumonia and was given vancomycin and ceftriaxone. She was going to be admitted to the hospital, but she left against medical advice because her withdrawal symptoms were severe. She was called back on the same date because 2/2 blood cultures were positive for MSSA and advised to come back to the hospital. She returned to the ER on 8/22. She has septic pulmonary embolic with a 1.5 x 3.5TS tricuspid valve vegetation with mild regurgitation.   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 No acute findings on imaging. - abd soft, pos BS. Given continued symptoms, will check CT abd/pelvis  IVDA with heroin withdrawal -  Patient had been continued on suboxone 32 mg daily - SW consulted - UDS positive for opiates -  HIV NR - Consulted Psychiatry with  Recommendations on taper by 4mg  every 5 days. - Presently on  24mg   Reported distant ETOH use - ETOH level noted to be negative - without evidence of ETOH withdrawals - Remains stable  Hypokalemia - Recently corrected - Stable at present  Iron deficiency vs. Anemia of chronic disease -  had stopped iron supplementation due to nausea and vomiting - Currently stable  Hepatitis C  - LFTs wnl -  hep C log10 5.748/ 560,000 IU/mL, genotype 1a - Will need close follow-up with hepatology and/or infectious disease when patient is discharged  Hypokalemia, mild - Corrected - remains stable at this time  Depression/anxiety -  Started on 8/26 prozac -  Gabapentin prn anxiety -   avoid benzodiazepines -  Currently stable  DVT prophylaxis: Lovenox subcutaneous  Code Status: Full code  Family Communication: Patient in room, family not at bedside  Disposition Plan: Uncertain at this time, discharge planning in progress  Consultants:  Infectious disease  Psychiatry   Procedures:  CT chest ECHO  Antimicrobials: Anti-infectives    Start     Dose/Rate Route Frequency Ordered Stop   01/28/17 1600  nafcillin 2 g in dextrose 5 % 100 mL IVPB     2 g 200 mL/hr over 30 Minutes Intravenous Every 4 hours 01/28/17 1445     01/28/17 1445  nafcillin injection 2 g  Status:  Discontinued     2 g Intravenous Every 4 hours 01/28/17 1435 01/28/17 1444   01/27/17 2200  ceFAZolin (ANCEF) IVPB 2g/100 mL premix  Status:  Discontinued     2 g 200 mL/hr over  30 Minutes Intravenous Every 8 hours 01/27/17 1702 01/28/17 1435   01/27/17 1400  vancomycin (VANCOCIN) IVPB 1000 mg/200 mL premix     1,000 mg 200 mL/hr over 60 Minutes Intravenous  Once 01/27/17 1348 01/27/17 1606      Subjective: Patient continues with nausea, reports improvement with hot shower  Objective: Vitals:   02/06/17 0552 02/06/17 1401 02/06/17 2047 02/07/17 0530  BP: 110/67 124/77 120/65 117/60  Pulse: 86 93 86 95  Resp: 19 18 18 18   Temp: 98.3 F (36.8 C) 98.6 F (37 C)  98.4 F (36.9 C) 98.3 F (36.8 C)  TempSrc: Oral Oral Oral Oral  SpO2: 98% 99% 99% 98%  Weight:  45.8 kg (100 lb 15.5 oz)    Height:        Intake/Output Summary (Last 24 hours) at 02/07/17 1321 Last data filed at 02/07/17 0836  Gross per 24 hour  Intake              600 ml  Output                0 ml  Net              600 ml   Filed Weights   01/30/17 1315 01/31/17 1325 02/06/17 1401  Weight: 50.9 kg (112 lb 3.4 oz) 47.7 kg (105 lb 2.6 oz) 45.8 kg (100 lb 15.5 oz)    Examination: General exam: Conversant, in no acute distress Respiratory system: normal chest rise, clear, no audible wheezing Cardiovascular system: regular rhythm, s1-s2 Gastrointestinal system: Nondistended, nontender, pos BS Central nervous system: No seizures, no tremors Extremities: No cyanosis, no joint deformities Skin: No rashes, no pallor Psychiatry: Affect normal // no auditory hallucinations    Data Reviewed: I have personally reviewed following labs and imaging studies  CBC: No results for input(s): WBC, NEUTROABS, HGB, HCT, MCV, PLT in the last 168 hours. Basic Metabolic Panel: No results for input(s): NA, K, CL, CO2, GLUCOSE, BUN, CREATININE, CALCIUM, MG, PHOS in the last 168 hours. GFR: Estimated Creatinine Clearance: 75 mL/min (by C-G formula based on SCr of 0.46 mg/dL). Liver Function Tests: No results for input(s): AST, ALT, ALKPHOS, BILITOT, PROT, ALBUMIN in the last 168 hours. No results for input(s): LIPASE, AMYLASE in the last 168 hours. No results for input(s): AMMONIA in the last 168 hours. Coagulation Profile: No results for input(s): INR, PROTIME in the last 168 hours. Cardiac Enzymes: No results for input(s): CKTOTAL, CKMB, CKMBINDEX, TROPONINI in the last 168 hours. BNP (last 3 results) No results for input(s): PROBNP in the last 8760 hours. HbA1C: No results for input(s): HGBA1C in the last 72 hours. CBG: No results for input(s): GLUCAP in the last 168 hours. Lipid  Profile: No results for input(s): CHOL, HDL, LDLCALC, TRIG, CHOLHDL, LDLDIRECT in the last 72 hours. Thyroid Function Tests: No results for input(s): TSH, T4TOTAL, FREET4, T3FREE, THYROIDAB in the last 72 hours. Anemia Panel: No results for input(s): VITAMINB12, FOLATE, FERRITIN, TIBC, IRON, RETICCTPCT in the last 72 hours. Sepsis Labs: No results for input(s): PROCALCITON, LATICACIDVEN in the last 168 hours.  Recent Results (from the past 240 hour(s))  Culture, blood (single) w Reflex to ID Panel     Status: None   Collection Time: 01/29/17  8:17 AM  Result Value Ref Range Status   Specimen Description BLOOD LEFT HAND  Final   Special Requests IN PEDIATRIC BOTTLE Blood Culture adequate volume  Final   Culture   Final  NO GROWTH 5 DAYS Performed at [facility], [place] [address], [place], [place] [address]    Report Status 02/03/2017 FINAL  Final  Culture, blood (single) w Reflex to ID Panel     Status: None   Collection Time: 01/30/17  8:50 AM  Result Value Ref Range Status   Specimen Description BLOOD BLOOD LEFT HAND  Final   Special Requests IN PEDIATRIC BOTTLE Blood Culture adequate volume  Final   Culture   Final    NO GROWTH 5 DAYS Performed at [facility], [place] [address] [place], [place] [address]    Report Status 02/04/2017 FINAL  Final     Radiology Studies: No results found.  Scheduled Meds: . iopamidol      . buprenorphine-naloxone  1 tablet Sublingual QPM  . buprenorphine-naloxone  2 tablet Sublingual q morning - 10a  . enoxaparin (LOVENOX) injection  40 mg Subcutaneous Q24H  . FLUoxetine  10 mg Oral Daily  . nicotine  14 mg Transdermal Daily   Continuous Infusions: . sodium chloride 10 mL/hr at 02/02/17 2107  . nafcillin IV Stopped (02/07/17 1229)     LOS: 11 days   Cheyna Retana, Orpah Melter, MD [facility] Pager [phone]  If 7PM-7AM, please contact night-coverage [email or web address] Password [email or web address] 02/07/2017, 1:21 PM

## 2017-02-08 LAB — COMPREHENSIVE METABOLIC PANEL
ALT: 14 U/L (ref 14–54)
AST: 15 U/L (ref 15–41)
Albumin: 2.7 g/dL — ABNORMAL LOW (ref 3.5–5.0)
Alkaline Phosphatase: 55 U/L (ref 38–126)
Anion gap: 8 (ref 5–15)
BUN: 8 mg/dL (ref 6–20)
CHLORIDE: 101 mmol/L (ref 101–111)
CO2: 28 mmol/L (ref 22–32)
CREATININE: 0.45 mg/dL (ref 0.44–1.00)
Calcium: 8.7 mg/dL — ABNORMAL LOW (ref 8.9–10.3)
GFR calc non Af Amer: >60 mL/min (ref >60)
Glucose, Bld: 103 mg/dL — ABNORMAL HIGH (ref 65–99)
POTASSIUM: 4 mmol/L (ref 3.5–5.1)
SODIUM: 137 mmol/L (ref 135–145)
Total Bilirubin: 0.4 mg/dL (ref 0.3–1.2)
Total Protein: 7.3 g/dL (ref 6.5–8.1)

## 2017-02-08 LAB — CBC
HEMATOCRIT: 34.2 % — AB (ref 36.0–46.0)
Hemoglobin: 10.9 g/dL — ABNORMAL LOW (ref 12.0–15.0)
MCH: 26.8 pg (ref 26.0–34.0)
MCHC: 31.9 g/dL (ref 30.0–36.0)
MCV: 84 fL (ref 78.0–100.0)
PLATELETS: 844 10*3/uL — AB (ref 150–400)
RBC: 4.07 MIL/uL (ref 3.87–5.11)
RDW: 15.9 % — ABNORMAL HIGH (ref 11.5–15.5)
WBC: 10.9 10*3/uL — AB (ref 4.0–10.5)

## 2017-02-08 MED ORDER — RISAQUAD PO CAPS
1.0000 | ORAL_CAPSULE | Freq: Every day | ORAL | Status: DC
  Administered 2017-02-08 – 2017-02-10 (×2): 1 via ORAL
  Filled 2017-02-08 (×5): qty 1

## 2017-02-08 MED ORDER — DEXTROSE 5 % IV SOLN
100.0000 mg | Freq: Two times a day (BID) | INTRAVENOUS | Status: DC
  Administered 2017-02-08 – 2017-02-13 (×9): 100 mg via INTRAVENOUS
  Filled 2017-02-08 (×11): qty 100

## 2017-02-08 MED ORDER — PROMETHAZINE HCL 25 MG/ML IJ SOLN
12.5000 mg | Freq: Four times a day (QID) | INTRAMUSCULAR | Status: DC | PRN
  Administered 2017-02-08 – 2017-02-13 (×10): 25 mg via INTRAVENOUS
  Filled 2017-02-08 (×11): qty 1

## 2017-02-08 MED ORDER — DOXYCYCLINE HYCLATE 100 MG PO TABS: 100.0000 mg | ORAL_TABLET | Freq: Two times a day (BID) | ORAL | Status: DC

## 2017-02-08 NOTE — Progress Notes (Signed)
PROGRESS NOTE    Sherri Francis  XAJ:[id number] DOB: 1986-06-08 DOA: 01/27/2017 PCP: Patient, No Pcp Per    Brief Narrative:  31 y.o.femalewith history of IVDA, hepatitis C, alcohol use, tobacco abuse, depression/anxiety with suicide attempts who presented to the ER on 8/19 with about a week of fever and cough. She was found to have bilateral pneumonia and was given vancomycin and ceftriaxone. She was going to be admitted to the hospital, but she left against medical advice because her withdrawal symptoms were severe. She was called back on the same date because 2/2 blood cultures were positive for MSSA and advised to come back to the hospital. She returned to the ER on 8/22. She has septic pulmonary embolic with a 1.5 x 0.9OB tricuspid valve vegetation with mild regurgitation.   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 No acute findings on imaging. - abd soft, pos BS. Given continued symptoms, ordered CT abd/pelvis. Reviewed. 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 Discussed case with on call GYN who recommends addition of doxycycline 100mg  bid to treat PID   IVDA with heroin  withdrawal -  Patient had been continued on suboxone 32 mg daily - SW consulted - UDS positive for opiates -  HIV NR - Consulted Psychiatry with  Recommendations on taper by 4mg  every 5 days. - Currently on 24mg   Reported distant ETOH use - ETOH level noted to be negative - without evidence of ETOH withdrawals - Presently stable  Hypokalemia - Recently corrected -  Currently stable, normal range  Iron deficiency vs. Anemia of chronic disease -  had stopped iron supplementation due to nausea and vomiting - remains stable  Hepatitis C  - LFTs wnl -  hep C log10 5.748/ 560,000 IU/mL, genotype 1a - Pt would benefit from close follow-up with hepatology and/or infectious disease when patient is discharged  Hypokalemia, mild - Corrected - Currently stable at this time  Depression/anxiety -  Started on 8/26 prozac -  Gabapentin prn anxiety -   avoid benzodiazepines -  Remains stable  DVT prophylaxis: Lovenox subcutaneous  Code Status: Full code  Family Communication: Patient in room, family not at bedside  Disposition Plan: Uncertain at this time, discharge planning in progress  Consultants:  Infectious disease  Psychiatry  Discussed case with on-call Gyn  Procedures:  CT chest ECHO CT abd/pelvis  Antimicrobials: Anti-infectives    Start     Dose/Rate Route Frequency Ordered Stop   02/08/17 1200  doxycycline (VIBRAMYCIN) 100 mg in dextrose 5 % 250 mL IVPB     100 mg 125 mL/hr over 120 Minutes Intravenous Every 12 hours 02/08/17 1136     02/08/17 1100  doxycycline (  VIBRA-TABS) tablet 100 mg  Status:  Discontinued     100 mg Oral Every 12 hours 02/08/17 1009 02/08/17 1136   01/28/17 1600  nafcillin 2 g in dextrose 5 % 100 mL IVPB     2 g 200 mL/hr over 30 Minutes Intravenous Every 4 hours 01/28/17 1445     01/28/17 1445  nafcillin injection 2 g  Status:  Discontinued     2 g Intravenous Every 4 hours 01/28/17 1435 01/28/17 1444   01/27/17 2200   ceFAZolin (ANCEF) IVPB 2g/100 mL premix  Status:  Discontinued     2 g 200 mL/hr over 30 Minutes Intravenous Every 8 hours 01/27/17 1702 01/28/17 1435   01/27/17 1400  vancomycin (VANCOCIN) IVPB 1000 mg/200 mL premix     1,000 mg 200 mL/hr over 60 Minutes Intravenous  Once 01/27/17 1348 01/27/17 1606      Subjective: Continues with nausea, abd discomfort  Objective: Vitals:   02/07/17 1346 02/07/17 2040 02/08/17 0523 02/08/17 1304  BP: 122/69 116/60 113/66   Pulse: (!) 104 100 (!) 105   Resp: 18 18 17 18   Temp: 98.4 F (36.9 C) 98.4 F (36.9 C) 97.8 F (36.6 C)   TempSrc: Oral Oral Oral Oral  SpO2: 100% 99% 99%   Weight:      Height:        Intake/Output Summary (Last 24 hours) at 02/08/17 1536 Last data filed at 02/08/17 1030  Gross per 24 hour  Intake              240 ml  Output                0 ml  Net              240 ml   Filed Weights   01/30/17 1315 01/31/17 1325 02/06/17 1401  Weight: 50.9 kg (112 lb 3.4 oz) 47.7 kg (105 lb 2.6 oz) 45.8 kg (100 lb 15.5 oz)    Examination: General exam: Conversant, in no acute distress Respiratory system: normal chest rise, clear, no audible wheezing Cardiovascular system: regular rhythm, s1-s2 Gastrointestinal system: Nondistended, nontender, pos BS Central nervous system: No seizures, no tremors Extremities: No cyanosis, no joint deformities Skin: No rashes, no pallor Psychiatry: Affect normal // no auditory hallucinations   Data Reviewed: I have personally reviewed following labs and imaging studies  CBC:  Recent Labs Lab 02/08/17 1029  WBC 10.9*  HGB 10.9*  HCT 34.2*  MCV 84.0  PLT 301*   Basic Metabolic Panel:  Recent Labs Lab 02/08/17 0511  NA 137  K 4.0  CL 101  CO2 28  GLUCOSE 103*  BUN 8  CREATININE 0.45  CALCIUM 8.7*   GFR: Estimated Creatinine Clearance: 75 mL/min (by C-G formula based on SCr of 0.45 mg/dL). Liver Function Tests:  Recent Labs Lab 02/08/17 0511  AST 15  ALT 14    ALKPHOS 55  BILITOT 0.4  PROT 7.3  ALBUMIN 2.7*   No results for input(s): LIPASE, AMYLASE in the last 168 hours. No results for input(s): AMMONIA in the last 168 hours. Coagulation Profile: No results for input(s): INR, PROTIME in the last 168 hours. Cardiac Enzymes: No results for input(s): CKTOTAL, CKMB, CKMBINDEX, TROPONINI in the last 168 hours. BNP (last 3 results) No results for input(s): PROBNP in the last 8760 hours. HbA1C: No results for input(s): HGBA1C in the last 72 hours. CBG: No results for input(s): GLUCAP in the last 168 hours. Lipid Profile:  No results for input(s): CHOL, HDL, LDLCALC, TRIG, CHOLHDL, LDLDIRECT in the last 72 hours. Thyroid Function Tests: No results for input(s): TSH, T4TOTAL, FREET4, T3FREE, THYROIDAB in the last 72 hours. Anemia Panel: No results for input(s): VITAMINB12, FOLATE, FERRITIN, TIBC, IRON, RETICCTPCT in the last 72 hours. Sepsis Labs: No results for input(s): PROCALCITON, LATICACIDVEN in the last 168 hours.  Recent Results (from the past 240 hour(s))  Culture, blood (single) w Reflex to ID Panel     Status: None   Collection Time: 01/30/17  8:50 AM  Result Value Ref Range Status   Specimen Description BLOOD BLOOD LEFT HAND  Final   Special Requests IN PEDIATRIC BOTTLE Blood Culture adequate volume  Final   Culture   Final    NO GROWTH 5 DAYS Performed at [facility], [address], [place], [place] [address]    Report Status 02/04/2017 FINAL  Final     Radiology Studies: Ct Abdomen Pelvis W Contrast  Result Date: 02/07/2017 CLINICAL DATA:  Acute nausea and non bilious vomiting. History of IV drug abuse. Inpatient. EXAM: CT ABDOMEN AND PELVIS WITH CONTRAST TECHNIQUE: Multidetector CT imaging of the abdomen and pelvis was performed using the standard protocol following bolus administration of intravenous contrast. CONTRAST:  < 75 cc > ISOVUE-300 IOPAMIDOL (ISOVUE-300) INJECTION 61% COMPARISON:  01/31/2017 right upper  quadrant abdominal sonogram. 01/13/2016 CT abdomen/pelvis. FINDINGS: Lower chest: Small dependent right pleural effusion, increased. Numerous irregular thick walled cavitary nodules throughout both lung bases, which generally demonstrate increased cavitation and decreased wall thickening since 01/27/2017 chest CT. For example, a 2.2 cm peripheral left lower lobe cavitary nodule (series 6/ image 13) is a mildly decreased from 2.2 cm and demonstrates decreased wall thickness. A 3.0 cm peripheral right lower lobe cavitary nodule (series 6/ image 3) is decreased from 3.7 cm and demonstrates decreased wall thickness and increased cavitation. A 3.8 cm cavitary nodule in the basilar right lower lobe (series 6/image 14) is mildly increased from 3.5 cm, although demonstrates increased cavitation and decreased wall thickness. Hepatobiliary: Normal liver with no liver mass. Normal gallbladder with no radiopaque cholelithiasis. No biliary ductal dilatation. Common bile duct diameter 5 mm. Pancreas: Normal, with no mass or duct dilation. Spleen: Normal size. No mass. Adrenals/Urinary Tract: Normal adrenals. Hypodense 1.0 cm lateral upper right renal cortical lesion with density 36 HU (series 2/ image 22), stable in size since 01/13/2016, suggesting a benign renal cyst. Additional tiny subcentimeter hypodense lower right and upper left renal cortical lesions are too small to characterize and also stable since 01/13/2016, suggesting benign renal cysts. No new renal lesions. No hydronephrosis. Normal bladder. Stomach/Bowel: Grossly normal stomach. Normal caliber small bowel with no small bowel wall thickening. Normal appendix. Normal large bowel with no diverticulosis, large bowel wall thickening or pericolonic fat stranding. Oral contrast progresses to the distal transverse colon. Vascular/Lymphatic: Normal caliber abdominal aorta. Patent portal, splenic, hepatic and renal veins. No pathologically enlarged lymph nodes in the  abdomen or pelvis. Reproductive: Grossly normal uterus. 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 No right adnexal mass. Generalized haziness of the pelvic fat. Other: No pneumoperitoneum, ascites or focal fluid collection. Musculoskeletal: No aggressive appearing focal osseous lesions. IMPRESSION: 1. Numerous cavitary septic emboli at the lung bases, which generally demonstrate decreased wall thickness and increased cavitation since 01/27/2017 chest CT, suggesting treatment response. Small dependent right pleural effusion is  mildly increased. Continued post treatment chest imaging follow-up recommended. 2. No evidence of bowel obstruction or acute bowel inflammation. Normal appendix . 3. 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 Nonspecific generalized haziness of the pelvic fat, which could be a sign of pelvic inflammatory disease versus potentially an inflammatory response to leaking left ovarian teratoma. No pelvic abscess. GYN consultation advised for consideration of resection of the teratoma given the size and growth. Electronically Signed   By: Ilona Sorrel M.D.   On: 02/07/2017 17:22    Scheduled Meds: . acidophilus  1 capsule Oral Daily  . buprenorphine-naloxone  1 tablet Sublingual QPM  . buprenorphine-naloxone  2 tablet Sublingual q morning - 10a  . enoxaparin (LOVENOX) injection  40 mg Subcutaneous Q24H  . FLUoxetine  10 mg Oral Daily  . nicotine  14 mg Transdermal Daily   Continuous Infusions: . sodium chloride 10 mL/hr at 02/02/17 2107  . doxycycline (VIBRAMYCIN) IV Stopped (02/08/17 1441)  . nafcillin IV Stopped (02/08/17 0904)     LOS: 12 days   CHIU, Orpah Melter, MD [facility] Pager [phone]  If 7PM-7AM, please contact night-coverage [email or web address] Password [facility] 02/08/2017, 3:36 PM

## 2017-02-08 NOTE — Care Management Note (Signed)
Case Management Note  Patient Details  Name: Sherri Francis MRN: [id number] Date of Birth: 1985-11-16  Subjective/Objective:   Drug taper suboxone                 Action/Plan: Date:  February 08, 2017 Chart reviewed for concurrent status and case management needs. Will continue to follow patient progress. Discharge Planning: following for needs Expected discharge date: [id number] Velva Harman, BSN, [place], [place]  Expected Discharge Date:   (unknown)               Expected Discharge Plan:  Home/Self Care  In-House Referral:     Discharge planning Services  CM Consult  Post Acute Care Choice:    Choice offered to:     DME Arranged:    DME Agency:     HH Arranged:    [place] Agency:     Status of Service:  In process, will continue to follow  If discussed at Long Length of Stay Meetings, dates discussed:    Additional Comments:  Leeroy Cha, RN 02/08/2017, 9:09 AM

## 2017-02-09 MED ORDER — BUPRENORPHINE HCL-NALOXONE HCL 8-2 MG SL SUBL: 1.0000 | SUBLINGUAL_TABLET | Freq: Every day | SUBLINGUAL | Status: DC

## 2017-02-09 MED ORDER — BUPRENORPHINE HCL-NALOXONE HCL 8-2 MG SL SUBL
1.0000 | SUBLINGUAL_TABLET | Freq: Two times a day (BID) | SUBLINGUAL | Status: DC
  Administered 2017-02-10: 1 via SUBLINGUAL
  Filled 2017-02-09 (×2): qty 1

## 2017-02-09 MED ORDER — BUPRENORPHINE HCL-NALOXONE HCL 8-2 MG SL SUBL
1.0000 | SUBLINGUAL_TABLET | Freq: Every evening | SUBLINGUAL | Status: AC
  Administered 2017-02-09: 1 via SUBLINGUAL
  Filled 2017-02-09: qty 1

## 2017-02-09 NOTE — Progress Notes (Signed)
PROGRESS NOTE    Sherri Francis  WUJ:[id number] DOB: 1986/03/15 DOA: 01/27/2017 PCP: Patient, No Pcp Per    Brief Narrative:  31 y.o.femalewith history of IVDA, hepatitis C, alcohol use, tobacco abuse, depression/anxiety with suicide attempts who presented to the ER on 8/19 with about a week of fever and cough. She was found to have bilateral pneumonia and was given vancomycin and ceftriaxone. She was going to be admitted to the hospital, but she left against medical advice because her withdrawal symptoms were severe. She was called back on the same date because 2/2 blood cultures were positive for MSSA and advised to come back to the hospital. She returned to the ER on 8/22. She has septic pulmonary embolic with a 1.5 x 9.5AO tricuspid valve vegetation with mild regurgitation.   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 No acute findings on imaging. - abd soft, pos BS. Given continued symptoms, ordered CT abd/pelvis. Reviewed. 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 Discussed case with on call GYN who recommends addition of doxycycline 100mg  bid to  treat PID - This am, pt reports some symptomatic improvement. Would continue doxy for 7-10 days tx   IVDA with heroin withdrawal -  Patient had been continued on suboxone 32 mg daily - SW consulted - UDS positive for opiates -  HIV NR - Consulted Psychiatry with  Recommendations on taper by 4mg  every 5 days. - Currently on 24mg  - Have written for taper dose of suboxone which will be completed on 9/14   Reported distant ETOH use - ETOH level noted to be negative - without evidence of ETOH withdrawals - Remains stable  Hypokalemia - Recently corrected -  Presently stable  Iron deficiency vs. Anemia of chronic disease -  had stopped iron supplementation due to nausea and vomiting - currently stable  Hepatitis C  - LFTs wnl -  hep C log10 5.748/ 560,000 IU/mL, genotype 1a - Pt would benefit from close follow-up with hepatology and/or infectious disease at time of discharge  Hypokalemia, mild - Corrected - Currently stable at this time  Depression/anxiety -  Started on 8/26 prozac -  Gabapentin prn anxiety -   avoid benzodiazepines -  Remains stable  DVT prophylaxis: Lovenox subcutaneous  Code Status: Full code  Family Communication: Patient in room, family not at bedside  Disposition Plan: Uncertain at this time, discharge planning in progress  Consultants:  Infectious disease  Psychiatry  Discussed case with on-call Gyn  Procedures:  CT chest ECHO CT abd/pelvis  Antimicrobials: Anti-infectives    Start     Dose/Rate Route Frequency Ordered Stop   02/08/17 1200  doxycycline (  VIBRAMYCIN) 100 mg in dextrose 5 % 250 mL IVPB     100 mg 125 mL/hr over 120 Minutes Intravenous Every 12 hours 02/08/17 1136     02/08/17 1100  doxycycline (VIBRA-TABS) tablet 100 mg  Status:  Discontinued     100 mg Oral Every 12 hours 02/08/17 1009 02/08/17 1136   01/28/17 1600  nafcillin 2 g in dextrose 5 % 100 mL IVPB     2 g 200 mL/hr over 30 Minutes Intravenous Every  4 hours 01/28/17 1445     01/28/17 1445  nafcillin injection 2 g  Status:  Discontinued     2 g Intravenous Every 4 hours 01/28/17 1435 01/28/17 1444   01/27/17 2200  ceFAZolin (ANCEF) IVPB 2g/100 mL premix  Status:  Discontinued     2 g 200 mL/hr over 30 Minutes Intravenous Every 8 hours 01/27/17 1702 01/28/17 1435   01/27/17 1400  vancomycin (VANCOCIN) IVPB 1000 mg/200 mL premix     1,000 mg 200 mL/hr over 60 Minutes Intravenous  Once 01/27/17 1348 01/27/17 1606      Subjective: Reports improvement with abd discomfort  Objective: Vitals:   02/08/17 0523 02/08/17 1304 02/08/17 1739 02/08/17 2055  BP: 113/66  127/89 124/79  Pulse: (!) 105  (!) 112 (!) 106  Resp: 17 18 18 18   Temp: 97.8 F (36.6 C)  98.8 F (37.1 C) 99.1 F (37.3 C)  TempSrc: Oral Oral Oral Oral  SpO2: 99%  100% 100%  Weight:      Height:        Intake/Output Summary (Last 24 hours) at 02/09/17 1408 Last data filed at 02/09/17 5427  Gross per 24 hour  Intake             1400 ml  Output              300 ml  Net             1100 ml   Filed Weights   01/30/17 1315 01/31/17 1325 02/06/17 1401  Weight: 50.9 kg (112 lb 3.4 oz) 47.7 kg (105 lb 2.6 oz) 45.8 kg (100 lb 15.5 oz)    Examination: General exam: Awake, laying in bed, in nad Respiratory system: Normal respiratory effort, no wheezing Cardiovascular system: regular rate, s1, s2 Gastrointestinal system: Soft, nondistended, positive BS Central nervous system: CN2-12 grossly intact, strength intact Extremities: Perfused, no clubbing Skin: Normal skin turgor, no notable skin lesions seen Psychiatry: Mood normal // no visual hallucinations   Data Reviewed: I have personally reviewed following labs and imaging studies  CBC:  Recent Labs Lab 02/08/17 1029  WBC 10.9*  HGB 10.9*  HCT 34.2*  MCV 84.0  PLT 062*   Basic Metabolic Panel:  Recent Labs Lab 02/08/17 0511  NA 137  K 4.0  CL 101  CO2 28  GLUCOSE 103*  BUN 8  CREATININE 0.45    CALCIUM 8.7*   GFR: Estimated Creatinine Clearance: 75 mL/min (by C-G formula based on SCr of 0.45 mg/dL). Liver Function Tests:  Recent Labs Lab 02/08/17 0511  AST 15  ALT 14  ALKPHOS 55  BILITOT 0.4  PROT 7.3  ALBUMIN 2.7*   No results for input(s): LIPASE, AMYLASE in the last 168 hours. No results for input(s): AMMONIA in the last 168 hours. Coagulation Profile: No results for input(s): INR, PROTIME in the last 168 hours. Cardiac Enzymes: No results for input(s): CKTOTAL, CKMB, CKMBINDEX, TROPONINI in the last 168 hours. BNP (last 3  results) No results for input(s): PROBNP in the last 8760 hours. HbA1C: No results for input(s): HGBA1C in the last 72 hours. CBG: No results for input(s): GLUCAP in the last 168 hours. Lipid Profile: No results for input(s): CHOL, HDL, LDLCALC, TRIG, CHOLHDL, LDLDIRECT in the last 72 hours. Thyroid Function Tests: No results for input(s): TSH, T4TOTAL, FREET4, T3FREE, THYROIDAB in the last 72 hours. Anemia Panel: No results for input(s): VITAMINB12, FOLATE, FERRITIN, TIBC, IRON, RETICCTPCT in the last 72 hours. Sepsis Labs: No results for input(s): PROCALCITON, LATICACIDVEN in the last 168 hours.  No results found for this or any previous visit (from the past 240 hour(s)).   Radiology Studies: Ct Abdomen Pelvis W Contrast  Result Date: 02/07/2017 CLINICAL DATA:  Acute nausea and non bilious vomiting. History of IV drug abuse. Inpatient. EXAM: CT ABDOMEN AND PELVIS WITH CONTRAST TECHNIQUE: Multidetector CT imaging of the abdomen and pelvis was performed using the standard protocol following bolus administration of intravenous contrast. CONTRAST:  < 75 cc > ISOVUE-300 IOPAMIDOL (ISOVUE-300) INJECTION 61% COMPARISON:  01/31/2017 right upper quadrant abdominal sonogram. 01/13/2016 CT abdomen/pelvis. FINDINGS: Lower chest: Small dependent right pleural effusion, increased. Numerous irregular thick walled cavitary nodules throughout both lung  bases, which generally demonstrate increased cavitation and decreased wall thickening since 01/27/2017 chest CT. For example, a 2.2 cm peripheral left lower lobe cavitary nodule (series 6/ image 13) is a mildly decreased from 2.2 cm and demonstrates decreased wall thickness. A 3.0 cm peripheral right lower lobe cavitary nodule (series 6/ image 3) is decreased from 3.7 cm and demonstrates decreased wall thickness and increased cavitation. A 3.8 cm cavitary nodule in the basilar right lower lobe (series 6/image 14) is mildly increased from 3.5 cm, although demonstrates increased cavitation and decreased wall thickness. Hepatobiliary: Normal liver with no liver mass. Normal gallbladder with no radiopaque cholelithiasis. No biliary ductal dilatation. Common bile duct diameter 5 mm. Pancreas: Normal, with no mass or duct dilation. Spleen: Normal size. No mass. Adrenals/Urinary Tract: Normal adrenals. Hypodense 1.0 cm lateral upper right renal cortical lesion with density 36 HU (series 2/ image 22), stable in size since 01/13/2016, suggesting a benign renal cyst. Additional tiny subcentimeter hypodense lower right and upper left renal cortical lesions are too small to characterize and also stable since 01/13/2016, suggesting benign renal cysts. No new renal lesions. No hydronephrosis. Normal bladder. Stomach/Bowel: Grossly normal stomach. Normal caliber small bowel with no small bowel wall thickening. Normal appendix. Normal large bowel with no diverticulosis, large bowel wall thickening or pericolonic fat stranding. Oral contrast progresses to the distal transverse colon. Vascular/Lymphatic: Normal caliber abdominal aorta. Patent portal, splenic, hepatic and renal veins. No pathologically enlarged lymph nodes in the abdomen or pelvis. Reproductive: Grossly normal uterus. 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 No right adnexal mass. Generalized haziness of the pelvic fat. Other: No pneumoperitoneum, ascites or focal fluid collection. Musculoskeletal: No aggressive appearing focal osseous lesions. IMPRESSION: 1. Numerous cavitary septic emboli at the lung bases, which generally demonstrate decreased wall thickness and increased cavitation since 01/27/2017 chest CT, suggesting treatment response. Small dependent right pleural effusion is mildly increased. Continued post treatment chest imaging follow-up recommended. 2. No evidence of bowel obstruction or acute bowel inflammation. Normal appendix . 3. 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 Nonspecific generalized haziness of the pelvic fat, which could be a sign  of pelvic inflammatory disease versus potentially an inflammatory response to leaking left ovarian teratoma. No pelvic abscess. GYN consultation advised for consideration of resection of the teratoma given the size and growth. Electronically Signed   By: Ilona Sorrel M.D.   On: 02/07/2017 17:22    Scheduled Meds: . acidophilus  1 capsule Oral Daily  . buprenorphine-naloxone  1 tablet Sublingual QPM  . buprenorphine-naloxone  2 tablet Sublingual q morning - 10a  . enoxaparin (LOVENOX) injection  40 mg Subcutaneous Q24H  . FLUoxetine  10 mg Oral Daily  . nicotine  14 mg Transdermal Daily   Continuous Infusions: . sodium chloride 10 mL/hr at 02/02/17 2107  . doxycycline (VIBRAMYCIN) IV 100 mg (02/09/17 1203)  . nafcillin IV Stopped (02/09/17 1233)     LOS: 13 days   Cordell Coke, Orpah Melter, MD [facility] Pager [phone]  If 7PM-7AM, please contact night-coverage [email or web address] Password [email or web address] 02/09/2017, 2:08 PM

## 2017-02-10 DIAGNOSIS — I330 Acute and subacute infective endocarditis: Secondary | ICD-10-CM

## 2017-02-10 MED ORDER — POLYETHYLENE GLYCOL 3350 17 G PO PACK
17.0000 g | PACK | Freq: Every day | ORAL | Status: DC
  Administered 2017-02-10: 17 g via ORAL
  Filled 2017-02-10 (×3): qty 1

## 2017-02-10 MED ORDER — SENNOSIDES-DOCUSATE SODIUM 8.6-50 MG PO TABS
1.0000 | ORAL_TABLET | Freq: Every day | ORAL | Status: DC
  Filled 2017-02-10: qty 1

## 2017-02-10 MED ORDER — SODIUM CHLORIDE 0.9% FLUSH
10.0000 mL | INTRAVENOUS | Status: DC | PRN
  Administered 2017-02-12: 10 mL
  Filled 2017-02-10: qty 40

## 2017-02-10 NOTE — Progress Notes (Signed)
PROGRESS NOTE  Sherri Francis KXF:[id number] DOB: 1985/10/28 DOA: 01/27/2017 PCP: Patient, No Pcp Per   LOS: 14 days   Brief Narrative / Interim history: 31 y.o.femalewith history of IVDA, hepatitis C, alcohol use, tobacco abuse, depression/anxiety with suicide attempts who presented to the ER on 8/19 with about a week of fever and cough. She was found to have bilateral pneumonia and was given vancomycin and ceftriaxone. She was going to be admitted to the hospital, but she left against medical advice because her withdrawal symptoms were severe. She was called back on the same date because 2/2 blood cultures were positive for MSSA and advised to come back to the hospital. She returned to the ER on 8/22. She has septic pulmonary embolic with a 1.5 x 6.9CV tricuspid valve vegetation with mild regurgitation.   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 Recommending IV Nafcillin through 9/13. Given IVDU, complex social issues and unable to place in SNF, continue inpatient treatment until end date -Blood cultures 8/22 with MSSA -Blood cultures 8/23, 8/24, 8/25: No growth  RUQ pain with nausea, recurrent, secondary to PID -LFTs wnl -lipase 24 -x-ray abdomen was recently ordered and reviewed. No acute findings on imaging. -abd soft, pos BS. Given continued symptoms, ordered CT abd/pelvis. Reviewed. 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 Dr. Wyline Copas discussed case with on call GYN who  recommends addition of doxycycline 100mg  bid to treat PID   IVDA with heroin withdrawal -Patient had been continued on suboxone, taper by 4 mg every 5 days -SW consulted -UDS positive for opiates -HIV NR -taper dose of suboxone will be completed on 9/14   Reported distant ETOH use -ETOH level noted to be negative -without evidence of ETOH withdrawals -Remains stable  Hypokalemia -Recently corrected  Iron deficiency vs. Anemia of chronic disease -had stopped iron supplementation due to nausea and vomiting -currently stable  Hepatitis C  -LFTs wnl -hep C log10 5.748/ 560,000 IU/mL, genotype 1a -Pt would benefit from close follow-up with hepatology and/or infectious disease at time of discharge  Depression/anxiety -Started on 8/26 prozac -Gabapentin prn anxiety - avoid benzodiazepines -Remains stable   DVT prophylaxis: Lovenox Code Status: Full code Family Communication: mother bedside Disposition Plan: home when ready   Consultants:   ID  Psychiatry   Procedures:   2D echo  Antimicrobials:  Nafcillin    Subjective: -Complains of nausea this morning, she is very emotional and is crying today.  Objective: Vitals:   02/09/17 1418 02/09/17 2031 02/10/17 0007 02/10/17 0619  BP: 126/90 (!) 101/51 108/66 116/74  Pulse: 86 92 75 91  Resp:  16  16  Temp: 98.4 F (36.9 C) 98.1 F (36.7 C)  98.1 F (36.7 C)  TempSrc: Oral Oral  Oral  SpO2:    97%  Weight:      Height:        Intake/Output Summary (Last 24 hours) at 02/10/17 1301 Last data filed at 02/09/17 1500  Gross per 24 hour  Intake  860 ml  Output                0 ml  Net              860 ml   Filed Weights   01/30/17 1315 01/31/17 1325 02/06/17 1401  Weight: 50.9 kg (112 lb 3.4 oz) 47.7 kg (105 lb 2.6 oz) 45.8 kg (100 lb 15.5 oz)    Examination:  Vitals:   02/09/17 1418 02/09/17 2031 02/10/17 0007 02/10/17 0619  BP: 126/90 (!) 101/51 108/66 116/74  Pulse: 86 92 75 91    Resp:  16  16  Temp: 98.4 F (36.9 C) 98.1 F (36.7 C)  98.1 F (36.7 C)  TempSrc: Oral Oral  Oral  SpO2:    97%  Weight:      Height:        Constitutional: crying Respiratory: clear to auscultation bilaterally, no wheezing, no crackles.  Cardiovascular: Regular rate and rhythm, no murmurs / rubs / gallops.  Data Reviewed: I have independently reviewed following labs and imaging studies   CBC:  Recent Labs Lab 02/08/17 1029  WBC 10.9*  HGB 10.9*  HCT 34.2*  MCV 84.0  PLT 102*   Basic Metabolic Panel:  Recent Labs Lab 02/08/17 0511  NA 137  K 4.0  CL 101  CO2 28  GLUCOSE 103*  BUN 8  CREATININE 0.45  CALCIUM 8.7*   GFR: Estimated Creatinine Clearance: 75 mL/min (by C-G formula based on SCr of 0.45 mg/dL). Liver Function Tests:  Recent Labs Lab 02/08/17 0511  AST 15  ALT 14  ALKPHOS 55  BILITOT 0.4  PROT 7.3  ALBUMIN 2.7*   No results for input(s): LIPASE, AMYLASE in the last 168 hours. No results for input(s): AMMONIA in the last 168 hours. Coagulation Profile: No results for input(s): INR, PROTIME in the last 168 hours. Cardiac Enzymes: No results for input(s): CKTOTAL, CKMB, CKMBINDEX, TROPONINI in the last 168 hours. BNP (last 3 results) No results for input(s): PROBNP in the last 8760 hours. HbA1C: No results for input(s): HGBA1C in the last 72 hours. CBG: No results for input(s): GLUCAP in the last 168 hours. Lipid Profile: No results for input(s): CHOL, HDL, LDLCALC, TRIG, CHOLHDL, LDLDIRECT in the last 72 hours. Thyroid Function Tests: No results for input(s): TSH, T4TOTAL, FREET4, T3FREE, THYROIDAB in the last 72 hours. Anemia Panel: No results for input(s): VITAMINB12, FOLATE, FERRITIN, TIBC, IRON, RETICCTPCT in the last 72 hours. Urine analysis:    Component Value Date/Time   COLORURINE STRAW (A) 01/27/2017 1516   APPEARANCEUR CLEAR 01/27/2017 1516   LABSPEC 1.004 (L) 01/27/2017 1516   PHURINE 7.0 01/27/2017 1516   GLUCOSEU  NEGATIVE 01/27/2017 1516   HGBUR SMALL (A) 01/27/2017 1516   BILIRUBINUR NEGATIVE 01/27/2017 1516   KETONESUR NEGATIVE 01/27/2017 1516   PROTEINUR NEGATIVE 01/27/2017 1516   UROBILINOGEN 0.2 10/15/2012 1341   NITRITE NEGATIVE 01/27/2017 1516   LEUKOCYTESUR SMALL (A) 01/27/2017 1516   Sepsis Labs: Invalid input(s): PROCALCITONIN, LACTICIDVEN  No results found for this or any previous visit (from the past 240 hour(s)).    Radiology Studies: No results found.   Scheduled Meds: . acidophilus  1 capsule Oral Daily  . buprenorphine-naloxone  1 tablet Sublingual Q12H  . [START ON 02/15/2017] buprenorphine-naloxone  1 tablet Sublingual Daily  . enoxaparin (LOVENOX) injection  40 mg Subcutaneous Q24H  . FLUoxetine  10 mg Oral Daily  . nicotine  14 mg Transdermal Daily   Continuous  Infusions: . sodium chloride 10 mL/hr at 02/02/17 2107  . doxycycline (VIBRAMYCIN) IV Stopped (02/10/17 0200)  . nafcillin IV Stopped (02/10/17 0430)     Marzetta Board, MD, PhD [facility] Pager [phone]  If 7PM-7AM, please contact night-coverage [email or web address] Password [email or web address] 02/10/2017, 1:01 PM

## 2017-02-10 NOTE — Progress Notes (Signed)
Peripherally Inserted Central Catheter/Midline Placement  The IV Nurse has discussed with the patient and/or persons authorized to consent for the patient, the purpose of this procedure and the potential benefits and risks involved with this procedure.  The benefits include less needle sticks, lab draws from the catheter, and the patient may be discharged home with the catheter. Risks include, but not limited to, infection, bleeding, blood clot (thrombus formation), and puncture of an artery; nerve damage and irregular heartbeat and possibility to perform a PICC exchange if needed/ordered by physician.  Alternatives to this procedure were also discussed.  Bard Power PICC patient education guide, fact sheet on infection prevention and patient information card has been provided to patient /or left at bedside.    PICC/Midline Placement Documentation        Sherri Francis 02/10/2017, 2:15 PM

## 2017-02-11 LAB — URINALYSIS, ROUTINE W REFLEX MICROSCOPIC
BILIRUBIN URINE: NEGATIVE
Glucose, UA: NEGATIVE mg/dL
Hgb urine dipstick: NEGATIVE
KETONES UR: 5 mg/dL — AB
Nitrite: NEGATIVE
Protein, ur: NEGATIVE mg/dL
SPECIFIC GRAVITY, URINE: 1.02 (ref 1.005–1.030)
pH: 6 (ref 5.0–8.0)

## 2017-02-11 LAB — RAPID URINE DRUG SCREEN, HOSP PERFORMED
AMPHETAMINES: NOT DETECTED
Barbiturates: NOT DETECTED
Benzodiazepines: NOT DETECTED
COCAINE: NOT DETECTED
OPIATES: NOT DETECTED
Tetrahydrocannabinol: NOT DETECTED

## 2017-02-11 MED ORDER — HYDROCODONE-ACETAMINOPHEN 5-325 MG PO TABS
1.0000 | ORAL_TABLET | Freq: Once | ORAL | Status: AC | PRN
  Administered 2017-02-12: 1 via ORAL
  Filled 2017-02-11: qty 1

## 2017-02-11 MED ORDER — CIPROFLOXACIN HCL 500 MG PO TABS: 500.0000 mg | ORAL_TABLET | Freq: Two times a day (BID) | ORAL | 0 refills | Status: DC

## 2017-02-11 MED ORDER — CEPHALEXIN 500 MG PO CAPS: 1000.0000 mg | ORAL_CAPSULE | Freq: Two times a day (BID) | ORAL | 0 refills | Status: AC

## 2017-02-11 MED ORDER — RIFAMPIN 300 MG PO CAPS: 300.0000 mg | ORAL_CAPSULE | Freq: Two times a day (BID) | ORAL | 0 refills | Status: DC

## 2017-02-11 MED ORDER — KETOROLAC TROMETHAMINE 15 MG/ML IJ SOLN
15.0000 mg | Freq: Four times a day (QID) | INTRAMUSCULAR | Status: DC | PRN
  Administered 2017-02-11: 15 mg via INTRAVENOUS
  Filled 2017-02-11: qty 1

## 2017-02-11 NOTE — Progress Notes (Signed)
PROGRESS NOTE  Sherri Francis GUY:[id number] DOB: 1985/09/26 DOA: 01/27/2017 PCP: Patient, No Pcp Per   LOS: 15 days   Brief Narrative / Interim history: 31 y.o.femalewith history of IVDA, hepatitis C, alcohol use, tobacco abuse, depression/anxiety with suicide attempts who presented to the ER on 8/19 with about a week of fever and cough. She was found to have bilateral pneumonia and was given vancomycin and ceftriaxone. She was going to be admitted to the hospital, but she left against medical advice because her withdrawal symptoms were severe. She was called back on the same date because 2/2 blood cultures were positive for MSSA and advised to come back to the hospital. She returned to the ER on 8/22. She has septic pulmonary embolic with a 1.5 x 5.6LO tricuspid valve vegetation with mild regurgitation.   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 Recommending IV Nafcillin through 9/13. Given IVDU, complex social issues and unable to place in SNF, continue inpatient treatment until end date -Blood cultures 8/22 with MSSA -Blood cultures 8/23, 8/24, 8/25: No growth  RUQ pain with nausea, recurrent, secondary to PID -LFTs wnl -lipase 24 -x-ray abdomen was recently ordered and reviewed. No acute findings on imaging. -abd soft, pos BS. Given continued symptoms, ordered CT abd/pelvis. Reviewed. 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 Dr. Wyline Copas discussed case with on call GYN who recommends addition of doxycycline  100mg  bid to treat PID   IVDA with heroin withdrawal -Patient had been continued on suboxone, taper by 4 mg every 5 days -SW consulted -UDS positive for opiates -HIV NR -taper dose of suboxone will be completed on 9/14 -Concern for patient actively using in the hospital his boyfriend has been visiting more frequently.  Discussed with RN to closely monitor and cap PICC line when not in use.  Check a urine drug screen   Reported distant ETOH use -ETOH level noted to be negative -without evidence of ETOH withdrawals -Remains stable  Hypokalemia -Recently corrected, recheck labs tomorrow morning  Iron deficiency vs. Anemia of chronic disease -had stopped iron supplementation due to nausea and vomiting -currently stable  Hepatitis C  -LFTs wnl -hep C log10 5.748/ 560,000 IU/mL, genotype 1a -Pt would benefit from close follow-up with hepatology and/or infectious disease at time of discharge  Depression/anxiety -Started on 8/26 prozac -Gabapentin prn anxiety -avoid benzodiazepines -Remains stable   DVT prophylaxis: Lovenox Code Status: Full code Family Communication: mother bedside Disposition Plan: home when ready   Consultants:   ID  Psychiatry   Procedures:   2D echo  Antimicrobials:  Nafcillin    Subjective: -Continues to be quite emotional and crying, insisting that she gets IV narcotics for abdominal pain.  Firmly convinced that nothing else will work.    Objective: Vitals:   02/09/17 2031 02/10/17 0007 02/10/17 0619 02/10/17 1427  BP:  108/66 116/74 121/88  Pulse:  75 91 (!) 107  Resp: 16  16 18   Temp: 98.1 F (36.7 C)  98.1 F (36.7 C) 98.2 F (36.8 C)  TempSrc: Oral  Oral Oral  SpO2:  97% 100%  Weight:      Height:       No intake or output data in the 24 hours ending 02/11/17 1158 Filed Weights   01/30/17 1315 01/31/17 1325 02/06/17 1401  Weight: 50.9 kg (112 lb 3.4 oz) 47.7 kg (105 lb 2.6 oz) 45.8 kg (100 lb 15.5 oz)     Examination:  Vitals:   02/09/17 2031 02/10/17 0007 02/10/17 0619 02/10/17 1427  BP:  108/66 116/74 121/88  Pulse:  75 91 (!) 107  Resp: 16  16 18   Temp: 98.1 F (36.7 C)  98.1 F (36.7 C) 98.2 F (36.8 C)  TempSrc: Oral  Oral Oral  SpO2:   97% 100%  Weight:      Height:        Constitutional: crying Respiratory: CTA  Cardiovascular: RRR Abd: soft, reports tenderness throughout  Data Reviewed: I have independently reviewed following labs and imaging studies   CBC:  Recent Labs Lab 02/08/17 1029  WBC 10.9*  HGB 10.9*  HCT 34.2*  MCV 84.0  PLT 947*   Basic Metabolic Panel:  Recent Labs Lab 02/08/17 0511  NA 137  K 4.0  CL 101  CO2 28  GLUCOSE 103*  BUN 8  CREATININE 0.45  CALCIUM 8.7*   GFR: Estimated Creatinine Clearance: 75 mL/min (by C-G formula based on SCr of 0.45 mg/dL). Liver Function Tests:  Recent Labs Lab 02/08/17 0511  AST 15  ALT 14  ALKPHOS 55  BILITOT 0.4  PROT 7.3  ALBUMIN 2.7*   No results for input(s): LIPASE, AMYLASE in the last 168 hours. No results for input(s): AMMONIA in the last 168 hours. Coagulation Profile: No results for input(s): INR, PROTIME in the last 168 hours. Cardiac Enzymes: No results for input(s): CKTOTAL, CKMB, CKMBINDEX, TROPONINI in the last 168 hours. BNP (last 3 results) No results for input(s): PROBNP in the last 8760 hours. HbA1C: No results for input(s): HGBA1C in the last 72 hours. CBG: No results for input(s): GLUCAP in the last 168 hours. Lipid Profile: No results for input(s): CHOL, HDL, LDLCALC, TRIG, CHOLHDL, LDLDIRECT in the last 72 hours. Thyroid Function Tests: No results for input(s): TSH, T4TOTAL, FREET4, T3FREE, THYROIDAB in the last 72 hours. Anemia Panel: No results for input(s): VITAMINB12, FOLATE, FERRITIN, TIBC, IRON, RETICCTPCT in the last 72 hours. Urine analysis:    Component Value Date/Time   COLORURINE STRAW (A) 01/27/2017 1516   APPEARANCEUR CLEAR 01/27/2017  1516   LABSPEC 1.004 (L) 01/27/2017 1516   PHURINE 7.0 01/27/2017 1516   GLUCOSEU NEGATIVE 01/27/2017 1516   HGBUR SMALL (A) 01/27/2017 1516   BILIRUBINUR NEGATIVE 01/27/2017 1516   KETONESUR NEGATIVE 01/27/2017 1516   PROTEINUR NEGATIVE 01/27/2017 1516   UROBILINOGEN 0.2 10/15/2012 1341   NITRITE NEGATIVE 01/27/2017 1516   LEUKOCYTESUR SMALL (A) 01/27/2017 1516   Sepsis Labs: Invalid input(s): PROCALCITONIN, LACTICIDVEN  No results found for this or any previous visit (from the past 240 hour(s)).    Radiology Studies: No results found.   Scheduled Meds: . acidophilus  1 capsule Oral Daily  . buprenorphine-naloxone  1 tablet Sublingual Q12H  . [START ON 02/15/2017] buprenorphine-naloxone  1 tablet Sublingual Daily  . enoxaparin (LOVENOX) injection  40 mg Subcutaneous Q24H  . FLUoxetine  10 mg Oral Daily  . nicotine  14 mg Transdermal Daily  . polyethylene glycol  17 g Oral Daily  . senna-docusate  1 tablet Oral QHS   Continuous Infusions: . sodium chloride 10 mL/hr at  02/02/17 2107  . doxycycline (VIBRAMYCIN) IV Stopped (02/11/17 0218)  . nafcillin IV Stopped (02/11/17 5830)   Time spent: 25 minutes, > 50% in the room discussing with patient/mother/RN  Marzetta Board, MD, PhD [facility] Pager [phone]  If 7PM-7AM, please contact night-coverage [email or web address] Password [email or web address] 02/11/2017, 11:58 AM

## 2017-02-11 NOTE — Care Management Note (Signed)
Case Management Note  Patient Details  Name: Sherri Francis MRN: [id number] Date of Birth: January 06, 1986  Subjective/Objective:                  Safety issue sue to phm of abuse ands compliance/suboxone taper on schedule  Action/Plan: Date:  February 11, 2017 Chart reviewed for concurrent status and case management needs. Will continue to follow patient progress. Discharge Planning: following for needs Expected discharge date: [id number] Velva Harman, BSN, [place], [place]  Expected Discharge Date:   (unknown)               Expected Discharge Plan:  Home/Self Care  In-House Referral:     Discharge planning Services  CM Consult  Post Acute Care Choice:    Choice offered to:     DME Arranged:    DME Agency:     HH Arranged:    [place] Agency:     Status of Service:  In process, will continue to follow  If discussed at Long Length of Stay Meetings, dates discussed:    Additional Comments:  Leeroy Cha, RN 02/11/2017, 11:19 AM

## 2017-02-11 NOTE — Progress Notes (Addendum)
CSW provided emotional support to patient mother. She is concern about patient pain control and leaving AMA. She is concern about patient warrant for arrest for missing court date. She reports patient attorney will accept a letter from physician indicating hospital admission. Physician is aware.   Patient aware physician completed letter. Nurse informed to print and place on patient chart for pt. Mother to pick up per patient. CSW informed pt. Mother.   Kathrin Greathouse, Latanya Presser, MSW Clinical Social Worker 5E and Psychiatric Service Line [phone] 02/11/2017  3:17 PM

## 2017-02-12 LAB — COMPREHENSIVE METABOLIC PANEL
ALBUMIN: 2.9 g/dL — AB (ref 3.5–5.0)
ALK PHOS: 64 U/L (ref 38–126)
ALT: 14 U/L (ref 14–54)
AST: 15 U/L (ref 15–41)
Anion gap: 10 (ref 5–15)
BUN: 7 mg/dL (ref 6–20)
CALCIUM: 8.9 mg/dL (ref 8.9–10.3)
CHLORIDE: 97 mmol/L — AB (ref 101–111)
CO2: 28 mmol/L (ref 22–32)
CREATININE: 0.59 mg/dL (ref 0.44–1.00)
GFR calc Af Amer: >60 mL/min (ref >60)
GFR calc non Af Amer: >60 mL/min (ref >60)
GLUCOSE: 104 mg/dL — AB (ref 65–99)
Potassium: 3.1 mmol/L — ABNORMAL LOW (ref 3.5–5.1)
Sodium: 135 mmol/L (ref 135–145)
Total Bilirubin: 1 mg/dL (ref 0.3–1.2)
Total Protein: 7.9 g/dL (ref 6.5–8.1)

## 2017-02-12 LAB — CBC
HEMATOCRIT: 35.4 % — AB (ref 36.0–46.0)
HEMOGLOBIN: 11.3 g/dL — AB (ref 12.0–15.0)
MCH: 26.7 pg (ref 26.0–34.0)
MCHC: 31.9 g/dL (ref 30.0–36.0)
MCV: 83.7 fL (ref 78.0–100.0)
PLATELETS: 725 10*3/uL — AB (ref 150–400)
RBC: 4.23 MIL/uL (ref 3.87–5.11)
RDW: 16.3 % — ABNORMAL HIGH (ref 11.5–15.5)
WBC: 6.7 10*3/uL (ref 4.0–10.5)

## 2017-02-12 LAB — TROPONIN I: Troponin I: <0.03 ng/mL (ref <0.03)

## 2017-02-12 MED ORDER — POTASSIUM CHLORIDE CRYS ER 20 MEQ PO TBCR: 40.0000 meq | EXTENDED_RELEASE_TABLET | Freq: Once | ORAL | Status: DC

## 2017-02-12 MED ORDER — HYDROCODONE-ACETAMINOPHEN 5-325 MG PO TABS
1.0000 | ORAL_TABLET | Freq: Four times a day (QID) | ORAL | Status: DC | PRN
  Administered 2017-02-12: 1 via ORAL
  Filled 2017-02-12: qty 1

## 2017-02-12 NOTE — Progress Notes (Addendum)
Patient advised not to get into the shower per Dr order. Patient has a PICC line and has received education on her condition and infection control risks. Patient stated, " I understand"  Patient was found in shower with right arm dangling out of shower to prevent her PICC dressing from coming in contact with water.  Patient family was present (mother). Family member assisted patient to shower.  Primary nurse and nursing instructor notified

## 2017-02-12 NOTE — Progress Notes (Signed)
PROGRESS NOTE  Sherri Francis OVF:[id number] DOB: 1985-11-05 DOA: 01/27/2017 PCP: Patient, No Pcp Per   LOS: 16 days   Brief Narrative / Interim history: 31 y.o.femalewith history of IVDA, hepatitis C, alcohol use, tobacco abuse, depression/anxiety with suicide attempts who presented to the ER on 8/19 with about a week of fever and cough. She was found to have bilateral pneumonia and was given vancomycin and ceftriaxone. She was going to be admitted to the hospital, but she left against medical advice because her withdrawal symptoms were severe. She was called back on the same date because 2/2 blood cultures were positive for MSSA and advised to come back to the hospital. She returned to the ER on 8/22. She has septic pulmonary embolic with a 1.5 x 8.4ZY tricuspid valve vegetation with mild regurgitation.   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 Recommending IV Nafcillin through 9/13. Given IVDU, complex social issues and unable to place in SNF, continue inpatient treatment until end date -Blood cultures 8/22 with MSSA -Blood cultures 8/23, 8/24, 8/25: No growth  RUQ pain with nausea, recurrent, secondary to PID -LFTs wnl -lipase 24 -x-ray abdomen was recently ordered and reviewed. No acute findings on imaging. -abd soft, pos BS. Given continued symptoms, ordered CT abd/pelvis. Reviewed. 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 Dr. Wyline Copas discussed case with on call GYN who recommends addition of doxycycline  100mg  bid to treat PID -Persistent pain, will obtain abdominal ultrasound, persistent nausea obtain gastric emptying study   IVDA with heroin withdrawal -Patient had been continued on suboxone, taper by 4 mg every 5 days -SW consulted -UDS positive for opiates -HIV NR -taper dose of suboxone will be completed on 9/14 -Concern for patient actively using in the hospital his boyfriend has been visiting more frequently.  Discussed with RN to closely monitor and cap PICC line when not in use.  Checked a urine drug screen which returned negative  Reported distant ETOH use -ETOH level noted to be negative -without evidence of ETOH withdrawals -Remains stable  Hypokalemia -Labs this morning showed stability requires K supplementation  Iron deficiency vs. Anemia of chronic disease -had stopped iron supplementation due to nausea and vomiting -currently stable  Hepatitis C  -LFTs wnl -hep C log10 5.748/ 560,000 IU/mL, genotype 1a -Pt would benefit from close follow-up with hepatology and/or infectious disease at time of discharge  Depression/anxiety -Started on 8/26 prozac -Gabapentin prn anxiety -avoid benzodiazepines -Remains stable   DVT prophylaxis: Lovenox Code Status: Full code Family Communication: No family at bedside Disposition Plan: home when ready   Consultants:   ID  Psychiatry   Procedures:   2D echo  Antimicrobials:  Nafcillin    Subjective: -Very emotional, yesterday threatened to leave [place] however later decided to stay  Objective: Vitals:   02/10/17 0619 02/10/17 1427 02/11/17 1346 02/12/17 1034  BP: 116/74 121/88 (!) 164/104 (!) 144/94  Pulse: 91 (!) 107 95 (!) 105  Resp: 16 18 20 16   Temp:   98.4 F (36.9 C) 97.8 F (36.6 C)  TempSrc: Oral Oral  Oral Oral  SpO2: 97% 100% 100%   Weight:      Height:        Intake/Output Summary (Last 24 hours) at 02/12/17 1201 Last data filed at 02/12/17 0811  Gross per 24 hour    Intake               10 ml  Output                0 ml  Net               10 ml   Filed Weights   01/30/17 1315 01/31/17 1325 02/06/17 1401  Weight: 50.9 kg (112 lb 3.4 oz) 47.7 kg (105 lb 2.6 oz) 45.8 kg (100 lb 15.5 oz)    Examination:  Vitals:   02/10/17 0619 02/10/17 1427 02/11/17 1346 02/12/17 1034  BP: 116/74 121/88 (!) 164/104 (!) 144/94  Pulse: 91 (!) 107 95 (!) 105  Resp: 16 18 20 16   Temp:   98.4 F (36.9 C) 97.8 F (36.6 C)  TempSrc: Oral Oral Oral Oral  SpO2: 97% 100% 100%   Weight:      Height:        Constitutional: crying Respiratory: CTA Cardiovascular: RRR  Data Reviewed: I have independently reviewed following labs and imaging studies   CBC:  Recent Labs Lab 02/08/17 1029 02/12/17 0409  WBC 10.9* 6.7  HGB 10.9* 11.3*  HCT 34.2* 35.4*  MCV 84.0 83.7  PLT 844* 867*   Basic Metabolic Panel:  Recent Labs Lab 02/08/17 0511 02/12/17 0409  NA 137 135  K 4.0 3.1*  CL 101 97*  CO2 28 28  GLUCOSE 103* 104*  BUN 8 7  CREATININE 0.45 0.59  CALCIUM 8.7* 8.9   GFR: Estimated Creatinine Clearance: 75 mL/min (by C-G formula based on SCr of 0.59 mg/dL). Liver Function Tests:  Recent Labs Lab 02/08/17 0511 02/12/17 0409  AST 15 15  ALT 14 14  ALKPHOS 55 64  BILITOT 0.4 1.0  PROT 7.3 7.9  ALBUMIN 2.7* 2.9*   No results for input(s): LIPASE, AMYLASE in the last 168 hours. No results for input(s): AMMONIA in the last 168 hours. Coagulation Profile: No results for input(s): INR, PROTIME in the last 168 hours. Cardiac Enzymes: No results for input(s): CKTOTAL, CKMB, CKMBINDEX, TROPONINI in the last 168 hours. BNP (last 3 results) No results for input(s): PROBNP in the last 8760 hours. HbA1C: No results for input(s): HGBA1C in the last 72 hours. CBG: No results for input(s): GLUCAP in the last 168 hours. Lipid Profile: No results for input(s): CHOL, HDL, LDLCALC, TRIG, CHOLHDL, LDLDIRECT in the last 72 hours. Thyroid Function  Tests: No results for input(s): TSH, T4TOTAL, FREET4, T3FREE, THYROIDAB in the last 72 hours. Anemia Panel: No results for input(s): VITAMINB12, FOLATE, FERRITIN, TIBC, IRON, RETICCTPCT in the last 72 hours. Urine analysis:    Component Value Date/Time   COLORURINE YELLOW 02/11/2017 1238   APPEARANCEUR CLOUDY (A) 02/11/2017 1238   LABSPEC 1.020 02/11/2017 1238   PHURINE 6.0 02/11/2017 1238   GLUCOSEU NEGATIVE 02/11/2017 1238   HGBUR NEGATIVE 02/11/2017 1238   BILIRUBINUR NEGATIVE 02/11/2017 1238   KETONESUR 5 (A) 02/11/2017 1238   PROTEINUR NEGATIVE 02/11/2017 1238   UROBILINOGEN 0.2 10/15/2012 1341   NITRITE NEGATIVE 02/11/2017 1238   LEUKOCYTESUR SMALL (A) 02/11/2017 1238   Sepsis Labs: Invalid input(s): PROCALCITONIN, LACTICIDVEN  No results found for this or any previous visit (from the past 240 hour(s)).  Radiology Studies: No results found.   Scheduled Meds: . acidophilus  1 capsule Oral Daily  . buprenorphine-naloxone  1 tablet Sublingual Q12H  . [START ON 02/15/2017] buprenorphine-naloxone  1 tablet Sublingual Daily  . enoxaparin (LOVENOX) injection  40 mg Subcutaneous Q24H  . FLUoxetine  10 mg Oral Daily  . nicotine  14 mg Transdermal Daily  . polyethylene glycol  17 g Oral Daily  . potassium chloride  40 mEq Oral Once  . senna-docusate  1 tablet Oral QHS   Continuous Infusions: . sodium chloride 10 mL/hr at 02/02/17 2107  . doxycycline (VIBRAMYCIN) IV Stopped (02/12/17 0249)  . nafcillin IV Stopped (02/12/17 2956)   Marzetta Board, MD, PhD [facility] Pager [phone]  If 7PM-7AM, please contact night-coverage [email or web address] Password [email or web address] 02/12/2017, 12:01 PM

## 2017-02-12 NOTE — Progress Notes (Addendum)
Patient presents with vomiting,irritability, and refused care during beginning of shift.  Upon  assessment /comfort care intervention, patient  states, she prefers to complete morning ADL's independently daily after 1100 only. Patient also stated, she is not a morning person and prefers less entry into her room as possible during the night and early morning hours. Upon reassessment of comfort care interventions, patient vomiting and irritability decreased and patient became receptive and compliant with care

## 2017-02-12 NOTE — Progress Notes (Signed)
Patient refused to go to have her chest x-ray done. MD notified. Advised patient to notify night RN when she is ready to have her chest x-ray done.

## 2017-02-13 ENCOUNTER — Inpatient Hospital Stay (HOSPITAL_COMMUNITY): Payer: Self-pay

## 2017-02-13 ENCOUNTER — Encounter (HOSPITAL_COMMUNITY): Payer: Self-pay | Admitting: Radiology

## 2017-02-13 ENCOUNTER — Emergency Department (HOSPITAL_COMMUNITY): Admission: EM | Admit: 2017-02-13 | Discharge: 2017-02-13 | Discharge status: Absent without leave | Payer: MEDICAID

## 2017-02-13 DIAGNOSIS — R52 Pain, unspecified: Secondary | ICD-10-CM

## 2017-02-13 LAB — TROPONIN I
Troponin I: <0.03 ng/mL (ref <0.03)
Troponin I: <0.03 ng/mL (ref <0.03)

## 2017-02-13 LAB — D-DIMER, QUANTITATIVE (NOT AT ARMC): D DIMER QUANT: 0.7 ug{FEU}/mL — AB (ref 0.00–0.50)

## 2017-02-13 MED ORDER — PROMETHAZINE HCL 25 MG/ML IJ SOLN
25.0000 mg | Freq: Once | INTRAMUSCULAR | Status: AC
  Administered 2017-02-13: 25 mg via INTRAVENOUS

## 2017-02-13 MED ORDER — IOPAMIDOL (ISOVUE-370) INJECTION 76%
100.0000 mL | Freq: Once | INTRAVENOUS | Status: AC | PRN
  Administered 2017-02-13: 100 mL via INTRAVENOUS

## 2017-02-13 MED ORDER — IOPAMIDOL (ISOVUE-370) INJECTION 76%
INTRAVENOUS | Status: AC
  Filled 2017-02-13: qty 100

## 2017-02-13 NOTE — Progress Notes (Signed)
PROGRESS NOTE  Sherri Francis ASN:[id number] DOB: 01-Aug-1985 DOA: 01/27/2017 PCP: Patient, No Pcp Per   LOS: 17 days   Brief Narrative / Interim history: 31 y.o.femalewith history of IVDA, hepatitis C, alcohol use, tobacco abuse, depression/anxiety with suicide attempts who presented to the ER on 8/19 with about a week of fever and cough. She was found to have bilateral pneumonia and was given vancomycin and ceftriaxone. She was going to be admitted to the hospital, but she left against medical advice because her withdrawal symptoms were severe. She was called back on the same date because 2/2 blood cultures were positive for MSSA and advised to come back to the hospital. She returned to the ER on 8/22. She has septic pulmonary embolic with a 1.5 x 1.9FX tricuspid valve vegetation with mild regurgitation.   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 Recommending IV Nafcillin through 9/13. Given IVDU, complex social issues and unable to place in SNF, continue inpatient treatment until end date -Blood cultures 8/22 with MSSA -Blood cultures 8/23, 8/24, 8/25: No growth  Chest pain -Patient complaint of chest pain last night, cardiac enzymes were cycled and they have remained negative.  Chest pain has not resolved.  D-dimer was checked and was found to be elevated.  Given d-dimer elevation, the fact that patient has been refusing her subcutaneous Lovenox, underwent a CT angiogram today which was negative for PE  RUQ pain with nausea, recurrent, secondary to PID -LFTs wnl -lipase  24 -x-ray abdomen was recently ordered and reviewed. No acute findings on imaging. -abd soft, pos BS. Given continued symptoms, ordered CT abd/pelvis. Reviewed. 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 Dr. Wyline Copas discussed case with on call GYN who recommends addition of doxycycline 100mg  bid to treat PID -Persistent pain, will obtain abdominal ultrasound, persistent nausea obtain gastric emptying study   IVDA with heroin withdrawal -Patient had been continued on suboxone, taper by 4 mg every 5 days -SW consulted -UDS positive for opiates -HIV NR -taper dose of suboxone will be completed on 9/14 -Concern for patient actively using in the hospital his boyfriend has been visiting more frequently.  Discussed with RN to closely monitor and cap PICC line when not in use.  Checked a urine drug screen which returned negative  Reported distant ETOH use -ETOH level noted to be negative -without evidence of ETOH withdrawals -Remains stable  Hypokalemia -Recheck labs tomorrow morning  Iron deficiency vs. Anemia of chronic disease -had stopped iron supplementation due to nausea and vomiting -currently stable  Hepatitis C  -LFTs wnl -hep C log10 5.748/ 560,000 IU/mL, genotype 1a -Pt would benefit from close follow-up with hepatology and/or infectious disease at time of discharge  Depression/anxiety -Started on 8/26 prozac -Gabapentin prn anxiety -avoid benzodiazepines -Remains stable   DVT prophylaxis: Lovenox Code Status: Full code Family Communication: No family at bedside, discussed with mother and father last night Disposition Plan: home when ready   Consultants:   ID  Psychiatry   Procedures:   2D echo  Antimicrobials:  Nafcillin    Subjective: -Continues to be  emotional and occasionally displays childlike behavior  Objective: Vitals:   02/12/17 1034 02/12/17 1430 02/12/17 1628 02/12/17 2042  BP:  (!) 144/94 140/88 (!) 142/110 124/86  Pulse: (!) 105 98 100 (!) 101  Resp: 16 16 16 16   Temp: 97.8 F (36.6 C) 97.8 F (36.6 C) 98.3 F (36.8 C) 98.6 F (37 C)  TempSrc: Oral Oral Oral Oral  SpO2:    100%  Weight:      Height:        Intake/Output Summary (Last 24 hours) at 02/13/17 1035 Last data filed at 02/12/17 2045  Gross per 24 hour  Intake              250 ml  Output                0 ml  Net              250 ml   Filed Weights   01/30/17 1315 01/31/17 1325 02/06/17 1401  Weight: 50.9 kg (112 lb 3.4 oz) 47.7 kg (105 lb 2.6 oz) 45.8 kg (100 lb 15.5 oz)    Examination:  Vitals:   02/12/17 1034 02/12/17 1430 02/12/17 1628 02/12/17 2042  BP: (!) 144/94 140/88 (!) 142/110 124/86  Pulse: (!) 105 98 100 (!) 101  Resp: 16 16 16 16   Temp: 97.8 F (36.6 C) 97.8 F (36.6 C) 98.3 F (36.8 C) 98.6 F (37 C)  TempSrc: Oral Oral Oral Oral  SpO2:    100%  Weight:      Height:        Constitutional: Sleeping when I entered the room, then wakes up and cries Eyes: lids and conjunctivae normal ENMT: Mucous membranes are moist.  Respiratory: clear to auscultation bilaterally, no wheezing, no crackles. Normal respiratory effort.  Cardiovascular: Regular rate and rhythm, no murmurs / rubs / gallops. No LE edema. 2+ pedal pulses.  Abdomen: no tenderness. Bowel sounds positive.   Data Reviewed: I have independently reviewed following labs and imaging studies   CBC:  Recent Labs Lab 02/08/17 1029 02/12/17 0409  WBC 10.9* 6.7  HGB 10.9* 11.3*  HCT 34.2* 35.4*  MCV 84.0 83.7  PLT 844* 384*   Basic Metabolic Panel:  Recent Labs Lab 02/08/17 0511 02/12/17 0409  NA 137 135  K 4.0 3.1*  CL 101 97*  CO2 28 28  GLUCOSE 103* 104*  BUN 8 7  CREATININE 0.45 0.59  CALCIUM 8.7* 8.9   GFR: Estimated Creatinine Clearance: 75 mL/min (by C-G formula based on SCr of 0.59 mg/dL). Liver Function Tests:  Recent Labs Lab 02/08/17 0511 02/12/17 0409  AST 15 15  ALT 14 14   ALKPHOS 55 64  BILITOT 0.4 1.0  PROT 7.3 7.9  ALBUMIN 2.7* 2.9*   No results for input(s): LIPASE, AMYLASE in the last 168 hours. No results for input(s): AMMONIA in the last 168 hours. Coagulation Profile: No results for input(s): INR, PROTIME in the last 168 hours. Cardiac Enzymes:  Recent Labs Lab 02/12/17 2053 02/13/17 0401 02/13/17 0946  TROPONINI <0.03 <0.03 <0.03   BNP (last 3 results) No results for input(s): PROBNP in the last 8760 hours. HbA1C: No results for input(s): HGBA1C in the last 72 hours. CBG: No results for input(s): GLUCAP in the last 168 hours. Lipid Profile: No results for input(s): CHOL, HDL, LDLCALC, TRIG, CHOLHDL, LDLDIRECT in the last 72 hours. Thyroid Function Tests: No results for input(s): TSH, T4TOTAL, FREET4, T3FREE, THYROIDAB in the last 72  hours. Anemia Panel: No results for input(s): VITAMINB12, FOLATE, FERRITIN, TIBC, IRON, RETICCTPCT in the last 72 hours. Urine analysis:    Component Value Date/Time   COLORURINE YELLOW 02/11/2017 1238   APPEARANCEUR CLOUDY (A) 02/11/2017 1238   LABSPEC 1.020 02/11/2017 1238   PHURINE 6.0 02/11/2017 1238   GLUCOSEU NEGATIVE 02/11/2017 1238   HGBUR NEGATIVE 02/11/2017 1238   BILIRUBINUR NEGATIVE 02/11/2017 1238   KETONESUR 5 (A) 02/11/2017 1238   PROTEINUR NEGATIVE 02/11/2017 1238   UROBILINOGEN 0.2 10/15/2012 1341   NITRITE NEGATIVE 02/11/2017 1238   LEUKOCYTESUR SMALL (A) 02/11/2017 1238   Sepsis Labs: Invalid input(s): PROCALCITONIN, LACTICIDVEN  No results found for this or any previous visit (from the past 240 hour(s)).    Radiology Studies: Ct Angio Chest Pe W Or Wo Contrast  Result Date: 02/13/2017 CLINICAL DATA:  Known septic pulmonary emboli. Known tricuspid valve vegetation. History of IV drug abuse. Fever and cough. EXAM: CT ANGIOGRAPHY CHEST WITH CONTRAST TECHNIQUE: Multidetector CT imaging of the chest was performed using the standard protocol during bolus administration of  intravenous contrast. Multiplanar CT image reconstructions and MIPs were obtained to evaluate the vascular anatomy. CONTRAST:  100 mL of Isovue 370 COMPARISON:  Chest x-ray January 24, 2017.  Chest CT January 27, 2017. FINDINGS: Cardiovascular: The heart size is normal. The reported tricuspid valve vegetation is not seen on CT imaging. No coronary artery calcifications are noted. The thoracic aorta is normal in caliber with no dissection. No pulmonary emboli. Mediastinum/Nodes: The prominent right hilar nodes seen previously measures 10 mm, unchanged. No new adenopathy. The esophagus and thyroid are normal. No effusions. A mildly prominent node containing calcifications in the right axilla stable, likely from previous granulomatous infection. Lungs/Pleura: The central airways are within normal limits. No pneumothorax. Numerous cavitary nodules are again identified consistent with history of septic emboli. However, there has been improvement and the numerous nodules are smaller in the interval and the peripheral walls of these nodules are thinner. A few of the nodules have resolved. No new nodules identified. No new infiltrate. Upper Abdomen: No acute abnormality. Musculoskeletal: No chest wall abnormality. No acute or significant osseous findings. Review of the MIP images confirms the above findings. IMPRESSION: 1. Persistent but improving cavitary nodules in the lungs consistent with septic emboli. 2. No pulmonary embolus identified. 3. No other acute abnormalities.  Reactive node in the right hilum. Electronically Signed   By: Dorise Bullion III M.D   On: 02/13/2017 08:57     Scheduled Meds: . acidophilus  1 capsule Oral Daily  . buprenorphine-naloxone  1 tablet Sublingual Q12H  . [START ON 02/15/2017] buprenorphine-naloxone  1 tablet Sublingual Daily  . enoxaparin (LOVENOX) injection  40 mg Subcutaneous Q24H  . FLUoxetine  10 mg Oral Daily  . iopamidol      . nicotine  14 mg Transdermal Daily  .  polyethylene glycol  17 g Oral Daily  . potassium chloride  40 mEq Oral Once  . senna-docusate  1 tablet Oral QHS   Continuous Infusions: . sodium chloride 10 mL/hr at 02/02/17 2107  . doxycycline (VIBRAMYCIN) IV Stopped (02/13/17 0200)  . nafcillin IV Stopped (02/13/17 1505)   Marzetta Board, MD, PhD [facility] Pager [phone]  If 7PM-7AM, please contact night-coverage [email or web address] Password [email or web address] 02/13/2017, 10:35 AM

## 2017-02-13 NOTE — Progress Notes (Signed)
Pt left unit AMA. Upon pt's initial request to leave, this RN educated pt about importance of remaining in this Hospital until completion of antibiotic therapy and its benefits to treating her illness; but pt still refused asking this writer to leave her alone. Pt requested for picc line to be discontinued and to leave. Dr. Cruzita Lederer made aware. Dr. Cruzita Lederer came up and spoke with pt and mother. Pt remained adamant about leaving and continued to request to have line out and leave. IV team notified of picc line removal. Line removed by IV team RN.. Pt signed AMA document. Mother at bedside and got into an argument with pt about leaving. Mom wanted pt to remain in the Hospital;but  pt insisted to leave . Mom was mad at pt and threatened to call the police to arrest pt. as pt has a warrant out there for arrest. After signing AMA document, pt along with mom left floor for greater than 20 minutes.upon returning to unit, Mom came up to this writer mentioning that pt had changed her mind about leaving and that pt had passed out on the elevators and so she needed to stay. Pt resting quietly in room at the time. AC and MD made aware. Pt advised to return to ER for readmission. Pt and mother left unit arguing leaving. Left ambulatorily and in stable condition. prescription for keflex given upon signing of AMA document.  Hale Bogus.

## 2017-02-13 NOTE — Discharge Summary (Signed)
Physician Discharge Summary  Sami Roes TMH:[id number] DOB: December 03, 1985 DOA: 01/27/2017  PCP: Sherri Francis, No Pcp Per  Admit date: 01/27/2017 Discharge date: 02/13/2017  Admitted From: home Disposition:  Left AGAINST MEDICAL ADVICE  Discharge Condition: guarded  CODE STATUS: Full code  HPI: Per Dr. Sheran Fava Chief Complaint: called to return due to positive blood cultures HPI: Sherri Francis is a 31 y.o. female with history of IVDA, hepatitis C, alcohol use, tobacco abuse, depression/anxiety with suicide attempts who presented to the ER on 8/19 with about a week of fever and cough.  She was found to have bilateral pneumonia and was given vancomycin and ceftriaxone.  She was going to be admitted to the hospital, but she left against medical advice because her withdrawal symptoms were severe.  She was called back on the same date because 2/2 blood cultures were positive for MSSA and advised to come back to the hospital.  She returned to the ER on 8/22.  She lives with her boyfriend who is also an IVDA in a hotel and neither of them have jobs.  She has been using less heroin over the last week she thinks and she last used 2 days ago.  She has had symptoms of withdrawal including feeling fevering, having chills, abdominal pain, body aches 9/10 throughout, nausea, vomiting, and diarrhea.  She has right flank/rib pains that are pleuritic.  10/10, worse with deep breaths.  Massaging area did not help.   ED Course: Vital signs stable.  Labs:  WBC 14.4, lactic acid 2.07.  Hemoglobin 9.4 g/dl, potassium 3.    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 Recommending IV Nafcillin through 9/13. Given IVDU, complex social issues and unable to place in SNF, continue inpatient treatment until end date -Blood cultures 8/22 with MSSA -Blood cultures 8/23, 8/24, 8/25: No growth - On 9/8 Sherri Francis at this time expresses desire to leave the Hospital immidiately, Sherri Francis has been warned that this is not Medically advisable at this time, and can result in Medical complications like Death and Disability, Sherri Francis understands and accepts the risks involved and assumes full responsibilty of this decision. -Personally discussed with Sherri Francis on 9/8, she does not want to stay in anymore, she is alert and oriented 4, denies any suicidal or homicidal ideation.  During this hospitalization she was also seen by psychiatry who did not recommend inpatient psychiatric help Chest pain -Sherri Francis complaint of chest pain last night, cardiac enzymes were cycled and they have remained negative.  Chest pain has now resolved.  D-dimer was checked and was found to be elevated.  Given d-dimer elevation, the fact that Sherri Francis has been refusing her subcutaneous Lovenox, underwent a CT angiogram today which was negative for PE RUQ pain with nausea, recurrent, secondary to PID -LFTs wnl -lipase 24 -x-ray abdomen was recently ordered and reviewed. No acute findings on imaging. -abd soft, pos BS. Given continued symptoms, ordered CT abd/pelvis. Reviewed. 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 Dr. Wyline Copas discussed case with on call GYN who recommends addition of doxycycline 100mg  bid to treat PID  IVDA with heroin withdrawal -Sherri Francis had been continued on suboxone, taper by 4 mg every 5 days -SW consulted -UDS positive for opiates -HIV NR -taper dose of suboxone will be completed on 9/14 -Concern for Sherri Francis actively using in the hospital his boyfriend has  been visiting more frequently.  Discussed with RN to closely monitor and cap PICC line when not in use.  Checked a urine drug screen which returned negative Reported distant ETOH use -ETOH level noted to be negative -without evidence of ETOH withdrawals -Remainsstable Iron deficiency vs. Anemia of chronic disease -had stopped iron supplementation due to nausea and vomiting Hepatitis C  -LFTs wnl -hep C log10 5.748/ 560,000 IU/mL, genotype 1a -Pt would benefit from close follow-up with hepatology and/or infectious disease at time of discharge Depression/anxiety -Started on 8/26 prozac -Gabapentin prn anxiety -avoid benzodiazepines -Remains stable   Discharge Instructions   Allergies as of 02/13/2017   No Known Allergies   Med Rec must be completed prior to using this [facility]      Discharge Care Instructions        Start     Ordered   02/11/17 0000  cephALEXin (KEFLEX) 500 MG capsule  2 times daily     02/11/17 1133      No Known Allergies  Consultations:  ID  Psychiatry  Procedures/Studies:  Dg Chest 2 View  Result Date: 01/24/2017 CLINICAL DATA:  Fever EXAM: CHEST  2 VIEW COMPARISON:  None. FINDINGS: Patchy opacity at the left base and right perihilar/ basal lung. No edema, effusion, or pneumothorax. Normal heart size. No acute osseous finding. IMPRESSION: Bilateral pneumonia. Electronically Signed   By: Monte Fantasia M.D.   On: 01/24/2017 08:18   Ct Chest W Contrast  Result Date: 01/27/2017 CLINICAL DATA:  Cough and fever. Bacteremia. IV drug abuse. Abnormal chest x-ray. EXAM: CT CHEST WITH CONTRAST TECHNIQUE: Multidetector CT imaging of the chest was performed during intravenous contrast administration. CONTRAST:  4mL ISOVUE-300 IOPAMIDOL (ISOVUE-300) INJECTION 61% COMPARISON:  Chest radiograph 01/24/2017 FINDINGS: Cardiovascular: The heart is normal in size. No pericardial effusion. Thoracic aorta is normal in caliber. Mediastinum/Nodes: Sherri Francis motion  artifact limits assessment, Sherri Francis had difficulty tolerating the exam. Enlarged right hilar node measures 10 mm. Prominent right infrahilar node measures 8 mm. There is distal esophageal wall thickening. Visualized thyroid gland is normal. Lungs/Pleura: Multiple bilateral pulmonary nodules, many of which are cavitary consistent with septic emboli. Adjacent lesions in the right upper lobe measure 1.7 and 2.3 cm respectively. Right lower lobe cavitary lesion measures 3.1 cm. Confluent and ground-glass opacity with early cavitation in the lateral segment of the right lower lobe. Cavitary left lower lobe nodule measuring 2.2 x 1.9 cm abuts the pleura. Additional cavitary and non cavitary nodules in both lungs. Minimal right pleural thickening/ fluid. Upper Abdomen: Cyst in the right kidney. No evidence of acute abnormality. Musculoskeletal: Motion artifact limits detailed assessment. There are no acute or suspicious osseous abnormalities. IMPRESSION: 1. Innumerable bilateral cavitary nodules consistent with septic emboli, right greater than left. Minimal right pleural thickening/fluid. 2. Mild right hilar adenopathy is likely reactive. Electronically Signed   By: Jeb Levering M.D.   On: 01/27/2017 23:11   Ct Angio Chest Pe W Or Wo Contrast  Result Date: 02/13/2017 CLINICAL DATA:  Known septic pulmonary emboli. Known tricuspid valve vegetation. History of IV drug abuse. Fever and cough. EXAM: CT ANGIOGRAPHY CHEST WITH CONTRAST TECHNIQUE: Multidetector CT imaging of the chest was performed using the standard protocol during bolus administration  of intravenous contrast. Multiplanar CT image reconstructions and MIPs were obtained to evaluate the vascular anatomy. CONTRAST:  100 mL of Isovue 370 COMPARISON:  Chest x-ray January 24, 2017.  Chest CT January 27, 2017. FINDINGS: Cardiovascular: The heart size is normal. The reported tricuspid valve vegetation is not seen on CT imaging. No coronary artery calcifications are  noted. The thoracic aorta is normal in caliber with no dissection. No pulmonary emboli. Mediastinum/Nodes: The prominent right hilar nodes seen previously measures 10 mm, unchanged. No new adenopathy. The esophagus and thyroid are normal. No effusions. A mildly prominent node containing calcifications in the right axilla stable, likely from previous granulomatous infection. Lungs/Pleura: The central airways are within normal limits. No pneumothorax. Numerous cavitary nodules are again identified consistent with history of septic emboli. However, there has been improvement and the numerous nodules are smaller in the interval and the peripheral walls of these nodules are thinner. A few of the nodules have resolved. No new nodules identified. No new infiltrate. Upper Abdomen: No acute abnormality. Musculoskeletal: No chest wall abnormality. No acute or significant osseous findings. Review of the MIP images confirms the above findings. IMPRESSION: 1. Persistent but improving cavitary nodules in the lungs consistent with septic emboli. 2. No pulmonary embolus identified. 3. No other acute abnormalities.  Reactive node in the right hilum. Electronically Signed   By: Dorise Bullion III M.D   On: 02/13/2017 08:57   US Abdomen Complete  Result Date: 02/13/2017 CLINICAL DATA:  Pain EXAM: ABDOMEN ULTRASOUND COMPLETE COMPARISON:  None. FINDINGS: Gallbladder: No gallstones or wall thickening visualized. No sonographic Murphy sign noted by sonographer. Common bile duct: Diameter: 3 mm Liver: No focal lesion identified. Within normal limits in parenchymal echogenicity. Portal vein is patent on color Doppler imaging with normal direction of blood flow towards the liver. IVC: No abnormality visualized. Pancreas: Visualized portion unremarkable. Spleen: Size and appearance within normal limits. Right Kidney: Length: 11.3 cm.  12 mm cyst. Left Kidney: Length: 11 cm. Echogenicity within normal limits. No mass or hydronephrosis  visualized. Abdominal aorta: No aneurysm visualized. Other findings: None. IMPRESSION: 1. No acute abnormality. Electronically Signed   By: Dorise Bullion III M.D   On: 02/13/2017 11:16   Ct Abdomen Pelvis W Contrast  Result Date: 02/07/2017 CLINICAL DATA:  Acute nausea and non bilious vomiting. History of IV drug abuse. Inpatient. EXAM: CT ABDOMEN AND PELVIS WITH CONTRAST TECHNIQUE: Multidetector CT imaging of the abdomen and pelvis was performed using the standard protocol following bolus administration of intravenous contrast. CONTRAST:  < 75 cc > ISOVUE-300 IOPAMIDOL (ISOVUE-300) INJECTION 61% COMPARISON:  01/31/2017 right upper quadrant abdominal sonogram. 01/13/2016 CT abdomen/pelvis. FINDINGS: Lower chest: Small dependent right pleural effusion, increased. Numerous irregular thick walled cavitary nodules throughout both lung bases, which generally demonstrate increased cavitation and decreased wall thickening since 01/27/2017 chest CT. For example, a 2.2 cm peripheral left lower lobe cavitary nodule (series 6/ image 13) is a mildly decreased from 2.2 cm and demonstrates decreased wall thickness. A 3.0 cm peripheral right lower lobe cavitary nodule (series 6/ image 3) is decreased from 3.7 cm and demonstrates decreased wall thickness and increased cavitation. A 3.8 cm cavitary nodule in the basilar right lower lobe (series 6/image 14) is mildly increased from 3.5 cm, although demonstrates increased cavitation and decreased wall thickness. Hepatobiliary: Normal liver with no liver mass. Normal gallbladder with no radiopaque cholelithiasis. No biliary ductal dilatation. Common bile duct diameter 5 mm. Pancreas: Normal, with no mass or duct dilation. Spleen:  Normal size. No mass. Adrenals/Urinary Tract: Normal adrenals. Hypodense 1.0 cm lateral upper right renal cortical lesion with density 36 HU (series 2/ image 22), stable in size since 01/13/2016, suggesting a benign renal cyst. Additional tiny  subcentimeter hypodense lower right and upper left renal cortical lesions are too small to characterize and also stable since 01/13/2016, suggesting benign renal cysts. No new renal lesions. No hydronephrosis. Normal bladder. Stomach/Bowel: Grossly normal stomach. Normal caliber small bowel with no small bowel wall thickening. Normal appendix. Normal large bowel with no diverticulosis, large bowel wall thickening or pericolonic fat stranding. Oral contrast progresses to the distal transverse colon. Vascular/Lymphatic: Normal caliber abdominal aorta. Patent portal, splenic, hepatic and renal veins. No pathologically enlarged lymph nodes in the abdomen or pelvis. Reproductive: Grossly normal uterus. 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 No right adnexal mass. Generalized haziness of the pelvic fat. Other: No pneumoperitoneum, ascites or focal fluid collection. Musculoskeletal: No aggressive appearing focal osseous lesions. IMPRESSION: 1. Numerous cavitary septic emboli at the lung bases, which generally demonstrate decreased wall thickness and increased cavitation since 01/27/2017 chest CT, suggesting treatment response. Small dependent right pleural effusion is mildly increased. Continued post treatment chest imaging follow-up recommended. 2. No evidence of bowel obstruction or acute bowel inflammation. Normal appendix . 3. 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 Nonspecific generalized haziness of the pelvic fat, which could be a sign of pelvic inflammatory disease versus potentially an inflammatory response to leaking left ovarian teratoma. No pelvic abscess. GYN consultation advised for consideration of resection of the teratoma given the size and growth. Electronically Signed   By: Ilona Sorrel M.D.   On: 02/07/2017 17:22   Dg Abd Portable 1v  Result Date:  02/03/2017 CLINICAL DATA:  Nausea.  Vomiting.  Constipation. EXAM: PORTABLE ABDOMEN - 1 VIEW COMPARISON:  Abdominal ultrasound of 01/31/2017. FINDINGS: Two supine views. No dilated bowel loops. No abnormal abdominal calcifications. No appendicolith. Right hemidiaphragm elevation. Suspect residual right lower lobe airspace disease. IMPRESSION: No acute findings. Electronically Signed   By: Abigail Miyamoto M.D.   On: 02/03/2017 11:18   US Abdomen Limited Ruq  Result Date: 01/31/2017 CLINICAL DATA:  RIGHT upper quadrant pain with vomiting. EXAM: ULTRASOUND ABDOMEN LIMITED RIGHT UPPER QUADRANT COMPARISON:  01/27/2017. FINDINGS: Gallbladder: No gallstones or wall thickening visualized. No sonographic Murphy sign noted by sonographer. Common bile duct: Diameter: 4 mm, within normal limits. Liver: No focal lesion identified. Within normal limits in parenchymal echogenicity. Portal vein is patent on color Doppler imaging with normal direction of blood flow towards the liver. Compared with prior, no significant change. IMPRESSION: Negative RIGHT upper quadrant ultrasound. No cause seen for the reported symptoms. Electronically Signed   By: Staci Righter M.D.   On: 01/31/2017 14:20   US Abdomen Limited Ruq  Result Date: 01/27/2017 CLINICAL DATA:  Acute right upper quadrant abdominal pain. EXAM: ULTRASOUND ABDOMEN LIMITED RIGHT UPPER QUADRANT COMPARISON:  CT scan of January 13, 2016. FINDINGS: Gallbladder: No gallstones or wall thickening visualized. No sonographic Murphy sign noted by sonographer. Gallbladder does appear to be contracted. Common bile duct: Diameter: 2 mm which is within normal limits. Liver: No focal lesion identified. Within normal limits in parenchymal echogenicity. Portal vein is patent on color Doppler imaging with normal direction of blood flow towards the liver. IMPRESSION: No abnormality seen in the right upper quadrant  of the abdomen. Electronically Signed   By: Marijo Conception, M.D.   On:  01/27/2017 17:39     Subjective: - no chest pain, shortness of breath, no abdominal pain, nausea or vomiting.   Discharge Exam: Vitals:   02/12/17 1628 02/12/17 2042  BP: (!) 142/110 124/86  Pulse: 100 (!) 101  Resp: 16 16  Temp: 98.3 F (36.8 C) 98.6 F (37 C)  SpO2:  100%   Vitals:   02/12/17 1034 02/12/17 1430 02/12/17 1628 02/12/17 2042  BP: (!) 144/94 140/88 (!) 142/110 124/86  Pulse: (!) 105 98 100 (!) 101  Resp: 16 16 16 16   Temp: 97.8 F (36.6 C) 97.8 F (36.6 C) 98.3 F (36.8 C) 98.6 F (37 C)  TempSrc: Oral Oral Oral Oral  SpO2:    100%  Weight:      Height:        General: Pt is alert, awake, not in acute distress Cardiovascular: RRR, S1/S2 +, no rubs, no gallops Respiratory: CTA bilaterally, no wheezing, no rhonchi Abdominal: Soft, NT, ND, bowel sounds + Extremities: no edema, no cyanosis    The results of significant diagnostics from this hospitalization (including imaging, microbiology, ancillary and laboratory) are listed below for reference.     Microbiology: No results found for this or any previous visit (from the past 240 hour(s)).   Labs: BNP (last 3 results) No results for input(s): BNP in the last 8760 hours. Basic Metabolic Panel:  Recent Labs Lab 02/08/17 0511 02/12/17 0409  NA 137 135  K 4.0 3.1*  CL 101 97*  CO2 28 28  GLUCOSE 103* 104*  BUN 8 7  CREATININE 0.45 0.59  CALCIUM 8.7* 8.9   Liver Function Tests:  Recent Labs Lab 02/08/17 0511 02/12/17 0409  AST 15 15  ALT 14 14  ALKPHOS 55 64  BILITOT 0.4 1.0  PROT 7.3 7.9  ALBUMIN 2.7* 2.9*   No results for input(s): LIPASE, AMYLASE in the last 168 hours. No results for input(s): AMMONIA in the last 168 hours. CBC:  Recent Labs Lab 02/08/17 1029 02/12/17 0409  WBC 10.9* 6.7  HGB 10.9* 11.3*  HCT 34.2* 35.4*  MCV 84.0 83.7  PLT 844* 725*   Cardiac Enzymes:  Recent Labs Lab 02/12/17 2053 02/13/17 0401 02/13/17 0946  TROPONINI <0.03 <0.03 <0.03    BNP: Invalid input(s): POCBNP CBG: No results for input(s): GLUCAP in the last 168 hours. D-Dimer  Recent Labs  02/13/17 0401  DDIMER 0.70*   Hgb A1c No results for input(s): HGBA1C in the last 72 hours. Lipid Profile No results for input(s): CHOL, HDL, LDLCALC, TRIG, CHOLHDL, LDLDIRECT in the last 72 hours. Thyroid function studies No results for input(s): TSH, T4TOTAL, T3FREE, THYROIDAB in the last 72 hours.  Invalid input(s): FREET3 Anemia work up No results for input(s): VITAMINB12, FOLATE, FERRITIN, TIBC, IRON, RETICCTPCT in the last 72 hours. Urinalysis    Component Value Date/Time   COLORURINE YELLOW 02/11/2017 1238   APPEARANCEUR CLOUDY (A) 02/11/2017 1238   LABSPEC 1.020 02/11/2017 1238   PHURINE 6.0 02/11/2017 1238   GLUCOSEU NEGATIVE 02/11/2017 1238   HGBUR NEGATIVE 02/11/2017 1238   BILIRUBINUR NEGATIVE 02/11/2017 1238   KETONESUR 5 (A) 02/11/2017 1238   PROTEINUR NEGATIVE 02/11/2017 1238   UROBILINOGEN 0.2 10/15/2012 1341   NITRITE NEGATIVE 02/11/2017 1238   LEUKOCYTESUR SMALL (A) 02/11/2017 1238   Sepsis Labs Invalid input(s): PROCALCITONIN,  WBC,  LACTICIDVEN Microbiology No results found for this or any previous  visit (from the past 240 hour(s)).   Time coordinating discharge: 15 minutes  SIGNED:  Marzetta Board, MD  [facility] 02/13/2017, 1:04 PM Pager [phone]  If 7PM-7AM, please contact night-coverage [email or web address] Password [email or web address]

## 2017-02-14 LAB — URINE CULTURE: Culture: <10000 — AB

## 2017-02-20 ENCOUNTER — Encounter (HOSPITAL_COMMUNITY): Payer: Self-pay | Admitting: Nurse Practitioner

## 2017-02-20 ENCOUNTER — Inpatient Hospital Stay (HOSPITAL_COMMUNITY): Payer: Self-pay

## 2017-02-20 ENCOUNTER — Emergency Department (HOSPITAL_COMMUNITY): Payer: Self-pay

## 2017-02-20 ENCOUNTER — Inpatient Hospital Stay (HOSPITAL_COMMUNITY)
Admission: EM | Admit: 2017-02-20 | Discharge: 2017-02-23 | DRG: 871 | Discharge status: Against Medical Advice | Payer: Self-pay | Attending: Internal Medicine | Admitting: Internal Medicine

## 2017-02-20 DIAGNOSIS — F1114 Opioid abuse with opioid-induced mood disorder: Secondary | ICD-10-CM

## 2017-02-20 DIAGNOSIS — Z8249 Family history of ischemic heart disease and other diseases of the circulatory system: Secondary | ICD-10-CM

## 2017-02-20 DIAGNOSIS — E876 Hypokalemia: Secondary | ICD-10-CM | POA: Diagnosis present

## 2017-02-20 DIAGNOSIS — Z915 Personal history of self-harm: Secondary | ICD-10-CM

## 2017-02-20 DIAGNOSIS — F1120 Opioid dependence, uncomplicated: Secondary | ICD-10-CM | POA: Diagnosis present

## 2017-02-20 DIAGNOSIS — D509 Iron deficiency anemia, unspecified: Secondary | ICD-10-CM

## 2017-02-20 DIAGNOSIS — F419 Anxiety disorder, unspecified: Secondary | ICD-10-CM | POA: Diagnosis present

## 2017-02-20 DIAGNOSIS — F1123 Opioid dependence with withdrawal: Secondary | ICD-10-CM | POA: Diagnosis present

## 2017-02-20 DIAGNOSIS — I76 Septic arterial embolism: Secondary | ICD-10-CM | POA: Diagnosis present

## 2017-02-20 DIAGNOSIS — Z9119 Patient's noncompliance with other medical treatment and regimen: Secondary | ICD-10-CM

## 2017-02-20 DIAGNOSIS — R509 Fever, unspecified: Secondary | ICD-10-CM | POA: Diagnosis present

## 2017-02-20 DIAGNOSIS — J189 Pneumonia, unspecified organism: Secondary | ICD-10-CM | POA: Diagnosis present

## 2017-02-20 DIAGNOSIS — D649 Anemia, unspecified: Secondary | ICD-10-CM | POA: Diagnosis present

## 2017-02-20 DIAGNOSIS — Z811 Family history of alcohol abuse and dependence: Secondary | ICD-10-CM

## 2017-02-20 DIAGNOSIS — I330 Acute and subacute infective endocarditis: Secondary | ICD-10-CM | POA: Diagnosis present

## 2017-02-20 DIAGNOSIS — Z86711 Personal history of pulmonary embolism: Secondary | ICD-10-CM

## 2017-02-20 DIAGNOSIS — A419 Sepsis, unspecified organism: Principal | ICD-10-CM | POA: Diagnosis present

## 2017-02-20 DIAGNOSIS — F1420 Cocaine dependence, uncomplicated: Secondary | ICD-10-CM | POA: Diagnosis present

## 2017-02-20 DIAGNOSIS — Z9114 Patient's other noncompliance with medication regimen: Secondary | ICD-10-CM

## 2017-02-20 DIAGNOSIS — F17210 Nicotine dependence, cigarettes, uncomplicated: Secondary | ICD-10-CM | POA: Diagnosis present

## 2017-02-20 DIAGNOSIS — B182 Chronic viral hepatitis C: Secondary | ICD-10-CM | POA: Diagnosis present

## 2017-02-20 HISTORY — DX: Endocarditis, valve unspecified: I38

## 2017-02-20 HISTORY — DX: Acute parametritis and pelvic cellulitis: N73.0

## 2017-02-20 HISTORY — DX: Neoplasm of uncertain behavior, unspecified: D48.9

## 2017-02-20 LAB — COMPREHENSIVE METABOLIC PANEL
ALBUMIN: 2.7 g/dL — AB (ref 3.5–5.0)
ALK PHOS: 65 U/L (ref 38–126)
ALT: 17 U/L (ref 14–54)
ANION GAP: 7 (ref 5–15)
AST: 19 U/L (ref 15–41)
BILIRUBIN TOTAL: 0.4 mg/dL (ref 0.3–1.2)
BUN: 6 mg/dL (ref 6–20)
CALCIUM: 8.2 mg/dL — AB (ref 8.9–10.3)
CO2: 26 mmol/L (ref 22–32)
CREATININE: 0.46 mg/dL (ref 0.44–1.00)
Chloride: 107 mmol/L (ref 101–111)
GFR calc non Af Amer: >60 mL/min (ref >60)
GLUCOSE: 101 mg/dL — AB (ref 65–99)
Potassium: 3.3 mmol/L — ABNORMAL LOW (ref 3.5–5.1)
SODIUM: 140 mmol/L (ref 135–145)
TOTAL PROTEIN: 6.7 g/dL (ref 6.5–8.1)

## 2017-02-20 LAB — CBC WITH DIFFERENTIAL/PLATELET
BASOS ABS: 0 10*3/uL (ref 0.0–0.1)
BASOS PCT: 0 %
EOS ABS: 0.4 10*3/uL (ref 0.0–0.7)
Eosinophils Relative: 5 %
HCT: 29.9 % — ABNORMAL LOW (ref 36.0–46.0)
Hemoglobin: 9.6 g/dL — ABNORMAL LOW (ref 12.0–15.0)
Lymphocytes Relative: 47 %
Lymphs Abs: 3.9 10*3/uL (ref 0.7–4.0)
MCH: 27 pg (ref 26.0–34.0)
MCHC: 32.1 g/dL (ref 30.0–36.0)
MCV: 84 fL (ref 78.0–100.0)
MONO ABS: 1.1 10*3/uL — AB (ref 0.1–1.0)
Monocytes Relative: 13 %
Neutro Abs: 2.9 10*3/uL (ref 1.7–7.7)
Neutrophils Relative %: 35 %
PLATELETS: 471 10*3/uL — AB (ref 150–400)
RBC: 3.56 MIL/uL — ABNORMAL LOW (ref 3.87–5.11)
RDW: 16.8 % — AB (ref 11.5–15.5)
WBC: 8.3 10*3/uL (ref 4.0–10.5)

## 2017-02-20 LAB — URINALYSIS, ROUTINE W REFLEX MICROSCOPIC
Bilirubin Urine: NEGATIVE
GLUCOSE, UA: NEGATIVE mg/dL
Hgb urine dipstick: NEGATIVE
Ketones, ur: NEGATIVE mg/dL
Nitrite: NEGATIVE
Protein, ur: NEGATIVE mg/dL
Specific Gravity, Urine: 1.017 (ref 1.005–1.030)
pH: 6 (ref 5.0–8.0)

## 2017-02-20 LAB — I-STAT TROPONIN, ED: TROPONIN I, POC: 0 ng/mL (ref 0.00–0.08)

## 2017-02-20 MED ORDER — NAFCILLIN SODIUM 2 G IJ SOLR
2.0000 g | INTRAVENOUS | Status: DC
  Administered 2017-02-20 – 2017-02-23 (×16): 2 g via INTRAVENOUS
  Filled 2017-02-20 (×18): qty 2000

## 2017-02-20 MED ORDER — ACETAMINOPHEN 650 MG RE SUPP: 650.0000 mg | Freq: Four times a day (QID) | RECTAL | Status: DC | PRN

## 2017-02-20 MED ORDER — SODIUM CHLORIDE 0.9 % IV SOLN
INTRAVENOUS | Status: AC
  Administered 2017-02-20: 22:00:00 via INTRAVENOUS

## 2017-02-20 MED ORDER — NAFCILLIN SODIUM 2 G IJ SOLR
2.0000 g | Freq: Once | INTRAVENOUS | Status: AC
  Administered 2017-02-20: 2 g via INTRAVENOUS
  Filled 2017-02-20: qty 2000

## 2017-02-20 MED ORDER — LORAZEPAM 2 MG/ML IJ SOLN
1.0000 mg | INTRAMUSCULAR | Status: DC | PRN
  Administered 2017-02-20 – 2017-02-21 (×3): 1 mg via INTRAVENOUS
  Filled 2017-02-20 (×3): qty 1

## 2017-02-20 MED ORDER — ENOXAPARIN SODIUM 40 MG/0.4ML ~~LOC~~ SOLN
40.0000 mg | Freq: Every day | SUBCUTANEOUS | Status: DC
  Filled 2017-02-20 (×3): qty 0.4

## 2017-02-20 MED ORDER — ACETAMINOPHEN 325 MG PO TABS: 650.0000 mg | ORAL_TABLET | Freq: Four times a day (QID) | ORAL | Status: DC | PRN

## 2017-02-20 NOTE — H&P (Signed)
TRH H&P   Patient Demographics:    Sherri Francis, is a 31 y.o. female  MRN: [id number]   DOB - September 08, 1985  Admit Date - 02/20/2017  Outpatient Primary MD for the patient is Patient, No Pcp Per NO PCP  Referring MD/NP/PA:  Tamera Punt  Outpatient Specialists:  Carlyle Basques (ID)  Patient coming from:  home  Chief Complaint  Patient presents with  . Requesting Re-Admittion  . Endocarditis      HPI:    Sherri Francis  is a 31 y.o. 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  ID was consulted and evaluated the patient recommending nafcillin thru 9/13,  Pt apparently left AMA on 9/8.   She represents to ED due to fever (subjective), and soaking wet sheets.  Pt was concerned that she might have endocarditis and therefore came to ED for evaluation.   In ED,  Wbc 8.3, blood culture x 2 pending,  K =3.3, CXR=> Continued nodules in the right lung, 2 of which are cavitary. No other definitive nodules identified today. The findings are consistent with known septic emboli. Overall, there has been interval improvement.  Pt will be admitted for fever, r/o endocarditis.      Review of systems:    In addition to the HPI above,   No Headache, No changes with Vision or hearing, No problems swallowing food or Liquids, No Chest pain,  No Abdominal pain, No Nausea or Vommitting, Bowel movements are regular, No Blood in stool or Urine, No dysuria, No new skin rashes or bruises, No new joints pains-aches,  No new weakness, tingling, numbness in any extremity, No recent weight gain or loss, No polyuria, polydypsia or polyphagia, No significant Mental  Stressors.  A full 10 point Review of Systems was done, except as stated above, all other Review of Systems were negative.   With Past History of the following :    Past Medical History:  Diagnosis Date  . Anemia   . Anxiety   . Depression   . Endocarditis   . Hepatitis C   . IVDU (intravenous drug user)   . PID (acute pelvic inflammatory disease)   . Suicide attempt [facility])    multiple attempts  . Teratoma   . UTI (urinary tract infection)       Past Surgical History:  Procedure Laterality Date  . LAPAROSCOPY        Social History:     Social History  Substance Use Topics  . Smoking status: Current Every Day Smoker    Packs/day: 0.50    Types: Cigarettes  . Smokeless tobacco: Never Used  . Alcohol use Yes     Comment:  6 beers perday- 2 years ago was last time hx of alcoholism per pt.     Lives - at home  Mobility - walks by self  Family History :     Family History  Problem Relation Age of Onset  . Heart disease Maternal Grandfather   . Anxiety disorder Maternal Grandfather   . Heart disease Maternal Aunt   . Alcohol abuse Father   . Drug abuse Father   . Cancer Neg Hx       Home Medications:   Prior to Admission medications   Medication Sig Start Date End Date Taking? Authorizing Provider  cephALEXin (KEFLEX) 500 MG capsule Take 2 capsules (1,000 mg total) by mouth 2 (two) times daily. 02/11/17 03/04/17 Yes Leatha Gilding, MD     Allergies:    No Known Allergies   Physical Exam:   Vitals  Blood pressure 121/83, pulse (!) 101, temperature 98.1 F (36.7 C), temperature source Oral, resp. rate 15, height 5\' 1"  (1.549 m), weight 45.4 kg (100 lb), last menstrual period 02/13/2017, SpO2 99 %.   1. General  lying in bed in NAD,   2. Normal affect and insight, Not Suicidal or Homicidal, Awake Alert, Oriented X 3.  3. No F.N deficits, ALL C.Nerves Intact, Strength 5/5 all 4 extremities, Sensation intact all 4 extremities, Plantars down  going.  4. Ears and Eyes appear Normal, Conjunctivae clear, PERRLA. Moist Oral Mucosa.  5. Supple Neck, No JVD, No cervical lymphadenopathy appriciated, No Carotid Bruits.  6. Symmetrical Chest wall movement, Good air movement bilaterally, CTAB.  7. Tachycardic s1, s2  8. Positive Bowel Sounds, Abdomen Soft, No tenderness, No organomegaly appriciated,No rebound -guarding or rigidity.  9.  No Cyanosis, Normal Skin Turgor, No Skin Rash or Bruise.  10. Good muscle tone,  joints appear normal , no effusions, Normal ROM.  11. No Palpable Lymph Nodes in Neck or Axillae  No janeway, no osler, no splinter   Data Review:    CBC  Recent Labs Lab 02/20/17 1720  WBC 8.3  HGB 9.6*  HCT 29.9*  PLT 471*  MCV 84.0  MCH 27.0  MCHC 32.1  RDW 16.8*  LYMPHSABS 3.9  MONOABS 1.1*  EOSABS 0.4  BASOSABS 0.0   ------------------------------------------------------------------------------------------------------------------  Chemistries   Recent Labs Lab 02/20/17 1720  NA 140  K 3.3*  CL 107  CO2 26  GLUCOSE 101*  BUN 6  CREATININE 0.46  CALCIUM 8.2*  AST 19  ALT 17  ALKPHOS 65  BILITOT 0.4   ------------------------------------------------------------------------------------------------------------------ estimated creatinine clearance is 74.4 mL/min (by C-G formula based on SCr of 0.46 mg/dL). ------------------------------------------------------------------------------------------------------------------ No results for input(s): TSH, T4TOTAL, T3FREE, THYROIDAB in the last 72 hours.  Invalid input(s): FREET3  Coagulation profile No results for input(s): INR, PROTIME in the last 168 hours. ------------------------------------------------------------------------------------------------------------------- No results for input(s): DDIMER in the last 72  hours. -------------------------------------------------------------------------------------------------------------------  Cardiac Enzymes No results for input(s): CKMB, TROPONINI, MYOGLOBIN in the last 168 hours.  Invalid input(s): CK ------------------------------------------------------------------------------------------------------------------ No results found for: BNP   ---------------------------------------------------------------------------------------------------------------  Urinalysis    Component Value Date/Time   COLORURINE YELLOW 02/11/2017 1238   APPEARANCEUR CLOUDY (A) 02/11/2017 1238   LABSPEC 1.020 02/11/2017 1238   PHURINE 6.0 02/11/2017 1238   GLUCOSEU NEGATIVE 02/11/2017 1238   HGBUR NEGATIVE 02/11/2017 1238   BILIRUBINUR NEGATIVE 02/11/2017 1238   KETONESUR 5 (A) 02/11/2017 1238   PROTEINUR NEGATIVE 02/11/2017 1238   UROBILINOGEN 0.2 10/15/2012 1341   NITRITE NEGATIVE 02/11/2017 1238  LEUKOCYTESUR SMALL (A) 02/11/2017 1238    ----------------------------------------------------------------------------------------------------------------   Imaging Results:    Dg Chest 2 View  Result Date: 02/20/2017 CLINICAL DATA:  Known endocarditis. EXAM: CHEST  2 VIEW COMPARISON:  January 24, 2017 chest x-ray and CT scan February 13, 2017 FINDINGS: At least 3 nodules remain on the right, 2 which are cavitary. No definitive nodules are seen on the left. Overall, there has been interval improvement. No pneumothorax. The cardiomediastinal silhouette is stable. IMPRESSION: Continued nodules in the right lung, 2 of which are cavitary. No other definitive nodules identified today. The findings are consistent with known septic emboli. Overall, there has been interval improvement. Electronically Signed   By: Dorise Bullion III M.D   On: 02/20/2017 17:08    ST at 110, nl axis, nl int, no st-t changes c/w ischemia   Assessment & Plan:    Principal Problem:   Fever Active  Problems:   Opioid type dependence, continuous (HCC)   Anemia    Subjective Fever Hx of endocarditis Tricuspid valve Blood culture x2 ESR Check echo Restart nafcillin   Tachycardia likely due to withdrawal Trop I q6h x3 Check tsh Check echo  Anemia Repeat cbc in am  Hypokalemia Replete Check cmp in am     DVT Prophylaxis Lovenox - SCDs   AM Labs Ordered, also please review Full Orders  Family Communication: Admission, patients condition and plan of care including tests being ordered have been discussed with the patient  who indicate understanding and agree with the plan and Code Status.  Code Status FULL CODE  Likely DC to  home  Condition GUARDED   Consults called: please consult ID in am  Admission status: inpatient  Time spent in minutes :45    Jani Gravel M.D on 02/20/2017 at 7:35 PM  Between 7am to 7pm - Pager - [phone] . After 7pm go to [email or web address] - password [facility]  [facility] - Office  [phone]

## 2017-02-20 NOTE — ED Provider Notes (Signed)
[facility] DEPT Provider Note   CSN: [id number] Arrival date & time: 02/20/17  1535     History   Chief Complaint Chief Complaint  Patient presents with  . Requesting Re-Admittion  . Endocarditis    HPI Sherri Francis is a 31 y.o. female.  Patient is a 31 year old female with a history of heroin abuse who was recently admitted with a diagnosis of MSSA endocarditis of the tricuspid valve as well as septic emboli to the lungs and liver. Infectious disease recommended nafcillin. She is up leaving AMA on September 8. Since that time she does admit to continued use of heroin. She states her last injection was this morning. She had subjective fevers yesterday. She feels short of breath. She also has some intermittent tightness to the center of her chest which she treats to anxiety. She has a mild nonproductive cough. No vomiting. No wounds.      Past Medical History:  Diagnosis Date  . Anemia   . Anxiety   . Depression   . Hepatitis C   . IVDU (intravenous drug user)   . Suicide attempt [facility])    multiple attempts  . UTI (urinary tract infection)     Patient Active Problem List   Diagnosis Date Noted  . Fever 02/20/2017  . Acute bacterial endocarditis   . Substance induced mood disorder ([place]) 01/30/2017  . Opioid type dependence, continuous ([place]) 01/30/2017  . 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 Bacteremia due to Staphylococcus aureus 01/27/2017  . Pneumonia 01/24/2017  . Pelvic abscess in female 01/13/2016  . PID (acute pelvic inflammatory disease) 01/13/2016    Past Surgical History:  Procedure Laterality Date  . LAPAROSCOPY      OB History    Gravida Para Term Preterm AB Living   0 0 0 0 0 0   SAB TAB Ectopic Multiple Live Births   0 0 0 0 0       Home Medications    Prior to Admission medications   Medication Sig Start Date End Date Taking? Authorizing Provider    cephALEXin (KEFLEX) 500 MG capsule Take 2 capsules (1,000 mg total) by mouth 2 (two) times daily. 02/11/17 03/04/17 Yes Caren Griffins, MD    Family History Family History  Problem Relation Age of Onset  . Heart disease Maternal Grandfather   . Anxiety disorder Maternal Grandfather   . Heart disease Maternal Aunt   . Alcohol abuse Father   . Drug abuse Father   . Cancer Neg Hx     Social History Social History  Substance Use Topics  . Smoking status: Current Every Day Smoker    Packs/day: 0.50    Types: Cigarettes  . Smokeless tobacco: Never Used  . Alcohol use Yes     Comment: 6 beers perday- 2 years ago was last time hx of alcoholism per pt.     Allergies   Patient has no known allergies.   Review of Systems Review of Systems  Constitutional: Positive for fatigue and fever. Negative for chills and diaphoresis.  HENT: Negative for congestion, rhinorrhea and sneezing.   Eyes: Negative.   Respiratory: Positive for shortness of breath. Negative for cough and chest tightness.   Cardiovascular: Positive for chest pain. Negative for leg swelling.  Gastrointestinal: Negative for abdominal pain, blood in stool, diarrhea, nausea and vomiting.  Genitourinary: Negative for difficulty urinating, flank pain, frequency and hematuria.  Musculoskeletal: Negative for arthralgias  and back pain.  Skin: Negative for rash.  Neurological: Negative for dizziness, speech difficulty, weakness, numbness and headaches.     Physical Exam Updated Vital Signs BP 121/83   Pulse (!) 101   Temp 98.1 F (36.7 C) (Oral)   Resp 15   Ht 5\' 1"  (1.549 m)   Wt 45.4 kg (100 lb)   LMP 02/13/2017 (Approximate) Comment: negative pregnancy test result on admission.  SpO2 99%   BMI 18.89 kg/m   Physical Exam  Constitutional: She is oriented to person, place, and time. She appears well-developed and well-nourished.  HENT:  Head: Normocephalic and atraumatic.  Eyes: Pupils are equal, round, and  reactive to light.  Neck: Normal range of motion. Neck supple.  Cardiovascular: Normal rate, regular rhythm and normal heart sounds.   Pulmonary/Chest: Effort normal and breath sounds normal. No respiratory distress. She has no wheezes. She has no rales. She exhibits no tenderness.  Abdominal: Soft. Bowel sounds are normal. There is no tenderness. There is no rebound and no guarding.  Musculoskeletal: Normal range of motion. She exhibits no edema.  Lymphadenopathy:    She has no cervical adenopathy.  Neurological: She is alert and oriented to person, place, and time.  Skin: Skin is warm and dry. No rash noted.  Patient has track marks to her left arm without signs of infection   Psychiatric: She has a normal mood and affect.     ED Treatments / Results  Labs (all labs ordered are listed, but only abnormal results are displayed) Labs Reviewed  COMPREHENSIVE METABOLIC PANEL - Abnormal; Notable for the following:       Result Value   Potassium 3.3 (*)    Glucose, Bld 101 (*)    Calcium 8.2 (*)    Albumin 2.7 (*)    All other components within normal limits  CBC WITH DIFFERENTIAL/PLATELET - Abnormal; Notable for the following:    RBC 3.56 (*)    Hemoglobin 9.6 (*)    HCT 29.9 (*)    RDW 16.8 (*)    Platelets 471 (*)    Monocytes Absolute 1.1 (*)    All other components within normal limits  CULTURE, BLOOD (ROUTINE X 2)  CULTURE, BLOOD (ROUTINE X 2)  I-STAT TROPONIN, ED    EKG  EKG Interpretation  Date/Time:  Saturday February 20 2017 17:12:29 EDT Ventricular Rate:  107 PR Interval:    QRS Duration: 79 QT Interval:  336 QTC Calculation: 449 R Axis:   84 Text Interpretation:  Sinus tachycardia since last tracing no significant change Confirmed by Malvin Johns [phone]) on 02/20/2017 5:15:45 PM       Radiology Dg Chest 2 View  Result Date: 02/20/2017 CLINICAL DATA:  Known endocarditis. EXAM: CHEST  2 VIEW COMPARISON:  January 24, 2017 chest x-ray and CT scan  February 13, 2017 FINDINGS: At least 3 nodules remain on the right, 2 which are cavitary. No definitive nodules are seen on the left. Overall, there has been interval improvement. No pneumothorax. The cardiomediastinal silhouette is stable. IMPRESSION: Continued nodules in the right lung, 2 of which are cavitary. No other definitive nodules identified today. The findings are consistent with known septic emboli. Overall, there has been interval improvement. Electronically Signed   By: Dorise Bullion III M.D   On: 02/20/2017 17:08    Procedures Procedures (including critical care time)  Medications Ordered in ED Medications  nafcillin 2 g in dextrose 5 % 100 mL IVPB (0 g Intravenous Stopped 02/20/17  1817)     Initial Impression / Assessment and Plan / ED Course  I have reviewed the triage vital signs and the nursing notes.  Pertinent labs & imaging results that were available during my care of the patient were reviewed by me and considered in my medical decision making (see chart for details).     Pt was started on Nafcillin.  Spoke with Dr. Maudie Mercury who will admit the pt.  She has no hypoxia. No signs of acute worsening pneumonia. No evidence of sepsis.  Final Clinical Impressions(s) / ED Diagnoses   Final diagnoses:  Acute bacterial endocarditis    New Prescriptions New Prescriptions   No medications on file     Malvin Johns, MD 02/20/17 1901

## 2017-02-20 NOTE — ED Notes (Signed)
RN , may call report to Jenny Reichmann, [phone], at 7:45pm.  Sherri Francis. Brigitte Pulse, RN

## 2017-02-20 NOTE — ED Triage Notes (Signed)
Pt states she has been advised to come back to the hospital by "Crisis Control representative" to complete treatment for endorcarditis. Adds that's she was hospitalized for 2 weeks but left AMA before complete of IV Abx. States that she continues to use IVD, and is concerned about withdrawals. C/o chest soreness.

## 2017-02-20 NOTE — ED Notes (Signed)
Patient transported to CT 

## 2017-02-21 ENCOUNTER — Inpatient Hospital Stay (HOSPITAL_COMMUNITY): Payer: Self-pay

## 2017-02-21 DIAGNOSIS — F17210 Nicotine dependence, cigarettes, uncomplicated: Secondary | ICD-10-CM

## 2017-02-21 DIAGNOSIS — Z818 Family history of other mental and behavioral disorders: Secondary | ICD-10-CM

## 2017-02-21 DIAGNOSIS — Z811 Family history of alcohol abuse and dependence: Secondary | ICD-10-CM

## 2017-02-21 DIAGNOSIS — Z813 Family history of other psychoactive substance abuse and dependence: Secondary | ICD-10-CM

## 2017-02-21 DIAGNOSIS — F418 Other specified anxiety disorders: Secondary | ICD-10-CM

## 2017-02-21 DIAGNOSIS — R7881 Bacteremia: Secondary | ICD-10-CM

## 2017-02-21 DIAGNOSIS — F1114 Opioid abuse with opioid-induced mood disorder: Secondary | ICD-10-CM

## 2017-02-21 DIAGNOSIS — J189 Pneumonia, unspecified organism: Secondary | ICD-10-CM

## 2017-02-21 DIAGNOSIS — I361 Nonrheumatic tricuspid (valve) insufficiency: Secondary | ICD-10-CM

## 2017-02-21 LAB — COMPREHENSIVE METABOLIC PANEL
ALT: 18 U/L (ref 14–54)
ANION GAP: 6 (ref 5–15)
AST: 19 U/L (ref 15–41)
Albumin: 2.6 g/dL — ABNORMAL LOW (ref 3.5–5.0)
Alkaline Phosphatase: 71 U/L (ref 38–126)
BUN: 11 mg/dL (ref 6–20)
CHLORIDE: 108 mmol/L (ref 101–111)
CO2: 26 mmol/L (ref 22–32)
Calcium: 8 mg/dL — ABNORMAL LOW (ref 8.9–10.3)
Creatinine, Ser: 0.52 mg/dL (ref 0.44–1.00)
GFR calc non Af Amer: >60 mL/min (ref >60)
Glucose, Bld: 129 mg/dL — ABNORMAL HIGH (ref 65–99)
Potassium: 3.3 mmol/L — ABNORMAL LOW (ref 3.5–5.1)
SODIUM: 140 mmol/L (ref 135–145)
Total Bilirubin: 0.8 mg/dL (ref 0.3–1.2)
Total Protein: 6.3 g/dL — ABNORMAL LOW (ref 6.5–8.1)

## 2017-02-21 LAB — RAPID URINE DRUG SCREEN, HOSP PERFORMED
Amphetamines: NOT DETECTED
BARBITURATES: NOT DETECTED
Benzodiazepines: NOT DETECTED
Cocaine: POSITIVE — AB
Opiates: POSITIVE — AB
TETRAHYDROCANNABINOL: NOT DETECTED

## 2017-02-21 LAB — CBC
HCT: 30.9 % — ABNORMAL LOW (ref 36.0–46.0)
Hemoglobin: 9.9 g/dL — ABNORMAL LOW (ref 12.0–15.0)
MCH: 27 pg (ref 26.0–34.0)
MCHC: 32 g/dL (ref 30.0–36.0)
MCV: 84.4 fL (ref 78.0–100.0)
Platelets: 370 10*3/uL (ref 150–400)
RBC: 3.66 MIL/uL — AB (ref 3.87–5.11)
RDW: 16.9 % — ABNORMAL HIGH (ref 11.5–15.5)
WBC: 7.3 10*3/uL (ref 4.0–10.5)

## 2017-02-21 LAB — TROPONIN I: Troponin I: <0.03 ng/mL (ref <0.03)

## 2017-02-21 LAB — TSH: TSH: 0.613 u[IU]/mL (ref 0.350–4.500)

## 2017-02-21 LAB — SEDIMENTATION RATE: Sed Rate: 35 mm/hr — ABNORMAL HIGH (ref 0–22)

## 2017-02-21 MED ORDER — BUPRENORPHINE HCL-NALOXONE HCL 8-2 MG SL SUBL
2.0000 | SUBLINGUAL_TABLET | Freq: Every day | SUBLINGUAL | Status: DC
  Administered 2017-02-21: 2 via SUBLINGUAL
  Filled 2017-02-21: qty 2

## 2017-02-21 MED ORDER — DICYCLOMINE HCL 20 MG PO TABS
20.0000 mg | ORAL_TABLET | Freq: Four times a day (QID) | ORAL | Status: DC | PRN
  Filled 2017-02-21: qty 1

## 2017-02-21 MED ORDER — NAPROXEN 500 MG PO TABS
500.0000 mg | ORAL_TABLET | Freq: Two times a day (BID) | ORAL | Status: DC | PRN
  Administered 2017-02-22: 500 mg via ORAL
  Filled 2017-02-21 (×2): qty 1

## 2017-02-21 MED ORDER — POTASSIUM CHLORIDE CRYS ER 20 MEQ PO TBCR
40.0000 meq | EXTENDED_RELEASE_TABLET | ORAL | Status: AC
  Filled 2017-02-21: qty 2

## 2017-02-21 MED ORDER — GABAPENTIN 300 MG PO CAPS
300.0000 mg | ORAL_CAPSULE | Freq: Every day | ORAL | Status: DC
  Administered 2017-02-21 – 2017-02-22 (×3): 300 mg via ORAL
  Filled 2017-02-21 (×3): qty 1

## 2017-02-21 MED ORDER — ONDANSETRON 4 MG PO TBDP
4.0000 mg | ORAL_TABLET | Freq: Four times a day (QID) | ORAL | Status: DC | PRN
  Administered 2017-02-22: 4 mg via ORAL
  Filled 2017-02-21: qty 1

## 2017-02-21 MED ORDER — CLONIDINE HCL 0.1 MG PO TABS: 0.1000 mg | ORAL_TABLET | Freq: Every day | ORAL | Status: DC

## 2017-02-21 MED ORDER — CLONIDINE HCL 0.1 MG PO TABS: 0.1000 mg | ORAL_TABLET | ORAL | Status: DC

## 2017-02-21 MED ORDER — CLONIDINE HCL 0.1 MG PO TABS
0.1000 mg | ORAL_TABLET | Freq: Four times a day (QID) | ORAL | Status: AC
  Administered 2017-02-21 – 2017-02-23 (×8): 0.1 mg via ORAL
  Filled 2017-02-21 (×9): qty 1

## 2017-02-21 MED ORDER — METHOCARBAMOL 500 MG PO TABS
500.0000 mg | ORAL_TABLET | Freq: Three times a day (TID) | ORAL | Status: DC | PRN
  Filled 2017-02-21: qty 1

## 2017-02-21 MED ORDER — SODIUM CHLORIDE 0.9 % IV SOLN
INTRAVENOUS | Status: DC
  Administered 2017-02-21 – 2017-02-22 (×3): via INTRAVENOUS

## 2017-02-21 MED ORDER — HYDROXYZINE HCL 25 MG PO TABS
25.0000 mg | ORAL_TABLET | Freq: Four times a day (QID) | ORAL | Status: DC | PRN
  Administered 2017-02-22 (×2): 25 mg via ORAL
  Filled 2017-02-21 (×2): qty 1

## 2017-02-21 MED ORDER — LORAZEPAM 2 MG/ML IJ SOLN: 1.0000 mg | INTRAMUSCULAR | Status: DC | PRN

## 2017-02-21 MED ORDER — LOPERAMIDE HCL 2 MG PO CAPS: 2.0000 mg | ORAL_CAPSULE | ORAL | Status: DC | PRN

## 2017-02-21 NOTE — Progress Notes (Signed)
PROGRESS NOTE    Sherri Francis  TGG:[id number] DOB: Jul 20, 1985 DOA: 02/20/2017 PCP: Patient, No Pcp Per     Brief Narrative:  Sherri Francis is a 31 yo female with history of IV drug abuse, hepatitis C, nicotine dependency, depression and anxiety with prior suicide attempt who was admitted to the hospital between 8/19-9/8 secondary to MSSA endocarditis with tricuspid valve vegetation and septic pulmonary emboli. At that time, infectious disease was consulted and recommended nafcillin through 9/13. However, patient left AMA on 9/8. She now presents with subjective fevers and night sweats. She continues to abuse heroin.  Assessment & Plan:   Principal Problem:   Fever Active Problems:   Opioid type dependence, continuous (HCC)   Anemia   Sepsis secondary to suspected endocarditis, septic emboli with incomplete antibiotic therapy  -Repeat blood cultures pending -Echocardiogram pending  -Infectious disease consulted -Restart nafcillin -IVF   IV drug abuse with drug withdrawal -Psychiatry consulted -I will empirically resume Suboxone, this was given at her last hospitalization -Avoid benzo while on Suboxone  Hypokalemia -Replace, trend   Medical noncompliance -Hx of leaving AMA previously   DVT prophylaxis: lovenox Code Status: full Family Communication: no family at bedside Disposition Plan: pending work up, improvement   Consultants:   ID  Psych  Procedures:   None  Antimicrobials:  Anti-infectives    Start     Dose/Rate Route Frequency Ordered Stop   02/20/17 2130  nafcillin 2 g in dextrose 5 % 100 mL IVPB     2 g 200 mL/hr over 30 Minutes Intravenous Every 4 hours 02/20/17 2117     02/20/17 1730  nafcillin 2 g in dextrose 5 % 100 mL IVPB     2 g 200 mL/hr over 30 Minutes Intravenous  Once 02/20/17 1719 02/20/17 1817        Subjective: On my initial exam this morning, patient was somnolent, did not participate to exam or interview. Did not answer any of  my questions. Wanted to sleep.   Later in the morning, she demanded to speak with me again; we spoke over the phone. Patient complains of withdrawal symptoms, threatening to leave [place] if she is not given Suboxone to help with her withdrawal.   Objective: Vitals:   02/20/17 1900 02/20/17 1930 02/20/17 2032 02/21/17 0616  BP: (!) 126/94 (!) 113/92 125/70 118/73  Pulse:  (!) 105 (!) 104 (!) 105  Resp: 19 20 16 18   Temp:   98.6 F (37 C) (!) 97.5 F (36.4 C)  TempSrc:   Oral Axillary  SpO2:  99% 100% 100%  Weight:    47 kg (103 lb 9.9 oz)  Height:        Intake/Output Summary (Last 24 hours) at 02/21/17 1112 Last data filed at 02/21/17 0934  Gross per 24 hour  Intake           718.75 ml  Output                0 ml  Net           718.75 ml   Filed Weights   02/20/17 1800 02/21/17 0616  Weight: 45.4 kg (100 lb) 47 kg (103 lb 9.9 oz)    Examination:  General exam: Appears somnolent on my initial exam, diaphoretic  Respiratory system: Clear to auscultation. Respiratory effort normal. Cardiovascular system: S1 & S2 heard, tachycardic regular rhythm. No JVD, murmurs, rubs, gallops or clicks. No pedal edema. Gastrointestinal system: Abdomen is nondistended,  soft and nontender. No organomegaly or masses felt. Normal bowel sounds heard. Central nervous system: somnolent on my initial exam  Extremities: Symmetric Skin: No rashes, lesions or ulcers  Data Reviewed: I have personally reviewed following labs and imaging studies  CBC:  Recent Labs Lab 02/20/17 1720 02/21/17 0247  WBC 8.3 7.3  NEUTROABS 2.9  --   HGB 9.6* 9.9*  HCT 29.9* 30.9*  MCV 84.0 84.4  PLT 471* 025   Basic Metabolic Panel:  Recent Labs Lab 02/20/17 1720 02/21/17 0247  NA 140 140  K 3.3* 3.3*  CL 107 108  CO2 26 26  GLUCOSE 101* 129*  BUN 6 11  CREATININE 0.46 0.52  CALCIUM 8.2* 8.0*   GFR: Estimated Creatinine Clearance: 77 mL/min (by C-G formula based on SCr of 0.52  mg/dL). Liver Function Tests:  Recent Labs Lab 02/20/17 1720 02/21/17 0247  AST 19 19  ALT 17 18  ALKPHOS 65 71  BILITOT 0.4 0.8  PROT 6.7 6.3*  ALBUMIN 2.7* 2.6*   No results for input(s): LIPASE, AMYLASE in the last 168 hours. No results for input(s): AMMONIA in the last 168 hours. Coagulation Profile: No results for input(s): INR, PROTIME in the last 168 hours. Cardiac Enzymes:  Recent Labs Lab 02/20/17 2158 02/21/17 0247 02/21/17 0948  TROPONINI <0.03 <0.03 <0.03   BNP (last 3 results) No results for input(s): PROBNP in the last 8760 hours. HbA1C: No results for input(s): HGBA1C in the last 72 hours. CBG: No results for input(s): GLUCAP in the last 168 hours. Lipid Profile: No results for input(s): CHOL, HDL, LDLCALC, TRIG, CHOLHDL, LDLDIRECT in the last 72 hours. Thyroid Function Tests:  Recent Labs  02/20/17 2158  TSH 0.613   Anemia Panel: No results for input(s): VITAMINB12, FOLATE, FERRITIN, TIBC, IRON, RETICCTPCT in the last 72 hours. Sepsis Labs: No results for input(s): PROCALCITON, LATICACIDVEN in the last 168 hours.  Recent Results (from the past 240 hour(s))  Culture, Urine     Status: Abnormal   Collection Time: 02/12/17 12:38 PM  Result Value Ref Range Status   Specimen Description URINE, RANDOM  Final   Special Requests NONE  Final   Culture (A)  Final    <10,000 COLONIES/mL INSIGNIFICANT GROWTH Performed at [facility], [address], [place], [place] [address]    Report Status 02/14/2017 FINAL  Final       Radiology Studies: Dg Chest 2 View  Result Date: 02/20/2017 CLINICAL DATA:  Known endocarditis. EXAM: CHEST  2 VIEW COMPARISON:  January 24, 2017 chest x-ray and CT scan February 13, 2017 FINDINGS: At least 3 nodules remain on the right, 2 which are cavitary. No definitive nodules are seen on the left. Overall, there has been interval improvement. No pneumothorax. The cardiomediastinal silhouette is stable. IMPRESSION:  Continued nodules in the right lung, 2 of which are cavitary. No other definitive nodules identified today. The findings are consistent with known septic emboli. Overall, there has been interval improvement. Electronically Signed   By: Dorise Bullion III M.D   On: 02/20/2017 17:08   Ct Head Wo Contrast  Result Date: 02/20/2017 CLINICAL DATA:  Acute onset of headache.  History of heroin abuse. EXAM: CT HEAD WITHOUT CONTRAST TECHNIQUE: Contiguous axial images were obtained from the base of the skull through the vertex without intravenous contrast. COMPARISON:  None. FINDINGS: Brain: No evidence of acute infarction, hemorrhage, hydrocephalus, extra-axial collection or mass lesion/mass effect. Vascular: No hyperdense vessel or unexpected calcification. Skull: Normal.  Negative for fracture or focal lesion. Sinuses/Orbits: No acute finding. Other: None. IMPRESSION: 1. No acute intracranial abnormalities.  Normal brain. Electronically Signed   By: Kerby Moors M.D.   On: 02/20/2017 20:05      Scheduled Meds: . buprenorphine-naloxone  2 tablet Sublingual Daily  . enoxaparin (LOVENOX) injection  40 mg Subcutaneous QHS  . gabapentin  300 mg Oral QHS  . potassium chloride  40 mEq Oral Q4H   Continuous Infusions: . sodium chloride    . nafcillin IV Stopped (02/21/17 0934)     LOS: 1 day    Time spent: 40 minutes   Dessa Phi, DO [facility] [email or web address] Password [email or web address] 02/21/2017, 11:12 AM

## 2017-02-21 NOTE — Consult Note (Signed)
[facility] Consult   Reason for Consult: Heroin withdrawal, needs detox Referring Physician:  Dr. Maylene Roes Patient Identification: Sherri Francis MRN:  [id number] Principal Diagnosis: Opioid type dependence, continuous [facility]) Diagnosis:   Patient Active Problem List   Diagnosis Date Noted  . Opioid type dependence, continuous ([place]) [F11.20] 01/30/2017    Priority: High  . 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 Opioid abuse with opioid-induced mood disorder ([place]) [F11.14] 02/21/2017  . Fever [R50.9] 02/20/2017  . Anemia [D64.9] 02/20/2017  . 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 Substance induced mood disorder ([place]) [F19.94] 01/30/2017  . Bacteremia due to Staphylococcus aureus [R78.81] 01/27/2017  . Pneumonia [J18.9] 01/24/2017  . Pelvic abscess in female [N73.9] 01/13/2016  . PID (acute pelvic inflammatory disease) [N73.0] 01/13/2016    Total Time spent with patient: 45 minutes  Subjective:   Sherri Francis is a 31 y.o. female patient admitted with cough, fever and bilateral pneumonia.  HPI:  Patient reports history of IV Heroin dependence dating back to 6.5 years,  Hepatitis C, Bipolar and  Depression. However, she has not been compliant with her  prescribed medications for over 2 years but has been self medicating with Heroin, Alcohol, Cocaine and Amphetamine. Patient reports that her drug of choice is Heroin and states that she has been using about 4-6 bags of Heroin daily since her last inpatient admission at [facility]  in August. Patient is requesting for detox from Heroin but denies depression, anxiety, psychosis, SI, HI and delusional thinking.  Past Psychiatric History:as above  Risk to Self: Is patient at risk for suicide?: No Risk to Others:   Prior Inpatient Therapy:   Prior Outpatient Therapy:    Past Medical History:   Past Medical History:  Diagnosis Date  . Anemia   . Anxiety   . Depression   . Endocarditis   . Hepatitis C   . IVDU (intravenous drug user)   . PID (acute pelvic inflammatory disease)   . Suicide attempt [facility])    multiple attempts  . Teratoma   . UTI (urinary tract infection)     Past Surgical History:  Procedure Laterality Date  . LAPAROSCOPY     Family History:  Family History  Problem Relation Age of Onset  . Heart disease Maternal Grandfather   . Anxiety disorder Maternal Grandfather   . Heart disease Maternal Aunt   . Alcohol abuse Father   . Drug abuse Father   . Cancer Neg Hx    Family Psychiatric  History:  Social History:  History  Alcohol Use  . Yes    Comment: 6 beers perday- 2 years ago was last time hx of alcoholism per pt.     History  Drug Use  . Types: IV    Comment: heroin, last 2 days prior to admission    Social History   Social History  . Marital status: Single    Spouse name: N/A  . Number of children: N/A  . Years of education: N/A   Social History Main Topics  . Smoking status: Current Every Day Smoker    Packs/day: 0.50    Types: Cigarettes  . Smokeless tobacco: Never Used  . Alcohol use Yes     Comment: 6 beers perday- 2 years ago was last time hx of alcoholism per pt.  . Drug use: Yes    Types: IV  Comment: heroin, last 2 days prior to admission  . Sexual activity: Not Currently   Other Topics Concern  . None   Social History Narrative  . None   Additional Social History:    Allergies:  No Known Allergies  Labs:  Results for orders placed or performed during the hospital encounter of 02/20/17 (from the past 48 hour(s))  Comprehensive metabolic panel     Status: Abnormal   Collection Time: 02/20/17  5:20 PM  Result Value Ref Range   Sodium 140 135 - 145 mmol/L   Potassium 3.3 (L) 3.5 - 5.1 mmol/L   Chloride 107 101 - 111 mmol/L   CO2 26 22 - 32 mmol/L   Glucose, Bld 101 (H) 65 - 99 mg/dL   BUN 6 6 - 20  mg/dL   Creatinine, Ser 0.46 0.44 - 1.00 mg/dL   Calcium 8.2 (L) 8.9 - 10.3 mg/dL   Total Protein 6.7 6.5 - 8.1 g/dL   Albumin 2.7 (L) 3.5 - 5.0 g/dL   AST 19 15 - 41 U/L   ALT 17 14 - 54 U/L   Alkaline Phosphatase 65 38 - 126 U/L   Total Bilirubin 0.4 0.3 - 1.2 mg/dL   GFR calc non Af Amer >60 >60 mL/min   GFR calc Af Amer >60 >60 mL/min    Comment: (NOTE) The eGFR has been calculated using the CKD EPI equation. This calculation has not been validated in all clinical situations. eGFR's persistently <60 mL/min signify possible Chronic Kidney Disease.    Anion gap 7 5 - 15  CBC with Differential     Status: Abnormal   Collection Time: 02/20/17  5:20 PM  Result Value Ref Range   WBC 8.3 4.0 - 10.5 K/uL   RBC 3.56 (L) 3.87 - 5.11 MIL/uL   Hemoglobin 9.6 (L) 12.0 - 15.0 g/dL   HCT 29.9 (L) 36.0 - 46.0 %   MCV 84.0 78.0 - 100.0 fL   MCH 27.0 26.0 - 34.0 pg   MCHC 32.1 30.0 - 36.0 g/dL   RDW 16.8 (H) 11.5 - 15.5 %   Platelets 471 (H) 150 - 400 K/uL   Neutrophils Relative % 35 %   Neutro Abs 2.9 1.7 - 7.7 K/uL   Lymphocytes Relative 47 %   Lymphs Abs 3.9 0.7 - 4.0 K/uL   Monocytes Relative 13 %   Monocytes Absolute 1.1 (H) 0.1 - 1.0 K/uL   Eosinophils Relative 5 %   Eosinophils Absolute 0.4 0.0 - 0.7 K/uL   Basophils Relative 0 %   Basophils Absolute 0.0 0.0 - 0.1 K/uL  I-stat troponin, ED     Status: None   Collection Time: 02/20/17  5:31 PM  Result Value Ref Range   Troponin i, poc 0.00 0.00 - 0.08 ng/mL   Comment 3            Comment: Due to the release kinetics of cTnI, a negative result within the first hours of the onset of symptoms does not rule out myocardial infarction with certainty. If myocardial infarction is still suspected, repeat the test at appropriate intervals.   Urinalysis, Routine w reflex microscopic     Status: Abnormal   Collection Time: 02/20/17  7:53 PM  Result Value Ref Range   Color, Urine YELLOW YELLOW   APPearance CLOUDY (A) CLEAR    Specific Gravity, Urine 1.017 1.005 - 1.030   pH 6.0 5.0 - 8.0   Glucose, UA NEGATIVE NEGATIVE mg/dL   Hgb  urine dipstick NEGATIVE NEGATIVE   Bilirubin Urine NEGATIVE NEGATIVE   Ketones, ur NEGATIVE NEGATIVE mg/dL   Protein, ur NEGATIVE NEGATIVE mg/dL   Nitrite NEGATIVE NEGATIVE   Leukocytes, UA SMALL (A) NEGATIVE   RBC / HPF 6-30 0 - 5 RBC/hpf   WBC, UA 6-30 0 - 5 WBC/hpf   Bacteria, UA RARE (A) NONE SEEN   Squamous Epithelial / LPF TOO NUMEROUS TO COUNT (A) NONE SEEN   Mucus PRESENT   Urine rapid drug screen (hosp performed)     Status: Abnormal   Collection Time: 02/20/17  7:53 PM  Result Value Ref Range   Opiates POSITIVE (A) NONE DETECTED   Cocaine POSITIVE (A) NONE DETECTED   Benzodiazepines NONE DETECTED NONE DETECTED   Amphetamines NONE DETECTED NONE DETECTED   Tetrahydrocannabinol NONE DETECTED NONE DETECTED   Barbiturates NONE DETECTED NONE DETECTED    Comment:        DRUG SCREEN FOR MEDICAL PURPOSES ONLY.  IF CONFIRMATION IS NEEDED FOR ANY PURPOSE, NOTIFY LAB WITHIN 5 DAYS.        LOWEST DETECTABLE LIMITS FOR URINE DRUG SCREEN Drug Class       Cutoff (ng/mL) Amphetamine      1000 Barbiturate      200 Benzodiazepine   562 Tricyclics       563 Opiates          300 Cocaine          300 THC              50   TSH     Status: None   Collection Time: 02/20/17  9:58 PM  Result Value Ref Range   TSH 0.613 0.350 - 4.500 uIU/mL    Comment: Performed by a 3rd Generation assay with a functional sensitivity of <=0.01 uIU/mL.  Troponin I     Status: None   Collection Time: 02/20/17  9:58 PM  Result Value Ref Range   Troponin I <0.03 <0.03 ng/mL  CBC     Status: Abnormal   Collection Time: 02/21/17  2:47 AM  Result Value Ref Range   WBC 7.3 4.0 - 10.5 K/uL   RBC 3.66 (L) 3.87 - 5.11 MIL/uL   Hemoglobin 9.9 (L) 12.0 - 15.0 g/dL   HCT 30.9 (L) 36.0 - 46.0 %   MCV 84.4 78.0 - 100.0 fL   MCH 27.0 26.0 - 34.0 pg   MCHC 32.0 30.0 - 36.0 g/dL   RDW 16.9 (H) 11.5 - 15.5  %   Platelets 370 150 - 400 K/uL  Comprehensive metabolic panel     Status: Abnormal   Collection Time: 02/21/17  2:47 AM  Result Value Ref Range   Sodium 140 135 - 145 mmol/L   Potassium 3.3 (L) 3.5 - 5.1 mmol/L   Chloride 108 101 - 111 mmol/L   CO2 26 22 - 32 mmol/L   Glucose, Bld 129 (H) 65 - 99 mg/dL   BUN 11 6 - 20 mg/dL   Creatinine, Ser 0.52 0.44 - 1.00 mg/dL   Calcium 8.0 (L) 8.9 - 10.3 mg/dL   Total Protein 6.3 (L) 6.5 - 8.1 g/dL   Albumin 2.6 (L) 3.5 - 5.0 g/dL   AST 19 15 - 41 U/L   ALT 18 14 - 54 U/L   Alkaline Phosphatase 71 38 - 126 U/L   Total Bilirubin 0.8 0.3 - 1.2 mg/dL   GFR calc non Af Amer >60 >60 mL/min   GFR calc Af  Amer >60 >60 mL/min    Comment: (NOTE) The eGFR has been calculated using the CKD EPI equation. This calculation has not been validated in all clinical situations. eGFR's persistently <60 mL/min signify possible Chronic Kidney Disease.    Anion gap 6 5 - 15  Sedimentation rate     Status: Abnormal   Collection Time: 02/21/17  2:47 AM  Result Value Ref Range   Sed Rate 35 (H) 0 - 22 mm/hr  Troponin I     Status: None   Collection Time: 02/21/17  2:47 AM  Result Value Ref Range   Troponin I <0.03 <0.03 ng/mL  Troponin I     Status: None   Collection Time: 02/21/17  9:48 AM  Result Value Ref Range   Troponin I <0.03 <0.03 ng/mL    Current Facility-Administered Medications  Medication Dose Route Frequency Provider Last Rate Last Dose  . 0.9 %  sodium chloride infusion   Intravenous Continuous Dessa Phi Chahn-Yang, DO 75 mL/hr at 02/21/17 1238    . acetaminophen (TYLENOL) tablet 650 mg  650 mg Oral Q6H PRN Jani Gravel, MD       Or  . acetaminophen (TYLENOL) suppository 650 mg  650 mg Rectal Q6H PRN Jani Gravel, MD      . buprenorphine-naloxone (SUBOXONE) 8-2 mg per SL tablet 2 tablet  2 tablet Sublingual Daily Dessa Phi Chahn-Yang, DO   2 tablet at 02/21/17 1312  . enoxaparin (LOVENOX) injection 40 mg  40 mg Subcutaneous QHS Jani Gravel, MD      . gabapentin (NEURONTIN) capsule 300 mg  300 mg Oral Loma Sousa, MD   300 mg at 02/21/17 0045  . nafcillin 2 g in dextrose 5 % 100 mL IVPB  2 g Intravenous Q4H Jani Gravel, MD   Stopped at 02/21/17 [phone]  . potassium chloride SA (K-DUR,KLOR-CON) CR tablet 40 mEq  40 mEq Oral Q4H Shon Millet, DO        Musculoskeletal: Strength & Muscle Tone: within normal limits Gait & Station: normal Patient leans: N/A  Psychiatric Specialty Exam: Physical Exam  Psychiatric: Her speech is normal. Thought content normal. Her mood appears anxious. Her affect is labile. She is agitated. Cognition and memory are normal. She expresses impulsivity.    Review of Systems  Constitutional: Positive for malaise/fatigue.  HENT: Negative.   Eyes: Negative.   Respiratory: Positive for cough.   Gastrointestinal: Negative.   Genitourinary: Negative.   Musculoskeletal: Positive for myalgias.  Skin: Negative.   Neurological: Negative.   Endo/Heme/Allergies: Negative.   Psychiatric/Behavioral: Positive for substance abuse. The patient is nervous/anxious.     Blood pressure 118/73, pulse (!) 105, temperature (!) 97.5 F (36.4 C), temperature source Axillary, resp. rate 18, height '5\' 1"'$  (1.549 m), weight 47 kg (103 lb 9.9 oz), last menstrual period 02/13/2017, SpO2 100 %.Body mass index is 19.58 kg/m.  General Appearance: Casual  Eye Contact:  Minimal  Speech:  Clear and Coherent  Volume:  Decreased  Mood:  Anxious and Irritable  Affect:  Labile  Thought Process:  Coherent and Descriptions of Associations: Intact  Orientation:  Full (Time, Place, and Person)  Thought Content:  Logical  Suicidal Thoughts:  No  Homicidal Thoughts:  No  Memory:  Immediate;   Fair Recent;   Fair Remote;   Fair  Judgement:  Other:  marginal  Insight:  Shallow  Psychomotor Activity:  Restlessness  Concentration:  Concentration: Fair and Attention Span: Fair  Recall:  Good  Fund of Knowledge:   Good  Language:  Good  Akathisia:  No  Handed:  Right  AIMS (if indicated):     Assets:  Communication Skills  ADL's:  Intact  Cognition:  WNL  Sleep:   fair     Treatment Plan Summary: Patient with history Opioid dependence and Cocaine Dependence who has been self medicating with Heroin. She was admitted for pneumonia. She is requesting for Heroin detoxification.   Plan/Recomendations:  -Start  Clonidine Opioid withdrawal protocol. -Discontinue Suboxone-patient needs to be detoxified but referring her to a Suboxone treatment center. -Patient is cleared by psychiatric service.  Disposition: No evidence of imminent risk to self or others at present.   Patient does not meet criteria for psychiatric inpatient admission. Supportive therapy provided about ongoing stressors. Social worker to assist with referring patient to drug rehab center upon discharge  Corena Pilgrim, MD 02/21/2017 1:57 PM

## 2017-02-21 NOTE — Progress Notes (Signed)
  Echocardiogram 2D Echocardiogram has been performed.  Sherri Francis T Adiya Selmer 02/21/2017, 10:54 AM

## 2017-02-21 NOTE — Consult Note (Signed)
[facility] for Infectious Disease  Total days of antibiotics 2 nafcillin       Reason for Consult: partially treated native TV endocarditis  Referring Physician: Maudie Mercury  Principal Problem:   Opioid type dependence, continuous ([place]) Active Problems:   Fever   Anemia   Opioid abuse with opioid-induced mood disorder (H[facility])    HPI: Sherri Francis is a 31 y.o. 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 She was to stay hospitalized to finish out her abtx treatment course til 9/13 however, she left AMA on 9/8. She returns on 9/15 for having recurrent fevers and nightsweats. In the ED WBC is WNL and she is afebrile.UDS positive for opiates and cocaine. CXR shows overall improvement. Blood and urine cx have been collected. She was placed back on nafcillin. Repeat TTE is pending   Past Medical History:  Diagnosis Date  . Anemia   . Anxiety   . Depression   . Endocarditis   . Hepatitis C   . IVDU (intravenous drug user)   . PID (acute pelvic inflammatory disease)   . Suicide attempt [facility])    multiple attempts  . Teratoma   . UTI (urinary tract infection)     Allergies: No Known Allergies  Current antibiotics:   MEDICATIONS: . cloNIDine  0.1 mg Oral QID   Followed by  . [START ON 02/23/2017] cloNIDine  0.1 mg Oral BH-qamhs   Followed by  . [START ON 02/26/2017] cloNIDine  0.1 mg Oral QAC breakfast  . enoxaparin (LOVENOX) injection  40 mg Subcutaneous QHS  . gabapentin  300 mg Oral QHS  . potassium chloride  40 mEq Oral Q4H    Social History  Substance Use Topics  . Smoking status: Current Every Day Smoker    Packs/day: 0.50    Types: Cigarettes  . Smokeless tobacco: Never Used  . Alcohol use Yes     Comment: 6 beers perday- 2 years ago was  last time hx of alcoholism per pt.    Family History  Problem Relation Age of Onset  . Heart disease Maternal Grandfather   . Anxiety disorder Maternal Grandfather   . Heart disease Maternal Aunt   . Alcohol abuse Father   . Drug abuse Father   . Cancer Neg Hx     Review of Systems - unavailable for interview  OBJECTIVE: Temp:  [97.5 F (36.4 C)-98.6 F (37 C)] 98 F (36.7 C) (09/16 1444) Pulse Rate:  [101-114] 114 (09/16 1444) Resp:  [14-20] 18 (09/16 1444) BP: (112-126)/(64-94) 125/72 (09/16 1444) SpO2:  [99 %-100 %] 99 % (09/16 1444) Weight:  [100 lb (45.4 kg)-103 lb 9.9 oz (47 kg)] 103 lb 9.9 oz (47 kg) (09/16 0616) Out of the room LABS: Results for orders placed or performed during the hospital encounter of 02/20/17 (from the past 48 hour(s))  Comprehensive metabolic panel     Status: Abnormal   Collection Time: 02/20/17  5:20 PM  Result Value Ref Range   Sodium 140 135 - 145 mmol/L   Potassium 3.3 (L) 3.5 - 5.1 mmol/L   Chloride 107 101 - 111 mmol/L   CO2 26 22 - 32 mmol/L   Glucose, Bld 101 (H) 65 - 99 mg/dL   BUN 6 6 - 20 mg/dL  Creatinine, Ser 0.46 0.44 - 1.00 mg/dL   Calcium 8.2 (L) 8.9 - 10.3 mg/dL   Total Protein 6.7 6.5 - 8.1 g/dL   Albumin 2.7 (L) 3.5 - 5.0 g/dL   AST 19 15 - 41 U/L   ALT 17 14 - 54 U/L   Alkaline Phosphatase 65 38 - 126 U/L   Total Bilirubin 0.4 0.3 - 1.2 mg/dL   GFR calc non Af Amer >60 >60 mL/min   GFR calc Af Amer >60 >60 mL/min    Comment: (NOTE) The eGFR has been calculated using the CKD EPI equation. This calculation has not been validated in all clinical situations. eGFR's persistently <60 mL/min signify possible Chronic Kidney Disease.    Anion gap 7 5 - 15  CBC with Differential     Status: Abnormal   Collection Time: 02/20/17  5:20 PM  Result Value Ref Range   WBC 8.3 4.0 - 10.5 K/uL   RBC 3.56 (L) 3.87 - 5.11 MIL/uL   Hemoglobin 9.6 (L) 12.0 - 15.0 g/dL   HCT 29.9 (L) 36.0 - 46.0 %   MCV 84.0 78.0 - 100.0 fL    MCH 27.0 26.0 - 34.0 pg   MCHC 32.1 30.0 - 36.0 g/dL   RDW 16.8 (H) 11.5 - 15.5 %   Platelets 471 (H) 150 - 400 K/uL   Neutrophils Relative % 35 %   Neutro Abs 2.9 1.7 - 7.7 K/uL   Lymphocytes Relative 47 %   Lymphs Abs 3.9 0.7 - 4.0 K/uL   Monocytes Relative 13 %   Monocytes Absolute 1.1 (H) 0.1 - 1.0 K/uL   Eosinophils Relative 5 %   Eosinophils Absolute 0.4 0.0 - 0.7 K/uL   Basophils Relative 0 %   Basophils Absolute 0.0 0.0 - 0.1 K/uL  I-stat troponin, ED     Status: None   Collection Time: 02/20/17  5:31 PM  Result Value Ref Range   Troponin i, poc 0.00 0.00 - 0.08 ng/mL   Comment 3            Comment: Due to the release kinetics of cTnI, a negative result within the first hours of the onset of symptoms does not rule out myocardial infarction with certainty. If myocardial infarction is still suspected, repeat the test at appropriate intervals.   Urinalysis, Routine w reflex microscopic     Status: Abnormal   Collection Time: 02/20/17  7:53 PM  Result Value Ref Range   Color, Urine YELLOW YELLOW   APPearance CLOUDY (A) CLEAR   Specific Gravity, Urine 1.017 1.005 - 1.030   pH 6.0 5.0 - 8.0   Glucose, UA NEGATIVE NEGATIVE mg/dL   Hgb urine dipstick NEGATIVE NEGATIVE   Bilirubin Urine NEGATIVE NEGATIVE   Ketones, ur NEGATIVE NEGATIVE mg/dL   Protein, ur NEGATIVE NEGATIVE mg/dL   Nitrite NEGATIVE NEGATIVE   Leukocytes, UA SMALL (A) NEGATIVE   RBC / HPF 6-30 0 - 5 RBC/hpf   WBC, UA 6-30 0 - 5 WBC/hpf   Bacteria, UA RARE (A) NONE SEEN   Squamous Epithelial / LPF TOO NUMEROUS TO COUNT (A) NONE SEEN   Mucus PRESENT   Urine rapid drug screen (hosp performed)     Status: Abnormal   Collection Time: 02/20/17  7:53 PM  Result Value Ref Range   Opiates POSITIVE (A) NONE DETECTED   Cocaine POSITIVE (A) NONE DETECTED   Benzodiazepines NONE DETECTED NONE DETECTED   Amphetamines NONE DETECTED NONE DETECTED   Tetrahydrocannabinol NONE DETECTED  NONE DETECTED   Barbiturates NONE  DETECTED NONE DETECTED    Comment:        DRUG SCREEN FOR MEDICAL PURPOSES ONLY.  IF CONFIRMATION IS NEEDED FOR ANY PURPOSE, NOTIFY LAB WITHIN 5 DAYS.        LOWEST DETECTABLE LIMITS FOR URINE DRUG SCREEN Drug Class       Cutoff (ng/mL) Amphetamine      1000 Barbiturate      200 Benzodiazepine   676 Tricyclics       720 Opiates          300 Cocaine          300 THC              50   TSH     Status: None   Collection Time: 02/20/17  9:58 PM  Result Value Ref Range   TSH 0.613 0.350 - 4.500 uIU/mL    Comment: Performed by a 3rd Generation assay with a functional sensitivity of <=0.01 uIU/mL.  Troponin I     Status: None   Collection Time: 02/20/17  9:58 PM  Result Value Ref Range   Troponin I <0.03 <0.03 ng/mL  CBC     Status: Abnormal   Collection Time: 02/21/17  2:47 AM  Result Value Ref Range   WBC 7.3 4.0 - 10.5 K/uL   RBC 3.66 (L) 3.87 - 5.11 MIL/uL   Hemoglobin 9.9 (L) 12.0 - 15.0 g/dL   HCT 30.9 (L) 36.0 - 46.0 %   MCV 84.4 78.0 - 100.0 fL   MCH 27.0 26.0 - 34.0 pg   MCHC 32.0 30.0 - 36.0 g/dL   RDW 16.9 (H) 11.5 - 15.5 %   Platelets 370 150 - 400 K/uL  Comprehensive metabolic panel     Status: Abnormal   Collection Time: 02/21/17  2:47 AM  Result Value Ref Range   Sodium 140 135 - 145 mmol/L   Potassium 3.3 (L) 3.5 - 5.1 mmol/L   Chloride 108 101 - 111 mmol/L   CO2 26 22 - 32 mmol/L   Glucose, Bld 129 (H) 65 - 99 mg/dL   BUN 11 6 - 20 mg/dL   Creatinine, Ser 0.52 0.44 - 1.00 mg/dL   Calcium 8.0 (L) 8.9 - 10.3 mg/dL   Total Protein 6.3 (L) 6.5 - 8.1 g/dL   Albumin 2.6 (L) 3.5 - 5.0 g/dL   AST 19 15 - 41 U/L   ALT 18 14 - 54 U/L   Alkaline Phosphatase 71 38 - 126 U/L   Total Bilirubin 0.8 0.3 - 1.2 mg/dL   GFR calc non Af Amer >60 >60 mL/min   GFR calc Af Amer >60 >60 mL/min    Comment: (NOTE) The eGFR has been calculated using the CKD EPI equation. This calculation has not been validated in all clinical situations. eGFR's persistently <60 mL/min  signify possible Chronic Kidney Disease.    Anion gap 6 5 - 15  Sedimentation rate     Status: Abnormal   Collection Time: 02/21/17  2:47 AM  Result Value Ref Range   Sed Rate 35 (H) 0 - 22 mm/hr  Troponin I     Status: None   Collection Time: 02/21/17  2:47 AM  Result Value Ref Range   Troponin I <0.03 <0.03 ng/mL  Troponin I     Status: None   Collection Time: 02/21/17  9:48 AM  Result Value Ref Range   Troponin I <0.03 <0.03 ng/mL  MICRO:  IMAGING: Dg Chest 2 View  Result Date: 02/20/2017 CLINICAL DATA:  Known endocarditis. EXAM: CHEST  2 VIEW COMPARISON:  January 24, 2017 chest x-ray and CT scan February 13, 2017 FINDINGS: At least 3 nodules remain on the right, 2 which are cavitary. No definitive nodules are seen on the left. Overall, there has been interval improvement. No pneumothorax. The cardiomediastinal silhouette is stable. IMPRESSION: Continued nodules in the right lung, 2 of which are cavitary. No other definitive nodules identified today. The findings are consistent with known septic emboli. Overall, there has been interval improvement. Electronically Signed   By: Dorise Bullion III M.D   On: 02/20/2017 17:08   Ct Head Wo Contrast  Result Date: 02/20/2017 CLINICAL DATA:  Acute onset of headache.  History of heroin abuse. EXAM: CT HEAD WITHOUT CONTRAST TECHNIQUE: Contiguous axial images were obtained from the base of the skull through the vertex without intravenous contrast. COMPARISON:  None. FINDINGS: Brain: No evidence of acute infarction, hemorrhage, hydrocephalus, extra-axial collection or mass lesion/mass effect. Vascular: No hyperdense vessel or unexpected calcification. Skull: Normal. Negative for fracture or focal lesion. Sinuses/Orbits: No acute finding. Other: None. IMPRESSION: 1. No acute intracranial abnormalities.  Normal brain. Electronically Signed   By: Kerby Moors M.D.   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 She returns with drenching nightsweats and subjective fevers.  - continue on nafcillin for now - agree with getting repeat TTE to see if there is further progression/destruction of her TV. - would like to follow blood cx to see if possibly she has other pathogen though I suspect this is still MSSA - currently on clonidine for opioid withdrawal.  Dr Megan Salon to provide further recs

## 2017-02-21 NOTE — Progress Notes (Signed)
Spoke with ID they will be by, appreciate input

## 2017-02-21 NOTE — Progress Notes (Signed)
Patient refused potassium that was ordered. Re-educated patient but still refuses to take it.

## 2017-02-22 DIAGNOSIS — I2690 Septic pulmonary embolism without acute cor pulmonale: Secondary | ICD-10-CM

## 2017-02-22 DIAGNOSIS — I330 Acute and subacute infective endocarditis: Secondary | ICD-10-CM

## 2017-02-22 DIAGNOSIS — B9561 Methicillin susceptible Staphylococcus aureus infection as the cause of diseases classified elsewhere: Secondary | ICD-10-CM

## 2017-02-22 DIAGNOSIS — F1120 Opioid dependence, uncomplicated: Secondary | ICD-10-CM

## 2017-02-22 LAB — URINE CULTURE: Culture: NO GROWTH

## 2017-02-22 LAB — ECHOCARDIOGRAM COMPLETE
Height: 61 in
Weight: 1657.86 oz

## 2017-02-22 MED ORDER — LORAZEPAM 2 MG/ML IJ SOLN
0.5000 mg | Freq: Once | INTRAMUSCULAR | Status: AC
  Administered 2017-02-22: 0.5 mg via INTRAVENOUS
  Filled 2017-02-22: qty 1

## 2017-02-22 NOTE — Progress Notes (Signed)
Patient refuses labs to be drawn, VS taken, and weight to be checked at this time. Pt requests to sleep longer and asks staff not to come back until 0800. Will make next shift aware.

## 2017-02-22 NOTE — Progress Notes (Signed)
[facility]  Date of Admission:  02/20/2017           Day 2 nafcillin ASSESSMENT: So far, repeat blood cultures are negative. CT scan shows that her cavitary pulmonary nodules have improved. Results of repeat TTE are pending. If blood cultures remain negative and TTE does not show anything other than persistent tricuspid valve vegetations I will consider completing therapy with an oral regimen of cephalexin plus rifampin.  PLAN: 1. Continue nafcillin pending final blood culture and TTE results  Principal Problem:   Opioid type dependence, continuous (HCC) Active Problems:   Fever   Anemia   Opioid abuse with opioid-induced mood disorder ([place])   . cloNIDine  0.1 mg Oral QID   Followed by  . [START ON 02/23/2017] cloNIDine  0.1 mg Oral BH-qamhs   Followed by  . [START ON 02/26/2017] cloNIDine  0.1 mg Oral QAC breakfast  . enoxaparin (LOVENOX) injection  40 mg Subcutaneous QHS  . gabapentin  300 mg Oral QHS    SUBJECTIVE: She says that she is hurting all over and feels like she is starting to withdraw from opiates. 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 She developed recurrent fever, chills and sweats leading to readmission 2 days ago.   Review of Systems: Review of Systems  Constitutional: Positive for chills, diaphoresis, fever and malaise/fatigue.  Respiratory: Positive for cough and sputum production. Negative for shortness of breath.   Cardiovascular: Positive for chest pain.  Gastrointestinal: Negative for abdominal pain, diarrhea, nausea and vomiting.  Skin: Negative for rash.  Psychiatric/Behavioral: Positive for substance abuse.    No Known Allergies  OBJECTIVE: Vitals:   02/21/17 1444 02/21/17 1835 02/21/17 2111 02/22/17 0945  BP: 125/72 118/81 108/63 107/62  Pulse: (!) 114  94 85 82  Resp: 18  18 16   Temp: 98 F (36.7 C)  98.1 F (36.7 C) 98.2 F (36.8 C)  TempSrc: Oral  Oral Oral  SpO2: 99%  100%   Weight:      Height:       Body mass index is 19.58 kg/m.  Physical Exam  Constitutional: She is oriented to person, place, and time.  She is alert and appears comfortable, resting quietly in bed.  HENT:  Mouth/Throat: No oropharyngeal exudate.  Cardiovascular: Normal rate and regular rhythm.   No murmur heard. Pulmonary/Chest: Effort normal and breath sounds normal. She has no wheezes. She has no rales.  Abdominal: Soft. She exhibits no distension. There is no tenderness.  Musculoskeletal: Normal range of motion. She exhibits no edema or tenderness.  Neurological: She is alert and oriented to person, place, and time.  Skin: No rash noted.  She has a small scab on the tip of her right thumb where she said she had infection last month.  Psychiatric: Mood and affect normal.    Lab Results Lab Results  Component Value Date   WBC 7.3 02/21/2017   HGB 9.9 (L) 02/21/2017   HCT 30.9 (L) 02/21/2017   MCV 84.4 02/21/2017   PLT 370 02/21/2017    Lab Results  Component Value Date   CREATININE 0.52 02/21/2017   BUN 11 02/21/2017   NA 140 02/21/2017   K 3.3 (L) 02/21/2017   CL 108 02/21/2017   CO2 26 02/21/2017    Lab Results  Component Value  Date   ALT 18 02/21/2017   AST 19 02/21/2017   ALKPHOS 71 02/21/2017   BILITOT 0.8 02/21/2017     Microbiology: Recent Results (from the past 240 hour(s))  Culture, Urine     Status: Abnormal   Collection Time: 02/12/17 12:38 PM  Result Value Ref Range Status   Specimen Description URINE, RANDOM  Final   Special Requests NONE  Final   Culture (A)  Final    <10,000 COLONIES/mL INSIGNIFICANT GROWTH Performed at [facility], [address], [place], [place] [address]    Report Status 02/14/2017 FINAL  Final  Culture, Urine     Status: None   Collection Time: 02/20/17  7:54 PM  Result Value  Ref Range Status   Specimen Description URINE, RANDOM  Final   Special Requests NONE  Final   Culture   Final    NO GROWTH Performed at [facility], [place] [address], [place], [place] [address]    Report Status 02/22/2017 FINAL  Final    Michel Bickers, MD [facility] [phone] pager   [phone] cell 02/22/2017, 10:27 AM

## 2017-02-22 NOTE — Clinical Social Work Note (Signed)
Clinical Social Work Assessment  Patient Details  Name: Sherri Francis MRN: [id number] Date of Birth: 12-31-1985  Date of referral:  02/22/17               Reason for consult:  Substance Use/ETOH Abuse                Permission sought to share information with:  Family Supports Permission granted to share information::     Name::     Tereasa Yilmaz  Agency::     Relationship::  Mother   Contact Information:  [phone]  Housing/Transportation Living arrangements for the past 2 months:  No permanent address (Patient reports that she has been staying with a friend) Source of Information:  Patient Patient Interpreter Needed:  None Criminal Activity/Legal Involvement Pertinent to Current Situation/Hospitalization:  No - Comment as needed Significant Relationships:  Friend, Parents Lives with:  Friends Do you feel safe going back to the place where you live?   (Patient reported that she currently does not have a place to live) Need for family participation in patient care:  No (Coment)  Care giving concerns:  CSW consulted for current substance use. Patient reported that she was staying with a friend but does not plan on returning because they deal drugs. Patient reported that she is tired of getting high and interested in residential substance abuse treatment. Patient requested to be placed in a residential substance abuse facility in [facility] and informed CSW that she may have warrants.    Social Worker assessment / plan:  CSW spoke with patient at bedside regarding substance use. Patient reported that she wasn't feeling good and came to the hospital to detox. Patient reported that she is tired of getting high. CSW and patient discussed patient's past substance abuse treatment. Patient reported that she has been to several residential facilities ([place]). Patient reported that she is concerned about potential warrants and that she does not want to go to jail  because jail isn't treatment. CSW validated patient's concerns and positively affirmed her motivation to get treatment. Patient granted CSW verbal permission to contact her mother, noting that her mother is going to try to persuade her to get treatment out of the state. Patient reported that she is an adult and that she does not want to go out of the state nor [place] to get treatment.   CSW will reach out to local residential treatment facilities and inquire about bed availability.   Employment status:  Unemployed Forensic scientist:  Self Pay (Medicaid Pending) PT Recommendations:  Not assessed at this time Information / Referral to community resources:  Residential Substance Abuse Treatment Options  Patient/Family's Response to care:  Patient reported that she is tired of getting high and wants to go to residential treatment.   Patient/Family's Understanding of and Emotional Response to Diagnosis, Current Treatment, and Prognosis:  Patient presented blunt throughout assessment. Patient verbalized some understanding of current treatment and verbalized a plan to discharge to residential treatment.   Emotional Assessment Appearance:  Appears stated age Attitude/Demeanor/Rapport:    Affect (typically observed):  Blunt Orientation:  Oriented to Self, Oriented to Place, Oriented to  Time, Oriented to Situation Alcohol / Substance use:  Illicit Drugs Psych involvement (Current and /or in the community):  No (Comment)  Discharge Needs  Concerns to be addressed:  Discharge Planning Concerns, Substance Abuse Concerns Readmission within the last 30 days:  Yes Current discharge risk:  Substance Abuse Barriers  to Discharge:  Continued Medical Work up   [employer], LCSW 02/22/2017, 4:29 PM

## 2017-02-22 NOTE — Progress Notes (Signed)
PROGRESS NOTE    Sherri Francis  PFX:[id number] DOB: July 04, 1985 DOA: 02/20/2017 PCP: Patient, No Pcp Per     Brief Narrative:  Sherri Francis is a 31 yo female with history of IV drug abuse, hepatitis C, nicotine dependency, depression and anxiety with prior suicide attempt who was admitted to the hospital between 8/19-9/8 secondary to MSSA endocarditis with tricuspid valve vegetation and septic pulmonary emboli. At that time, infectious disease was consulted and recommended nafcillin through 9/13. However, patient left AMA on 9/8. She now presents with subjective fevers and night sweats. She continues to abuse heroin.   Assessment & Plan:   Principal Problem:   Opioid type dependence, continuous (HCC) Active Problems:   Fever   Anemia   Opioid abuse with opioid-induced mood disorder (HCC)   Sepsis secondary to suspected endocarditis, septic emboli with incomplete antibiotic therapy  -Repeat blood cultures pending -Echocardiogram pending  -Infectious disease following  -Continue nafcillin -IVF   IV drug abuse with drug withdrawal -UDS positive for opiates, cocaine  -Psychiatry consulted. He has started clonidine opioid withdrawal protocol and discontinued suboxone. SW consulted   Hypokalemia -Refused placement and repeat labs   Medical noncompliance -Hx of leaving AMA previously   DVT prophylaxis: lovenox Code Status: full Family Communication: left VM for mother to call back. Spoke with mother over phone on 9/16  Disposition Plan: pending work up, improvement   Consultants:   ID  Psych  Procedures:   None  Antimicrobials:  Anti-infectives    Start     Dose/Rate Route Frequency Ordered Stop   02/20/17 2130  nafcillin 2 g in dextrose 5 % 100 mL IVPB     2 g 200 mL/hr over 30 Minutes Intravenous Every 4 hours 02/20/17 2117     02/20/17 1730  nafcillin 2 g in dextrose 5 % 100 mL IVPB     2 g 200 mL/hr over 30 Minutes Intravenous  Once 02/20/17 1719 02/20/17 1817        Subjective: Patient is sleeping, very agitated that we are not giving her suboxone. States we are making her suffer by not giving her suboxone. Continues to have night sweats. Has been refusing labs, potassium replacement.   Objective: Vitals:   02/21/17 1444 02/21/17 1835 02/21/17 2111 02/22/17 0945  BP: 125/72 118/81 108/63 107/62  Pulse: (!) 114 94 85 82  Resp: 18  18 16   Temp: 98 F (36.7 C)  98.1 F (36.7 C) 98.2 F (36.8 C)  TempSrc: Oral  Oral Oral  SpO2: 99%  100%   Weight:      Height:        Intake/Output Summary (Last 24 hours) at 02/22/17 1055 Last data filed at 02/22/17 0300  Gross per 24 hour  Intake          1988.75 ml  Output                0 ml  Net          1988.75 ml   Filed Weights   02/20/17 1800 02/21/17 0616  Weight: 45.4 kg (100 lb) 47 kg (103 lb 9.9 oz)    Examination:  General exam: Sleeping, agitated upon awaking  Respiratory system: Clear to auscultation. Respiratory effort normal. Cardiovascular system: S1 & S2 heard, tachycardic regular rhythm. No JVD, murmurs, rubs, gallops or clicks. No pedal edema. Gastrointestinal system: Abdomen is nondistended, soft and nontender. No organomegaly or masses felt. Normal bowel sounds heard. Central nervous system: alert to voice,  agitated and does not actively participate in exam  Extremities: Symmetric Skin: No rashes, lesions or ulcers  Data Reviewed: I have personally reviewed following labs and imaging studies  CBC:  Recent Labs Lab 02/20/17 1720 02/21/17 0247  WBC 8.3 7.3  NEUTROABS 2.9  --   HGB 9.6* 9.9*  HCT 29.9* 30.9*  MCV 84.0 84.4  PLT 471* 767   Basic Metabolic Panel:  Recent Labs Lab 02/20/17 1720 02/21/17 0247  NA 140 140  K 3.3* 3.3*  CL 107 108  CO2 26 26  GLUCOSE 101* 129*  BUN 6 11  CREATININE 0.46 0.52  CALCIUM 8.2* 8.0*   GFR: Estimated Creatinine Clearance: 77 mL/min (by C-G formula based on SCr of 0.52 mg/dL). Liver Function Tests:  Recent  Labs Lab 02/20/17 1720 02/21/17 0247  AST 19 19  ALT 17 18  ALKPHOS 65 71  BILITOT 0.4 0.8  PROT 6.7 6.3*  ALBUMIN 2.7* 2.6*   No results for input(s): LIPASE, AMYLASE in the last 168 hours. No results for input(s): AMMONIA in the last 168 hours. Coagulation Profile: No results for input(s): INR, PROTIME in the last 168 hours. Cardiac Enzymes:  Recent Labs Lab 02/20/17 2158 02/21/17 0247 02/21/17 0948  TROPONINI <0.03 <0.03 <0.03   BNP (last 3 results) No results for input(s): PROBNP in the last 8760 hours. HbA1C: No results for input(s): HGBA1C in the last 72 hours. CBG: No results for input(s): GLUCAP in the last 168 hours. Lipid Profile: No results for input(s): CHOL, HDL, LDLCALC, TRIG, CHOLHDL, LDLDIRECT in the last 72 hours. Thyroid Function Tests:  Recent Labs  02/20/17 2158  TSH 0.613   Anemia Panel: No results for input(s): VITAMINB12, FOLATE, FERRITIN, TIBC, IRON, RETICCTPCT in the last 72 hours. Sepsis Labs: No results for input(s): PROCALCITON, LATICACIDVEN in the last 168 hours.  Recent Results (from the past 240 hour(s))  Culture, Urine     Status: Abnormal   Collection Time: 02/12/17 12:38 PM  Result Value Ref Range Status   Specimen Description URINE, RANDOM  Final   Special Requests NONE  Final   Culture (A)  Final    <10,000 COLONIES/mL INSIGNIFICANT GROWTH Performed at [facility], [address], [place], [place] [address]    Report Status 02/14/2017 FINAL  Final  Culture, Urine     Status: None   Collection Time: 02/20/17  7:54 PM  Result Value Ref Range Status   Specimen Description URINE, RANDOM  Final   Special Requests NONE  Final   Culture   Final    NO GROWTH Performed at [facility], [place] [address] [place], [place] [address]    Report Status 02/22/2017 FINAL  Final       Radiology Studies: Dg Chest 2 View  Result Date: 02/20/2017 CLINICAL DATA:  Known endocarditis. EXAM: CHEST  2 VIEW COMPARISON:   January 24, 2017 chest x-Sherri and CT scan February 13, 2017 FINDINGS: At least 3 nodules remain on the right, 2 which are cavitary. No definitive nodules are seen on the left. Overall, there has been interval improvement. No pneumothorax. The cardiomediastinal silhouette is stable. IMPRESSION: Continued nodules in the right lung, 2 of which are cavitary. No other definitive nodules identified today. The findings are consistent with known septic emboli. Overall, there has been interval improvement. Electronically Signed   By: Dorise Bullion III M.D   On: 02/20/2017 17:08   Ct Head Wo Contrast  Result Date: 02/20/2017 CLINICAL DATA:  Acute onset of headache.  History of heroin abuse. EXAM: CT HEAD WITHOUT CONTRAST TECHNIQUE: Contiguous axial images were obtained from the base of the skull through the vertex without intravenous contrast. COMPARISON:  None. FINDINGS: Brain: No evidence of acute infarction, hemorrhage, hydrocephalus, extra-axial collection or mass lesion/mass effect. Vascular: No hyperdense vessel or unexpected calcification. Skull: Normal. Negative for fracture or focal lesion. Sinuses/Orbits: No acute finding. Other: None. IMPRESSION: 1. No acute intracranial abnormalities.  Normal brain. Electronically Signed   By: Kerby Moors M.D.   On: 02/20/2017 20:05      Scheduled Meds: . cloNIDine  0.1 mg Oral QID   Followed by  . [START ON 02/23/2017] cloNIDine  0.1 mg Oral BH-qamhs   Followed by  . [START ON 02/26/2017] cloNIDine  0.1 mg Oral QAC breakfast  . enoxaparin (LOVENOX) injection  40 mg Subcutaneous QHS  . gabapentin  300 mg Oral QHS   Continuous Infusions: . sodium chloride 75 mL/hr at 02/22/17 0603  . nafcillin IV Stopped (02/22/17 1021)     LOS: 2 days    Time spent: 30 minutes   Dessa Phi, DO [facility] [email or web address] Password [facility] 02/22/2017, 10:55 AM

## 2017-02-22 NOTE — Progress Notes (Signed)
Pt is non cooperative with MD and nursing staff. Pt refused her medications and labs at present time.

## 2017-02-23 LAB — CBC WITH DIFFERENTIAL/PLATELET
BASOS ABS: 0 10*3/uL (ref 0.0–0.1)
Basophils Relative: 0 %
EOS ABS: 0.3 10*3/uL (ref 0.0–0.7)
EOS PCT: 3 %
HCT: 30.4 % — ABNORMAL LOW (ref 36.0–46.0)
Hemoglobin: 10 g/dL — ABNORMAL LOW (ref 12.0–15.0)
Lymphocytes Relative: 26 %
Lymphs Abs: 2.7 10*3/uL (ref 0.7–4.0)
MCH: 27.6 pg (ref 26.0–34.0)
MCHC: 32.9 g/dL (ref 30.0–36.0)
MCV: 84 fL (ref 78.0–100.0)
Monocytes Absolute: 0.6 10*3/uL (ref 0.1–1.0)
Monocytes Relative: 6 %
Neutro Abs: 6.6 10*3/uL (ref 1.7–7.7)
Neutrophils Relative %: 65 %
Platelets: 453 10*3/uL — ABNORMAL HIGH (ref 150–400)
RBC: 3.62 MIL/uL — AB (ref 3.87–5.11)
RDW: 17.6 % — ABNORMAL HIGH (ref 11.5–15.5)
WBC: 10.3 10*3/uL (ref 4.0–10.5)

## 2017-02-23 LAB — BASIC METABOLIC PANEL
ANION GAP: 7 (ref 5–15)
BUN: 11 mg/dL (ref 6–20)
CO2: 25 mmol/L (ref 22–32)
CREATININE: 0.45 mg/dL (ref 0.44–1.00)
Calcium: 7.9 mg/dL — ABNORMAL LOW (ref 8.9–10.3)
Chloride: 107 mmol/L (ref 101–111)
GLUCOSE: 91 mg/dL (ref 65–99)
POTASSIUM: 4.1 mmol/L (ref 3.5–5.1)
SODIUM: 139 mmol/L (ref 135–145)

## 2017-02-23 MED ORDER — ENOXAPARIN SODIUM 30 MG/0.3ML ~~LOC~~ SOLN: 30.0000 mg | Freq: Every day | SUBCUTANEOUS | Status: DC

## 2017-02-23 NOTE — Discharge Summary (Signed)
Physician Discharge Summary  Sherri Francis:[id number] DOB: 11/24/1985 DOA: 02/20/2017  PCP: Patient, No Pcp Per  Admit date: 02/20/2017 Discharge date: 02/23/2017  Admitted From: Home Disposition:  LEFT AGAINST MEDICAL ADVICE    Brief/Interim Summary: Sherri Francis is a 31 yo female with history of IV drug abuse, hepatitis C, nicotine dependency, depression and anxiety with prior suicide attempt who was admitted to the hospital between 8/19-9/8 secondary to MSSA endocarditis with tricuspid valve vegetation and septic pulmonary emboli. At that time, infectious disease was consulted and recommended nafcillin through 9/13. However, patient left AMA on 9/8. She now presents with subjective fevers and night sweats. She continues to abuse heroin.   Echocardiogram was completed which showed persistent and larger tricuspid valve vegetation. Patient was empirically on nafcillin, repeat blood culture was negative to date. On 9/18, patient elected to leave [place]. We discussed risk of leaving without treatment which include disability and death. She was alert, oriented, and lucid at time of discharge. She told me that she accepts these risks and wants to leave; states that she is nearly 31 years old and can make decisions for herself. She also asked me specifically to not call her mother.    Discharge Diagnoses:  Principal Problem:   Opioid type dependence, continuous (HCC) Active Problems:   Fever   Anemia   Opioid abuse with opioid-induced mood disorder (HCC)   Sepsis secondary to suspected endocarditis, septic emboli with incomplete antibiotic therapy  -Repeat blood cultures negative to date, pending final result  -Echocardiogram with persistent TV vegetation  -Infectious disease following  -Treated with nafcillin, but patient now leaving AMA   IV drug abuse with drug withdrawal -UDS positive for opiates, cocaine  -Psychiatry consulted. He has started clonidine opioid  withdrawal protocol and discontinued suboxone. SW consulted   Hypokalemia -Refused placement and repeat labs   Medical noncompliance -Hx of leaving AMA previously    Discharge Instructions   Allergies as of 02/23/2017   No Known Allergies   Med Rec not completed as patient left AMA      No Known Allergies  Consultations:  ID    Procedures/Studies: Dg Chest 2 View  Result Date: 02/20/2017 CLINICAL DATA:  Known endocarditis. EXAM: CHEST  2 VIEW COMPARISON:  January 24, 2017 chest x-ray and CT scan February 13, 2017 FINDINGS: At least 3 nodules remain on the right, 2 which are cavitary. No definitive nodules are seen on the left. Overall, there has been interval improvement. No pneumothorax. The cardiomediastinal silhouette is stable. IMPRESSION: Continued nodules in the right lung, 2 of which are cavitary. No other definitive nodules identified today. The findings are consistent with known septic emboli. Overall, there has been interval improvement. Electronically Signed   By: Dorise Bullion III M.D   On: 02/20/2017 17:08   Ct Head Wo Contrast  Result Date: 02/20/2017 CLINICAL DATA:  Acute onset of headache.  History of heroin abuse. EXAM: CT HEAD WITHOUT CONTRAST TECHNIQUE: Contiguous axial images were obtained from the base of the skull through the vertex without intravenous contrast. COMPARISON:  None. FINDINGS: Brain: No evidence of acute infarction, hemorrhage, hydrocephalus, extra-axial collection or mass lesion/mass effect. Vascular: No hyperdense vessel or unexpected calcification. Skull: Normal. Negative for fracture or focal lesion. Sinuses/Orbits: No acute finding. Other: None. IMPRESSION: 1. No acute intracranial abnormalities.  Normal brain. Electronically Signed   By: Kerby Moors M.D.   On: 02/20/2017 20:05   Ct Chest W Contrast  Result Date: 01/27/2017 CLINICAL DATA:  Cough and fever. Bacteremia. IV drug abuse. Abnormal chest x-ray. EXAM: CT CHEST WITH  CONTRAST TECHNIQUE: Multidetector CT imaging of the chest was performed during intravenous contrast administration. CONTRAST:  64mL ISOVUE-300 IOPAMIDOL (ISOVUE-300) INJECTION 61% COMPARISON:  Chest radiograph 01/24/2017 FINDINGS: Cardiovascular: The heart is normal in size. No pericardial effusion. Thoracic aorta is normal in caliber. Mediastinum/Nodes: Patient motion artifact limits assessment, patient had difficulty tolerating the exam. Enlarged right hilar node measures 10 mm. Prominent right infrahilar node measures 8 mm. There is distal esophageal wall thickening. Visualized thyroid gland is normal. Lungs/Pleura: Multiple bilateral pulmonary nodules, many of which are cavitary consistent with septic emboli. Adjacent lesions in the right upper lobe measure 1.7 and 2.3 cm respectively. Right lower lobe cavitary lesion measures 3.1 cm. Confluent and ground-glass opacity with early cavitation in the lateral segment of the right lower lobe. Cavitary left lower lobe nodule measuring 2.2 x 1.9 cm abuts the pleura. Additional cavitary and non cavitary nodules in both lungs. Minimal right pleural thickening/ fluid. Upper Abdomen: Cyst in the right kidney. No evidence of acute abnormality. Musculoskeletal: Motion artifact limits detailed assessment. There are no acute or suspicious osseous abnormalities. IMPRESSION: 1. Innumerable bilateral cavitary nodules consistent with septic emboli, right greater than left. Minimal right pleural thickening/fluid. 2. Mild right hilar adenopathy is likely reactive. Electronically Signed   By: Jeb Levering M.D.   On: 01/27/2017 23:11   Ct Angio Chest Pe W Or Wo Contrast  Result Date: 02/13/2017 CLINICAL DATA:  Known septic pulmonary emboli. Known tricuspid valve vegetation. History of IV drug abuse. Fever and cough. EXAM: CT ANGIOGRAPHY CHEST WITH CONTRAST TECHNIQUE: Multidetector CT imaging of the chest was performed using the standard protocol during bolus administration of  intravenous contrast. Multiplanar CT image reconstructions and MIPs were obtained to evaluate the vascular anatomy. CONTRAST:  100 mL of Isovue 370 COMPARISON:  Chest x-ray January 24, 2017.  Chest CT January 27, 2017. FINDINGS: Cardiovascular: The heart size is normal. The reported tricuspid valve vegetation is not seen on CT imaging. No coronary artery calcifications are noted. The thoracic aorta is normal in caliber with no dissection. No pulmonary emboli. Mediastinum/Nodes: The prominent right hilar nodes seen previously measures 10 mm, unchanged. No new adenopathy. The esophagus and thyroid are normal. No effusions. A mildly prominent node containing calcifications in the right axilla stable, likely from previous granulomatous infection. Lungs/Pleura: The central airways are within normal limits. No pneumothorax. Numerous cavitary nodules are again identified consistent with history of septic emboli. However, there has been improvement and the numerous nodules are smaller in the interval and the peripheral walls of these nodules are thinner. A few of the nodules have resolved. No new nodules identified. No new infiltrate. Upper Abdomen: No acute abnormality. Musculoskeletal: No chest wall abnormality. No acute or significant osseous findings. Review of the MIP images confirms the above findings. IMPRESSION: 1. Persistent but improving cavitary nodules in the lungs consistent with septic emboli. 2. No pulmonary embolus identified. 3. No other acute abnormalities.  Reactive node in the right hilum. Electronically Signed   By: Dorise Bullion III M.D   On: 02/13/2017 08:57   US Abdomen Complete  Result Date: 02/13/2017 CLINICAL DATA:  Pain EXAM: ABDOMEN ULTRASOUND COMPLETE COMPARISON:  None. FINDINGS: Gallbladder: No gallstones or wall thickening visualized. No sonographic Murphy sign noted by sonographer. Common bile duct: Diameter: 3 mm Liver: No focal lesion identified. Within normal limits in parenchymal  echogenicity. Portal vein is patent on color Doppler imaging with  normal direction of blood flow towards the liver. IVC: No abnormality visualized. Pancreas: Visualized portion unremarkable. Spleen: Size and appearance within normal limits. Right Kidney: Length: 11.3 cm.  12 mm cyst. Left Kidney: Length: 11 cm. Echogenicity within normal limits. No mass or hydronephrosis visualized. Abdominal aorta: No aneurysm visualized. Other findings: None. IMPRESSION: 1. No acute abnormality. Electronically Signed   By: Dorise Bullion III M.D   On: 02/13/2017 11:16   Ct Abdomen Pelvis W Contrast  Result Date: 02/07/2017 CLINICAL DATA:  Acute nausea and non bilious vomiting. History of IV drug abuse. Inpatient. EXAM: CT ABDOMEN AND PELVIS WITH CONTRAST TECHNIQUE: Multidetector CT imaging of the abdomen and pelvis was performed using the standard protocol following bolus administration of intravenous contrast. CONTRAST:  < 75 cc > ISOVUE-300 IOPAMIDOL (ISOVUE-300) INJECTION 61% COMPARISON:  01/31/2017 right upper quadrant abdominal sonogram. 01/13/2016 CT abdomen/pelvis. FINDINGS: Lower chest: Small dependent right pleural effusion, increased. Numerous irregular thick walled cavitary nodules throughout both lung bases, which generally demonstrate increased cavitation and decreased wall thickening since 01/27/2017 chest CT. For example, a 2.2 cm peripheral left lower lobe cavitary nodule (series 6/ image 13) is a mildly decreased from 2.2 cm and demonstrates decreased wall thickness. A 3.0 cm peripheral right lower lobe cavitary nodule (series 6/ image 3) is decreased from 3.7 cm and demonstrates decreased wall thickness and increased cavitation. A 3.8 cm cavitary nodule in the basilar right lower lobe (series 6/image 14) is mildly increased from 3.5 cm, although demonstrates increased cavitation and decreased wall thickness. Hepatobiliary: Normal liver with no liver mass. Normal gallbladder with no radiopaque cholelithiasis.  No biliary ductal dilatation. Common bile duct diameter 5 mm. Pancreas: Normal, with no mass or duct dilation. Spleen: Normal size. No mass. Adrenals/Urinary Tract: Normal adrenals. Hypodense 1.0 cm lateral upper right renal cortical lesion with density 36 HU (series 2/ image 22), stable in size since 01/13/2016, suggesting a benign renal cyst. Additional tiny subcentimeter hypodense lower right and upper left renal cortical lesions are too small to characterize and also stable since 01/13/2016, suggesting benign renal cysts. No new renal lesions. No hydronephrosis. Normal bladder. Stomach/Bowel: Grossly normal stomach. Normal caliber small bowel with no small bowel wall thickening. Normal appendix. Normal large bowel with no diverticulosis, large bowel wall thickening or pericolonic fat stranding. Oral contrast progresses to the distal transverse colon. Vascular/Lymphatic: Normal caliber abdominal aorta. Patent portal, splenic, hepatic and renal veins. No pathologically enlarged lymph nodes in the abdomen or pelvis. Reproductive: Grossly normal uterus. 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 No right adnexal mass. Generalized haziness of the pelvic fat. Other: No pneumoperitoneum, ascites or focal fluid collection. Musculoskeletal: No aggressive appearing focal osseous lesions. IMPRESSION: 1. Numerous cavitary septic emboli at the lung bases, which generally demonstrate decreased wall thickness and increased cavitation since 01/27/2017 chest CT, suggesting treatment response. Small dependent right pleural effusion is mildly increased. Continued post treatment chest imaging follow-up recommended. 2. No evidence of bowel obstruction or acute bowel inflammation. Normal appendix . 3. 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 Nonspecific generalized haziness of the pelvic fat,  which could be a sign of pelvic inflammatory disease versus potentially an inflammatory response to leaking left ovarian teratoma. No pelvic abscess. GYN consultation advised for consideration of resection of the teratoma given the size and growth. Electronically Signed   By: Ilona Sorrel  M.D.   On: 02/07/2017 17:22   Dg Abd Portable 1v  Result Date: 02/03/2017 CLINICAL DATA:  Nausea.  Vomiting.  Constipation. EXAM: PORTABLE ABDOMEN - 1 VIEW COMPARISON:  Abdominal ultrasound of 01/31/2017. FINDINGS: Two supine views. No dilated bowel loops. No abnormal abdominal calcifications. No appendicolith. Right hemidiaphragm elevation. Suspect residual right lower lobe airspace disease. IMPRESSION: No acute findings. Electronically Signed   By: Abigail Miyamoto M.D.   On: 02/03/2017 11:18   US Abdomen Limited Ruq  Result Date: 01/31/2017 CLINICAL DATA:  RIGHT upper quadrant pain with vomiting. EXAM: ULTRASOUND ABDOMEN LIMITED RIGHT UPPER QUADRANT COMPARISON:  01/27/2017. FINDINGS: Gallbladder: No gallstones or wall thickening visualized. No sonographic Murphy sign noted by sonographer. Common bile duct: Diameter: 4 mm, within normal limits. Liver: No focal lesion identified. Within normal limits in parenchymal echogenicity. Portal vein is patent on color Doppler imaging with normal direction of blood flow towards the liver. Compared with prior, no significant change. IMPRESSION: Negative RIGHT upper quadrant ultrasound. No cause seen for the reported symptoms. Electronically Signed   By: Staci Righter M.D.   On: 01/31/2017 14:20   US Abdomen Limited Ruq  Result Date: 01/27/2017 CLINICAL DATA:  Acute right upper quadrant abdominal pain. EXAM: ULTRASOUND ABDOMEN LIMITED RIGHT UPPER QUADRANT COMPARISON:  CT scan of January 13, 2016. FINDINGS: Gallbladder: No gallstones or wall thickening visualized. No sonographic Murphy sign noted by sonographer. Gallbladder does appear to be contracted. Common bile duct: Diameter: 2 mm  which is within normal limits. Liver: No focal lesion identified. Within normal limits in parenchymal echogenicity. Portal vein is patent on color Doppler imaging with normal direction of blood flow towards the liver. IMPRESSION: No abnormality seen in the right upper quadrant of the abdomen. Electronically Signed   By: Marijo Conception, M.D.   On: 01/27/2017 17:39    Echo  Study Conclusions  - Left ventricle: The cavity size was normal. Systolic function was   normal. The estimated ejection fraction was in the range of 55%   to 60%. Wall motion was normal; there were no regional wall   motion abnormalities. Left ventricular diastolic function   parameters were normal. - Aortic valve: Transvalvular velocity was within the normal range.   There was no stenosis. There was no regurgitation. - Mitral valve: Transvalvular velocity was within the normal range.   There was no evidence for stenosis. There was trivial   regurgitation. - Right ventricle: The cavity size was normal. Wall thickness was   normal. Systolic function was normal. - Tricuspid valve: There was a 1.3 cm (W) x 1.6 cm (L) vegetation   on the right atrial aspect of the anterior leaflet. - Pulmonary arteries: Systolic pressure was within the normal   range.  Impressions:  - Compared with the echo 01/28/17, the vegetation previously noted on the anterior leaflet of the tricuspid valve is now larger.   Discharge Exam: Vitals:   02/23/17 0551 02/23/17 0948  BP: 107/72 132/85  Pulse: 74   Resp: 18   Temp: 98.2 F (36.8 C)   SpO2: 100%    Vitals:   02/22/17 2131 02/22/17 2351 02/23/17 0551 02/23/17 0948  BP: 98/61  107/72 132/85  Pulse: 79 78 74   Resp: 18  18   Temp: 97.8 F (36.6 C)  98.2 F (36.8 C)   TempSrc: Oral  Oral   SpO2: 100%  100%   Weight:   49.5 kg (109 lb 2 oz)   Height:  General: Pt is alert, awake, not in acute distress  Cardiovascular: RRR, S1/S2 +, no rubs, no gallops Respiratory:  CTA bilaterally, no wheezing, no rhonchi Abdominal: Soft, NT, ND, bowel sounds + Extremities: no edema, no cyanosis    The results of significant diagnostics from this hospitalization (including imaging, microbiology, ancillary and laboratory) are listed below for reference.     Microbiology: Recent Results (from the past 240 hour(s))  Culture, blood (routine x 2)     Status: None (Preliminary result)   Collection Time: 02/20/17  4:54 PM  Result Value Ref Range Status   Specimen Description BLOOD LEFT WRIST  Final   Special Requests   Final    BOTTLES DRAWN AEROBIC AND ANAEROBIC Blood Culture adequate volume   Culture   Final    NO GROWTH 1 DAY Performed at [facility], [address] [place], [place] [address]    Report Status PENDING  Incomplete  Culture, blood (routine x 2)     Status: None (Preliminary result)   Collection Time: 02/20/17  5:20 PM  Result Value Ref Range Status   Specimen Description BLOOD RIGHT ARM  Final   Special Requests Blood Culture adequate volume  Final   Culture   Final    NO GROWTH 1 DAY Performed at [facility], [place] [address], [place], [place] [address]    Report Status PENDING  Incomplete  Culture, Urine     Status: None   Collection Time: 02/20/17  7:54 PM  Result Value Ref Range Status   Specimen Description URINE, RANDOM  Final   Special Requests NONE  Final   Culture   Final    NO GROWTH Performed at [facility], [place] [address] [place],  [address]    Report Status 02/22/2017 FINAL  Final     Labs: BNP (last 3 results) No results for input(s): BNP in the last 8760 hours. Basic Metabolic Panel:  Recent Labs Lab 02/20/17 1720 02/21/17 0247 02/23/17 0933  NA 140 140 139  K 3.3* 3.3* 4.1  CL 107 108 107  CO2 26 26 25   GLUCOSE 101* 129* 91  BUN 6 11 11   CREATININE 0.46 0.52 0.45  CALCIUM 8.2* 8.0* 7.9*   Liver Function Tests:  Recent Labs Lab 02/20/17 1720 02/21/17 0247  AST 19 19  ALT  17 18  ALKPHOS 65 71  BILITOT 0.4 0.8  PROT 6.7 6.3*  ALBUMIN 2.7* 2.6*   No results for input(s): LIPASE, AMYLASE in the last 168 hours. No results for input(s): AMMONIA in the last 168 hours. CBC:  Recent Labs Lab 02/20/17 1720 02/21/17 0247 02/23/17 0933  WBC 8.3 7.3 10.3  NEUTROABS 2.9  --  6.6  HGB 9.6* 9.9* 10.0*  HCT 29.9* 30.9* 30.4*  MCV 84.0 84.4 84.0  PLT 471* 370 453*   Cardiac Enzymes:  Recent Labs Lab 02/20/17 2158 02/21/17 0247 02/21/17 0948  TROPONINI <0.03 <0.03 <0.03   BNP: Invalid input(s): POCBNP CBG: No results for input(s): GLUCAP in the last 168 hours. D-Dimer No results for input(s): DDIMER in the last 72 hours. Hgb A1c No results for input(s): HGBA1C in the last 72 hours. Lipid Profile No results for input(s): CHOL, HDL, LDLCALC, TRIG, CHOLHDL, LDLDIRECT in the last 72 hours. Thyroid function studies  Recent Labs  02/20/17 2158  TSH 0.613   Anemia work up No results for input(s): VITAMINB12, FOLATE, FERRITIN, TIBC, IRON, RETICCTPCT in the last 72 hours. Urinalysis  Component Value Date/Time   COLORURINE YELLOW 02/20/2017 1953   APPEARANCEUR CLOUDY (A) 02/20/2017 1953   LABSPEC 1.017 02/20/2017 1953   PHURINE 6.0 02/20/2017 1953   GLUCOSEU NEGATIVE 02/20/2017 1953   HGBUR NEGATIVE 02/20/2017 1953   BILIRUBINUR NEGATIVE 02/20/2017 1953   KETONESUR NEGATIVE 02/20/2017 1953   PROTEINUR NEGATIVE 02/20/2017 1953   UROBILINOGEN 0.2 10/15/2012 1341   NITRITE NEGATIVE 02/20/2017 1953   LEUKOCYTESUR SMALL (A) 02/20/2017 1953   Sepsis Labs Invalid input(s): PROCALCITONIN,  WBC,  LACTICIDVEN Microbiology Recent Results (from the past 240 hour(s))  Culture, blood (routine x 2)     Status: None (Preliminary result)   Collection Time: 02/20/17  4:54 PM  Result Value Ref Range Status   Specimen Description BLOOD LEFT WRIST  Final   Special Requests   Final    BOTTLES DRAWN AEROBIC AND ANAEROBIC Blood Culture adequate volume    Culture   Final    NO GROWTH 1 DAY Performed at [facility], [place] [address], [place], [place] [address]    Report Status PENDING  Incomplete  Culture, blood (routine x 2)     Status: None (Preliminary result)   Collection Time: 02/20/17  5:20 PM  Result Value Ref Range Status   Specimen Description BLOOD RIGHT ARM  Final   Special Requests Blood Culture adequate volume  Final   Culture   Final    NO GROWTH 1 DAY Performed at [facility], [place] [address], [place], [place] [address]    Report Status PENDING  Incomplete  Culture, Urine     Status: None   Collection Time: 02/20/17  7:54 PM  Result Value Ref Range Status   Specimen Description URINE, RANDOM  Final   Special Requests NONE  Final   Culture   Final    NO GROWTH Performed at [facility], [place] [address], [place], [place] [address]    Report Status 02/22/2017 FINAL  Final     Time coordinating discharge: < 30 minutes  SIGNED:  Dessa Phi, DO [facility] Pager [phone]  If 7PM-7AM, please contact night-coverage [email or web address] Password [facility] 02/23/2017, 12:51 PM

## 2017-02-23 NOTE — Progress Notes (Signed)
BSW intern contacted residential treatment programs and inquired about bed availability for patient. [facility] reported bed availability.   CSW spoke with patient briefly while she was getting on elevator to leave the hospital AMA. Patient provided with residential substance abuse treatment resources and housing resources. CSW informed patient that [facility] has bed availability. Patient thanked CSW for resources.   Abundio Miu, [place] Social Worker [facility] Cell#: [phone]

## 2017-02-23 NOTE — Progress Notes (Signed)
Patient stated she is leaving against medical advise. Dr. Maylene Roes was notified and attempted to convinced patient to stay. Patient refused to stay. AMA paper was given and explainedto patient. Patient is alert and oriented X 4

## 2017-02-24 ENCOUNTER — Encounter (HOSPITAL_COMMUNITY): Payer: Self-pay | Admitting: *Deleted

## 2017-02-24 ENCOUNTER — Inpatient Hospital Stay (HOSPITAL_COMMUNITY)
Admission: EM | Admit: 2017-02-24 | Discharge: 2017-02-28 | DRG: 289 | Discharge status: Against Medical Advice | Payer: Self-pay | Attending: Internal Medicine | Admitting: Internal Medicine

## 2017-02-24 ENCOUNTER — Emergency Department (HOSPITAL_COMMUNITY): Payer: Self-pay

## 2017-02-24 DIAGNOSIS — I330 Acute and subacute infective endocarditis: Principal | ICD-10-CM | POA: Diagnosis present

## 2017-02-24 DIAGNOSIS — R102 Pelvic and perineal pain: Secondary | ICD-10-CM | POA: Diagnosis present

## 2017-02-24 DIAGNOSIS — I38 Endocarditis, valve unspecified: Secondary | ICD-10-CM | POA: Diagnosis present

## 2017-02-24 DIAGNOSIS — Z8249 Family history of ischemic heart disease and other diseases of the circulatory system: Secondary | ICD-10-CM

## 2017-02-24 DIAGNOSIS — F329 Major depressive disorder, single episode, unspecified: Secondary | ICD-10-CM | POA: Diagnosis present

## 2017-02-24 DIAGNOSIS — F1120 Opioid dependence, uncomplicated: Secondary | ICD-10-CM | POA: Diagnosis present

## 2017-02-24 DIAGNOSIS — B9561 Methicillin susceptible Staphylococcus aureus infection as the cause of diseases classified elsewhere: Secondary | ICD-10-CM | POA: Diagnosis present

## 2017-02-24 DIAGNOSIS — F419 Anxiety disorder, unspecified: Secondary | ICD-10-CM | POA: Diagnosis present

## 2017-02-24 DIAGNOSIS — B182 Chronic viral hepatitis C: Secondary | ICD-10-CM | POA: Diagnosis present

## 2017-02-24 DIAGNOSIS — D509 Iron deficiency anemia, unspecified: Secondary | ICD-10-CM | POA: Diagnosis present

## 2017-02-24 DIAGNOSIS — D638 Anemia in other chronic diseases classified elsewhere: Secondary | ICD-10-CM | POA: Diagnosis present

## 2017-02-24 DIAGNOSIS — F17210 Nicotine dependence, cigarettes, uncomplicated: Secondary | ICD-10-CM | POA: Diagnosis present

## 2017-02-24 DIAGNOSIS — F1010 Alcohol abuse, uncomplicated: Secondary | ICD-10-CM | POA: Diagnosis present

## 2017-02-24 DIAGNOSIS — I76 Septic arterial embolism: Secondary | ICD-10-CM | POA: Diagnosis present

## 2017-02-24 LAB — CBC
HEMATOCRIT: 33.5 % — AB (ref 36.0–46.0)
Hemoglobin: 10.8 g/dL — ABNORMAL LOW (ref 12.0–15.0)
MCH: 27.1 pg (ref 26.0–34.0)
MCHC: 32.2 g/dL (ref 30.0–36.0)
MCV: 84.2 fL (ref 78.0–100.0)
Platelets: 538 10*3/uL — ABNORMAL HIGH (ref 150–400)
RBC: 3.98 MIL/uL (ref 3.87–5.11)
RDW: 17.6 % — AB (ref 11.5–15.5)
WBC: 13.5 10*3/uL — AB (ref 4.0–10.5)

## 2017-02-24 LAB — BASIC METABOLIC PANEL
Anion gap: 8 (ref 5–15)
BUN: 7 mg/dL (ref 6–20)
CHLORIDE: 103 mmol/L (ref 101–111)
CO2: 25 mmol/L (ref 22–32)
Calcium: 8.9 mg/dL (ref 8.9–10.3)
Creatinine, Ser: 0.47 mg/dL (ref 0.44–1.00)
GFR calc Af Amer: >60 mL/min (ref >60)
GFR calc non Af Amer: >60 mL/min (ref >60)
Glucose, Bld: 91 mg/dL (ref 65–99)
POTASSIUM: 4 mmol/L (ref 3.5–5.1)
SODIUM: 136 mmol/L (ref 135–145)

## 2017-02-24 LAB — I-STAT TROPONIN, ED: Troponin i, poc: 0 ng/mL (ref 0.00–0.08)

## 2017-02-24 NOTE — ED Triage Notes (Signed)
Pt arrived via [facility] for chest pain and states that she left AMA from [facility] while being treated for endocarditis and bilateral pneumonia.  Pt is IV drug user and last use was 3 days ago. [place] police accompanying patient because she has warrants out for her arrest

## 2017-02-25 ENCOUNTER — Encounter (HOSPITAL_COMMUNITY): Payer: Self-pay | Admitting: Internal Medicine

## 2017-02-25 DIAGNOSIS — I330 Acute and subacute infective endocarditis: Principal | ICD-10-CM

## 2017-02-25 DIAGNOSIS — I38 Endocarditis, valve unspecified: Secondary | ICD-10-CM | POA: Diagnosis present

## 2017-02-25 LAB — CBC
HEMATOCRIT: 30.4 % — AB (ref 36.0–46.0)
HEMOGLOBIN: 9.6 g/dL — AB (ref 12.0–15.0)
MCH: 26.5 pg (ref 26.0–34.0)
MCHC: 31.6 g/dL (ref 30.0–36.0)
MCV: 84 fL (ref 78.0–100.0)
Platelets: 485 10*3/uL — ABNORMAL HIGH (ref 150–400)
RBC: 3.62 MIL/uL — AB (ref 3.87–5.11)
RDW: 17.7 % — ABNORMAL HIGH (ref 11.5–15.5)
WBC: 10.2 10*3/uL (ref 4.0–10.5)

## 2017-02-25 LAB — BASIC METABOLIC PANEL
Anion gap: 5 (ref 5–15)
BUN: 9 mg/dL (ref 6–20)
CALCIUM: 7.6 mg/dL — AB (ref 8.9–10.3)
CO2: 24 mmol/L (ref 22–32)
Chloride: 110 mmol/L (ref 101–111)
Creatinine, Ser: 0.44 mg/dL (ref 0.44–1.00)
GFR calc Af Amer: >60 mL/min (ref >60)
Glucose, Bld: 98 mg/dL (ref 65–99)
POTASSIUM: 3.7 mmol/L (ref 3.5–5.1)
Sodium: 139 mmol/L (ref 135–145)

## 2017-02-25 LAB — TROPONIN I

## 2017-02-25 LAB — CREATININE, SERUM
Creatinine, Ser: 0.47 mg/dL (ref 0.44–1.00)
GFR calc non Af Amer: >60 mL/min (ref >60)

## 2017-02-25 LAB — HCG, QUANTITATIVE, PREGNANCY: HCG, BETA CHAIN, QUANT, S: 1 m[IU]/mL (ref <5)

## 2017-02-25 MED ORDER — OXYCODONE HCL 5 MG PO TABS
5.0000 mg | ORAL_TABLET | ORAL | Status: DC | PRN
  Administered 2017-02-26 – 2017-02-28 (×14): 5 mg via ORAL
  Filled 2017-02-25 (×14): qty 1

## 2017-02-25 MED ORDER — LORAZEPAM 2 MG/ML IJ SOLN
0.0000 mg | Freq: Four times a day (QID) | INTRAMUSCULAR | Status: AC
  Administered 2017-02-25 (×2): 2 mg via INTRAVENOUS
  Administered 2017-02-26 (×3): 1 mg via INTRAVENOUS
  Administered 2017-02-26: 2 mg via INTRAVENOUS
  Administered 2017-02-27: 1 mg via INTRAVENOUS
  Filled 2017-02-25 (×7): qty 1

## 2017-02-25 MED ORDER — FOLIC ACID 1 MG PO TABS
1.0000 mg | ORAL_TABLET | Freq: Every day | ORAL | Status: DC
  Administered 2017-02-25: 1 mg via ORAL
  Filled 2017-02-25 (×4): qty 1

## 2017-02-25 MED ORDER — LORAZEPAM 2 MG/ML IJ SOLN
0.0000 mg | Freq: Two times a day (BID) | INTRAMUSCULAR | Status: DC
Start: 2017-02-27 — End: 2017-02-28
  Administered 2017-02-27 – 2017-02-28 (×2): 1 mg via INTRAVENOUS
  Filled 2017-02-25 (×3): qty 1

## 2017-02-25 MED ORDER — ONDANSETRON HCL 4 MG PO TABS: 4.0000 mg | ORAL_TABLET | Freq: Four times a day (QID) | ORAL | Status: DC | PRN

## 2017-02-25 MED ORDER — ACETAMINOPHEN 325 MG PO TABS: 650.0000 mg | ORAL_TABLET | Freq: Four times a day (QID) | ORAL | Status: DC | PRN

## 2017-02-25 MED ORDER — OXYCODONE HCL 5 MG PO TABS
10.0000 mg | ORAL_TABLET | Freq: Once | ORAL | Status: AC
  Administered 2017-02-25: 10 mg via ORAL
  Filled 2017-02-25: qty 2

## 2017-02-25 MED ORDER — LORAZEPAM 2 MG/ML IJ SOLN
1.0000 mg | Freq: Four times a day (QID) | INTRAMUSCULAR | Status: AC | PRN
  Administered 2017-02-25 – 2017-02-28 (×8): 1 mg via INTRAVENOUS
  Filled 2017-02-25 (×7): qty 1

## 2017-02-25 MED ORDER — ONDANSETRON HCL 4 MG/2ML IJ SOLN: 4.0000 mg | Freq: Four times a day (QID) | INTRAMUSCULAR | Status: DC | PRN

## 2017-02-25 MED ORDER — VITAMIN B-1 100 MG PO TABS
100.0000 mg | ORAL_TABLET | Freq: Every day | ORAL | Status: DC
  Filled 2017-02-25 (×3): qty 1

## 2017-02-25 MED ORDER — ADULT MULTIVITAMIN W/MINERALS CH
1.0000 | ORAL_TABLET | Freq: Every day | ORAL | Status: DC
  Administered 2017-02-25: 1 via ORAL
  Filled 2017-02-25 (×3): qty 1

## 2017-02-25 MED ORDER — ENOXAPARIN SODIUM 40 MG/0.4ML ~~LOC~~ SOLN
40.0000 mg | Freq: Every day | SUBCUTANEOUS | Status: DC
  Filled 2017-02-25 (×4): qty 0.4

## 2017-02-25 MED ORDER — LORAZEPAM 1 MG PO TABS: 1.0000 mg | ORAL_TABLET | Freq: Four times a day (QID) | ORAL | Status: AC | PRN

## 2017-02-25 MED ORDER — NAFCILLIN SODIUM 2 G IJ SOLR
2.0000 g | INTRAVENOUS | Status: DC
  Administered 2017-02-25 – 2017-02-28 (×22): 2 g via INTRAVENOUS
  Filled 2017-02-25 (×30): qty 2000

## 2017-02-25 MED ORDER — THIAMINE HCL 100 MG/ML IJ SOLN
100.0000 mg | Freq: Every day | INTRAMUSCULAR | Status: DC
  Administered 2017-02-25: 100 mg via INTRAVENOUS
  Filled 2017-02-25: qty 2

## 2017-02-25 MED ORDER — ACETAMINOPHEN 650 MG RE SUPP: 650.0000 mg | Freq: Four times a day (QID) | RECTAL | Status: DC | PRN

## 2017-02-25 NOTE — H&P (Signed)
History and Physical    Sherri Francis EGB:[id number] DOB: 05/08/1986 DOA: 02/24/2017  PCP: Patient, No Pcp Per  Patient coming from: Home.  Chief Complaint: Fever and chills.  HPI: Sherri Francis is a 31 y.o. female with female recently admitted twice for fever and chills and was found to have MSSA endocarditis involving the tricuspid valve signed out AMA 2 days ago presents with complaints of having fever and chills. Patient admits to have taken IV drugs again after the discharge. Patient also has been having pleuritic chest chest pain similar to the one last admission and at that time pulmonary embolism studies were negative.   ED Course: In the ER chest x-ray shows stable cavitary lesions. EKG shows normal sinus rhythm. Patient remained afebrile. Blood work showed leukocytosis and anemia. Patient was restarted on nafcillin after blood cultures obtained.  Review of Systems: As per HPI, rest all negative.   Past Medical History:  Diagnosis Date  . Anemia   . Anxiety   . Depression   . Endocarditis   . Hepatitis C   . IVDU (intravenous drug user)   . PID (acute pelvic inflammatory disease)   . Suicide attempt [facility]    multiple attempts  . Teratoma   . UTI (urinary tract infection)     Past Surgical History:  Procedure Laterality Date  . LAPAROSCOPY       reports that she has been smoking Cigarettes.  She has been smoking about 0.50 packs per day. She has never used smokeless tobacco. She reports that she drinks alcohol. She reports that she uses drugs, including IV.  No Known Allergies  Family History  Problem Relation Age of Onset  . Heart disease Maternal Grandfather   . Anxiety disorder Maternal Grandfather   . Heart disease Maternal Aunt   . Alcohol abuse Father   . Drug abuse Father   . Cancer Neg Hx     Prior to Admission medications   Medication Sig Start Date End Date Taking? Authorizing Provider  cephALEXin (KEFLEX) 500 MG capsule Take 2 capsules (1,000 mg  total) by mouth 2 (two) times daily. Patient not taking: Reported on 02/24/2017 02/11/17 03/04/17  Caren Griffins, MD    Physical Exam: Vitals:   02/24/17 1908 02/24/17 2059 02/24/17 2300 02/24/17 2352  BP: 123/83 126/79 117/80 119/81  Pulse: 95 89 86 85  Resp:  (!) 24 20 18   Temp: 98.6 F (37 C) 98.4 F (36.9 C) 98.6 F (37 C)   TempSrc: Oral Oral Oral   SpO2: 100% 99% 99% 95%      Constitutional: Moderately built and nourished. Vitals:   02/24/17 1908 02/24/17 2059 02/24/17 2300 02/24/17 2352  BP: 123/83 126/79 117/80 119/81  Pulse: 95 89 86 85  Resp:  (!) 24 20 18   Temp: 98.6 F (37 C) 98.4 F (36.9 C) 98.6 F (37 C)   TempSrc: Oral Oral Oral   SpO2: 100% 99% 99% 95%   Eyes: Anicteric no pallor. ENMT: No discharge from the ears eyes nose or mouth. Neck: No mass felt. No JVD appreciated. Respiratory: No rhonchi or crepitations. Cardiovascular: S1-S2 heard no murmurs appreciated. Abdomen: Soft nontender bowel sounds present. Musculoskeletal: No edema. No joint effusion. Skin: Multiple skin lesions. Neurologic: Alert awake oriented to time place and person. Moves all extremities. Psychiatric: Appears normal. Normal affect.   Labs on Admission: I have personally reviewed following labs and imaging studies  CBC:  Recent Labs Lab 02/20/17 1720 02/21/17 0247 02/23/17 3710  02/24/17 1611  WBC 8.3 7.3 10.3 13.5*  NEUTROABS 2.9  --  6.6  --   HGB 9.6* 9.9* 10.0* 10.8*  HCT 29.9* 30.9* 30.4* 33.5*  MCV 84.0 84.4 84.0 84.2  PLT 471* 370 453* 160*   Basic Metabolic Panel:  Recent Labs Lab 02/20/17 1720 02/21/17 0247 02/23/17 0933 02/24/17 1611  NA 140 140 139 136  K 3.3* 3.3* 4.1 4.0  CL 107 108 107 103  CO2 26 26 25 25   GLUCOSE 101* 129* 91 91  BUN 6 11 11 7   CREATININE 0.46 0.52 0.45 0.47  CALCIUM 8.2* 8.0* 7.9* 8.9   GFR: Estimated Creatinine Clearance: 78.3 mL/min (by C-G formula based on SCr of 0.47 mg/dL). Liver Function Tests:  Recent  Labs Lab 02/20/17 1720 02/21/17 0247  AST 19 19  ALT 17 18  ALKPHOS 65 71  BILITOT 0.4 0.8  PROT 6.7 6.3*  ALBUMIN 2.7* 2.6*   No results for input(s): LIPASE, AMYLASE in the last 168 hours. No results for input(s): AMMONIA in the last 168 hours. Coagulation Profile: No results for input(s): INR, PROTIME in the last 168 hours. Cardiac Enzymes:  Recent Labs Lab 02/20/17 2158 02/21/17 0247 02/21/17 0948  TROPONINI <0.03 <0.03 <0.03   BNP (last 3 results) No results for input(s): PROBNP in the last 8760 hours. HbA1C: No results for input(s): HGBA1C in the last 72 hours. CBG: No results for input(s): GLUCAP in the last 168 hours. Lipid Profile: No results for input(s): CHOL, HDL, LDLCALC, TRIG, CHOLHDL, LDLDIRECT in the last 72 hours. Thyroid Function Tests: No results for input(s): TSH, T4TOTAL, FREET4, T3FREE, THYROIDAB in the last 72 hours. Anemia Panel: No results for input(s): VITAMINB12, FOLATE, FERRITIN, TIBC, IRON, RETICCTPCT in the last 72 hours. Urine analysis:    Component Value Date/Time   COLORURINE YELLOW 02/20/2017 1953   APPEARANCEUR CLOUDY (A) 02/20/2017 1953   LABSPEC 1.017 02/20/2017 1953   PHURINE 6.0 02/20/2017 1953   GLUCOSEU NEGATIVE 02/20/2017 1953   HGBUR NEGATIVE 02/20/2017 1953   BILIRUBINUR NEGATIVE 02/20/2017 1953   KETONESUR NEGATIVE 02/20/2017 1953   PROTEINUR NEGATIVE 02/20/2017 1953   UROBILINOGEN 0.2 10/15/2012 1341   NITRITE NEGATIVE 02/20/2017 1953   LEUKOCYTESUR SMALL (A) 02/20/2017 1953   Sepsis Labs: @LABRCNTIP (procalcitonin:4,lacticidven:4) ) Recent Results (from the past 240 hour(s))  Culture, blood (routine x 2)     Status: None (Preliminary result)   Collection Time: 02/20/17  4:54 PM  Result Value Ref Range Status   Specimen Description BLOOD LEFT WRIST  Final   Special Requests   Final    BOTTLES DRAWN AEROBIC AND ANAEROBIC Blood Culture adequate volume   Culture   Final    NO GROWTH 3 DAYS Performed at [facility], [place] [address], [place], [place] [address]    Report Status PENDING  Incomplete  Culture, blood (routine x 2)     Status: None (Preliminary result)   Collection Time: 02/20/17  5:20 PM  Result Value Ref Range Status   Specimen Description BLOOD RIGHT ARM  Final   Special Requests Blood Culture adequate volume  Final   Culture   Final    NO GROWTH 3 DAYS Performed at [facility], [place] [address], [place],  [address]    Report Status PENDING  Incomplete  Culture, Urine     Status: None   Collection Time: 02/20/17  7:54 PM  Result Value Ref Range Status   Specimen Description URINE, RANDOM  Final  Special Requests NONE  Final   Culture   Final    NO GROWTH Performed at [facility], [place] [address], [place], [place] [address]    Report Status 02/22/2017 FINAL  Final     Radiological Exams on Admission: Dg Chest 2 View  Result Date: 02/24/2017 CLINICAL DATA:  Chest pain and history of endocarditis EXAM: CHEST  2 VIEW COMPARISON:  02/20/2017 FINDINGS: Cardiac shadow is stable. The lungs are well aerated bilaterally. Multiple small cavitary lesions are noted within the right lung similar to that noted on the prior exam. No new focal abnormality is noted. No effusion is seen. No bony abnormality is noted. IMPRESSION: Stable cavitary lesions within the right lung. No new focal abnormality is seen. Electronically Signed   By: Inez Catalina M.D.   On: 02/24/2017 16:34    EKG: Independently reviewed. Normal sinus rhythm.  Assessment/Plan Principal Problem:   Acute bacterial endocarditis Active Problems:   Opioid type dependence, continuous (HCC)   Anemia   Endocarditis    1. Fever and chills with recent bacterial endocarditis involving the tricuspid valve with MSSA bacteremia - blood cultures have been sent again. Patient is restarted on nafcillin. May consult infectious disease name. 2. IV drug abuse and alcohol abuse - patient is placed on CIWA  protocol. 3. Iron deficiency anemia and anemia of chronic disease - patient has previously refused iron supplements due to nausea. Follow CBC. 4. Pleuritic-type of chest pain - EKG unremarkable will cycle cardiac markers recent studies for PE were negative.  5. History of hepatitis C - further workup as outpatient.  I have reviewed patient's old charts and labs.   DVT prophylaxis: Lovenox. Code Status: Full code.  Family Communication: Discussed with patient.  Disposition Plan: Home.  Consults called: None.  Admission status: Inpatient.    Rise Patience MD [facility] Pager [phone].  If 7PM-7AM, please contact night-coverage [email or web address] Password [email or web address]  02/25/2017, 12:21 AM

## 2017-02-25 NOTE — ED Notes (Signed)
Pt walked to the bathroom no problems.   Pt ate all of breakfast

## 2017-02-25 NOTE — ED Provider Notes (Signed)
[facility] DEPT Provider Note   CSN: [id number] Arrival date & time: 02/24/17  1547     History   Chief Complaint Chief Complaint  Patient presents with  . Chest Pain    HPI Sherri Francis is a 31 y.o. female.  The history is provided by the patient.  Chest Pain   This is a new problem. The current episode started 12 to 24 hours ago. The problem occurs constantly. The problem has been resolved. The pain is present in the substernal region. The pain is moderate. The quality of the pain is described as sharp. The pain does not radiate. Associated symptoms include diaphoresis, a fever and shortness of breath. Pertinent negatives include no hemoptysis. She has tried nothing for the symptoms.  pt presents to the ER after leaving AMA She had been admitted for endocarditis due to IVDA but she decided to leave hospital early Over the past day since going home she has had fever/chills/sweats She also reports sharp chest pain earlier in that day that is resolved She also reports using a "washout" from a spoon and injecting since her last admission  She is here with police as soon as she is discharged she will be arrested Past Medical History:  Diagnosis Date  . Anemia   . Anxiety   . Depression   . Endocarditis   . Hepatitis C   . IVDU (intravenous drug user)   . PID (acute pelvic inflammatory disease)   . Suicide attempt [facility])    multiple attempts  . Teratoma   . UTI (urinary tract infection)     Patient Active Problem List   Diagnosis Date Noted  . Opioid abuse with opioid-induced mood disorder ([place]) 02/21/2017  . Fever 02/20/2017  . Anemia 02/20/2017  . Acute bacterial endocarditis   . Substance induced mood disorder ([place]) 01/30/2017  . Opioid type dependence, continuous ([place]) 01/30/2017  . 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 Bacteremia due to Staphylococcus aureus 01/27/2017  . Pneumonia  01/24/2017  . Pelvic abscess in female 01/13/2016  . PID (acute pelvic inflammatory disease) 01/13/2016    Past Surgical History:  Procedure Laterality Date  . LAPAROSCOPY      OB History    Gravida Para Term Preterm AB Living   0 0 0 0 0 0   SAB TAB Ectopic Multiple Live Births   0 0 0 0 0       Home Medications    Prior to Admission medications   Medication Sig Start Date End Date Taking? Authorizing Provider  cephALEXin (KEFLEX) 500 MG capsule Take 2 capsules (1,000 mg total) by mouth 2 (two) times daily. Patient not taking: Reported on 02/24/2017 02/11/17 03/04/17  Caren Griffins, MD    Family History Family History  Problem Relation Age of Onset  . Heart disease Maternal Grandfather   . Anxiety disorder Maternal Grandfather   . Heart disease Maternal Aunt   . Alcohol abuse Father   . Drug abuse Father   . Cancer Neg Hx     Social History Social History  Substance Use Topics  . Smoking status: Current Every Day Smoker    Packs/day: 0.50    Types: Cigarettes  . Smokeless tobacco: Never Used  . Alcohol use Yes     Comment: 6 beers perday- 2 years ago was last time hx of alcoholism per pt.     Allergies   Patient has no known  allergies.   Review of Systems Review of Systems  Constitutional: Positive for chills, diaphoresis, fatigue and fever.  Respiratory: Positive for shortness of breath. Negative for hemoptysis.   Cardiovascular: Positive for chest pain.  Gastrointestinal: Positive for diarrhea.  All other systems reviewed and are negative.    Physical Exam Updated Vital Signs BP 119/81   Pulse 85   Temp 98.6 F (37 C) (Oral)   Resp 18   LMP 02/13/2017 (Approximate) Comment: negative pregnancy test result on admission.  SpO2 95%   Physical Exam CONSTITUTIONAL: Disheveled, anxious HEAD: Normocephalic/atraumatic EYES: EOMI/PERRL ENMT: Mucous membranes moist NECK: supple no meningeal signs SPINE/BACK:entire spine nontender CV: S1/S2  noted, no murmurs/rubs/gallops noted, no loud murmurs LUNGS: Lungs are clear to auscultation bilaterally, no apparent distress ABDOMEN: soft, nontender  GU:no cva tenderness NEURO: Pt is awake/alert/appropriate, moves all extremitiesx4.  No facial droop.   EXTREMITIES: pulses normal/equal, full ROM, bruising to extremities and healed track marks but no cellulitis or abscess noted SKIN: warm, color normal PSYCH: anxious  ED Treatments / Results  Labs (all labs ordered are listed, but only abnormal results are displayed) Labs Reviewed  CBC - Abnormal; Notable for the following:       Result Value   WBC 13.5 (*)    Hemoglobin 10.8 (*)    HCT 33.5 (*)    RDW 17.6 (*)    Platelets 538 (*)    All other components within normal limits  CULTURE, BLOOD (ROUTINE X 2)  CULTURE, BLOOD (ROUTINE X 2)  BASIC METABOLIC PANEL  PREGNANCY, URINE  BASIC METABOLIC PANEL  CBC  CBC  CREATININE, SERUM  I-STAT TROPONIN, ED    EKG  EKG Interpretation  Date/Time:  Wednesday February 24 2017 16:01:35 EDT Ventricular Rate:  100 PR Interval:  136 QRS Duration: 72 QT Interval:  352 QTC Calculation: 454 R Axis:   85 Text Interpretation:  Normal sinus rhythm Normal ECG No significant change since last tracing Confirmed by Ripley Fraise [phone]) on 02/24/2017 11:10:30 PM       Radiology Dg Chest 2 View  Result Date: 02/24/2017 CLINICAL DATA:  Chest pain and history of endocarditis EXAM: CHEST  2 VIEW COMPARISON:  02/20/2017 FINDINGS: Cardiac shadow is stable. The lungs are well aerated bilaterally. Multiple small cavitary lesions are noted within the right lung similar to that noted on the prior exam. No new focal abnormality is noted. No effusion is seen. No bony abnormality is noted. IMPRESSION: Stable cavitary lesions within the right lung. No new focal abnormality is seen. Electronically Signed   By: Inez Catalina M.D.   On: 02/24/2017 16:34    Procedures Procedures (including critical care  time)  Medications Ordered in ED Medications  nafcillin 1 g in dextrose 5 % 50 mL IVPB (not administered)  acetaminophen (TYLENOL) tablet 650 mg (not administered)    Or  acetaminophen (TYLENOL) suppository 650 mg (not administered)  ondansetron (ZOFRAN) tablet 4 mg (not administered)    Or  ondansetron (ZOFRAN) injection 4 mg (not administered)  enoxaparin (LOVENOX) injection 40 mg (not administered)     Initial Impression / Assessment and Plan / ED Course  I have reviewed the triage vital signs and the nursing notes.  Pertinent labs & imaging results that were available during my care of the patient were reviewed by me and considered in my medical decision making (see chart for details).     Pt just left hospital AMA She now wants to be admitted Per ID recs,  if TTE revealed worsened vegetation on tricuspid she needs IV antibiotics TTE on 9/16 shows worsened vegetation Will admit I explained in great detail to patient via pictures and descriptions what is happening to her body due to IVDA and advised her to quit  D/w dr Hal Hope for admission   Final Clinical Impressions(s) / ED Diagnoses   Final diagnoses:  Acute bacterial endocarditis    New Prescriptions New Prescriptions   No medications on file     Ripley Fraise, MD 02/25/17 [phone]

## 2017-02-25 NOTE — ED Notes (Signed)
Regular Diet has been ordered for Dinner. 

## 2017-02-25 NOTE — Progress Notes (Signed)
Sherri Francis is a 31 y.o. female with female recently admitted twice for fever and chills and was found to have MSSA endocarditis involving the tricuspid valve signed out AMA 2 days ago presents with complaints of having fever and chills. Patient was started on nafcillin after blood cultures obtained.    Plan:  1. MSSA bacteremia on nafcillin.  2. Follow up blood cultures.  3. Alcohol abuse on CIWA protocol.  4. Substance abuse- heroin use in the last 3 days.    Hosie Poisson, MD [phone]
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
## 2017-02-25 NOTE — ED Notes (Addendum)
Regular Diet ordered for Lunch. 

## 2017-02-25 NOTE — ED Notes (Signed)
Lunch tray given, sitting up in bed no complaints at present.

## 2017-02-25 NOTE — Plan of Care (Signed)
Problem: Safety: Goal: Ability to remain free from injury will improve Outcome: Progressing Tolerates ambulation in room

## 2017-02-26 DIAGNOSIS — D649 Anemia, unspecified: Secondary | ICD-10-CM

## 2017-02-26 DIAGNOSIS — F1120 Opioid dependence, uncomplicated: Secondary | ICD-10-CM

## 2017-02-26 LAB — CULTURE, BLOOD (ROUTINE X 2)
CULTURE: NO GROWTH
Culture: NO GROWTH
Special Requests: ADEQUATE
Special Requests: ADEQUATE

## 2017-02-26 MED ORDER — MORPHINE SULFATE (PF) 2 MG/ML IV SOLN
1.0000 mg | INTRAVENOUS | Status: DC | PRN
  Administered 2017-02-26 – 2017-02-28 (×21): 1 mg via INTRAVENOUS
  Filled 2017-02-26 (×21): qty 1

## 2017-02-26 MED ORDER — NICOTINE 21 MG/24HR TD PT24
21.0000 mg | MEDICATED_PATCH | Freq: Every day | TRANSDERMAL | Status: DC
  Administered 2017-02-26 – 2017-02-28 (×3): 21 mg via TRANSDERMAL
  Filled 2017-02-26 (×3): qty 1

## 2017-02-26 MED ORDER — KETOROLAC TROMETHAMINE 15 MG/ML IJ SOLN
15.0000 mg | Freq: Four times a day (QID) | INTRAMUSCULAR | Status: DC
  Administered 2017-02-26 – 2017-02-28 (×8): 15 mg via INTRAVENOUS
  Filled 2017-02-26 (×8): qty 1

## 2017-02-26 MED ORDER — FAMOTIDINE 20 MG PO TABS
40.0000 mg | ORAL_TABLET | Freq: Two times a day (BID) | ORAL | Status: DC
  Administered 2017-02-27 – 2017-02-28 (×2): 40 mg via ORAL
  Filled 2017-02-26 (×4): qty 2
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
## 2017-02-26 NOTE — Progress Notes (Signed)
PROGRESS NOTE    Sherri Francis  ALP:[id number] DOB: 10/21/1985 DOA: 02/24/2017 PCP: Patient, No Pcp Per    Brief Narrative:  31 year old female who presented with fevers and chills. Patient was recently admitted for MSSA endocarditis involving the tricuspid valve, she signed herself AGAINST MEDICAL ADVICE, 2 days prior to the current hospitalization. She admitted using IV drugs after discharge. On her initial physical examination blood pressure 123/83, heart rate 95, respiratory 24, temperature 98.6, oxygen saturation 100%. Moist mucous membranes, lungs clear to auscultation bilaterally, heart S1, S2 present and rhythmic, the abdomen was soft and nontender, no lower extremity edema.  Patient was admitted with working diagnosis of fevers and chills likely related to MSSA bacteremia/endocarditis.   Assessment & Plan:   Principal Problem:   Acute bacterial endocarditis Active Problems:   Opioid type dependence, continuous (HCC)   Anemia   Endocarditis   1. MSSA endocarditis. Will continue antibiotic therapy with good toleration, patient has remained hemodynamically stable, continue IV nafcillin and continue to follow up on ID recommendations, follow up cultures continue to be no growth.   2. IV drug abuse. No signs of active withdrawal, will continue close monitoring and as needed benzodiazepines. Patient awake and alert. Will continue with as needed lorazepam and thiamine.   3. Pelvic pain. Acute on chronic, patient reports chronic pelvic pain, will continue analgesia with IV morphine and IV ketorolac. Will need close follow up, on examination the abdomen is soft with no guarding or rebound.   3.  Iron deficiency. Stable, no signs of bleeding, continue iron supplements.  4. Hepatitis C. Stable with no signs of liver failure, will need follow up as outpatient.    DVT prophylaxis: scd  Code Status: Full Family Communication:  Disposition Plan:    Consultants:     Procedures:       Antimicrobials:       Subjective: Patient with significant pelvic pain, acute on chronic, sharp in nature. 10-10 intensity, no associated nausea or vomiting, no improving or worsening factors, no radiation.  Objective: Vitals:   02/26/17 0340 02/26/17 0516 02/26/17 0642 02/26/17 1100  BP: 101/63 114/75    Pulse:  (!) 108 97 93  Resp:  (!) 21    Temp:  98.1 F (36.7 C)    TempSrc:  Oral    SpO2:  100%    Weight:  46.3 kg (102 lb)    Height:        Intake/Output Summary (Last 24 hours) at 02/26/17 1136 Last data filed at 02/25/17 1700  Gross per 24 hour  Intake              340 ml  Output                0 ml  Net              340 ml   Filed Weights   02/25/17 1649 02/26/17 0516  Weight: 46.5 kg (102 lb 9.6 oz) 46.3 kg (102 lb)    Examination:  General: in pain, deconditioned Neurology: Awake and alert, non focal  E ENT: mil pallor, no icterus, oral mucosa moist Cardiovascular: No JVD. S1-S2 present, rhythmic, no gallops, rubs, or murmurs. No lower extremity edema. Pulmonary: vesicular breath sounds bilaterally, adequate air movement, no wheezing, rhonchi or rales. Gastrointestinal. Abdomen flat, no organomegaly, non tender, no rebound or guarding Skin. No rashes Musculoskeletal: no joint deformities     Data Reviewed: I have personally reviewed following labs and imaging  studies  CBC:  Recent Labs Lab 02/20/17 1720 02/21/17 0247 02/23/17 0933 02/24/17 1611 02/25/17 0020  WBC 8.3 7.3 10.3 13.5* 10.2  NEUTROABS 2.9  --  6.6  --   --   HGB 9.6* 9.9* 10.0* 10.8* 9.6*  HCT 29.9* 30.9* 30.4* 33.5* 30.4*  MCV 84.0 84.4 84.0 84.2 84.0  PLT 471* 370 453* 538* 469*   Basic Metabolic Panel:  Recent Labs Lab 02/20/17 1720 02/21/17 0247 02/23/17 0933 02/24/17 1611 02/25/17 0020 02/25/17 0430  NA 140 140 139 136  --  139  K 3.3* 3.3* 4.1 4.0  --  3.7  CL 107 108 107 103  --  110  CO2 26 26 25 25   --  24  GLUCOSE 101* 129* 91 91  --  98  BUN 6  11 11 7   --  9  CREATININE 0.46 0.52 0.45 0.47 0.47 0.44  CALCIUM 8.2* 8.0* 7.9* 8.9  --  7.6*   GFR: Estimated Creatinine Clearance: 75.8 mL/min (by C-G formula based on SCr of 0.44 mg/dL). Liver Function Tests:  Recent Labs Lab 02/20/17 1720 02/21/17 0247  AST 19 19  ALT 17 18  ALKPHOS 65 71  BILITOT 0.4 0.8  PROT 6.7 6.3*  ALBUMIN 2.7* 2.6*   No results for input(s): LIPASE, AMYLASE in the last 168 hours. No results for input(s): AMMONIA in the last 168 hours. Coagulation Profile: No results for input(s): INR, PROTIME in the last 168 hours. Cardiac Enzymes:  Recent Labs Lab 02/21/17 0247 02/21/17 0948 02/25/17 0430 02/25/17 0852 02/25/17 1652  TROPONINI <0.03 <0.03 <0.03 <0.03 <0.03   BNP (last 3 results) No results for input(s): PROBNP in the last 8760 hours. HbA1C: No results for input(s): HGBA1C in the last 72 hours. CBG: No results for input(s): GLUCAP in the last 168 hours. Lipid Profile: No results for input(s): CHOL, HDL, LDLCALC, TRIG, CHOLHDL, LDLDIRECT in the last 72 hours. Thyroid Function Tests: No results for input(s): TSH, T4TOTAL, FREET4, T3FREE, THYROIDAB in the last 72 hours. Anemia Panel: No results for input(s): VITAMINB12, FOLATE, FERRITIN, TIBC, IRON, RETICCTPCT in the last 72 hours.    Radiology Studies: I have reviewed all of the imaging during this hospital visit personally     Scheduled Meds: . enoxaparin (LOVENOX) injection  40 mg Subcutaneous Daily  . folic acid  1 mg Oral Daily  . LORazepam  0-4 mg Intravenous Q6H   Followed by  . [START ON 02/27/2017] LORazepam  0-4 mg Intravenous Q12H  . multivitamin with minerals  1 tablet Oral Daily  . thiamine  100 mg Oral Daily   Or  . thiamine  100 mg Intravenous Daily   Continuous Infusions: . nafcillin IV 2 g (02/26/17 1114)     LOS: 1 day        Dock Baccam Gerome Apley, MD [facility] Pager [phone]

## 2017-02-26 NOTE — Plan of Care (Signed)
Problem: Pain Managment: Goal: General experience of comfort will improve Outcome: Not Progressing Pt c/o generalized pelvic, back, and leg pain. Per pt chronic but currently increased. PRN pain medications and ativan administered. Slight relief from discomfort but pt still rating pain 8-9. Will continue to monitor.  Problem: Physical Regulation: Goal: Ability to maintain clinical measurements within normal limits will improve Outcome: Progressing Pt afebrile throughout shift. Still intermittent tachycardia. Pain treated w/ PRN medication. CIWA assessed as ordered. Pt having s/s of withdrawal. Medication administered per score. Will continue to monitor.

## 2017-02-26 NOTE — Progress Notes (Signed)
Pt c/o pain 9/10 back and pelvis. Chronic pain per per pt. Unable to take Tylenol. Provider on cal notified. New orders placed. Will continue to monitor.

## 2017-02-27 LAB — BASIC METABOLIC PANEL
Anion gap: 8 (ref 5–15)
BUN: 19 mg/dL (ref 6–20)
CALCIUM: 8.3 mg/dL — AB (ref 8.9–10.3)
CO2: 23 mmol/L (ref 22–32)
CREATININE: 0.56 mg/dL (ref 0.44–1.00)
Chloride: 103 mmol/L (ref 101–111)
GFR calc Af Amer: >60 mL/min (ref >60)
GFR calc non Af Amer: >60 mL/min (ref >60)
GLUCOSE: 108 mg/dL — AB (ref 65–99)
Potassium: 3.9 mmol/L (ref 3.5–5.1)
Sodium: 134 mmol/L — ABNORMAL LOW (ref 135–145)

## 2017-02-27 LAB — CBC WITH DIFFERENTIAL/PLATELET
Basophils Absolute: 0 10*3/uL (ref 0.0–0.1)
Basophils Relative: 0 %
Eosinophils Absolute: 0.3 10*3/uL (ref 0.0–0.7)
Eosinophils Relative: 4 %
HEMATOCRIT: 31.4 % — AB (ref 36.0–46.0)
HEMOGLOBIN: 9.9 g/dL — AB (ref 12.0–15.0)
LYMPHS ABS: 2.2 10*3/uL (ref 0.7–4.0)
LYMPHS PCT: 31 %
MCH: 26.9 pg (ref 26.0–34.0)
MCHC: 31.5 g/dL (ref 30.0–36.0)
MCV: 85.3 fL (ref 78.0–100.0)
MONOS PCT: 10 %
Monocytes Absolute: 0.7 10*3/uL (ref 0.1–1.0)
NEUTROS ABS: 4 10*3/uL (ref 1.7–7.7)
NEUTROS PCT: 55 %
PLATELETS: 453 10*3/uL — AB (ref 150–400)
RBC: 3.68 MIL/uL — AB (ref 3.87–5.11)
RDW: 17.5 % — AB (ref 11.5–15.5)
WBC: 7.2 10*3/uL (ref 4.0–10.5)

## 2017-02-27 NOTE — Progress Notes (Signed)
Patient ID: Sherri Francis, female   DOB: 1986-03-20, 31 y.o.   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 She was supposed to finish abx till 9/13 but left AMA on 9/8. She returned to ED 9/15 and then left again [facility] 9/18. She again returned 9/18 with pleuritic chest pain. CXR showed stable cavitary lesions.    Assessment & Plan:   Principal Problem:   Acute bacterial MSSA endocarditis - Continue nafcillin  Active Problems:   Opioid type dependence, continuous (HCC) / IVDA - No signs of acute withdrawal    Hepatitis C - Outpt follow up    Tobacco use - Counseled on cessation  - Continue nicotine patch   DVT prophylaxis: Lovenox subQ Code Status: full code  Family Communication: no family at the bedside Disposition Plan: home once cleared by ID   Consultants:   ID  Procedures:   None  Antimicrobials:   Nafcillin -->    Subjective: No overnight events.  Objective: Vitals:   02/26/17 2055 02/27/17 0044 02/27/17 0150 02/27/17 0645  BP: 112/85 102/69 98/63 106/60  Pulse:  (!) 103    Resp: 18     Temp:   97.9 F (36.6 C)   TempSrc:   Oral   SpO2:   99% 99%  Weight:      Height:        Intake/Output Summary (Last 24 hours) at 02/27/17 1031 Last data filed at 02/27/17 0228  Gross per 24 hour  Intake             1020 ml  Output                0 ml  Net             1020 ml   Filed Weights   02/25/17 1649 02/26/17 0516  Weight: 46.5 kg (102 lb 9.6 oz) 46.3 kg (102 lb)    Examination:  General exam: Appears calm and comfortable  Respiratory system: Clear to auscultation. Respiratory effort  normal. Cardiovascular system: S1 & S2 heard, RRR. Gastrointestinal system: Abdomen is nondistended, soft and nontender. No organomegaly or masses felt. Normal bowel sounds heard. Central nervous system: Alert and oriented. No focal neurological deficits. Extremities: Symmetric 5 x 5 power. Skin: No rashes, lesions or ulcers Psychiatry: Judgement and insight appear normal. Mood & affect appropriate.   Data Reviewed: I have personally reviewed following labs and imaging studies  CBC:  Recent Labs Lab 02/20/17 1720 02/21/17 0247 02/23/17 0933 02/24/17 1611 02/25/17 0020 02/27/17 0724  WBC 8.3 7.3 10.3 13.5* 10.2 7.2  NEUTROABS 2.9  --  6.6  --   --  4.0  HGB 9.6* 9.9* 10.0* 10.8* 9.6* 9.9*  HCT 29.9* 30.9* 30.4* 33.5* 30.4* 31.4*  MCV 84.0 84.4 84.0 84.2 84.0 85.3  PLT 471* 370 453* 538* 485* 277*   Basic Metabolic Panel:  Recent Labs Lab 02/21/17 0247 02/23/17 0933 02/24/17 1611 02/25/17 0020 02/25/17 0430 02/27/17 0724  NA 140 139 136  --  139 134*  K 3.3* 4.1 4.0  --  3.7 3.9  CL 108 107 103  --  110 103  CO2 26 25 25   --  24 23  GLUCOSE 129* 91 91  --  98 108*  BUN 11 11 7   --  9 19  CREATININE 0.52 0.45 0.47 0.47 0.44 0.56  CALCIUM 8.0* 7.9* 8.9  --  7.6* 8.3*   GFR: Estimated Creatinine Clearance: 75.8 mL/min (by C-G formula based on SCr of 0.56 mg/dL). Liver Function Tests:  Recent Labs Lab 02/20/17 1720 02/21/17 0247  AST 19 19  ALT 17 18  ALKPHOS 65 71  BILITOT 0.4 0.8  PROT 6.7 6.3*  ALBUMIN 2.7* 2.6*   No results for input(s): LIPASE, AMYLASE in the last 168 hours. No results for input(s): AMMONIA in the last 168 hours. Coagulation Profile: No results for input(s): INR, PROTIME in the last 168 hours. Cardiac Enzymes:  Recent Labs Lab 02/21/17 0247 02/21/17 0948 02/25/17 0430 02/25/17 0852 02/25/17 1652  TROPONINI <0.03 <0.03 <0.03 <0.03 <0.03   BNP (last 3 results) No results for input(s): PROBNP in the last 8760 hours. HbA1C: No  results for input(s): HGBA1C in the last 72 hours. CBG: No results for input(s): GLUCAP in the last 168 hours. Lipid Profile: No results for input(s): CHOL, HDL, LDLCALC, TRIG, CHOLHDL, LDLDIRECT in the last 72 hours. Thyroid Function Tests: No results for input(s): TSH, T4TOTAL, FREET4, T3FREE, THYROIDAB in the last 72 hours. Anemia Panel: No results for input(s): VITAMINB12, FOLATE, FERRITIN, TIBC, IRON, RETICCTPCT in the last 72 hours. Urine analysis:    Component Value Date/Time   COLORURINE YELLOW 02/20/2017 1953   APPEARANCEUR CLOUDY (A) 02/20/2017 1953   LABSPEC 1.017 02/20/2017 1953   PHURINE 6.0 02/20/2017 1953   GLUCOSEU NEGATIVE 02/20/2017 1953   HGBUR NEGATIVE 02/20/2017 1953   BILIRUBINUR NEGATIVE 02/20/2017 1953   KETONESUR NEGATIVE 02/20/2017 1953   PROTEINUR NEGATIVE 02/20/2017 1953   UROBILINOGEN 0.2 10/15/2012 1341   NITRITE NEGATIVE 02/20/2017 1953   LEUKOCYTESUR SMALL (A) 02/20/2017 1953   Sepsis Labs: @LABRCNTIP (procalcitonin:4,lacticidven:4)   ) Recent Results (from the past 240 hour(s))  Culture, blood (routine x 2)     Status: None   Collection Time: 02/20/17  4:54 PM  Result Value Ref Range Status   Specimen Description BLOOD LEFT WRIST  Final   Special Requests   Final    BOTTLES DRAWN AEROBIC AND ANAEROBIC Blood Culture adequate volume   Culture   Final    NO GROWTH 5 DAYS Performed at [facility], [place] [address], [place], [place] [address]    Report Status 02/26/2017 FINAL  Final  Culture, blood (routine x 2)     Status: None   Collection Time: 02/20/17  5:20 PM  Result Value Ref Range Status   Specimen Description BLOOD RIGHT ARM  Final   Special Requests Blood Culture adequate volume  Final   Culture   Final    NO GROWTH 5 DAYS Performed at [facility], [place] [address], [place], [place] [address]    Report Status 02/26/2017 FINAL  Final  Culture, Urine     Status: None   Collection Time: 02/20/17  7:54 PM  Result Value  Ref Range Status   Specimen Description URINE, RANDOM  Final   Special Requests NONE  Final   Culture   Final    NO GROWTH Performed at [facility], [place] [address] [place],  [address]    Report Status 02/22/2017 FINAL  Final  Blood culture (routine x 2)     Status: None (Preliminary result)  Collection Time: 02/24/17 11:50 PM  Result Value Ref Range Status   Specimen Description BLOOD RIGHT HAND  Final   Special Requests   Final    BOTTLES DRAWN AEROBIC AND ANAEROBIC Blood Culture adequate volume   Culture NO GROWTH 1 DAY  Final   Report Status PENDING  Incomplete  Blood culture (routine x 2)     Status: None (Preliminary result)   Collection Time: 02/25/17 12:20 AM  Result Value Ref Range Status   Specimen Description BLOOD LEFT HAND  Final   Special Requests   Final    BOTTLES DRAWN AEROBIC AND ANAEROBIC Blood Culture adequate volume   Culture NO GROWTH 1 DAY  Final   Report Status PENDING  Incomplete      Radiology Studies: Dg Chest 2 View  Result Date: 02/24/2017 CLINICAL DATA:  Chest pain and history of endocarditis EXAM: CHEST  2 VIEW COMPARISON:  02/20/2017 FINDINGS: Cardiac shadow is stable. The lungs are well aerated bilaterally. Multiple small cavitary lesions are noted within the right lung similar to that noted on the prior exam. No new focal abnormality is noted. No effusion is seen. No bony abnormality is noted. IMPRESSION: Stable cavitary lesions within the right lung. No new focal abnormality is seen. Electronically Signed   By: Inez Catalina M.D.   On: 02/24/2017 16:34      Scheduled Meds: . enoxaparin (LOVENOX) injection  40 mg Subcutaneous Daily  . famotidine  40 mg Oral BID  . folic acid  1 mg Oral Daily  . ketorolac  15 mg Intravenous Q6H  . LORazepam  0-4 mg Intravenous Q12H  . multivitamin with minerals  1 tablet Oral Daily  . nicotine  21 mg Transdermal Daily  . thiamine  100 mg Oral Daily   Or  . thiamine  100 mg Intravenous Daily    Continuous Infusions: . nafcillin IV 2 g (02/27/17 0617)     LOS: 2 days    Time spent: 25 minutes  Greater than 50% of the time spent on counseling and coordinating the care.   Leisa Lenz, MD [facility] Pager [phone]  If 7PM-7AM, please contact night-coverage [email or web address] Password [facility] 02/27/2017, 10:31 AM

## 2017-02-28 NOTE — Progress Notes (Signed)
Patient ID: Sherri Francis, female   DOB: [birth year], 31 y.o.   MRN: [id number]  PROGRESS NOTE    Sherri Francis  OFH:[id number] DOB: 07/20/1985 DOA: 02/24/2017  PCP: Patient, No Pcp Per   Brief Narrative:  31 year old female with chronic hepatitis C without hepatic coma, history of IVDA, depression, anxiety, admitted in august for MSSA +/- strep viridans TV endocarditis with vegetation of 1.5 x 1.2 cm with pulmonary septic emboli and cavitary lesions. She was supposed to finish abx till 9/13 but left AMA on 9/8. She returned to ED 9/15 and then left again [facility] 9/18. She again returned 9/18 with pleuritic chest pain. CXR showed stable cavitary lesions.    Assessment & Plan:   Principal Problem:   Acute bacterial MSSA endocarditis - Continue Nafcillin   Active Problems:   Opioid type dependence, continuous (HCC) / IVDA - No reports of withdrawals     Hepatitis C - Outpt follow up    Tobacco use - Counseled on cessation  - Continue nicotine patch    DVT prophylaxis: Lovenox subQ Code Status: full code  Family Communication: no family at the bedside  Disposition Plan: home once cleared by ID   Consultants:   ID  Procedures:   None   Antimicrobials:   Nafcillin -->    Subjective: No overnight events.   Objective: Vitals:   02/27/17 0645 02/27/17 1428 02/27/17 2054 02/28/17 0620  BP: 106/60 111/66 115/77 114/73  Pulse:  98  89  Resp:   18   Temp:  99.1 F (37.3 C) 98.6 F (37 C) 98 F (36.7 C)  TempSrc:  Oral Oral Axillary  SpO2: 99% 98% 99%   Weight:      Height:       No intake or output data in the 24 hours ending 02/28/17 0820 Filed Weights   02/25/17 1649 02/26/17 0516  Weight: 46.5 kg (102 lb 9.6 oz) 46.3 kg (102 lb)    Physical Exam  Constitutional: Appears well-developed and well-nourished. No distress.  CVS: RRR, S1/S2 +, no murmurs, no gallops, no carotid bruit.  Pulmonary: Effort and breath sounds normal, no stridor, rhonchi, wheezes, rales.    Abdominal: Soft. BS +,  no distension, tenderness, rebound or guarding.  Musculoskeletal: Normal range of motion. No edema and no tenderness.  Lymphadenopathy: No lymphadenopathy noted, cervical, inguinal. Neuro: Alert. Normal reflexes, muscle tone coordination. No cranial nerve deficit. Skin: Skin is warm and dry. No rash noted. Not diaphoretic. No erythema. No pallor.  Psychiatric: Normal mood and affect. Behavior, judgment, thought content normal.    Data Reviewed: I have personally reviewed following labs and imaging studies  CBC:  Recent Labs Lab 02/23/17 0933 02/24/17 1611 02/25/17 0020 02/27/17 0724  WBC 10.3 13.5* 10.2 7.2  NEUTROABS 6.6  --   --  4.0  HGB 10.0* 10.8* 9.6* 9.9*  HCT 30.4* 33.5* 30.4* 31.4*  MCV 84.0 84.2 84.0 85.3  PLT 453* 538* 485* 549*   Basic Metabolic Panel:  Recent Labs Lab 02/23/17 0933 02/24/17 1611 02/25/17 0020 02/25/17 0430 02/27/17 0724  NA 139 136  --  139 134*  K 4.1 4.0  --  3.7 3.9  CL 107 103  --  110 103  CO2 25 25  --  24 23  GLUCOSE 91 91  --  98 108*  BUN 11 7  --  9 19  CREATININE 0.45 0.47 0.47 0.44 0.56  CALCIUM 7.9* 8.9  --  7.6* 8.3*  GFR: Estimated Creatinine Clearance: 75.8 mL/min (by C-G formula based on SCr of 0.56 mg/dL). Liver Function Tests: No results for input(s): AST, ALT, ALKPHOS, BILITOT, PROT, ALBUMIN in the last 168 hours. No results for input(s): LIPASE, AMYLASE in the last 168 hours. No results for input(s): AMMONIA in the last 168 hours. Coagulation Profile: No results for input(s): INR, PROTIME in the last 168 hours. Cardiac Enzymes:  Recent Labs Lab 02/21/17 0948 02/25/17 0430 02/25/17 0852 02/25/17 1652  TROPONINI <0.03 <0.03 <0.03 <0.03   BNP (last 3 results) No results for input(s): PROBNP in the last 8760 hours. HbA1C: No results for input(s): HGBA1C in the last 72 hours. CBG: No results for input(s): GLUCAP in the last 168 hours. Lipid Profile: No results for input(s):  CHOL, HDL, LDLCALC, TRIG, CHOLHDL, LDLDIRECT in the last 72 hours. Thyroid Function Tests: No results for input(s): TSH, T4TOTAL, FREET4, T3FREE, THYROIDAB in the last 72 hours. Anemia Panel: No results for input(s): VITAMINB12, FOLATE, FERRITIN, TIBC, IRON, RETICCTPCT in the last 72 hours. Urine analysis:    Component Value Date/Time   COLORURINE YELLOW 02/20/2017 1953   APPEARANCEUR CLOUDY (A) 02/20/2017 1953   LABSPEC 1.017 02/20/2017 1953   PHURINE 6.0 02/20/2017 1953   GLUCOSEU NEGATIVE 02/20/2017 1953   HGBUR NEGATIVE 02/20/2017 1953   BILIRUBINUR NEGATIVE 02/20/2017 1953   KETONESUR NEGATIVE 02/20/2017 1953   PROTEINUR NEGATIVE 02/20/2017 1953   UROBILINOGEN 0.2 10/15/2012 1341   NITRITE NEGATIVE 02/20/2017 1953   LEUKOCYTESUR SMALL (A) 02/20/2017 1953   Sepsis Labs: @LABRCNTIP (procalcitonin:4,lacticidven:4)   ) Recent Results (from the past 240 hour(s))  Culture, blood (routine x 2)     Status: None   Collection Time: 02/20/17  4:54 PM  Result Value Ref Range Status   Specimen Description BLOOD LEFT WRIST  Final   Special Requests   Final    BOTTLES DRAWN AEROBIC AND ANAEROBIC Blood Culture adequate volume   Culture   Final    NO GROWTH 5 DAYS Performed at [facility], [place] [address], [place], [place] [address]    Report Status 02/26/2017 FINAL  Final  Culture, blood (routine x 2)     Status: None   Collection Time: 02/20/17  5:20 PM  Result Value Ref Range Status   Specimen Description BLOOD RIGHT ARM  Final   Special Requests Blood Culture adequate volume  Final   Culture   Final    NO GROWTH 5 DAYS Performed at [facility], [place] [address], [place], [place] [address]    Report Status 02/26/2017 FINAL  Final  Culture, Urine     Status: None   Collection Time: 02/20/17  7:54 PM  Result Value Ref Range Status   Specimen Description URINE, RANDOM  Final   Special Requests NONE  Final   Culture   Final    NO GROWTH Performed at [facility], [place] [address], [place], [place] [address]    Report Status 02/22/2017 FINAL  Final  Blood culture (routine x 2)     Status: None (Preliminary result)   Collection Time: 02/24/17 11:50 PM  Result Value Ref Range Status   Specimen Description BLOOD RIGHT HAND  Final   Special Requests   Final    BOTTLES DRAWN AEROBIC AND ANAEROBIC Blood Culture adequate volume   Culture NO GROWTH 2 DAYS  Final   Report Status PENDING  Incomplete  Blood culture (routine x 2)     Status: None (Preliminary result)  Collection Time: 02/25/17 12:20 AM  Result Value Ref Range Status   Specimen Description BLOOD LEFT HAND  Final   Special Requests   Final    BOTTLES DRAWN AEROBIC AND ANAEROBIC Blood Culture adequate volume   Culture NO GROWTH 2 DAYS  Final   Report Status PENDING  Incomplete      Radiology Studies: Dg Chest 2 View  Result Date: 02/24/2017 CLINICAL DATA:  Chest pain and history of endocarditis EXAM: CHEST  2 VIEW COMPARISON:  02/20/2017 FINDINGS: Cardiac shadow is stable. The lungs are well aerated bilaterally. Multiple small cavitary lesions are noted within the right lung similar to that noted on the prior exam. No new focal abnormality is noted. No effusion is seen. No bony abnormality is noted. IMPRESSION: Stable cavitary lesions within the right lung. No new focal abnormality is seen. Electronically Signed   By: Inez Catalina M.D.   On: 02/24/2017 16:34      Scheduled Meds: . enoxaparin (LOVENOX) injection  40 mg Subcutaneous Daily  . famotidine  40 mg Oral BID  . folic acid  1 mg Oral Daily  . ketorolac  15 mg Intravenous Q6H  . LORazepam  0-4 mg Intravenous Q12H  . multivitamin with minerals  1 tablet Oral Daily  . nicotine  21 mg Transdermal Daily  . thiamine  100 mg Oral Daily   Continuous Infusions: . nafcillin IV Stopped (02/28/17 0655)     LOS: 3 days    Time spent: 15 minutes  Greater than 50% of the time spent on counseling and coordinating the  care.   Leisa Lenz, MD [facility] Pager [phone]  If 7PM-7AM, please contact night-coverage [email or web address] Password [email or web address] 02/28/2017, 8:20 AM

## 2017-03-02 LAB — CULTURE, BLOOD (ROUTINE X 2)
CULTURE: NO GROWTH
Culture: NO GROWTH
SPECIAL REQUESTS: ADEQUATE
Special Requests: ADEQUATE

## 2017-03-06 NOTE — Discharge Summary (Signed)
Physician Discharge Summary  Sherri Francis QAS:[id number] DOB: [birth year] DOA: 02/24/2017  PCP: Patient, No Pcp Per  Admit date: 02/24/2017 Discharge date: 03/06/2017  Recommendations for Outpatient Follow-up:  1. Pt left AMA  Discharge Diagnoses:  Principal Problem:   Acute bacterial endocarditis Active Problems:   Opioid type dependence, continuous (HCC)   Anemia   Endocarditis    Discharge Condition: left AMA  Diet recommendation: as tolerated   History of present illness:  31 year old female with chronic hepatitis C without hepatic coma, history of IVDA, depression, anxiety, admitted in august for MSSA +/- strep viridans TV endocarditis with vegetation of 1.5 x 1.2 cm with pulmonary septic emboli and cavitary lesions. She was supposed to finish abx till 9/13 but left AMA on 9/8. She returned to ED 9/15 and then left again [facility] 9/18. She again returned 9/18 with pleuritic chest pain. CXR showed stable cavitary lesions.    Assessment & Plan:   Principal Problem:   Acute bacterial MSSA endocarditis - Pt left AMA - Was on Nafcillin   Active Problems:   Opioid type dependence, continuous (HCC) / IVDA - No withdrawals     Hepatitis C - Outpt follow up    Tobacco use - Counseled on cessation  - Was on nicotine patch    DVT prophylaxis: Lovenox subQ Code Status: full code  Family Communication: no family at the bedside     Consultants:   ID  Procedures:   None   Antimicrobials:   Nafcillin -->    Signed:  Leisa Lenz, MD  [facility] 03/06/2017, 3:56 PM  Pager #: [phone]     Discharge Exam: Vitals:   02/28/17 0620 02/28/17 1442  BP: 114/73 113/68  Pulse: 89 97  Resp:  17  Temp: 98 F (36.7 C) 97.9 F (36.6 C)  SpO2:  100%   Vitals:   02/27/17 1428 02/27/17 2054 02/28/17 0620 02/28/17 1442  BP: 111/66 115/77 114/73 113/68  Pulse: 98  89 97  Resp:  18  17  Temp: 99.1 F (37.3 C) 98.6 F (37 C) 98 F (36.7 C)  97.9 F (36.6 C)  TempSrc: Oral Oral Axillary Oral  SpO2: 98% 99%  100%  Weight:      Height:        PE not done before pt left as pt left AMA  Discharge Instructions   Allergies as of 02/28/2017   No Known Allergies     Medication List    ASK your doctor about these medications   cephALEXin 500 MG capsule Commonly known as:  KEFLEX Take 2 capsules (1,000 mg total) by mouth 2 (two) times daily. Ask about: Should I take this medication?         The results of significant diagnostics from this hospitalization (including imaging, microbiology, ancillary and laboratory) are listed below for reference.    Significant Diagnostic Studies: Dg Chest 2 View  Result Date: 02/24/2017 CLINICAL DATA:  Chest pain and history of endocarditis EXAM: CHEST  2 VIEW COMPARISON:  02/20/2017 FINDINGS: Cardiac shadow is stable. The lungs are well aerated bilaterally. Multiple small cavitary lesions are noted within the right lung similar to that noted on the prior exam. No new focal abnormality is noted. No effusion is seen. No bony abnormality is noted. IMPRESSION: Stable cavitary lesions within the right lung. No new focal abnormality is seen. Electronically Signed   By: Inez Catalina M.D.   On: 02/24/2017 16:34   Dg Chest 2 View  Result Date: 02/20/2017 CLINICAL DATA:  Known endocarditis. EXAM: CHEST  2 VIEW COMPARISON:  January 24, 2017 chest x-ray and CT scan February 13, 2017 FINDINGS: At least 3 nodules remain on the right, 2 which are cavitary. No definitive nodules are seen on the left. Overall, there has been interval improvement. No pneumothorax. The cardiomediastinal silhouette is stable. IMPRESSION: Continued nodules in the right lung, 2 of which are cavitary. No other definitive nodules identified today. The findings are consistent with known septic emboli. Overall, there has been interval improvement. Electronically Signed   By: Dorise Bullion III M.D   On: 02/20/2017 17:08   Ct Head Wo  Contrast  Result Date: 02/20/2017 CLINICAL DATA:  Acute onset of headache.  History of heroin abuse. EXAM: CT HEAD WITHOUT CONTRAST TECHNIQUE: Contiguous axial images were obtained from the base of the skull through the vertex without intravenous contrast. COMPARISON:  None. FINDINGS: Brain: No evidence of acute infarction, hemorrhage, hydrocephalus, extra-axial collection or mass lesion/mass effect. Vascular: No hyperdense vessel or unexpected calcification. Skull: Normal. Negative for fracture or focal lesion. Sinuses/Orbits: No acute finding. Other: None. IMPRESSION: 1. No acute intracranial abnormalities.  Normal brain. Electronically Signed   By: Kerby Moors M.D.   On: 02/20/2017 20:05   Ct Angio Chest Pe W Or Wo Contrast  Result Date: 02/13/2017 CLINICAL DATA:  Known septic pulmonary emboli. Known tricuspid valve vegetation. History of IV drug abuse. Fever and cough. EXAM: CT ANGIOGRAPHY CHEST WITH CONTRAST TECHNIQUE: Multidetector CT imaging of the chest was performed using the standard protocol during bolus administration of intravenous contrast. Multiplanar CT image reconstructions and MIPs were obtained to evaluate the vascular anatomy. CONTRAST:  100 mL of Isovue 370 COMPARISON:  Chest x-ray January 24, 2017.  Chest CT January 27, 2017. FINDINGS: Cardiovascular: The heart size is normal. The reported tricuspid valve vegetation is not seen on CT imaging. No coronary artery calcifications are noted. The thoracic aorta is normal in caliber with no dissection. No pulmonary emboli. Mediastinum/Nodes: The prominent right hilar nodes seen previously measures 10 mm, unchanged. No new adenopathy. The esophagus and thyroid are normal. No effusions. A mildly prominent node containing calcifications in the right axilla stable, likely from previous granulomatous infection. Lungs/Pleura: The central airways are within normal limits. No pneumothorax. Numerous cavitary nodules are again identified consistent with  history of septic emboli. However, there has been improvement and the numerous nodules are smaller in the interval and the peripheral walls of these nodules are thinner. A few of the nodules have resolved. No new nodules identified. No new infiltrate. Upper Abdomen: No acute abnormality. Musculoskeletal: No chest wall abnormality. No acute or significant osseous findings. Review of the MIP images confirms the above findings. IMPRESSION: 1. Persistent but improving cavitary nodules in the lungs consistent with septic emboli. 2. No pulmonary embolus identified. 3. No other acute abnormalities.  Reactive node in the right hilum. Electronically Signed   By: Dorise Bullion III M.D   On: 02/13/2017 08:57   US Abdomen Complete  Result Date: 02/13/2017 CLINICAL DATA:  Pain EXAM: ABDOMEN ULTRASOUND COMPLETE COMPARISON:  None. FINDINGS: Gallbladder: No gallstones or wall thickening visualized. No sonographic Murphy sign noted by sonographer. Common bile duct: Diameter: 3 mm Liver: No focal lesion identified. Within normal limits in parenchymal echogenicity. Portal vein is patent on color Doppler imaging with normal direction of blood flow towards the liver. IVC: No abnormality visualized. Pancreas: Visualized portion unremarkable. Spleen: Size and appearance within normal limits. Right Kidney: Length:  11.3 cm.  12 mm cyst. Left Kidney: Length: 11 cm. Echogenicity within normal limits. No mass or hydronephrosis visualized. Abdominal aorta: No aneurysm visualized. Other findings: None. IMPRESSION: 1. No acute abnormality. Electronically Signed   By: Dorise Bullion III M.D   On: 02/13/2017 11:16   Ct Abdomen Pelvis W Contrast  Result Date: 02/07/2017 CLINICAL DATA:  Acute nausea and non bilious vomiting. History of IV drug abuse. Inpatient. EXAM: CT ABDOMEN AND PELVIS WITH CONTRAST TECHNIQUE: Multidetector CT imaging of the abdomen and pelvis was performed using the standard protocol following bolus administration of  intravenous contrast. CONTRAST:  < 75 cc > ISOVUE-300 IOPAMIDOL (ISOVUE-300) INJECTION 61% COMPARISON:  01/31/2017 right upper quadrant abdominal sonogram. 01/13/2016 CT abdomen/pelvis. FINDINGS: Lower chest: Small dependent right pleural effusion, increased. Numerous irregular thick walled cavitary nodules throughout both lung bases, which generally demonstrate increased cavitation and decreased wall thickening since 01/27/2017 chest CT. For example, a 2.2 cm peripheral left lower lobe cavitary nodule (series 6/ image 13) is a mildly decreased from 2.2 cm and demonstrates decreased wall thickness. A 3.0 cm peripheral right lower lobe cavitary nodule (series 6/ image 3) is decreased from 3.7 cm and demonstrates decreased wall thickness and increased cavitation. A 3.8 cm cavitary nodule in the basilar right lower lobe (series 6/image 14) is mildly increased from 3.5 cm, although demonstrates increased cavitation and decreased wall thickness. Hepatobiliary: Normal liver with no liver mass. Normal gallbladder with no radiopaque cholelithiasis. No biliary ductal dilatation. Common bile duct diameter 5 mm. Pancreas: Normal, with no mass or duct dilation. Spleen: Normal size. No mass. Adrenals/Urinary Tract: Normal adrenals. Hypodense 1.0 cm lateral upper right renal cortical lesion with density 36 HU (series 2/ image 22), stable in size since 01/13/2016, suggesting a benign renal cyst. Additional tiny subcentimeter hypodense lower right and upper left renal cortical lesions are too small to characterize and also stable since 01/13/2016, suggesting benign renal cysts. No new renal lesions. No hydronephrosis. Normal bladder. Stomach/Bowel: Grossly normal stomach. Normal caliber small bowel with no small bowel wall thickening. Normal appendix. Normal large bowel with no diverticulosis, large bowel wall thickening or pericolonic fat stranding. Oral contrast progresses to the distal transverse colon. Vascular/Lymphatic:  Normal caliber abdominal aorta. Patent portal, splenic, hepatic and renal veins. No pathologically enlarged lymph nodes in the abdomen or pelvis. Reproductive: Grossly normal uterus. 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 No right adnexal mass. Generalized haziness of the pelvic fat. Other: No pneumoperitoneum, ascites or focal fluid collection. Musculoskeletal: No aggressive appearing focal osseous lesions. IMPRESSION: 1. Numerous cavitary septic emboli at the lung bases, which generally demonstrate decreased wall thickness and increased cavitation since 01/27/2017 chest CT, suggesting treatment response. Small dependent right pleural effusion is mildly increased. Continued post treatment chest imaging follow-up recommended. 2. No evidence of bowel obstruction or acute bowel inflammation. Normal appendix . 3. 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 Nonspecific generalized haziness of the pelvic fat, which could be a sign of pelvic inflammatory disease versus potentially an inflammatory response to leaking left ovarian teratoma. No pelvic abscess. GYN consultation advised for consideration of resection of the teratoma given the size and growth. Electronically Signed   By: Ilona Sorrel M.D.   On: 02/07/2017 17:22    Microbiology: Recent Results (from the past 240 hour(s))  Blood culture (routine x 2)  Status: None   Collection Time: 02/24/17 11:50 PM  Result Value Ref Range Status   Specimen Description BLOOD RIGHT HAND  Final   Special Requests   Final    BOTTLES DRAWN AEROBIC AND ANAEROBIC Blood Culture adequate volume   Culture NO GROWTH 5 DAYS  Final   Report Status 03/02/2017 FINAL  Final  Blood culture (routine x 2)     Status: None   Collection Time: 02/25/17 12:20 AM  Result Value Ref Range Status   Specimen Description BLOOD LEFT HAND  Final    Special Requests   Final    BOTTLES DRAWN AEROBIC AND ANAEROBIC Blood Culture adequate volume   Culture NO GROWTH 5 DAYS  Final   Report Status 03/02/2017 FINAL  Final     Labs: Basic Metabolic Panel: No results for input(s): NA, K, CL, CO2, GLUCOSE, BUN, CREATININE, CALCIUM, MG, PHOS in the last 168 hours. Liver Function Tests: No results for input(s): AST, ALT, ALKPHOS, BILITOT, PROT, ALBUMIN in the last 168 hours. No results for input(s): LIPASE, AMYLASE in the last 168 hours. No results for input(s): AMMONIA in the last 168 hours. CBC: No results for input(s): WBC, NEUTROABS, HGB, HCT, MCV, PLT in the last 168 hours. Cardiac Enzymes: No results for input(s): CKTOTAL, CKMB, CKMBINDEX, TROPONINI in the last 168 hours. BNP: BNP (last 3 results) No results for input(s): BNP in the last 8760 hours.  ProBNP (last 3 results) No results for input(s): PROBNP in the last 8760 hours.  CBG: No results for input(s): GLUCAP in the last 168 hours.

## 2017-09-06 DIAGNOSIS — M4622 Osteomyelitis of vertebra, cervical region: Secondary | ICD-10-CM

## 2017-09-06 HISTORY — DX: Osteomyelitis of vertebra, cervical region: M46.22

## 2017-09-20 ENCOUNTER — Other Ambulatory Visit (HOSPITAL_COMMUNITY): Payer: Self-pay

## 2017-09-20 ENCOUNTER — Inpatient Hospital Stay (HOSPITAL_COMMUNITY)
Admission: EM | Admit: 2017-09-20 | Discharge: 2017-09-21 | DRG: 094 | Discharge status: Against Medical Advice | Payer: Self-pay | Attending: Family Medicine | Admitting: Family Medicine

## 2017-09-20 ENCOUNTER — Other Ambulatory Visit: Payer: Self-pay

## 2017-09-20 ENCOUNTER — Emergency Department (HOSPITAL_COMMUNITY): Payer: Self-pay

## 2017-09-20 ENCOUNTER — Encounter (HOSPITAL_COMMUNITY): Payer: Self-pay | Admitting: Emergency Medicine

## 2017-09-20 DIAGNOSIS — Z8619 Personal history of other infectious and parasitic diseases: Secondary | ICD-10-CM

## 2017-09-20 DIAGNOSIS — F17210 Nicotine dependence, cigarettes, uncomplicated: Secondary | ICD-10-CM | POA: Diagnosis present

## 2017-09-20 DIAGNOSIS — F1120 Opioid dependence, uncomplicated: Secondary | ICD-10-CM | POA: Diagnosis present

## 2017-09-20 DIAGNOSIS — Z811 Family history of alcohol abuse and dependence: Secondary | ICD-10-CM

## 2017-09-20 DIAGNOSIS — Z818 Family history of other mental and behavioral disorders: Secondary | ICD-10-CM

## 2017-09-20 DIAGNOSIS — Z5321 Procedure and treatment not carried out due to patient leaving prior to being seen by health care provider: Secondary | ICD-10-CM | POA: Diagnosis present

## 2017-09-20 DIAGNOSIS — B9561 Methicillin susceptible Staphylococcus aureus infection as the cause of diseases classified elsewhere: Secondary | ICD-10-CM | POA: Diagnosis present

## 2017-09-20 DIAGNOSIS — F1123 Opioid dependence with withdrawal: Secondary | ICD-10-CM | POA: Diagnosis present

## 2017-09-20 DIAGNOSIS — Z8249 Family history of ischemic heart disease and other diseases of the circulatory system: Secondary | ICD-10-CM

## 2017-09-20 DIAGNOSIS — Z915 Personal history of self-harm: Secondary | ICD-10-CM

## 2017-09-20 DIAGNOSIS — I38 Endocarditis, valve unspecified: Secondary | ICD-10-CM | POA: Diagnosis present

## 2017-09-20 DIAGNOSIS — Z813 Family history of other psychoactive substance abuse and dependence: Secondary | ICD-10-CM

## 2017-09-20 DIAGNOSIS — G061 Intraspinal abscess and granuloma: Principal | ICD-10-CM | POA: Diagnosis present

## 2017-09-20 DIAGNOSIS — M4622 Osteomyelitis of vertebra, cervical region: Secondary | ICD-10-CM | POA: Diagnosis present

## 2017-09-20 DIAGNOSIS — M542 Cervicalgia: Secondary | ICD-10-CM

## 2017-09-20 DIAGNOSIS — R7881 Bacteremia: Secondary | ICD-10-CM | POA: Diagnosis present

## 2017-09-20 DIAGNOSIS — I330 Acute and subacute infective endocarditis: Secondary | ICD-10-CM | POA: Diagnosis present

## 2017-09-20 LAB — COMPREHENSIVE METABOLIC PANEL
ALBUMIN: 3.2 g/dL — AB (ref 3.5–5.0)
ALK PHOS: 97 U/L (ref 38–126)
ALT: 24 U/L (ref 14–54)
AST: 19 U/L (ref 15–41)
Anion gap: 11 (ref 5–15)
BILIRUBIN TOTAL: 0.1 mg/dL — AB (ref 0.3–1.2)
BUN: 7 mg/dL (ref 6–20)
CALCIUM: 8.6 mg/dL — AB (ref 8.9–10.3)
CO2: 21 mmol/L — AB (ref 22–32)
CREATININE: 0.44 mg/dL (ref 0.44–1.00)
Chloride: 104 mmol/L (ref 101–111)
GFR calc Af Amer: >60 mL/min (ref >60)
GFR calc non Af Amer: >60 mL/min (ref >60)
GLUCOSE: 115 mg/dL — AB (ref 65–99)
Potassium: 3.9 mmol/L (ref 3.5–5.1)
SODIUM: 136 mmol/L (ref 135–145)
TOTAL PROTEIN: 7.8 g/dL (ref 6.5–8.1)

## 2017-09-20 LAB — URINALYSIS, ROUTINE W REFLEX MICROSCOPIC
Bilirubin Urine: NEGATIVE
GLUCOSE, UA: NEGATIVE mg/dL
HGB URINE DIPSTICK: NEGATIVE
Ketones, ur: NEGATIVE mg/dL
NITRITE: NEGATIVE
Protein, ur: NEGATIVE mg/dL
Specific Gravity, Urine: 1.006 (ref 1.005–1.030)
pH: 8 (ref 5.0–8.0)

## 2017-09-20 LAB — CBC WITH DIFFERENTIAL/PLATELET
BASOS PCT: 0 %
Basophils Absolute: 0 10*3/uL (ref 0.0–0.1)
EOS ABS: 0 10*3/uL (ref 0.0–0.7)
EOS PCT: 0 %
HCT: 31.9 % — ABNORMAL LOW (ref 36.0–46.0)
Hemoglobin: 10.1 g/dL — ABNORMAL LOW (ref 12.0–15.0)
Lymphocytes Relative: 21 %
Lymphs Abs: 2.7 10*3/uL (ref 0.7–4.0)
MCH: 24.3 pg — ABNORMAL LOW (ref 26.0–34.0)
MCHC: 31.7 g/dL (ref 30.0–36.0)
MCV: 76.9 fL — ABNORMAL LOW (ref 78.0–100.0)
MONO ABS: 0.7 10*3/uL (ref 0.1–1.0)
MONOS PCT: 5 %
Neutro Abs: 9.7 10*3/uL — ABNORMAL HIGH (ref 1.7–7.7)
Neutrophils Relative %: 74 %
PLATELETS: 474 10*3/uL — AB (ref 150–400)
RBC: 4.15 MIL/uL (ref 3.87–5.11)
RDW: 15.6 % — AB (ref 11.5–15.5)
WBC: 13.2 10*3/uL — ABNORMAL HIGH (ref 4.0–10.5)

## 2017-09-20 LAB — I-STAT BETA HCG BLOOD, ED (MC, WL, AP ONLY)

## 2017-09-20 LAB — SEDIMENTATION RATE: Sed Rate: 48 mm/hr — ABNORMAL HIGH (ref 0–22)

## 2017-09-20 MED ORDER — MORPHINE SULFATE (PF) 4 MG/ML IV SOLN
4.0000 mg | Freq: Once | INTRAVENOUS | Status: AC
  Administered 2017-09-20: 4 mg via INTRAVENOUS
  Filled 2017-09-20: qty 1

## 2017-09-20 MED ORDER — SODIUM CHLORIDE 0.9 % IV BOLUS
1000.0000 mL | Freq: Once | INTRAVENOUS | Status: AC
  Administered 2017-09-20: 1000 mL via INTRAVENOUS

## 2017-09-20 MED ORDER — LORAZEPAM 2 MG/ML IJ SOLN
1.0000 mg | Freq: Four times a day (QID) | INTRAMUSCULAR | Status: DC | PRN
  Administered 2017-09-21: 1 mg via INTRAVENOUS
  Filled 2017-09-20: qty 1

## 2017-09-20 MED ORDER — SODIUM CHLORIDE 0.9 % IV BOLUS
1000.0000 mL | Freq: Once | INTRAVENOUS | Status: AC
Start: 2017-09-20 — End: 2017-09-20
  Administered 2017-09-20: 1000 mL via INTRAVENOUS

## 2017-09-20 MED ORDER — FOLIC ACID 1 MG PO TABS
1.0000 mg | ORAL_TABLET | Freq: Every day | ORAL | Status: DC
  Administered 2017-09-21: 1 mg via ORAL
  Filled 2017-09-20: qty 1

## 2017-09-20 MED ORDER — ACETAMINOPHEN 650 MG RE SUPP: 650.0000 mg | Freq: Four times a day (QID) | RECTAL | Status: DC | PRN

## 2017-09-20 MED ORDER — ADULT MULTIVITAMIN W/MINERALS CH
1.0000 | ORAL_TABLET | Freq: Every day | ORAL | Status: DC
  Administered 2017-09-21: 1 via ORAL
  Filled 2017-09-20: qty 1

## 2017-09-20 MED ORDER — THIAMINE HCL 100 MG/ML IJ SOLN: 100.0000 mg | Freq: Every day | INTRAMUSCULAR | Status: DC

## 2017-09-20 MED ORDER — LORAZEPAM 1 MG PO TABS
1.0000 mg | ORAL_TABLET | Freq: Four times a day (QID) | ORAL | Status: DC | PRN
  Administered 2017-09-21: 1 mg via ORAL
  Filled 2017-09-20: qty 1

## 2017-09-20 MED ORDER — MORPHINE SULFATE (PF) 4 MG/ML IV SOLN
2.0000 mg | Freq: Once | INTRAVENOUS | Status: AC
  Administered 2017-09-20: 2 mg via INTRAVENOUS
  Filled 2017-09-20: qty 1

## 2017-09-20 MED ORDER — VITAMIN B-1 100 MG PO TABS
100.0000 mg | ORAL_TABLET | Freq: Every day | ORAL | Status: DC
  Administered 2017-09-21: 100 mg via ORAL
  Filled 2017-09-20: qty 1

## 2017-09-20 MED ORDER — PIPERACILLIN-TAZOBACTAM 3.375 G IVPB 30 MIN
3.3750 g | Freq: Once | INTRAVENOUS | Status: AC
  Administered 2017-09-20: 3.375 g via INTRAVENOUS
  Filled 2017-09-20: qty 50

## 2017-09-20 MED ORDER — LORAZEPAM 2 MG/ML IJ SOLN
1.0000 mg | Freq: Once | INTRAMUSCULAR | Status: AC
  Administered 2017-09-20: 1 mg via INTRAVENOUS
  Filled 2017-09-20: qty 1

## 2017-09-20 MED ORDER — PIPERACILLIN-TAZOBACTAM 3.375 G IVPB
3.3750 g | Freq: Three times a day (TID) | INTRAVENOUS | Status: DC
Start: 2017-09-21 — End: 2017-09-21

## 2017-09-20 MED ORDER — VANCOMYCIN HCL IN DEXTROSE 1-5 GM/200ML-% IV SOLN
1000.0000 mg | Freq: Once | INTRAVENOUS | Status: AC
  Administered 2017-09-20: 1000 mg via INTRAVENOUS
  Filled 2017-09-20: qty 200

## 2017-09-20 MED ORDER — ONDANSETRON HCL 4 MG/2ML IJ SOLN: 4.0000 mg | Freq: Four times a day (QID) | INTRAMUSCULAR | Status: DC | PRN

## 2017-09-20 MED ORDER — HYDROCODONE-ACETAMINOPHEN 10-325 MG PO TABS
1.0000 | ORAL_TABLET | Freq: Four times a day (QID) | ORAL | Status: DC | PRN
  Administered 2017-09-21: 2 via ORAL
  Filled 2017-09-20 (×2): qty 2

## 2017-09-20 MED ORDER — ENOXAPARIN SODIUM 40 MG/0.4ML ~~LOC~~ SOLN
40.0000 mg | Freq: Every day | SUBCUTANEOUS | Status: DC
  Filled 2017-09-20: qty 0.4

## 2017-09-20 MED ORDER — LORAZEPAM 2 MG/ML IJ SOLN
0.5000 mg | Freq: Once | INTRAMUSCULAR | Status: AC
  Administered 2017-09-20: 0.5 mg via INTRAVENOUS
  Filled 2017-09-20: qty 1

## 2017-09-20 MED ORDER — ACETAMINOPHEN 325 MG PO TABS: 650.0000 mg | ORAL_TABLET | Freq: Four times a day (QID) | ORAL | Status: DC | PRN

## 2017-09-20 MED ORDER — ONDANSETRON HCL 4 MG PO TABS: 4.0000 mg | ORAL_TABLET | Freq: Four times a day (QID) | ORAL | Status: DC | PRN

## 2017-09-20 MED ORDER — VANCOMYCIN HCL 500 MG IV SOLR
500.0000 mg | Freq: Two times a day (BID) | INTRAVENOUS | Status: DC
  Filled 2017-09-20: qty 500

## 2017-09-20 NOTE — ED Provider Notes (Signed)
[place] EMERGENCY DEPARTMENT Provider Note   CSN: [id number] Arrival date & time: 09/20/17  5631     History   Chief Complaint Chief Complaint  Patient presents with  . Neck Pain  . Fever    HPI Sherri Francis is a 32 y.o. female with a past medical history of hepatitis C, endocarditis, IV drug use, who presents to ED for evaluation of 2-day history of neck stiffness, subjective fever.  She also reports intermittent headaches, chest pain.  Symptoms began suddenly 2 days ago when she cannot recall any inciting event that may have triggered the pain.  She has not taking any medications prior to arrival to help with pain.  She does admit to heroin use with her last use being yesterday.  She does not take any medications currently for endocarditis.  Denies any injuries or falls, sick contacts with similar symptoms, vomiting, shortness of breath, hemoptysis.  HPI  Past Medical History:  Diagnosis Date  . Anemia   . Anxiety   . Depression   . Endocarditis   . Hepatitis C   . IVDU (intravenous drug user)   . PID (acute pelvic inflammatory disease)   . Suicide attempt [facility])    multiple attempts  . Teratoma   . UTI (urinary tract infection)     Patient Active Problem List   Diagnosis Date Noted  . Endocarditis 02/25/2017  . Opioid abuse with opioid-induced mood disorder ([place]) 02/21/2017  . Fever 02/20/2017  . Anemia 02/20/2017  . Acute bacterial endocarditis   . Substance induced mood disorder ([place]) 01/30/2017  . Opioid type dependence, continuous ([place]) 01/30/2017  . 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) 01/28/2017  . Bacteremia due to Staphylococcus aureus 01/27/2017  . Pneumonia 01/24/2017  . Pelvic abscess in female 01/13/2016  . PID (acute pelvic inflammatory disease) 01/13/2016    Past Surgical History:  Procedure Laterality Date  . LAPAROSCOPY       OB History    Gravida  0     Para  0   Term  0   Preterm  0   AB  0   Living  0     SAB  0   TAB  0   Ectopic  0   Multiple  0   Live Births  0            Home Medications    Prior to Admission medications   Not on File    Family History Family History  Problem Relation Age of Onset  . Heart disease Maternal Grandfather   . Anxiety disorder Maternal Grandfather   . Heart disease Maternal Aunt   . Alcohol abuse Father   . Drug abuse Father   . Cancer Neg Hx     Social History Social History   Tobacco Use  . Smoking status: Current Every Day Smoker    Packs/day: 0.50    Types: Cigarettes  . Smokeless tobacco: Never Used  Substance Use Topics  . Alcohol use: Yes    Comment: 6 beers perday- 2 years ago was last time hx of alcoholism per pt.  . Drug use: Yes    Types: IV    Comment: heroin, last 2 days prior to admission     Allergies   Patient has no known allergies.   Review of Systems Review of Systems  Constitutional: Positive for fever. Negative for appetite change and chills.  HENT:  Negative for ear pain, rhinorrhea, sneezing and sore throat.   Eyes: Negative for photophobia and visual disturbance.  Respiratory: Negative for cough, chest tightness, shortness of breath and wheezing.   Cardiovascular: Positive for chest pain. Negative for palpitations.  Gastrointestinal: Negative for abdominal pain, blood in stool, constipation, diarrhea, nausea and vomiting.  Genitourinary: Negative for dysuria, hematuria and urgency.  Musculoskeletal: Positive for neck pain and neck stiffness. Negative for myalgias.  Skin: Negative for rash.  Neurological: Negative for dizziness, weakness and light-headedness.     Physical Exam Updated Vital Signs BP (!) 150/78   Pulse 96   Temp 99.5 F (37.5 C) (Oral)   Resp (!) 21   Wt 46.3 kg (102 lb)   LMP 03/08/2017   SpO2 99%   BMI 19.27 kg/m   Physical Exam  Constitutional: She appears well-developed and well-nourished. No  distress.  Tearful.  HENT:  Head: Normocephalic and atraumatic.  Nose: Nose normal.  Eyes: Conjunctivae and EOM are normal. Left eye exhibits no discharge. No scleral icterus.  Neck: Normal range of motion. Neck supple.    Unable to perform range of motion of the neck secondary to pain.  Cardiovascular: Normal rate, regular rhythm, normal heart sounds and intact distal pulses. Exam reveals no gallop and no friction rub.  No murmur heard. Pulmonary/Chest: Effort normal and breath sounds normal. No respiratory distress.  Abdominal: Soft. Bowel sounds are normal. She exhibits no distension. There is no tenderness. There is no guarding.  Musculoskeletal: Normal range of motion. She exhibits no edema.  Neurological: She is alert. She exhibits normal muscle tone. Coordination normal.  Skin: Skin is warm and dry. No rash noted.  Psychiatric: She has a normal mood and affect.  Nursing note and vitals reviewed.    ED Treatments / Results  Labs (all labs ordered are listed, but only abnormal results are displayed) Labs Reviewed  COMPREHENSIVE METABOLIC PANEL - Abnormal; Notable for the following components:      Result Value   CO2 21 (*)    Glucose, Bld 115 (*)    Calcium 8.6 (*)    Albumin 3.2 (*)    Total Bilirubin 0.1 (*)    All other components within normal limits  CBC WITH DIFFERENTIAL/PLATELET - Abnormal; Notable for the following components:   WBC 13.2 (*)    Hemoglobin 10.1 (*)    HCT 31.9 (*)    MCV 76.9 (*)    MCH 24.3 (*)    RDW 15.6 (*)    Platelets 474 (*)    Neutro Abs 9.7 (*)    All other components within normal limits  SEDIMENTATION RATE - Abnormal; Notable for the following components:   Sed Rate 48 (*)    All other components within normal limits  CULTURE, BLOOD (ROUTINE X 2)  CULTURE, BLOOD (ROUTINE X 2)  C-REACTIVE PROTEIN  I-STAT BETA HCG BLOOD, ED ([facility], [facility], [facility] ONLY)    EKG None  Radiology Dg Chest 2 View  Result Date: 09/20/2017 CLINICAL DATA:   Fever and neck stiffness for 2 days. EXAM: CHEST - 2 VIEW COMPARISON:  PA and lateral chest 02/24/2017 and 01/24/2017. CT chest 01/27/2017. FINDINGS: The lungs are clear. Cavitary lesions in the right chest seen on prior examinations are not visualized today. No pneumothorax or pleural fluid. Heart size is normal. No bony abnormality. IMPRESSION: Negative chest. Electronically Signed   By: Inge Rise M.D.   On: 09/20/2017 09:23    Procedures Procedures (including critical care time)  Medications Ordered in ED Medications  sodium chloride 0.9 % bolus 1,000 mL (0 mLs Intravenous Stopped 09/20/17 0916)  morphine 4 MG/ML injection 2 mg (2 mg Intravenous Given 09/20/17 0815)  sodium chloride 0.9 % bolus 1,000 mL (0 mLs Intravenous Stopped 09/20/17 1204)  morphine 4 MG/ML injection 4 mg (4 mg Intravenous Given 09/20/17 1027)     Initial Impression / Assessment and Plan / ED Course  I have reviewed the triage vital signs and the nursing notes.  Pertinent labs & imaging results that were available during my care of the patient were reviewed by me and considered in my medical decision making (see chart for details).     Patient presents to ED for evaluation of 2-day history of neck stiffness, subjective fever.  She also reports intermittent headache and chest pain.  Patient does have a history of IV drug use with last use of IV heroin yesterday.  She also has a history of endocarditis for which she is noncompliant with any of her medication.  Denies any injuries or falls.  Patient refused to perform range of motion of neck secondary to pain.  She does have tenderness to palpation of the C-spine.  She is afebrile here.  Lab work significant for leukocytosis at 13, sed rate of 48.  Blood cultures obtained.  Patient will need an MRI of the cervical and thoracic spine to rule out epidural abscess.  Anticipate disposition home with outpatient follow-up if negative.  Care handed off to oncoming provider,  J.Ward, PA-C.  Portions of this note were generated with [employer]. Dictation errors may occur despite best attempts at proofreading.   Final Clinical Impressions(s) / ED Diagnoses   Final diagnoses:  None    ED Discharge Orders    None       Delia Heady, PA-C 09/20/17 1532    [place], Fredia Sorrow, MD 09/25/17 [phone]

## 2017-09-20 NOTE — ED Notes (Signed)
Pt states, "if you all don't give me something stronger for pain, I'm going to leave! My neck hurts and I can't even move it!". Notified PA of statements, will continue to monitor and await labs to result.

## 2017-09-20 NOTE — ED Notes (Signed)
Patient transported to X-ray 

## 2017-09-20 NOTE — ED Notes (Signed)
Hospitalist at bedside. Ward

## 2017-09-20 NOTE — Progress Notes (Signed)
4/15 @ 1932. Pt was able to tolerate half of entire exam. Pt then refused further scanning. Unable to finish exam as ordered.

## 2017-09-20 NOTE — H&P (Signed)
History and Physical    Sherri Francis OFB:[id number] DOB: February 15, 1986 DOA: 09/20/2017  PCP: Patient, No Pcp Per  Patient coming from: Home  I have personally briefly reviewed patient's old medical records in [place]  Chief Complaint: Neck pain, fever  HPI: Sherri Francis is a 32 y.o. female with medical history significant of IVDU, ongoing with last heroin use yesterday; HCV, MSSA bacteremia and endocarditis last fall.  In hospital for a few admissions between which she kept leaving AMA.  Ultimately left AMA late Sept and lost to follow up.  Patient now presents to ED with c/o 2 day h/o neck stiffness, subjective fever, intermittent headaches, CP.  Not been on any ABx for endocarditis since leaving AMA in Sept.   ED Course: Tm 99.x.  ESR 48.  MRI shows pre-vertebral edema, probably infectious.  No epidural abscess nor diskitis.  Put on zosyn / vanc and hospitalist asked to admit.   Review of Systems: As per HPI otherwise 10 point review of systems negative.   Past Medical History:  Diagnosis Date  . Anemia   . Anxiety   . Depression   . Endocarditis   . Hepatitis C   . IVDU (intravenous drug user)   . PID (acute pelvic inflammatory disease)   . Suicide attempt [facility]    multiple attempts  . Teratoma   . UTI (urinary tract infection)     Past Surgical History:  Procedure Laterality Date  . LAPAROSCOPY       reports that she has been smoking cigarettes.  She has been smoking about 0.50 packs per day. She has never used smokeless tobacco. She reports that she drinks alcohol. She reports that she has current or past drug history. Drug: IV.  No Known Allergies  Family History  Problem Relation Age of Onset  . Heart disease Maternal Grandfather   . Anxiety disorder Maternal Grandfather   . Heart disease Maternal Aunt   . Alcohol abuse Father   . Drug abuse Father   . Cancer Neg Hx     Prior to Admission medications   Not on File    Physical Exam: Vitals:     09/20/17 2045 09/20/17 2100 09/20/17 2115 09/20/17 2130  BP: 123/83 (!) 128/95 123/85 118/80  Pulse: (!) 105 100 (!) 101 (!) 101  Resp: (!) 25 (!) 21 20 (!) 23  Temp:      TempSrc:      SpO2: 98% 98% 98% 98%  Weight:        Constitutional: NAD, calm, comfortable Eyes: PERRL, lids and conjunctivae normal ENMT: Mucous membranes are moist. Posterior pharynx clear of any exudate or lesions.Normal dentition.  Neck: normal, supple, no masses, no thyromegaly Respiratory: clear to auscultation bilaterally, no wheezing, no crackles. Normal respiratory effort. No accessory muscle use.  Cardiovascular: Regular rate and rhythm, no murmurs / rubs / gallops. No extremity edema. 2+ pedal pulses. No carotid bruits.  Abdomen: no tenderness, no masses palpated. No hepatosplenomegaly. Bowel sounds positive.  Musculoskeletal: Neck tender, unable to ROM due to pain. Skin: no rashes, lesions, ulcers. No induration Neurologic: CN 2-12 grossly intact. Sensation intact, DTR normal. Strength 5/5 in all 4.  Psychiatric: Normal judgment and insight. Alert and oriented x 3. Normal mood.    Labs on Admission: I have personally reviewed following labs and imaging studies  CBC: Recent Labs  Lab 09/20/17 0758  WBC 13.2*  NEUTROABS 9.7*  HGB 10.1*  HCT 31.9*  MCV 76.9*  PLT 474*  Basic Metabolic Panel: Recent Labs  Lab 09/20/17 0758  NA 136  K 3.9  CL 104  CO2 21*  GLUCOSE 115*  BUN 7  CREATININE 0.44  CALCIUM 8.6*   GFR: CrCl cannot be calculated (Unknown ideal weight.). Liver Function Tests: Recent Labs  Lab 09/20/17 0758  AST 19  ALT 24  ALKPHOS 97  BILITOT 0.1*  PROT 7.8  ALBUMIN 3.2*   No results for input(s): LIPASE, AMYLASE in the last 168 hours. No results for input(s): AMMONIA in the last 168 hours. Coagulation Profile: No results for input(s): INR, PROTIME in the last 168 hours. Cardiac Enzymes: No results for input(s): CKTOTAL, CKMB, CKMBINDEX, TROPONINI in the last  168 hours. BNP (last 3 results) No results for input(s): PROBNP in the last 8760 hours. HbA1C: No results for input(s): HGBA1C in the last 72 hours. CBG: No results for input(s): GLUCAP in the last 168 hours. Lipid Profile: No results for input(s): CHOL, HDL, LDLCALC, TRIG, CHOLHDL, LDLDIRECT in the last 72 hours. Thyroid Function Tests: No results for input(s): TSH, T4TOTAL, FREET4, T3FREE, THYROIDAB in the last 72 hours. Anemia Panel: No results for input(s): VITAMINB12, FOLATE, FERRITIN, TIBC, IRON, RETICCTPCT in the last 72 hours. Urine analysis:    Component Value Date/Time   COLORURINE STRAW (A) 09/20/2017 1620   APPEARANCEUR CLEAR 09/20/2017 1620   LABSPEC 1.006 09/20/2017 1620   PHURINE 8.0 09/20/2017 1620   GLUCOSEU NEGATIVE 09/20/2017 1620   HGBUR NEGATIVE 09/20/2017 1620   BILIRUBINUR NEGATIVE 09/20/2017 1620   KETONESUR NEGATIVE 09/20/2017 1620   PROTEINUR NEGATIVE 09/20/2017 1620   UROBILINOGEN 0.2 10/15/2012 1341   NITRITE NEGATIVE 09/20/2017 1620   LEUKOCYTESUR SMALL (A) 09/20/2017 1620    Radiological Exams on Admission: Dg Chest 2 View  Result Date: 09/20/2017 CLINICAL DATA:  Fever and neck stiffness for 2 days. EXAM: CHEST - 2 VIEW COMPARISON:  PA and lateral chest 02/24/2017 and 01/24/2017. CT chest 01/27/2017. FINDINGS: The lungs are clear. Cavitary lesions in the right chest seen on prior examinations are not visualized today. No pneumothorax or pleural fluid. Heart size is normal. No bony abnormality. IMPRESSION: Negative chest. Electronically Signed   By: Inge Rise M.D.   On: 09/20/2017 09:23   Mr Cervical Spine Wo Contrast  Result Date: 09/20/2017 CLINICAL DATA:  Initial evaluation for possible epidural abscess. History of IVDU, endocarditis. EXAM: MRI CERVICAL SPINE WITHOUT CONTRAST TECHNIQUE: Multiplanar, multisequence MR imaging of the cervical spine was performed. No intravenous contrast was administered. COMPARISON:  None available. FINDINGS:  Alignment: Examination limited as the patient was unable to tolerate the full length of the exam. No post-contrast sequences were performed. Additionally, imaging of the thorax was ordered but not performed due to patient's inability to continue. Vertebral bodies normally aligned with preservation of the normal cervical lordosis. No listhesis. Vertebrae: Vertebral body heights maintained without evidence for acute or chronic fracture. Bone marrow signal intensity diffusely decreased on T1 weighted imaging, most commonly related to anemia, smoking, obesity, or other chronic disease. No discrete or worrisome osseous lesions. No abnormal marrow edema to suggest acute osteomyelitis. Cord: Signal intensity within the cervical spinal cord is normal. No discernible epidural collections identified on this noncontrast examination. Posterior Fossa, vertebral arteries, paraspinal tissues: Visualized brain and posterior fossa within normal limits. Scattered mucosal thickening within the left maxillary sinus. There is abnormal prevertebral edema/effusion normal intravascular flow voids present within the vertebral arteries bilaterally. Adjacent longus coli musculature relatively normal in appearance. No other significant soft tissue edema  or inflammatory changes within the visualized neck. Mildly prominent bilateral level 2 lymph nodes measure to the upper limits of normal at approximately 1 cm in short axis. Extending from C1 through C5, nonspecific, but could reflect sequelae of acute infection Disc levels: No significant disc pathology seen within the cervical spine. No findings to suggest acute discitis. No disc bulge or disc protrusion. No significant canal or foraminal stenosis. IMPRESSION: 1. Limited study due to patient's inability to tolerate the full length of the exam. Noncontrast imaging of the cervical spinal E was performed. 2. Abnormal prevertebral edema extending from C1-2 inferiorly through C5. While this  finding is nonspecific, this is most suspicious for possible infection given provided history. Source not entirely clear, as there are no other imaging findings to suggest osteomyelitis discitis on this exam. No discernible epidural abscess or other intraspinal infection. Further evaluation with dedicated neck CT may be helpful for further evaluation as clinically warranted. Electronically Signed   By: Jeannine Boga M.D.   On: 09/20/2017 20:06   Mr Thoracic Spine W Contrast  Result Date: 09/20/2017 CLINICAL DATA:  Initial evaluation for possible epidural abscess. History of IVDU, endocarditis. EXAM: MRI CERVICAL SPINE WITHOUT CONTRAST TECHNIQUE: Multiplanar, multisequence MR imaging of the cervical spine was performed. No intravenous contrast was administered. COMPARISON:  None available. FINDINGS: Alignment: Examination limited as the patient was unable to tolerate the full length of the exam. No post-contrast sequences were performed. Additionally, imaging of the thorax was ordered but not performed due to patient's inability to continue. Vertebral bodies normally aligned with preservation of the normal cervical lordosis. No listhesis. Vertebrae: Vertebral body heights maintained without evidence for acute or chronic fracture. Bone marrow signal intensity diffusely decreased on T1 weighted imaging, most commonly related to anemia, smoking, obesity, or other chronic disease. No discrete or worrisome osseous lesions. No abnormal marrow edema to suggest acute osteomyelitis. Cord: Signal intensity within the cervical spinal cord is normal. No discernible epidural collections identified on this noncontrast examination. Posterior Fossa, vertebral arteries, paraspinal tissues: Visualized brain and posterior fossa within normal limits. Scattered mucosal thickening within the left maxillary sinus. There is abnormal prevertebral edema/effusion normal intravascular flow voids present within the vertebral arteries  bilaterally. Adjacent longus coli musculature relatively normal in appearance. No other significant soft tissue edema or inflammatory changes within the visualized neck. Mildly prominent bilateral level 2 lymph nodes measure to the upper limits of normal at approximately 1 cm in short axis. Extending from C1 through C5, nonspecific, but could reflect sequelae of acute infection Disc levels: No significant disc pathology seen within the cervical spine. No findings to suggest acute discitis. No disc bulge or disc protrusion. No significant canal or foraminal stenosis. IMPRESSION: 1. Limited study due to patient's inability to tolerate the full length of the exam. Noncontrast imaging of the cervical spinal E was performed. 2. Abnormal prevertebral edema extending from C1-2 inferiorly through C5. While this finding is nonspecific, this is most suspicious for possible infection given provided history. Source not entirely clear, as there are no other imaging findings to suggest osteomyelitis discitis on this exam. No discernible epidural abscess or other intraspinal infection. Further evaluation with dedicated neck CT may be helpful for further evaluation as clinically warranted. Electronically Signed   By: Jeannine Boga M.D.   On: 09/20/2017 20:06    EKG: Independently reviewed.  Assessment/Plan Principal Problem:   Acute osteomyelitis of cervical spine (HCC) Active Problems:   Opioid type dependence, continuous (HCC)   Endocarditis  1. Pre-vertebral edema of C spine - 1. Will leave on zosyn / vanc for now 2. BCx pending 3. Call ID in AM 2. MSSA endocarditis - 1. Possibly incompletely treated after leaving hospital AMA in sept 2. BCx pending 3. Call ID in AM 4. Will put on tele monitor for now. 3. Opiate abuse - 1. Ongoing and active 2. Norco for pain control of C-Spine. 4. EtOH use - CIWA  DVT prophylaxis: Lovenox Code Status: Full Family Communication: No family in  room Disposition Plan: TBD Consults called: None, call ID in AM Admission status: Admit to inpatient   [place], [place] [facility] Pager [phone]  If 7AM-7PM, please contact day team taking care of patient [email or web address] Password [facility]  09/20/2017, 10:12 PM

## 2017-09-20 NOTE — ED Provider Notes (Signed)
Care assumed from previous provider PA [place]. Please see note for further details. Briefly, patient is a 32 y.o. female with hx of IVDU who came in to ER for neck pain. Afebrile throughout ER stay today. Case discussed, plan agreed upon. Will follow up on pending MR c and t spine with main concern of abscess. Anticipate admission for possible endocarditis.    Repeat temp check at 99.3. Still afebrile. UA without signs of infection.  Unfortunately, patient unable to tolerate MR and only non-contrast images were able to be obtained. Does show abnormal prevertebral edema of C1-C5 concerning for infectious etiology. Blood cx's already obtained. Started on vanc/zosyn.   9:24 PM - Consulted neurosurgery, Dr. Annette Stable, who does not believe there is anything to do from a neurosurgery standpoint. Recommends medical admission and IV ABX for prevertebral infection.    Consulted hospitalist who will admit.    Ward, Ozella Almond, PA-C 09/20/17 2149    Pattricia Boss, MD 09/21/17 [phone]

## 2017-09-20 NOTE — ED Triage Notes (Addendum)
Pt in form home via [facility] with fever and neck stiffness x 2 days. Temp 98.1 oral for EMS. Denies any n/v or HA. Hx of endocarditis, polysub abuse. A&ox4

## 2017-09-20 NOTE — Progress Notes (Signed)
Pharmacy Antibiotic Note  Sherri Francis is a 32 y.o. female admitted on 09/20/2017 with possible epidural abscess.  Pharmacy has been consulted for vancomycin/zosyn dosing. Afeb, WBC 13.2. SCr 0.44 on admit, CrCl~60.  Plan: Zosyn 3.375g IV (82min infusion) x1; then 3.375g IV q8h (4h infusion) Vancomycin 1g IV x1; then 500mg  IV q12h Monitor clinical progress, c/s, renal function F/u de-escalation plan/LOT, vancomycin trough as indicated   Weight: 102 lb (46.3 kg)  Temp (24hrs), Avg:99.4 F (37.4 C), Min:99.3 F (37.4 C), Max:99.5 F (37.5 C)  Recent Labs  Lab 09/20/17 0758  WBC 13.2*  CREATININE 0.44    CrCl cannot be calculated (Unknown ideal weight.).    No Known Allergies  Antimicrobials this admission: 4/15 vancomycin >>  4/15 zosyn >>   Dose adjustments this admission:   Microbiology results:   Elicia Lamp, PharmD, BCPS Clinical Pharmacist 09/20/2017 9:22 PM

## 2017-09-20 NOTE — ED Notes (Signed)
Pt lifted neck off bed and ambulated to bathroom by self.

## 2017-09-20 NOTE — ED Notes (Signed)
Called received from MRI. Patient moving around with complaints of pain. EDP aware

## 2017-09-20 NOTE — ED Notes (Signed)
Patient transported to MRI. IV fluids paused

## 2017-09-21 ENCOUNTER — Other Ambulatory Visit: Payer: Self-pay

## 2017-09-21 ENCOUNTER — Encounter (HOSPITAL_COMMUNITY): Payer: Self-pay

## 2017-09-21 DIAGNOSIS — Z8619 Personal history of other infectious and parasitic diseases: Secondary | ICD-10-CM

## 2017-09-21 DIAGNOSIS — M4622 Osteomyelitis of vertebra, cervical region: Secondary | ICD-10-CM

## 2017-09-21 DIAGNOSIS — G9519 Other vascular myelopathies: Secondary | ICD-10-CM

## 2017-09-21 DIAGNOSIS — F1123 Opioid dependence with withdrawal: Secondary | ICD-10-CM

## 2017-09-21 DIAGNOSIS — Z881 Allergy status to other antibiotic agents status: Secondary | ICD-10-CM

## 2017-09-21 DIAGNOSIS — R7881 Bacteremia: Secondary | ICD-10-CM

## 2017-09-21 DIAGNOSIS — F17210 Nicotine dependence, cigarettes, uncomplicated: Secondary | ICD-10-CM

## 2017-09-21 DIAGNOSIS — Z813 Family history of other psychoactive substance abuse and dependence: Secondary | ICD-10-CM

## 2017-09-21 LAB — BASIC METABOLIC PANEL
ANION GAP: 10 (ref 5–15)
BUN: 7 mg/dL (ref 6–20)
CO2: 20 mmol/L — AB (ref 22–32)
Calcium: 8.6 mg/dL — ABNORMAL LOW (ref 8.9–10.3)
Chloride: 106 mmol/L (ref 101–111)
Creatinine, Ser: 0.46 mg/dL (ref 0.44–1.00)
GFR calc Af Amer: >60 mL/min (ref >60)
GFR calc non Af Amer: >60 mL/min (ref >60)
GLUCOSE: 112 mg/dL — AB (ref 65–99)
Potassium: 3.6 mmol/L (ref 3.5–5.1)
Sodium: 136 mmol/L (ref 135–145)

## 2017-09-21 LAB — CBC
HEMATOCRIT: 32 % — AB (ref 36.0–46.0)
Hemoglobin: 10 g/dL — ABNORMAL LOW (ref 12.0–15.0)
MCH: 23.8 pg — ABNORMAL LOW (ref 26.0–34.0)
MCHC: 31.3 g/dL (ref 30.0–36.0)
MCV: 76.2 fL — AB (ref 78.0–100.0)
Platelets: 491 10*3/uL — ABNORMAL HIGH (ref 150–400)
RBC: 4.2 MIL/uL (ref 3.87–5.11)
RDW: 15 % (ref 11.5–15.5)
WBC: 10.7 10*3/uL — AB (ref 4.0–10.5)

## 2017-09-21 LAB — BLOOD CULTURE ID PANEL (REFLEXED)
Acinetobacter baumannii: NOT DETECTED
CANDIDA ALBICANS: NOT DETECTED
CANDIDA GLABRATA: NOT DETECTED
CANDIDA KRUSEI: NOT DETECTED
CANDIDA PARAPSILOSIS: NOT DETECTED
CANDIDA TROPICALIS: NOT DETECTED
ENTEROBACTERIACEAE SPECIES: NOT DETECTED
ESCHERICHIA COLI: NOT DETECTED
Enterobacter cloacae complex: NOT DETECTED
Enterococcus species: NOT DETECTED
Haemophilus influenzae: NOT DETECTED
KLEBSIELLA OXYTOCA: NOT DETECTED
KLEBSIELLA PNEUMONIAE: NOT DETECTED
Listeria monocytogenes: NOT DETECTED
Methicillin resistance: NOT DETECTED
Neisseria meningitidis: NOT DETECTED
Proteus species: NOT DETECTED
Pseudomonas aeruginosa: NOT DETECTED
STREPTOCOCCUS PYOGENES: NOT DETECTED
STREPTOCOCCUS SPECIES: NOT DETECTED
Serratia marcescens: NOT DETECTED
Staphylococcus aureus (BCID): DETECTED — AB
Staphylococcus species: DETECTED — AB
Streptococcus agalactiae: NOT DETECTED
Streptococcus pneumoniae: NOT DETECTED

## 2017-09-21 LAB — C-REACTIVE PROTEIN: CRP: 5.2 mg/dL — AB (ref <1.0)

## 2017-09-21 MED ORDER — BUPRENORPHINE HCL-NALOXONE HCL 8-2 MG SL SUBL
2.0000 | SUBLINGUAL_TABLET | Freq: Every day | SUBLINGUAL | Status: DC
  Filled 2017-09-21: qty 2

## 2017-09-21 MED ORDER — SODIUM CHLORIDE 0.9 % IV SOLN
2.0000 g | INTRAVENOUS | Status: DC
  Administered 2017-09-21 (×2): 2 g via INTRAVENOUS
  Filled 2017-09-21 (×7): qty 2000

## 2017-09-21 MED ORDER — CEFAZOLIN SODIUM-DEXTROSE 2-4 GM/100ML-% IV SOLN
2.0000 g | Freq: Three times a day (TID) | INTRAVENOUS | Status: DC
  Administered 2017-09-21: 2 g via INTRAVENOUS
  Filled 2017-09-21 (×2): qty 100

## 2017-09-21 NOTE — Progress Notes (Signed)
PHARMACY - PHYSICIAN COMMUNICATION CRITICAL VALUE ALERT - BLOOD CULTURE IDENTIFICATION (BCID)  Sherri Francis is an 32 y.o. female who presented to [facility] on 09/20/2017 with a chief complaint of neck pain/fever  Assessment:  Hx of untreated MSSA bacteremia/endocarditis   Name of physician (or Provider) Contacted: Dr. Alcario Drought  Current antibiotics: Vancomycin/Zosyn  Changes to prescribed antibiotics recommended:  DC Vancomycin/Zosyn Start Nafcillin 2g IV q4h ID consult  Results for orders placed or performed during the hospital encounter of 09/20/17  Blood Culture ID Panel (Reflexed) (Collected: 09/20/2017  9:04 AM)  Result Value Ref Range   Enterococcus species NOT DETECTED NOT DETECTED   Listeria monocytogenes NOT DETECTED NOT DETECTED   Staphylococcus species DETECTED (A) NOT DETECTED   Staphylococcus aureus DETECTED (A) NOT DETECTED   Methicillin resistance NOT DETECTED NOT DETECTED   Streptococcus species NOT DETECTED NOT DETECTED   Streptococcus agalactiae NOT DETECTED NOT DETECTED   Streptococcus pneumoniae NOT DETECTED NOT DETECTED   Streptococcus pyogenes NOT DETECTED NOT DETECTED   Acinetobacter baumannii NOT DETECTED NOT DETECTED   Enterobacteriaceae species NOT DETECTED NOT DETECTED   Enterobacter cloacae complex NOT DETECTED NOT DETECTED   Escherichia coli NOT DETECTED NOT DETECTED   Klebsiella oxytoca NOT DETECTED NOT DETECTED   Klebsiella pneumoniae NOT DETECTED NOT DETECTED   Proteus species NOT DETECTED NOT DETECTED   Serratia marcescens NOT DETECTED NOT DETECTED   Haemophilus influenzae NOT DETECTED NOT DETECTED   Neisseria meningitidis NOT DETECTED NOT DETECTED   Pseudomonas aeruginosa NOT DETECTED NOT DETECTED   Candida albicans NOT DETECTED NOT DETECTED   Candida glabrata NOT DETECTED NOT DETECTED   Candida krusei NOT DETECTED NOT DETECTED   Candida parapsilosis NOT DETECTED NOT DETECTED   Candida tropicalis NOT DETECTED NOT DETECTED    [name] 09/21/2017  1:33 AM

## 2017-09-21 NOTE — Progress Notes (Signed)
Pt with bags in hallway stating she was leaving.  According to her mother, Lynelle Smoke, pt had called a person to pick her up.  Despite efforts for her to stay for treatment or to speak to MD before leaving AMA, pt refused.  Mother offered support and escorted back to pt's room for belongings.  Pt RN aware and contacting MD.

## 2017-09-21 NOTE — Progress Notes (Signed)
Mother requesting information on drug treatment centers for her daughter when she is medically ready for d/c. CM provided her with outpatient drug rehabs and inpatient drug rehabs. CM informed patients mother that she would have to make arrangement with the facilities prior to d/c. CSW updated. CM following.

## 2017-09-21 NOTE — Progress Notes (Signed)
Patient said she is in pain.  She pulled her IV and heart monitor and walked out of her room.  RN, NT and her mother tried to advised her to stayed and get help here in the hospital.  Pain medication has been given to her as per order.  Patient refused her suboxone offered to her at noon.Patient is adamant about getting out of the hospital and said that she is already 34 and it is her rights to leave against medical advice.  Dr Loleta Books notified this incident

## 2017-09-21 NOTE — ED Notes (Signed)
Patient is very restless, states she is withdrawing. And needs something. Assisted patient up to bathroom.

## 2017-09-21 NOTE — Consult Note (Addendum)
[facility]    Date of Admission:  09/20/2017  Total days of antibiotics: 1 nafcillin               Reason for Consult: Prev TV Endocarditis, bacteremia    Referring Provider: Danford   Assessment: Prevertebral edema C1-5 MSSA bacteremia Previous MSSA and Strep TV IE Hepatitis C Opioid use d/o withdrawing  Plan: 1. Would change nafcillin to ancef 2. Await her repeat BCx 3. Repeat TEE pending 4. Detox,  Will start her on suboxonne 5. Consider neurosurgery comment on her MRI?  Comment- Her mom is present and is interested in a plan for her long term detox as well as health care.  Starting her on suboxone now may make her w/d worse in the short term. I d/w pharm, her nurse and [facility].  Thank you so much for this interesting consult,  Principal Problem:   Acute osteomyelitis of cervical spine (HCC) Active Problems:   Bacteremia due to Staphylococcus aureus   Opioid type dependence, continuous (HCC)   Endocarditis   . enoxaparin (LOVENOX) injection  40 mg Subcutaneous Daily  . folic acid  1 mg Oral Daily  . multivitamin with minerals  1 tablet Oral Daily  . thiamine  100 mg Oral Daily   Or  . thiamine  100 mg Intravenous Daily    HPI: Sherri Francis is a 32 y.o. female with hx of IVDA, prev partially treated TV IE with septic emboli (01-2017 and 02-2017). Her BCx was MSSA, viridans strep. She left AMA 9-8 (anbx end date 9-13), then returned with fever 9-15. Her nafcillin was restarted and she again left AMA on 9-18. She returned 9-20 and again left AMA on 9-23.   She returns 9-15 with neck stiffness, fever, headaches, CP. She was found on MRI to have: Abnormal prevertebral edema extending from C1-2 inferiorly through C5.  This was suspicious for infection, no abscess or osteo was seen.  She was restarted on anbx on adm.      Review of Systems: Review of Systems  Constitutional: Positive for chills and fever.  Respiratory: Negative  for cough.   Gastrointestinal: Negative for constipation and diarrhea.  Genitourinary: Negative for dysuria.  Psychiatric/Behavioral: The patient is nervous/anxious.   "I'm withdrawing, if you don't give me suboxone, I'm going to leave."  Please see HPI. All other systems reviewed and negative.   Past Medical History:  Diagnosis Date  . Anemia   . Anxiety   . Depression   . Endocarditis   . Hepatitis C   . IVDU (intravenous drug user)   . PID (acute pelvic inflammatory disease)   . Suicide attempt [facility])    multiple attempts  . Teratoma   . UTI (urinary tract infection)     Social History   Tobacco Use  . Smoking status: Current Every Day Smoker    Packs/day: 0.50    Types: Cigarettes  . Smokeless tobacco: Never Used  Substance Use Topics  . Alcohol use: Yes    Comment: 6 beers perday- 2 years ago was last time hx of alcoholism per pt.  . Drug use: Yes    Types: IV    Comment: heroin, last 2 days prior to admission    Family History  Problem Relation Age of Onset  . Heart disease Maternal Grandfather   . Anxiety disorder Maternal Grandfather   . Heart disease Maternal Aunt   . Alcohol abuse Father   .  Drug abuse Father   . Cancer Neg Hx      Medications:  Scheduled: . enoxaparin (LOVENOX) injection  40 mg Subcutaneous Daily  . folic acid  1 mg Oral Daily  . multivitamin with minerals  1 tablet Oral Daily  . thiamine  100 mg Oral Daily   Or  . thiamine  100 mg Intravenous Daily    Abtx:  Anti-infectives (From admission, onward)   Start     Dose/Rate Route Frequency Ordered Stop   09/21/17 1000  vancomycin (VANCOCIN) 500 mg in sodium chloride 0.9 % 100 mL IVPB  Status:  Discontinued     500 mg 100 mL/hr over 60 Minutes Intravenous Every 12 hours 09/20/17 2126 09/21/17 0108   09/21/17 0400  piperacillin-tazobactam (ZOSYN) IVPB 3.375 g  Status:  Discontinued     3.375 g 12.5 mL/hr over 240 Minutes Intravenous Every 8 hours 09/20/17 2126 09/21/17 0108    09/21/17 0115  nafcillin 2 g in sodium chloride 0.9 % 100 mL IVPB     2 g 200 mL/hr over 30 Minutes Intravenous Every 4 hours 09/21/17 0113     09/20/17 2130  piperacillin-tazobactam (ZOSYN) IVPB 3.375 g     3.375 g 100 mL/hr over 30 Minutes Intravenous  Once 09/20/17 2125 09/20/17 2217   09/20/17 2130  vancomycin (VANCOCIN) IVPB 1000 mg/200 mL premix     1,000 mg 200 mL/hr over 60 Minutes Intravenous  Once 09/20/17 2125 09/20/17 2321        OBJECTIVE: Blood pressure (!) 127/91, pulse (!) 120, temperature 99.1 F (37.3 C), temperature source Oral, resp. rate 20, weight 46.3 kg (102 lb), last menstrual period 03/08/2017, SpO2 99 %.  Physical Exam  Constitutional: She is oriented to person, place, and time. She appears distressed.  HENT:  Mouth/Throat: No oropharyngeal exudate.  Eyes: Pupils are equal, round, and reactive to light. EOM are normal.  Neck: Neck supple.  Cardiovascular: Normal rate, regular rhythm and normal heart sounds.  Pulmonary/Chest: Effort normal and breath sounds normal.  Abdominal: Soft. Bowel sounds are normal. She exhibits no distension. There is no tenderness.  Musculoskeletal: She exhibits no edema.  Lymphadenopathy:    She has no cervical adenopathy.  Neurological: She is alert and oriented to person, place, and time.  Skin: Skin is warm. She is diaphoretic.     Psychiatric: Her mood appears anxious. Her speech is rapid and/or pressured. She is agitated.    Lab Results Results for orders placed or performed during the hospital encounter of 09/20/17 (from the past 48 hour(s))  Comprehensive metabolic panel     Status: Abnormal   Collection Time: 09/20/17  7:58 AM  Result Value Ref Range   Sodium 136 135 - 145 mmol/L   Potassium 3.9 3.5 - 5.1 mmol/L   Chloride 104 101 - 111 mmol/L   CO2 21 (L) 22 - 32 mmol/L   Glucose, Bld 115 (H) 65 - 99 mg/dL   BUN 7 6 - 20 mg/dL   Creatinine, Ser 0.44 0.44 - 1.00 mg/dL   Calcium 8.6 (L) 8.9 - 10.3 mg/dL    Total Protein 7.8 6.5 - 8.1 g/dL   Albumin 3.2 (L) 3.5 - 5.0 g/dL   AST 19 15 - 41 U/L   ALT 24 14 - 54 U/L   Alkaline Phosphatase 97 38 - 126 U/L   Total Bilirubin 0.1 (L) 0.3 - 1.2 mg/dL   GFR calc non Af Amer >60 >60 mL/min   GFR calc Af  Amer >60 >60 mL/min    Comment: (NOTE) The eGFR has been calculated using the CKD EPI equation. This calculation has not been validated in all clinical situations. eGFR's persistently <60 mL/min signify possible Chronic Kidney Disease.    Anion gap 11 5 - 15    Comment: Performed at [facility] [address], [place], [place] [address]  CBC with Differential     Status: Abnormal   Collection Time: 09/20/17  7:58 AM  Result Value Ref Range   WBC 13.2 (H) 4.0 - 10.5 K/uL   RBC 4.15 3.87 - 5.11 MIL/uL   Hemoglobin 10.1 (L) 12.0 - 15.0 g/dL   HCT 31.9 (L) 36.0 - 46.0 %   MCV 76.9 (L) 78.0 - 100.0 fL   MCH 24.3 (L) 26.0 - 34.0 pg   MCHC 31.7 30.0 - 36.0 g/dL   RDW 15.6 (H) 11.5 - 15.5 %   Platelets 474 (H) 150 - 400 K/uL   Neutrophils Relative % 74 %   Neutro Abs 9.7 (H) 1.7 - 7.7 K/uL   Lymphocytes Relative 21 %   Lymphs Abs 2.7 0.7 - 4.0 K/uL   Monocytes Relative 5 %   Monocytes Absolute 0.7 0.1 - 1.0 K/uL   Eosinophils Relative 0 %   Eosinophils Absolute 0.0 0.0 - 0.7 K/uL   Basophils Relative 0 %   Basophils Absolute 0.0 0.0 - 0.1 K/uL    Comment: Performed at [facility], [address], [place], [place] [address]  Sedimentation rate     Status: Abnormal   Collection Time: 09/20/17  7:58 AM  Result Value Ref Range   Sed Rate 48 (H) 0 - 22 mm/hr    Comment: Performed at [facility] [address] [place], [place] [address]  I-Stat beta hCG blood, ED     Status: None   Collection Time: 09/20/17  8:27 AM  Result Value Ref Range   I-stat hCG, quantitative <5.0 <5 mIU/mL   Comment 3            Comment:   GEST. AGE      CONC.  (mIU/mL)   <=1 WEEK        5 - 50     2 WEEKS       50 - 500     3 WEEKS       100 -  10,000     4 WEEKS     1,000 - 30,000        FEMALE AND NON-PREGNANT FEMALE:     LESS THAN 5 mIU/mL   Culture, blood (routine x 2)     Status: None (Preliminary result)   Collection Time: 09/20/17  8:49 AM  Result Value Ref Range   Specimen Description BLOOD LEFT HAND    Special Requests      BOTTLES DRAWN AEROBIC AND ANAEROBIC Blood Culture adequate volume   Culture  Setup Time      GRAM POSITIVE COCCI IN BOTH AEROBIC AND ANAEROBIC BOTTLES CRITICAL VALUE NOTED.  VALUE IS CONSISTENT WITH PREVIOUSLY REPORTED AND CALLED VALUE. Performed at [facility], [place] [address], [place], [place] [address]    Culture GRAM POSITIVE COCCI    Report Status PENDING   Culture, blood (routine x 2)     Status: Abnormal (Preliminary result)   Collection Time: 09/20/17  9:04 AM  Result Value Ref Range   Specimen Description BLOOD LEFT ANTECUBITAL    Special Requests  BOTTLES DRAWN AEROBIC AND ANAEROBIC Blood Culture adequate volume   Culture  Setup Time      GRAM POSITIVE COCCI IN BOTH AEROBIC AND ANAEROBIC BOTTLES Organism ID to follow CRITICAL RESULT CALLED TO, READ BACK BY AND VERIFIED WITH: J LEDFORD PHARMD 09/21/17 0056 JDW    Culture (A)     STAPHYLOCOCCUS AUREUS SUSCEPTIBILITIES TO FOLLOW Performed at [facility], [place] [address], [place], [place] [address]    Report Status PENDING   Blood Culture ID Panel (Reflexed)     Status: Abnormal   Collection Time: 09/20/17  9:04 AM  Result Value Ref Range   Enterococcus species NOT DETECTED NOT DETECTED   Listeria monocytogenes NOT DETECTED NOT DETECTED   Staphylococcus species DETECTED (A) NOT DETECTED    Comment: CRITICAL RESULT CALLED TO, READ BACK BY AND VERIFIED WITH: J LEDFORD PHARMD 09/21/17 0056 JDW    Staphylococcus aureus DETECTED (A) NOT DETECTED    Comment: Methicillin (oxacillin) susceptible Staphylococcus aureus (MSSA). Preferred therapy is anti staphylococcal beta lactam antibiotic (Cefazolin or Nafcillin), unless  clinically contraindicated. CRITICAL RESULT CALLED TO, READ BACK BY AND VERIFIED WITH: J LEDFORD [facility] 09/21/17 0056 JDW    Methicillin resistance NOT DETECTED NOT DETECTED   Streptococcus species NOT DETECTED NOT DETECTED   Streptococcus agalactiae NOT DETECTED NOT DETECTED   Streptococcus pneumoniae NOT DETECTED NOT DETECTED   Streptococcus pyogenes NOT DETECTED NOT DETECTED   Acinetobacter baumannii NOT DETECTED NOT DETECTED   Enterobacteriaceae species NOT DETECTED NOT DETECTED   Enterobacter cloacae complex NOT DETECTED NOT DETECTED   Escherichia coli NOT DETECTED NOT DETECTED   Klebsiella oxytoca NOT DETECTED NOT DETECTED   Klebsiella pneumoniae NOT DETECTED NOT DETECTED   Proteus species NOT DETECTED NOT DETECTED   Serratia marcescens NOT DETECTED NOT DETECTED   Haemophilus influenzae NOT DETECTED NOT DETECTED   Neisseria meningitidis NOT DETECTED NOT DETECTED   Pseudomonas aeruginosa NOT DETECTED NOT DETECTED   Candida albicans NOT DETECTED NOT DETECTED   Candida glabrata NOT DETECTED NOT DETECTED   Candida krusei NOT DETECTED NOT DETECTED   Candida parapsilosis NOT DETECTED NOT DETECTED   Candida tropicalis NOT DETECTED NOT DETECTED    Comment: Performed at [facility] [address] [place], [place] [address]  Urinalysis, Routine w reflex microscopic     Status: Abnormal   Collection Time: 09/20/17  4:20 PM  Result Value Ref Range   Color, Urine STRAW (A) YELLOW   APPearance CLEAR CLEAR   Specific Gravity, Urine 1.006 1.005 - 1.030   pH 8.0 5.0 - 8.0   Glucose, UA NEGATIVE NEGATIVE mg/dL   Hgb urine dipstick NEGATIVE NEGATIVE   Bilirubin Urine NEGATIVE NEGATIVE   Ketones, ur NEGATIVE NEGATIVE mg/dL   Protein, ur NEGATIVE NEGATIVE mg/dL   Nitrite NEGATIVE NEGATIVE   Leukocytes, UA SMALL (A) NEGATIVE   RBC / HPF 0-5 0 - 5 RBC/hpf   WBC, UA 0-5 0 - 5 WBC/hpf   Bacteria, UA RARE (A) NONE SEEN   Squamous Epithelial / LPF 6-30 (A) NONE SEEN   Mucus PRESENT      Comment: Performed at [facility], [place] [address] [place] [address]  CBC     Status: Abnormal   Collection Time: 09/21/17  2:29 AM  Result Value Ref Range   WBC 10.7 (H) 4.0 - 10.5 K/uL   RBC 4.20 3.87 - 5.11 MIL/uL   Hemoglobin 10.0 (L) 12.0 - 15.0 g/dL   HCT 32.0 (L)  36.0 - 46.0 %   MCV 76.2 (L) 78.0 - 100.0 fL   MCH 23.8 (L) 26.0 - 34.0 pg   MCHC 31.3 30.0 - 36.0 g/dL   RDW 15.0 11.5 - 15.5 %   Platelets 491 (H) 150 - 400 K/uL    Comment: Performed at [facility] [address], [place], [place] [address]  Basic metabolic panel     Status: Abnormal   Collection Time: 09/21/17  2:29 AM  Result Value Ref Range   Sodium 136 135 - 145 mmol/L   Potassium 3.6 3.5 - 5.1 mmol/L   Chloride 106 101 - 111 mmol/L   CO2 20 (L) 22 - 32 mmol/L   Glucose, Bld 112 (H) 65 - 99 mg/dL   BUN 7 6 - 20 mg/dL   Creatinine, Ser 0.46 0.44 - 1.00 mg/dL   Calcium 8.6 (L) 8.9 - 10.3 mg/dL   GFR calc non Af Amer >60 >60 mL/min   GFR calc Af Amer >60 >60 mL/min    Comment: (NOTE) The eGFR has been calculated using the CKD EPI equation. This calculation has not been validated in all clinical situations. eGFR's persistently <60 mL/min signify possible Chronic Kidney Disease.    Anion gap 10 5 - 15    Comment: Performed at [facility] [address], [place], [place] [address]  C-reactive protein     Status: Abnormal   Collection Time: 09/21/17  8:11 AM  Result Value Ref Range   CRP 5.2 (H) <1.0 mg/dL    Comment: Performed at [facility] [address] [place], [place] [address]      Component Value Date/Time   SDES BLOOD LEFT ANTECUBITAL 09/20/2017 0904   SPECREQUEST  09/20/2017 0904    BOTTLES DRAWN AEROBIC AND ANAEROBIC Blood Culture adequate volume   CULT (A) 09/20/2017 0904    STAPHYLOCOCCUS AUREUS SUSCEPTIBILITIES TO FOLLOW Performed at [facility] [address], [place], [place] [address]    REPTSTATUS PENDING 09/20/2017 1937   Dg Chest  2 View  Result Date: 09/20/2017 CLINICAL DATA:  Fever and neck stiffness for 2 days. EXAM: CHEST - 2 VIEW COMPARISON:  PA and lateral chest 02/24/2017 and 01/24/2017. CT chest 01/27/2017. FINDINGS: The lungs are clear. Cavitary lesions in the right chest seen on prior examinations are not visualized today. No pneumothorax or pleural fluid. Heart size is normal. No bony abnormality. IMPRESSION: Negative chest. Electronically Signed   By: Inge Rise M.D.   On: 09/20/2017 09:23   Mr Cervical Spine Wo Contrast  Result Date: 09/20/2017 CLINICAL DATA:  Initial evaluation for possible epidural abscess. History of IVDU, endocarditis. EXAM: MRI CERVICAL SPINE WITHOUT CONTRAST TECHNIQUE: Multiplanar, multisequence MR imaging of the cervical spine was performed. No intravenous contrast was administered. COMPARISON:  None available. FINDINGS: Alignment: Examination limited as the patient was unable to tolerate the full length of the exam. No post-contrast sequences were performed. Additionally, imaging of the thorax was ordered but not performed due to patient's inability to continue. Vertebral bodies normally aligned with preservation of the normal cervical lordosis. No listhesis. Vertebrae: Vertebral body heights maintained without evidence for acute or chronic fracture. Bone marrow signal intensity diffusely decreased on T1 weighted imaging, most commonly related to anemia, smoking, obesity, or other chronic disease. No discrete or worrisome osseous lesions. No abnormal marrow edema to suggest acute osteomyelitis. Cord: Signal intensity within the cervical spinal cord is normal. No discernible epidural collections identified on this  noncontrast examination. Posterior Fossa, vertebral arteries, paraspinal tissues: Visualized brain and posterior fossa within normal limits. Scattered mucosal thickening within the left maxillary sinus. There is abnormal prevertebral edema/effusion normal intravascular flow voids  present within the vertebral arteries bilaterally. Adjacent longus coli musculature relatively normal in appearance. No other significant soft tissue edema or inflammatory changes within the visualized neck. Mildly prominent bilateral level 2 lymph nodes measure to the upper limits of normal at approximately 1 cm in short axis. Extending from C1 through C5, nonspecific, but could reflect sequelae of acute infection Disc levels: No significant disc pathology seen within the cervical spine. No findings to suggest acute discitis. No disc bulge or disc protrusion. No significant canal or foraminal stenosis. IMPRESSION: 1. Limited study due to patient's inability to tolerate the full length of the exam. Noncontrast imaging of the cervical spinal E was performed. 2. Abnormal prevertebral edema extending from C1-2 inferiorly through C5. While this finding is nonspecific, this is most suspicious for possible infection given provided history. Source not entirely clear, as there are no other imaging findings to suggest osteomyelitis discitis on this exam. No discernible epidural abscess or other intraspinal infection. Further evaluation with dedicated neck CT may be helpful for further evaluation as clinically warranted. Electronically Signed   By: Jeannine Boga M.D.   On: 09/20/2017 20:06   Mr Thoracic Spine W Contrast  Result Date: 09/20/2017 CLINICAL DATA:  Initial evaluation for possible epidural abscess. History of IVDU, endocarditis. EXAM: MRI CERVICAL SPINE WITHOUT CONTRAST TECHNIQUE: Multiplanar, multisequence MR imaging of the cervical spine was performed. No intravenous contrast was administered. COMPARISON:  None available. FINDINGS: Alignment: Examination limited as the patient was unable to tolerate the full length of the exam. No post-contrast sequences were performed. Additionally, imaging of the thorax was ordered but not performed due to patient's inability to continue. Vertebral bodies normally  aligned with preservation of the normal cervical lordosis. No listhesis. Vertebrae: Vertebral body heights maintained without evidence for acute or chronic fracture. Bone marrow signal intensity diffusely decreased on T1 weighted imaging, most commonly related to anemia, smoking, obesity, or other chronic disease. No discrete or worrisome osseous lesions. No abnormal marrow edema to suggest acute osteomyelitis. Cord: Signal intensity within the cervical spinal cord is normal. No discernible epidural collections identified on this noncontrast examination. Posterior Fossa, vertebral arteries, paraspinal tissues: Visualized brain and posterior fossa within normal limits. Scattered mucosal thickening within the left maxillary sinus. There is abnormal prevertebral edema/effusion normal intravascular flow voids present within the vertebral arteries bilaterally. Adjacent longus coli musculature relatively normal in appearance. No other significant soft tissue edema or inflammatory changes within the visualized neck. Mildly prominent bilateral level 2 lymph nodes measure to the upper limits of normal at approximately 1 cm in short axis. Extending from C1 through C5, nonspecific, but could reflect sequelae of acute infection Disc levels: No significant disc pathology seen within the cervical spine. No findings to suggest acute discitis. No disc bulge or disc protrusion. No significant canal or foraminal stenosis. IMPRESSION: 1. Limited study due to patient's inability to tolerate the full length of the exam. Noncontrast imaging of the cervical spinal E was performed. 2. Abnormal prevertebral edema extending from C1-2 inferiorly through C5. While this finding is nonspecific, this is most suspicious for possible infection given provided history. Source not entirely clear, as there are no other imaging findings to suggest osteomyelitis discitis on this exam. No discernible epidural abscess or other intraspinal infection.  Further evaluation with dedicated neck  CT may be helpful for further evaluation as clinically warranted. Electronically Signed   By: Jeannine Boga M.D.   On: 09/20/2017 20:06   Recent Results (from the past 240 hour(s))  Culture, blood (routine x 2)     Status: None (Preliminary result)   Collection Time: 09/20/17  8:49 AM  Result Value Ref Range Status   Specimen Description BLOOD LEFT HAND  Final   Special Requests   Final    BOTTLES DRAWN AEROBIC AND ANAEROBIC Blood Culture adequate volume   Culture  Setup Time   Final    GRAM POSITIVE COCCI IN BOTH AEROBIC AND ANAEROBIC BOTTLES CRITICAL VALUE NOTED.  VALUE IS CONSISTENT WITH PREVIOUSLY REPORTED AND CALLED VALUE. Performed at [facility], [place] [address], [place], [place] [address]    Culture GRAM POSITIVE COCCI  Final   Report Status PENDING  Incomplete  Culture, blood (routine x 2)     Status: Abnormal (Preliminary result)   Collection Time: 09/20/17  9:04 AM  Result Value Ref Range Status   Specimen Description BLOOD LEFT ANTECUBITAL  Final   Special Requests   Final    BOTTLES DRAWN AEROBIC AND ANAEROBIC Blood Culture adequate volume   Culture  Setup Time   Final    GRAM POSITIVE COCCI IN BOTH AEROBIC AND ANAEROBIC BOTTLES Organism ID to follow CRITICAL RESULT CALLED TO, READ BACK BY AND VERIFIED WITH: J LEDFORD PHARMD 09/21/17 0056 JDW    Culture (A)  Final    STAPHYLOCOCCUS AUREUS SUSCEPTIBILITIES TO FOLLOW Performed at [facility], [place] [address], [place], [place] [address]    Report Status PENDING  Incomplete  Blood Culture ID Panel (Reflexed)     Status: Abnormal   Collection Time: 09/20/17  9:04 AM  Result Value Ref Range Status   Enterococcus species NOT DETECTED NOT DETECTED Final   Listeria monocytogenes NOT DETECTED NOT DETECTED Final   Staphylococcus species DETECTED (A) NOT DETECTED Final    Comment: CRITICAL RESULT CALLED TO, READ BACK BY AND VERIFIED WITH: J LEDFORD PHARMD 09/21/17 0056  JDW    Staphylococcus aureus DETECTED (A) NOT DETECTED Final    Comment: Methicillin (oxacillin) susceptible Staphylococcus aureus (MSSA). Preferred therapy is anti staphylococcal beta lactam antibiotic (Cefazolin or Nafcillin), unless clinically contraindicated. CRITICAL RESULT CALLED TO, READ BACK BY AND VERIFIED WITH: J LEDFORD [facility] 09/21/17 0056 JDW    Methicillin resistance NOT DETECTED NOT DETECTED Final   Streptococcus species NOT DETECTED NOT DETECTED Final   Streptococcus agalactiae NOT DETECTED NOT DETECTED Final   Streptococcus pneumoniae NOT DETECTED NOT DETECTED Final   Streptococcus pyogenes NOT DETECTED NOT DETECTED Final   Acinetobacter baumannii NOT DETECTED NOT DETECTED Final   Enterobacteriaceae species NOT DETECTED NOT DETECTED Final   Enterobacter cloacae complex NOT DETECTED NOT DETECTED Final   Escherichia coli NOT DETECTED NOT DETECTED Final   Klebsiella oxytoca NOT DETECTED NOT DETECTED Final   Klebsiella pneumoniae NOT DETECTED NOT DETECTED Final   Proteus species NOT DETECTED NOT DETECTED Final   Serratia marcescens NOT DETECTED NOT DETECTED Final   Haemophilus influenzae NOT DETECTED NOT DETECTED Final   Neisseria meningitidis NOT DETECTED NOT DETECTED Final   Pseudomonas aeruginosa NOT DETECTED NOT DETECTED Final   Candida albicans NOT DETECTED NOT DETECTED Final   Candida glabrata NOT DETECTED NOT DETECTED Final   Candida krusei NOT DETECTED NOT DETECTED Final   Candida parapsilosis NOT DETECTED NOT DETECTED Final   Candida tropicalis NOT DETECTED NOT DETECTED Final  Comment: Performed at [facility], [place] [address] [place], [place] [address]    Microbiology: Recent Results (from the past 240 hour(s))  Culture, blood (routine x 2)     Status: None (Preliminary result)   Collection Time: 09/20/17  8:49 AM  Result Value Ref Range Status   Specimen Description BLOOD LEFT HAND  Final   Special Requests   Final    BOTTLES DRAWN AEROBIC AND  ANAEROBIC Blood Culture adequate volume   Culture  Setup Time   Final    GRAM POSITIVE COCCI IN BOTH AEROBIC AND ANAEROBIC BOTTLES CRITICAL VALUE NOTED.  VALUE IS CONSISTENT WITH PREVIOUSLY REPORTED AND CALLED VALUE. Performed at [facility], [place] [address] [place], [place] [address]    Culture GRAM POSITIVE COCCI  Final   Report Status PENDING  Incomplete  Culture, blood (routine x 2)     Status: Abnormal (Preliminary result)   Collection Time: 09/20/17  9:04 AM  Result Value Ref Range Status   Specimen Description BLOOD LEFT ANTECUBITAL  Final   Special Requests   Final    BOTTLES DRAWN AEROBIC AND ANAEROBIC Blood Culture adequate volume   Culture  Setup Time   Final    GRAM POSITIVE COCCI IN BOTH AEROBIC AND ANAEROBIC BOTTLES Organism ID to follow CRITICAL RESULT CALLED TO, READ BACK BY AND VERIFIED WITH: J LEDFORD PHARMD 09/21/17 0056 JDW    Culture (A)  Final    STAPHYLOCOCCUS AUREUS SUSCEPTIBILITIES TO FOLLOW Performed at [facility], [place] [address], [place], [place] [address]    Report Status PENDING  Incomplete  Blood Culture ID Panel (Reflexed)     Status: Abnormal   Collection Time: 09/20/17  9:04 AM  Result Value Ref Range Status   Enterococcus species NOT DETECTED NOT DETECTED Final   Listeria monocytogenes NOT DETECTED NOT DETECTED Final   Staphylococcus species DETECTED (A) NOT DETECTED Final    Comment: CRITICAL RESULT CALLED TO, READ BACK BY AND VERIFIED WITH: J LEDFORD PHARMD 09/21/17 0056 JDW    Staphylococcus aureus DETECTED (A) NOT DETECTED Final    Comment: Methicillin (oxacillin) susceptible Staphylococcus aureus (MSSA). Preferred therapy is anti staphylococcal beta lactam antibiotic (Cefazolin or Nafcillin), unless clinically contraindicated. CRITICAL RESULT CALLED TO, READ BACK BY AND VERIFIED WITH: J LEDFORD [facility] 09/21/17 0056 JDW    Methicillin resistance NOT DETECTED NOT DETECTED Final   Streptococcus species NOT DETECTED NOT DETECTED  Final   Streptococcus agalactiae NOT DETECTED NOT DETECTED Final   Streptococcus pneumoniae NOT DETECTED NOT DETECTED Final   Streptococcus pyogenes NOT DETECTED NOT DETECTED Final   Acinetobacter baumannii NOT DETECTED NOT DETECTED Final   Enterobacteriaceae species NOT DETECTED NOT DETECTED Final   Enterobacter cloacae complex NOT DETECTED NOT DETECTED Final   Escherichia coli NOT DETECTED NOT DETECTED Final   Klebsiella oxytoca NOT DETECTED NOT DETECTED Final   Klebsiella pneumoniae NOT DETECTED NOT DETECTED Final   Proteus species NOT DETECTED NOT DETECTED Final   Serratia marcescens NOT DETECTED NOT DETECTED Final   Haemophilus influenzae NOT DETECTED NOT DETECTED Final   Neisseria meningitidis NOT DETECTED NOT DETECTED Final   Pseudomonas aeruginosa NOT DETECTED NOT DETECTED Final   Candida albicans NOT DETECTED NOT DETECTED Final   Candida glabrata NOT DETECTED NOT DETECTED Final   Candida krusei NOT DETECTED NOT DETECTED Final   Candida parapsilosis NOT DETECTED NOT DETECTED Final   Candida tropicalis NOT DETECTED NOT DETECTED Final    Comment: Performed at [facility], [place] [address], [place], [place] [address]    Radiographs and labs were personally reviewed by me.   Bobby Rumpf, MD [facility] [phone] 09/21/2017, 11:01 AM

## 2017-09-21 NOTE — Discharge Summary (Signed)
[facility] Discharge Summary   Patient: Sherri Francis DEY:[id number]   PCP: Patient, No Pcp Per DOB: [birth year]   Date of admission: 09/20/2017   Date of discharge: 09/21/2017    Discharge Diagnoses:  Principal Problem:   Acute osteomyelitis of cervical spine ([place]) Active Problems:   Bacteremia due to Staphylococcus aureus   Opioid type dependence, continuous ([place])   Endocarditis   Discharge Condition: Unknown  History of present illness:  Sherri Francis is a 32 y.o. 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  In the ER found to have Tmax ~14F, ESR 48, normal renal function, MRI of the cervical spine with pre-vertebral edema, no clear evidence of abscess, discitis or osteo.  Patient could not tolerate further thoracic spine MRI due to pain.      Hospital Course:   Endocarditis Vertebral epidural infection The patient was admitted and started on Vancomycin and Zosyn.  Blood cultures drawn on admission were positive in 2/2 bottles at 14 hrs, for MSSA, she was switched to nafcillin.  ID were consulted.  TEE was scheduled for Thursday.  Opiate withdrawal She was started on low dose Norco and CIWA with on-demand lorazepam.  CIWA scores not documented, but patient got 4 doses lorazepam, 1 dose Norco.  By mid afternoon, patient was agitated per nursing.  I was paged that mother wished to speak to me, while I was attending to another patient.  Before I had arrived to the bedside, she had pulled out IV, left against medical advice of nursing.  Mother reports she texted patient begging her to come back, but patient replied that she "needed to get high", and that she was already far away.  I recommended mother encourage her to return to hospital care as soon as possible.  Prior to this, the patient had been ordered Suboxone, which she had refused.  Per mother, the patient was  agitated, naked and pacing.  I was not informed, prior to being told that patient had already left, that the patient was agitated or exhibiting anything other than expected benign moderate withdrawal symptoms.  =======Future admission======== On readmission, the patient likely needs step-down level monitoring for frequent CIWA and lorazepam dosing.  Nursing administered every lorazepam PRN dose available, but this was only available every 6 hours.    Mother reports she takes "half a gram of fentanyl" (I presume half a milligram) at a time.  Her previous record last fall (left hospital AMA 5 times, was unable to finish even 2 week course of IV antibiotics for endocarditis), it is highly likely that her withdrawal symptoms will be in excess of what is possible to manage on a medical floor.   patient left AMA     Consultations:  ID  Neurosurgery  The results of significant diagnostics from this hospitalization (including imaging, microbiology, ancillary and laboratory) are listed below for reference.    Significant Diagnostic Studies: Dg Chest 2 View  Result Date: 09/20/2017 CLINICAL DATA:  Fever and neck stiffness for 2 days. EXAM: CHEST - 2 VIEW COMPARISON:  PA and lateral chest 02/24/2017 and 01/24/2017. CT chest 01/27/2017. FINDINGS: The lungs are clear. Cavitary lesions in the right chest seen on prior examinations are not visualized today. No pneumothorax or pleural fluid. Heart size is normal. No bony abnormality. IMPRESSION: Negative chest. Electronically Signed   By: Inge Rise M.D.   On: 09/20/2017 09:23  Mr Cervical Spine Wo Contrast  Result Date: 09/20/2017 CLINICAL DATA:  Initial evaluation for possible epidural abscess. History of IVDU, endocarditis. EXAM: MRI CERVICAL SPINE WITHOUT CONTRAST TECHNIQUE: Multiplanar, multisequence MR imaging of the cervical spine was performed. No intravenous contrast was administered. COMPARISON:  None available. FINDINGS: Alignment:  Examination limited as the patient was unable to tolerate the full length of the exam. No post-contrast sequences were performed. Additionally, imaging of the thorax was ordered but not performed due to patient's inability to continue. Vertebral bodies normally aligned with preservation of the normal cervical lordosis. No listhesis. Vertebrae: Vertebral body heights maintained without evidence for acute or chronic fracture. Bone marrow signal intensity diffusely decreased on T1 weighted imaging, most commonly related to anemia, smoking, obesity, or other chronic disease. No discrete or worrisome osseous lesions. No abnormal marrow edema to suggest acute osteomyelitis. Cord: Signal intensity within the cervical spinal cord is normal. No discernible epidural collections identified on this noncontrast examination. Posterior Fossa, vertebral arteries, paraspinal tissues: Visualized brain and posterior fossa within normal limits. Scattered mucosal thickening within the left maxillary sinus. There is abnormal prevertebral edema/effusion normal intravascular flow voids present within the vertebral arteries bilaterally. Adjacent longus coli musculature relatively normal in appearance. No other significant soft tissue edema or inflammatory changes within the visualized neck. Mildly prominent bilateral level 2 lymph nodes measure to the upper limits of normal at approximately 1 cm in short axis. Extending from C1 through C5, nonspecific, but could reflect sequelae of acute infection Disc levels: No significant disc pathology seen within the cervical spine. No findings to suggest acute discitis. No disc bulge or disc protrusion. No significant canal or foraminal stenosis. IMPRESSION: 1. Limited study due to patient's inability to tolerate the full length of the exam. Noncontrast imaging of the cervical spinal E was performed. 2. Abnormal prevertebral edema extending from C1-2 inferiorly through C5. While this finding is  nonspecific, this is most suspicious for possible infection given provided history. Source not entirely clear, as there are no other imaging findings to suggest osteomyelitis discitis on this exam. No discernible epidural abscess or other intraspinal infection. Further evaluation with dedicated neck CT may be helpful for further evaluation as clinically warranted. Electronically Signed   By: Jeannine Boga M.D.   On: 09/20/2017 20:06   Mr Thoracic Spine W Contrast  Result Date: 09/20/2017 CLINICAL DATA:  Initial evaluation for possible epidural abscess. History of IVDU, endocarditis. EXAM: MRI CERVICAL SPINE WITHOUT CONTRAST TECHNIQUE: Multiplanar, multisequence MR imaging of the cervical spine was performed. No intravenous contrast was administered. COMPARISON:  None available. FINDINGS: Alignment: Examination limited as the patient was unable to tolerate the full length of the exam. No post-contrast sequences were performed. Additionally, imaging of the thorax was ordered but not performed due to patient's inability to continue. Vertebral bodies normally aligned with preservation of the normal cervical lordosis. No listhesis. Vertebrae: Vertebral body heights maintained without evidence for acute or chronic fracture. Bone marrow signal intensity diffusely decreased on T1 weighted imaging, most commonly related to anemia, smoking, obesity, or other chronic disease. No discrete or worrisome osseous lesions. No abnormal marrow edema to suggest acute osteomyelitis. Cord: Signal intensity within the cervical spinal cord is normal. No discernible epidural collections identified on this noncontrast examination. Posterior Fossa, vertebral arteries, paraspinal tissues: Visualized brain and posterior fossa within normal limits. Scattered mucosal thickening within the left maxillary sinus. There is abnormal prevertebral edema/effusion normal intravascular flow voids present within the vertebral arteries  bilaterally. Adjacent longus  coli musculature relatively normal in appearance. No other significant soft tissue edema or inflammatory changes within the visualized neck. Mildly prominent bilateral level 2 lymph nodes measure to the upper limits of normal at approximately 1 cm in short axis. Extending from C1 through C5, nonspecific, but could reflect sequelae of acute infection Disc levels: No significant disc pathology seen within the cervical spine. No findings to suggest acute discitis. No disc bulge or disc protrusion. No significant canal or foraminal stenosis. IMPRESSION: 1. Limited study due to patient's inability to tolerate the full length of the exam. Noncontrast imaging of the cervical spinal E was performed. 2. Abnormal prevertebral edema extending from C1-2 inferiorly through C5. While this finding is nonspecific, this is most suspicious for possible infection given provided history. Source not entirely clear, as there are no other imaging findings to suggest osteomyelitis discitis on this exam. No discernible epidural abscess or other intraspinal infection. Further evaluation with dedicated neck CT may be helpful for further evaluation as clinically warranted. Electronically Signed   By: Jeannine Boga M.D.   On: 09/20/2017 20:06    Microbiology: Recent Results (from the past 240 hour(s))  Culture, blood (routine x 2)     Status: None (Preliminary result)   Collection Time: 09/20/17  8:49 AM  Result Value Ref Range Status   Specimen Description BLOOD LEFT HAND  Final   Special Requests   Final    BOTTLES DRAWN AEROBIC AND ANAEROBIC Blood Culture adequate volume   Culture  Setup Time   Final    GRAM POSITIVE COCCI IN BOTH AEROBIC AND ANAEROBIC BOTTLES CRITICAL VALUE NOTED.  VALUE IS CONSISTENT WITH PREVIOUSLY REPORTED AND CALLED VALUE. Performed at [facility], [place] [address] [place], [place] [address]    Culture GRAM POSITIVE COCCI  Final   Report Status PENDING   Incomplete  Culture, blood (routine x 2)     Status: Abnormal (Preliminary result)   Collection Time: 09/20/17  9:04 AM  Result Value Ref Range Status   Specimen Description BLOOD LEFT ANTECUBITAL  Final   Special Requests   Final    BOTTLES DRAWN AEROBIC AND ANAEROBIC Blood Culture adequate volume   Culture  Setup Time   Final    GRAM POSITIVE COCCI IN BOTH AEROBIC AND ANAEROBIC BOTTLES Organism ID to follow CRITICAL RESULT CALLED TO, READ BACK BY AND VERIFIED WITH: J LEDFORD PHARMD 09/21/17 0056 JDW    Culture (A)  Final    STAPHYLOCOCCUS AUREUS SUSCEPTIBILITIES TO FOLLOW Performed at [facility], [place] [address], [place], [place] [address]    Report Status PENDING  Incomplete  Blood Culture ID Panel (Reflexed)     Status: Abnormal   Collection Time: 09/20/17  9:04 AM  Result Value Ref Range Status   Enterococcus species NOT DETECTED NOT DETECTED Final   Listeria monocytogenes NOT DETECTED NOT DETECTED Final   Staphylococcus species DETECTED (A) NOT DETECTED Final    Comment: CRITICAL RESULT CALLED TO, READ BACK BY AND VERIFIED WITH: J LEDFORD PHARMD 09/21/17 0056 JDW    Staphylococcus aureus DETECTED (A) NOT DETECTED Final    Comment: Methicillin (oxacillin) susceptible Staphylococcus aureus (MSSA). Preferred therapy is anti staphylococcal beta lactam antibiotic (Cefazolin or Nafcillin), unless clinically contraindicated. CRITICAL RESULT CALLED TO, READ BACK BY AND VERIFIED WITH: J LEDFORD [facility] 09/21/17 0056 JDW    Methicillin resistance NOT DETECTED NOT DETECTED Final   Streptococcus species NOT DETECTED NOT DETECTED Final   Streptococcus agalactiae NOT DETECTED NOT  DETECTED Final   Streptococcus pneumoniae NOT DETECTED NOT DETECTED Final   Streptococcus pyogenes NOT DETECTED NOT DETECTED Final   Acinetobacter baumannii NOT DETECTED NOT DETECTED Final   Enterobacteriaceae species NOT DETECTED NOT DETECTED Final   Enterobacter cloacae complex NOT DETECTED NOT DETECTED  Final   Escherichia coli NOT DETECTED NOT DETECTED Final   Klebsiella oxytoca NOT DETECTED NOT DETECTED Final   Klebsiella pneumoniae NOT DETECTED NOT DETECTED Final   Proteus species NOT DETECTED NOT DETECTED Final   Serratia marcescens NOT DETECTED NOT DETECTED Final   Haemophilus influenzae NOT DETECTED NOT DETECTED Final   Neisseria meningitidis NOT DETECTED NOT DETECTED Final   Pseudomonas aeruginosa NOT DETECTED NOT DETECTED Final   Candida albicans NOT DETECTED NOT DETECTED Final   Candida glabrata NOT DETECTED NOT DETECTED Final   Candida krusei NOT DETECTED NOT DETECTED Final   Candida parapsilosis NOT DETECTED NOT DETECTED Final   Candida tropicalis NOT DETECTED NOT DETECTED Final    Comment: Performed at [facility], [place] [address], [place], [place] [address]     Labs: CBC: Recent Labs  Lab 09/20/17 0758 09/21/17 0229  WBC 13.2* 10.7*  NEUTROABS 9.7*  --   HGB 10.1* 10.0*  HCT 31.9* 32.0*  MCV 76.9* 76.2*  PLT 474* 794*   Basic Metabolic Panel: Recent Labs  Lab 09/20/17 0758 09/21/17 0229  NA 136 136  K 3.9 3.6  CL 104 106  CO2 21* 20*  GLUCOSE 115* 112*  BUN 7 7  CREATININE 0.44 0.46  CALCIUM 8.6* 8.6*   Liver Function Tests: Recent Labs  Lab 09/20/17 0758  AST 19  ALT 24  ALKPHOS 97  BILITOT 0.1*  PROT 7.8  ALBUMIN 3.2*   No results for input(s): LIPASE, AMYLASE in the last 168 hours. No results for input(s): AMMONIA in the last 168 hours. Cardiac Enzymes: No results for input(s): CKTOTAL, CKMB, CKMBINDEX, TROPONINI in the last 168 hours. BNP (last 3 results) No results for input(s): BNP in the last 8760 hours. CBG: No results for input(s): GLUCAP in the last 168 hours. Time spent: 25 minutes were spent in discussion with family at the bedside and counseling regarding her bloodstream infection, presumed endocarditis, and opiate addiction and withdrawal  Signed:  Edwin Dada  [facility] 09/21/2017, 2:42 PM

## 2017-09-21 NOTE — Progress Notes (Signed)
BCx positive for MSSA a mere 14 hours after being drawn!  1) switching to nafcillin 2) repeat BCx ordered at 0900 this AM (suspect she may need BCx daily until negative). 3) definitely needs ID consult in AM, probably needs repeat TEE to see how bad valves are, if more than just tricuspid valve is effected, etc.

## 2017-09-22 ENCOUNTER — Inpatient Hospital Stay (HOSPITAL_COMMUNITY)
Admission: EM | Admit: 2017-09-22 | Discharge: 2017-10-02 | DRG: 539 | Discharge status: Against Medical Advice | Payer: Self-pay | Attending: Family Medicine | Admitting: Family Medicine

## 2017-09-22 ENCOUNTER — Encounter (HOSPITAL_COMMUNITY): Payer: Self-pay | Admitting: Emergency Medicine

## 2017-09-22 ENCOUNTER — Other Ambulatory Visit: Payer: Self-pay

## 2017-09-22 DIAGNOSIS — F17210 Nicotine dependence, cigarettes, uncomplicated: Secondary | ICD-10-CM | POA: Diagnosis present

## 2017-09-22 DIAGNOSIS — M62838 Other muscle spasm: Secondary | ICD-10-CM | POA: Diagnosis not present

## 2017-09-22 DIAGNOSIS — Z811 Family history of alcohol abuse and dependence: Secondary | ICD-10-CM

## 2017-09-22 DIAGNOSIS — B9561 Methicillin susceptible Staphylococcus aureus infection as the cause of diseases classified elsewhere: Secondary | ICD-10-CM

## 2017-09-22 DIAGNOSIS — Z5321 Procedure and treatment not carried out due to patient leaving prior to being seen by health care provider: Secondary | ICD-10-CM | POA: Diagnosis not present

## 2017-09-22 DIAGNOSIS — Z813 Family history of other psychoactive substance abuse and dependence: Secondary | ICD-10-CM

## 2017-09-22 DIAGNOSIS — F1120 Opioid dependence, uncomplicated: Secondary | ICD-10-CM | POA: Diagnosis present

## 2017-09-22 DIAGNOSIS — F419 Anxiety disorder, unspecified: Secondary | ICD-10-CM | POA: Diagnosis present

## 2017-09-22 DIAGNOSIS — Z8679 Personal history of other diseases of the circulatory system: Secondary | ICD-10-CM

## 2017-09-22 DIAGNOSIS — G2581 Restless legs syndrome: Secondary | ICD-10-CM | POA: Diagnosis present

## 2017-09-22 DIAGNOSIS — F1990 Other psychoactive substance use, unspecified, uncomplicated: Secondary | ICD-10-CM | POA: Insufficient documentation

## 2017-09-22 DIAGNOSIS — Z915 Personal history of self-harm: Secondary | ICD-10-CM

## 2017-09-22 DIAGNOSIS — G9519 Other vascular myelopathies: Secondary | ICD-10-CM

## 2017-09-22 DIAGNOSIS — M4642 Discitis, unspecified, cervical region: Secondary | ICD-10-CM | POA: Diagnosis present

## 2017-09-22 DIAGNOSIS — R7881 Bacteremia: Secondary | ICD-10-CM

## 2017-09-22 DIAGNOSIS — F1123 Opioid dependence with withdrawal: Secondary | ICD-10-CM

## 2017-09-22 DIAGNOSIS — M4622 Osteomyelitis of vertebra, cervical region: Principal | ICD-10-CM

## 2017-09-22 DIAGNOSIS — Z8619 Personal history of other infectious and parasitic diseases: Secondary | ICD-10-CM

## 2017-09-22 DIAGNOSIS — M869 Osteomyelitis, unspecified: Secondary | ICD-10-CM | POA: Insufficient documentation

## 2017-09-22 DIAGNOSIS — Q211 Atrial septal defect: Secondary | ICD-10-CM

## 2017-09-22 DIAGNOSIS — R45851 Suicidal ideations: Secondary | ICD-10-CM | POA: Diagnosis present

## 2017-09-22 DIAGNOSIS — F32A Depression, unspecified: Secondary | ICD-10-CM

## 2017-09-22 DIAGNOSIS — D649 Anemia, unspecified: Secondary | ICD-10-CM | POA: Diagnosis present

## 2017-09-22 DIAGNOSIS — G4700 Insomnia, unspecified: Secondary | ICD-10-CM | POA: Diagnosis not present

## 2017-09-22 DIAGNOSIS — F1910 Other psychoactive substance abuse, uncomplicated: Secondary | ICD-10-CM | POA: Diagnosis present

## 2017-09-22 DIAGNOSIS — G4710 Hypersomnia, unspecified: Secondary | ICD-10-CM | POA: Diagnosis present

## 2017-09-22 DIAGNOSIS — B1920 Unspecified viral hepatitis C without hepatic coma: Secondary | ICD-10-CM

## 2017-09-22 DIAGNOSIS — F329 Major depressive disorder, single episode, unspecified: Secondary | ICD-10-CM | POA: Diagnosis present

## 2017-09-22 LAB — CBC WITH DIFFERENTIAL/PLATELET
BASOS ABS: 0 10*3/uL (ref 0.0–0.1)
BASOS PCT: 0 %
EOS ABS: 0.1 10*3/uL (ref 0.0–0.7)
EOS PCT: 1 %
HCT: 33.9 % — ABNORMAL LOW (ref 36.0–46.0)
Hemoglobin: 10.5 g/dL — ABNORMAL LOW (ref 12.0–15.0)
LYMPHS PCT: 23 %
Lymphs Abs: 2.7 10*3/uL (ref 0.7–4.0)
MCH: 23.9 pg — ABNORMAL LOW (ref 26.0–34.0)
MCHC: 31 g/dL (ref 30.0–36.0)
MCV: 77 fL — AB (ref 78.0–100.0)
Monocytes Absolute: 0.8 10*3/uL (ref 0.1–1.0)
Monocytes Relative: 6 %
Neutro Abs: 8.3 10*3/uL — ABNORMAL HIGH (ref 1.7–7.7)
Neutrophils Relative %: 70 %
Platelets: 520 10*3/uL — ABNORMAL HIGH (ref 150–400)
RBC: 4.4 MIL/uL (ref 3.87–5.11)
RDW: 15 % (ref 11.5–15.5)
WBC: 12 10*3/uL — AB (ref 4.0–10.5)

## 2017-09-22 LAB — BASIC METABOLIC PANEL
ANION GAP: 9 (ref 5–15)
BUN: 5 mg/dL — ABNORMAL LOW (ref 6–20)
CO2: 21 mmol/L — ABNORMAL LOW (ref 22–32)
Calcium: 8.6 mg/dL — ABNORMAL LOW (ref 8.9–10.3)
Chloride: 106 mmol/L (ref 101–111)
Creatinine, Ser: 0.42 mg/dL — ABNORMAL LOW (ref 0.44–1.00)
GFR calc non Af Amer: >60 mL/min (ref >60)
Glucose, Bld: 120 mg/dL — ABNORMAL HIGH (ref 65–99)
POTASSIUM: 3.2 mmol/L — AB (ref 3.5–5.1)
SODIUM: 136 mmol/L (ref 135–145)

## 2017-09-22 LAB — CULTURE, BLOOD (ROUTINE X 2): Special Requests: ADEQUATE

## 2017-09-22 MED ORDER — ONDANSETRON HCL 4 MG/2ML IJ SOLN
4.0000 mg | Freq: Four times a day (QID) | INTRAMUSCULAR | Status: DC | PRN
  Administered 2017-09-24: 4 mg via INTRAVENOUS
  Filled 2017-09-22: qty 2

## 2017-09-22 MED ORDER — BUPRENORPHINE HCL-NALOXONE HCL 8-2 MG SL SUBL
1.0000 | SUBLINGUAL_TABLET | Freq: Two times a day (BID) | SUBLINGUAL | Status: DC
  Administered 2017-09-22 – 2017-10-02 (×16): 1 via SUBLINGUAL
  Filled 2017-09-22 (×18): qty 1

## 2017-09-22 MED ORDER — CEFAZOLIN SODIUM-DEXTROSE 2-4 GM/100ML-% IV SOLN
2.0000 g | Freq: Three times a day (TID) | INTRAVENOUS | Status: DC
  Administered 2017-09-22 – 2017-10-02 (×30): 2 g via INTRAVENOUS
  Filled 2017-09-22 (×33): qty 100

## 2017-09-22 MED ORDER — ENOXAPARIN SODIUM 40 MG/0.4ML ~~LOC~~ SOLN
40.0000 mg | SUBCUTANEOUS | Status: DC
  Administered 2017-09-23: 40 mg via SUBCUTANEOUS
  Filled 2017-09-22 (×8): qty 0.4

## 2017-09-22 MED ORDER — DICYCLOMINE HCL 20 MG PO TABS
20.0000 mg | ORAL_TABLET | Freq: Four times a day (QID) | ORAL | Status: AC | PRN
  Administered 2017-09-22 – 2017-09-23 (×3): 20 mg via ORAL
  Filled 2017-09-22 (×4): qty 1

## 2017-09-22 MED ORDER — HYDROXYZINE HCL 25 MG PO TABS
25.0000 mg | ORAL_TABLET | ORAL | Status: DC | PRN
  Administered 2017-09-22 (×3): 25 mg via ORAL
  Filled 2017-09-22 (×4): qty 1

## 2017-09-22 MED ORDER — METHOCARBAMOL 500 MG PO TABS
500.0000 mg | ORAL_TABLET | Freq: Three times a day (TID) | ORAL | Status: AC | PRN
  Administered 2017-09-22 – 2017-09-26 (×5): 500 mg via ORAL
  Filled 2017-09-22 (×6): qty 1

## 2017-09-22 MED ORDER — ACETAMINOPHEN 325 MG PO TABS
650.0000 mg | ORAL_TABLET | Freq: Four times a day (QID) | ORAL | Status: DC | PRN
  Administered 2017-09-22 – 2017-09-29 (×4): 650 mg via ORAL
  Filled 2017-09-22 (×4): qty 2

## 2017-09-22 MED ORDER — BUPRENORPHINE HCL-NALOXONE HCL 2-0.5 MG SL SUBL
2.0000 | SUBLINGUAL_TABLET | Freq: Every day | SUBLINGUAL | Status: DC
  Administered 2017-09-22: 2 via SUBLINGUAL
  Filled 2017-09-22: qty 2

## 2017-09-22 MED ORDER — CLONAZEPAM 0.5 MG PO TABS
0.5000 mg | ORAL_TABLET | Freq: Two times a day (BID) | ORAL | Status: DC | PRN
  Administered 2017-09-22: 0.5 mg via ORAL
  Filled 2017-09-22: qty 1

## 2017-09-22 MED ORDER — HYDROXYZINE HCL 25 MG PO TABS
25.0000 mg | ORAL_TABLET | Freq: Four times a day (QID) | ORAL | Status: DC | PRN
  Administered 2017-09-22: 25 mg via ORAL
  Filled 2017-09-22: qty 1

## 2017-09-22 MED ORDER — LOPERAMIDE HCL 2 MG PO CAPS
2.0000 mg | ORAL_CAPSULE | ORAL | Status: AC | PRN
  Administered 2017-09-23: 4 mg via ORAL
  Filled 2017-09-22 (×2): qty 2

## 2017-09-22 MED ORDER — BUPRENORPHINE HCL-NALOXONE HCL 8-2 MG SL SUBL: 1.0000 | SUBLINGUAL_TABLET | Freq: Two times a day (BID) | SUBLINGUAL | Status: DC

## 2017-09-22 MED ORDER — SODIUM CHLORIDE 0.9 % IV BOLUS
1000.0000 mL | Freq: Once | INTRAVENOUS | Status: AC
Start: 2017-09-22 — End: 2017-09-22
  Administered 2017-09-22: 1000 mL via INTRAVENOUS

## 2017-09-22 MED ORDER — BUPRENORPHINE HCL-NALOXONE HCL 2-0.5 MG SL SUBL
2.0000 | SUBLINGUAL_TABLET | Freq: Every day | SUBLINGUAL | Status: DC
  Filled 2017-09-22: qty 2

## 2017-09-22 MED ORDER — NAPROXEN 250 MG PO TABS
500.0000 mg | ORAL_TABLET | Freq: Two times a day (BID) | ORAL | Status: AC | PRN
  Administered 2017-09-22 – 2017-09-26 (×4): 500 mg via ORAL
  Filled 2017-09-22: qty 2
  Filled 2017-09-22: qty 1
  Filled 2017-09-22 (×2): qty 2
  Filled 2017-09-22: qty 1

## 2017-09-22 MED ORDER — SODIUM CHLORIDE 0.9% FLUSH
3.0000 mL | Freq: Two times a day (BID) | INTRAVENOUS | Status: DC
  Administered 2017-09-22 – 2017-09-30 (×13): 3 mL via INTRAVENOUS

## 2017-09-22 MED ORDER — LORAZEPAM 2 MG/ML IJ SOLN
1.0000 mg | Freq: Once | INTRAMUSCULAR | Status: AC
  Administered 2017-09-22: 1 mg via INTRAVENOUS
  Filled 2017-09-22: qty 1

## 2017-09-22 MED ORDER — DOCUSATE SODIUM 100 MG PO CAPS
100.0000 mg | ORAL_CAPSULE | Freq: Two times a day (BID) | ORAL | Status: DC
  Filled 2017-09-22: qty 1

## 2017-09-22 MED ORDER — ONDANSETRON HCL 4 MG PO TABS: 4.0000 mg | ORAL_TABLET | Freq: Four times a day (QID) | ORAL | Status: DC | PRN

## 2017-09-22 MED ORDER — ACETAMINOPHEN 650 MG RE SUPP: 650.0000 mg | Freq: Four times a day (QID) | RECTAL | Status: DC | PRN

## 2017-09-22 NOTE — H&P (Signed)
History and Physical    Sherri Francis VFI:[id number] DOB: 23-Dec-1985 DOA: 09/22/2017  PCP: Patient, No Pcp Per  Patient coming from:  Home  Chief Complaint: neck pain  HPI: Sherri Francis is a 32 y.o. 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  She was admitted for acute osteomyelitis of the cervical spine from 4/15-16, when she left AMA to resume use of heroin.  During her hospitalization, she was started on Vanc and Zosyn and had 2/2 cultures positive for MSSA and so was changed to nafcillin with ID following.  She was scheduled for TEE but left before this could be performed.  She is returning with intractable neck pain.  Last use of heroin was yesterday, quantity uncertain.  She injects in her hands and arms.  She is currently somnolent from 1 mg Ativan given in the ER and so reluctant to provide additional history.   ED Course:   Ancef resumed.  Given IVF and Ancef.  Placed on CIWA protocol.  She states she is willing to stay for the duration of her treatment.    Review of Systems: As per HPI; otherwise review of systems reviewed and negative.  Limited by her somnolence.  Ambulatory Status:  Ambulates without assistance  Past Medical History:  Diagnosis Date  . Anemia   . Anxiety   . Depression   . Endocarditis   . Hepatitis C   . IVDU (intravenous drug user)   . PID (acute pelvic inflammatory disease)   . Suicide attempt [facility])    multiple attempts  . Teratoma   . UTI (urinary tract infection)     Past Surgical History:  Procedure Laterality Date  . LAPAROSCOPY      Social History   Socioeconomic History  . Marital status: Single    Spouse name: Not on file  . Number of children: Not on file  . Years of education: Not on file  . Highest education level: Not on file  Occupational History  . Not on  file  Social Needs  . Financial resource strain: Not on file  . Food insecurity:    Worry: Not on file    Inability: Not on file  . Transportation needs:    Medical: Not on file    Non-medical: Not on file  Tobacco Use  . Smoking status: Current Every Day Smoker    Packs/day: 0.50    Types: Cigarettes  . Smokeless tobacco: Never Used  Substance and Sexual Activity  . Alcohol use: Yes    Comment: 6 beers perday- 2 years ago was last time hx of alcoholism per pt.  . Drug use: Yes    Types: IV    Comment: heroin, last 2 days prior to admission  . Sexual activity: Not Currently  Lifestyle  . Physical activity:    Days per week: Not on file    Minutes per session: Not on file  . Stress: Not on file  Relationships  . Social connections:    Talks on phone: Not on file    Gets together: Not on file    Attends religious service: Not on file    Active member of club or organization: Not on file    Attends meetings of clubs or organizations: Not on file    Relationship status: Not on file  . Intimate partner violence:  Fear of current or ex partner: Not on file    Emotionally abused: Not on file    Physically abused: Not on file    Forced sexual activity: Not on file  Other Topics Concern  . Not on file  Social History Narrative  . Not on file    No Known Allergies  Family History  Problem Relation Age of Onset  . Heart disease Maternal Grandfather   . Anxiety disorder Maternal Grandfather   . Heart disease Maternal Aunt   . Alcohol abuse Father   . Drug abuse Father   . Cancer Neg Hx     Prior to Admission medications   Not on File    Physical Exam: Vitals:   09/22/17 0815 09/22/17 0830 09/22/17 0845 09/22/17 0900  BP: 128/89 (!) 137/91 (!) 130/91 127/89  Pulse: (!) 101 (!) 121 (!) 102 (!) 105  Resp: 16 16 (!) 22 (!) 21  Temp:      TempSrc:      SpO2: 100% 100% 100% 100%  Weight:      Height:         General:  Appears calm and comfortable and is NAD;  thin, disheveled Eyes:  PERRL, EOMI, normal lids, iris ENT:  grossly normal hearing, lips & tongue, mmm; poor dentition Neck:  no LAD, masses or thyromegaly Cardiovascular:  RRR, no m/r/g. No LE edema.  Respiratory:   CTA bilaterally with no wheezes/rales/rhonchi.  Normal respiratory effort. Abdomen:  soft, NT, ND, NABS Skin: diffuse injection sits on both arms, primarily Musculoskeletal:  grossly normal tone BUE/BLE, good ROM, no bony abnormality Lower extremity:  No LE edema.  Limited foot exam with no ulcerations.  2+ distal pulses. Psychiatric: somnolent, speech fluent and appropriate, AOx3 Neurologic:  CN 2-12 grossly intact, moves all extremities in coordinated fashion, sensation intact    Radiological Exams on Admission: Mr Cervical Spine Wo Contrast  Result Date: 09/20/2017 CLINICAL DATA:  Initial evaluation for possible epidural abscess. History of IVDU, endocarditis. EXAM: MRI CERVICAL SPINE WITHOUT CONTRAST TECHNIQUE: Multiplanar, multisequence MR imaging of the cervical spine was performed. No intravenous contrast was administered. COMPARISON:  None available. FINDINGS: Alignment: Examination limited as the patient was unable to tolerate the full length of the exam. No post-contrast sequences were performed. Additionally, imaging of the thorax was ordered but not performed due to patient's inability to continue. Vertebral bodies normally aligned with preservation of the normal cervical lordosis. No listhesis. Vertebrae: Vertebral body heights maintained without evidence for acute or chronic fracture. Bone marrow signal intensity diffusely decreased on T1 weighted imaging, most commonly related to anemia, smoking, obesity, or other chronic disease. No discrete or worrisome osseous lesions. No abnormal marrow edema to suggest acute osteomyelitis. Cord: Signal intensity within the cervical spinal cord is normal. No discernible epidural collections identified on this noncontrast  examination. Posterior Fossa, vertebral arteries, paraspinal tissues: Visualized brain and posterior fossa within normal limits. Scattered mucosal thickening within the left maxillary sinus. There is abnormal prevertebral edema/effusion normal intravascular flow voids present within the vertebral arteries bilaterally. Adjacent longus coli musculature relatively normal in appearance. No other significant soft tissue edema or inflammatory changes within the visualized neck. Mildly prominent bilateral level 2 lymph nodes measure to the upper limits of normal at approximately 1 cm in short axis. Extending from C1 through C5, nonspecific, but could reflect sequelae of acute infection Disc levels: No significant disc pathology seen within the cervical spine. No findings to suggest acute discitis. No disc bulge  or disc protrusion. No significant canal or foraminal stenosis. IMPRESSION: 1. Limited study due to patient's inability to tolerate the full length of the exam. Noncontrast imaging of the cervical spinal E was performed. 2. Abnormal prevertebral edema extending from C1-2 inferiorly through C5. While this finding is nonspecific, this is most suspicious for possible infection given provided history. Source not entirely clear, as there are no other imaging findings to suggest osteomyelitis discitis on this exam. No discernible epidural abscess or other intraspinal infection. Further evaluation with dedicated neck CT may be helpful for further evaluation as clinically warranted. Electronically Signed   By: Jeannine Boga M.D.   On: 09/20/2017 20:06   Mr Thoracic Spine W Contrast  Result Date: 09/20/2017 CLINICAL DATA:  Initial evaluation for possible epidural abscess. History of IVDU, endocarditis. EXAM: MRI CERVICAL SPINE WITHOUT CONTRAST TECHNIQUE: Multiplanar, multisequence MR imaging of the cervical spine was performed. No intravenous contrast was administered. COMPARISON:  None available. FINDINGS:  Alignment: Examination limited as the patient was unable to tolerate the full length of the exam. No post-contrast sequences were performed. Additionally, imaging of the thorax was ordered but not performed due to patient's inability to continue. Vertebral bodies normally aligned with preservation of the normal cervical lordosis. No listhesis. Vertebrae: Vertebral body heights maintained without evidence for acute or chronic fracture. Bone marrow signal intensity diffusely decreased on T1 weighted imaging, most commonly related to anemia, smoking, obesity, or other chronic disease. No discrete or worrisome osseous lesions. No abnormal marrow edema to suggest acute osteomyelitis. Cord: Signal intensity within the cervical spinal cord is normal. No discernible epidural collections identified on this noncontrast examination. Posterior Fossa, vertebral arteries, paraspinal tissues: Visualized brain and posterior fossa within normal limits. Scattered mucosal thickening within the left maxillary sinus. There is abnormal prevertebral edema/effusion normal intravascular flow voids present within the vertebral arteries bilaterally. Adjacent longus coli musculature relatively normal in appearance. No other significant soft tissue edema or inflammatory changes within the visualized neck. Mildly prominent bilateral level 2 lymph nodes measure to the upper limits of normal at approximately 1 cm in short axis. Extending from C1 through C5, nonspecific, but could reflect sequelae of acute infection Disc levels: No significant disc pathology seen within the cervical spine. No findings to suggest acute discitis. No disc bulge or disc protrusion. No significant canal or foraminal stenosis. IMPRESSION: 1. Limited study due to patient's inability to tolerate the full length of the exam. Noncontrast imaging of the cervical spinal E was performed. 2. Abnormal prevertebral edema extending from C1-2 inferiorly through C5. While this  finding is nonspecific, this is most suspicious for possible infection given provided history. Source not entirely clear, as there are no other imaging findings to suggest osteomyelitis discitis on this exam. No discernible epidural abscess or other intraspinal infection. Further evaluation with dedicated neck CT may be helpful for further evaluation as clinically warranted. Electronically Signed   By: Jeannine Boga M.D.   On: 09/20/2017 20:06    EKG: Independently reviewed.  Sinus tachycardia with rate 103;  no evidence of acute ischemia   Labs on Admission: I have personally reviewed the available labs and imaging studies at the time of the admission.  Pertinent labs:   K+ 3.2 CO2 21 Glucose 120 WBC 12.0 Hgb 10.5 - stable Platelets 520   Assessment/Plan Principal Problem:   Acute osteomyelitis of cervical spine (HCC) Active Problems:   Bacteremia due to Staphylococcus aureus   Opioid type dependence, continuous (HCC)   Acute osteomyelitis  of the cervical spine with MSSA bacteremia -Patient previously admitted 4/15-16 and left AMA -She returns due to intractable pain -She does report a commitment to treatment this time -She has been resumed on Ancef, as per ID recommendations -Dr. Johnnye Sima has been notified of recurrent admission -She was scheduled for TEE tomorrow during the prior admission; this is likely needed once again  Opiate dependence -Long-standing heroin dependence -She has a pattern of leaving AMA and returning to the ER -She appears to already be starting to withdraw -Will initiate suboxone therapy now; while this may precipitate early withdrawal, hopefully she can reach a level of withdrawal symptom relief that will enable her to remain hospitalized and complete treatment -I spoke with Dr. Evette Doffing for assistance in initiating Suboxone.  Will start 0.4 mg SL now and recheck her in a few hours to administer a second dose if she is tolerating well.  If her  second dose also goes well, she can proceed with 8 mg SL BID starting tonight. -Will monitor on COWA protocol -prn orders from the Clonidine withdrawal order set were also ordered. -Will admit to SDU due to the frequent need to assess her symptoms and closely monitor her  DVT prophylaxis:  Lovenox Code Status: Full  Family Communication: None present  Disposition Plan:  Home once clinically improved Consults called: ID  Admission status: Admit - It is my clinical opinion that admission to [place] is reasonable and necessary because of the expectation that this patient will require hospital care that crosses at least 2 midnights to treat this condition based on the medical complexity of the problems presented.  Given the aforementioned information, the predictability of an adverse outcome is felt to be significant.     Karmen Bongo MD [facility]  If note is complete, please contact covering daytime or nighttime physician. [email or web address] Password [facility]  09/22/2017, 9:41 AM

## 2017-09-22 NOTE — Consult Note (Addendum)
   I was asked by Dr. Lorin Mercy to see if I could help with management of acute severe opioid withdrawal. Very difficult case of a 32 year old woman how uses drugs here with cervical spine osteomyelitis. She has a very high tolerance for opioids and is going through a very difficult withdrawal syndrome right now. It is hard to interview the patient, she says she is feeling very anxious, very agitated, pacing around the room, telling me she wants to leave. Mother is at the bedside, trying to calm her down and make a plan to manage her symptoms. She tells me that she is too agitated to even tell me about her symptoms. Has not been in treatment before according to her mother. Drug use has been deep for about nine years now.   I advised that we need to get her withdrawal symptoms under control. Patient says she feels very wound up, thinks that ativan will help her. She denies benzo use chronically, making benzo withdrawal unlikely. She has received a total of 8mg  suboxone so far today. I offered her another 4mg  of suboxone now for opioid withdrawal symptoms. She declined, preferring ativan. I think ativan is fine to try in our monitored setting.   Last heroin use was last night at 10pm. First Bupe dose this morning at 10am when she was in moderate withdrawal. Then another 4mg  dose at 1430. I don't think this is precipitated withdrawal from the bupe. Despite her feeling very agitated, she is not showing other signs of withdrawal, like nausea, vomiting, diarrhea, or diaphoresis. So I think the bupe is having some beneficial effect.   Plan to continue with suboxone 8mg  bid. I will check with her tomorrow, we can increase to 24 mg daily for these symptoms in 1-2 days if she tolerates it well. Consider a few days of clonazepam 0.5mg  bid PO for anxiety component.  Hopefully if we can get her symptoms under control we can talk more about her drug use and outpatient treatment options.   Axel Filler,  MD 09/22/2017, 3:36 PM

## 2017-09-22 NOTE — ED Notes (Signed)
Page sent to Dr. Lorin Mercy about medication.

## 2017-09-22 NOTE — ED Triage Notes (Signed)
Per EMS, pt coming from a [place] with complaints of neck pain for a couple of days now. Pt has hx of osteomyelitis and endocarditis. Pt was here yesterday but left AMA due to wanting to use heroin. Pt has hx of polysubstance abuse.

## 2017-09-22 NOTE — ED Notes (Signed)
Security and [employer] at bedside with this RN to administer medication.

## 2017-09-22 NOTE — ED Notes (Signed)
IVC papers taken out on the pt.  Security and [employer] in hallway.

## 2017-09-22 NOTE — ED Notes (Signed)
Meal tray delivered.

## 2017-09-22 NOTE — Clinical Social Work Note (Signed)
Clinical Social Work Assessment  Patient Details  Name: Sherri Francis MRN: [id number] Date of Birth: 04/03/1986  Date of referral:  09/22/17               Reason for consult:  Substance Use/ETOH Abuse(heroin)                Permission sought to share information with:  Family Supports Permission granted to share information::  Yes, Verbal Permission Granted  Name::     Joanna::  family  Relationship::   mother  Contact Information:  Vinie Charity [phone]  Housing/Transportation Living arrangements for the past 2 months:  [place] of Information:  Patient Patient Interpreter Needed:  None Criminal Activity/Legal Involvement Pertinent to Current Situation/Hospitalization:  No - Comment as needed(not reported at this time.) Significant Relationships:  Parents, Significant Other Lives with:  Other (Comment)(pt reports being homeless at this time. ) Do you feel safe going back to the place where you live?  No Need for family participation in patient care:  Yes (Comment)  Care giving concerns:  CSW spoke with pt and mother at beside. At this time pt expressed no concerns however CSW did observe that pt was whining and mentioning things such as being tired and wanting CSW to come later. Mother began to tell CSW information on where pt has been staying however pt stated "mom no I dont want her to know that". Mother stopped and CSW was unable to gather further information on that subject.    Social Worker assessment / plan:  CSW spoke with pt and mother at bedside. During this time CSW was informed that pt is primarily homeless and has no where to stay. Pt reports that pt has been using heroin for nine years and has been in treatment in the past but no recently. CSW sought further information from pt on supports. Pt discloses that mother, father, and boyfriend are all supports for pt. In mentioning that boyfriend was a support for pt, mother shook her head no  that pt's boyfriend was not a good support for pt.    During this assessment pt was lying in bed whimpering and complaining about being sleepy. Pt asked that CSW come back at a later time as pt expressed "Im to sleepy to talk right now". Both CSW and pt's mother encouraged pt to speak with CSW so that pt could rest with no further interruptions from CSW. Pt then became agreeable to this and provided the information above to CSW. CSW ended assessment by honoring pt's request for blanket.   Employment status:  Other (Comment)(unknown at this time. ) Insurance information:  Self Pay (Medicaid Pending) PT Recommendations:  Not assessed at this time Information / Referral to community resources:  Outpatient Substance Abuse Treatment Options, Residential Substance Abuse Treatment Options(spoke with pt and mpother about outpatinet as well as inpt SA rehab options. )  Patient/Family's Response to care:  Pt's response to care was unclear to CSW as pt wouldn't explain how much information pt did or did not understand. Pt's mother appeared to sit quietly in the corner for most of the assessment but would nod her head in understanding what CSW mentioned.   Patient/Family's Understanding of and Emotional Response to Diagnosis, Current Treatment, and Prognosis:  Pt's understanding of care was unclear to CSW. CSW attempted to explainhowever pt was really tired and whining asking to go back to sleep. Emotional response to care was whining but still being  agreeable to any resources options available to CSW at this time.   Emotional Assessment Appearance:  Appears stated age Attitude/Demeanor/Rapport:  Complaining, Other(whining) Affect (typically observed):  Agitated, Tearful/Crying, Restless Orientation:  Oriented to Self, Oriented to Place, Oriented to  Time, Oriented to Situation Alcohol / Substance use:  Illicit Drugs(heroin) Psych involvement (Current and /or in the community):  No (Comment)(not at this  time.)  Discharge Needs  Concerns to be addressed:  Basic Needs, Substance Abuse Concerns, Homelessness Readmission within the last 30 days:  Yes Current discharge risk:  Substance Abuse Barriers to Discharge:  Continued Medical Work up   [employer], [place] 09/22/2017, 8:39 AM

## 2017-09-22 NOTE — ED Notes (Signed)
PA at bedside to attempted ultrasound IV

## 2017-09-22 NOTE — ED Notes (Signed)
Missed IV stick x2

## 2017-09-22 NOTE — ED Notes (Signed)
This RN called pharmacy regarding suboxone, main pharmacy to call and give secure code for medication.

## 2017-09-22 NOTE — ED Notes (Addendum)
Pt yelling, screaming, ripping wires off. Screaming "get this shit off of me! I want to leave! I'm not staying here! I want to leave and get high! I'm getting cold chills and I'm not staying here!". Security called to be outside of room .

## 2017-09-22 NOTE — Progress Notes (Addendum)
Patient threatening to leave AMA, reports withdrawal.  I have calmed her and encouraged her to remain in the hospital.  For now, we are in agreement with the following plan: 1- repeat dose of Suboxone 4 mg SL now.  Instructions about SL administration reviewed with nurse and she is actively attempting to obtain from pharmacy. 2- If this is ineffective, will give an additional dose of Ativan.  Obviously, this is suboptimal but patient is willing to stay if provided with Ativan. 3- Will give additional 8 mg SL suboxone dose at 10 pm, as scheduled.  If the patient continues to threaten to leave AMA, I have discussed the idea of IVC with the mother.  She is fully supportive of this plan, as she understands that her daughter is at risk for dying every time she leaves the hospital.  I am reluctant to do this unless it is absolutely necessary and so will proceed with aforementioned plan for now.  Extended management of this patient has been required today and will be coded and billed accordingly.  Carlyon Shadow, M.D.

## 2017-09-22 NOTE — ED Notes (Addendum)
Dr. Vincent at bedside 

## 2017-09-22 NOTE — ED Notes (Signed)
Attempting to page Dr. Lorin Mercy

## 2017-09-22 NOTE — ED Notes (Signed)
Pt given orange sherbert.

## 2017-09-22 NOTE — ED Notes (Signed)
Helped patient on bedpan patient is resting with family at bedside

## 2017-09-22 NOTE — ED Notes (Addendum)
Pts mother at nursing station. Pt reporting to mother "I'm not going to lay in here and withdrawal this bad". This RN informed pts mother when next time she can get atarax and robaxin. Pts mother asked about giving the pt ativan again. This RN to call admitting provider.

## 2017-09-22 NOTE — ED Notes (Signed)
Pt eating lunch

## 2017-09-22 NOTE — ED Notes (Signed)
Called pharmacy, they are aware that the suboxone is ordered for now and that another dose is required.

## 2017-09-22 NOTE — Progress Notes (Signed)
Patient tolerated the initial dose of suboxone but is intermittently agitated now, alternating with somnolent.  She is requesting more Ativan.  Will give second dose of suboxone now and plan to initiate 8 mg BID dosing this evening assuming no problems.  Additionally, will increase PO Vistaril to q4h prn.  Carlyon Shadow, M.D.

## 2017-09-22 NOTE — Progress Notes (Signed)
INFECTIOUS DISEASE PROGRESS NOTE  ID: Sherri Francis is a 32 y.o. female with  Principal Problem:   Acute osteomyelitis of cervical spine ([place]) Active Problems:   Bacteremia due to Staphylococcus aureus   Opioid type dependence, continuous ([place])   Osteomyelitis ([facility])  Subjective: Resting quietly.   Abtx:  Anti-infectives (From admission, onward)   Start     Dose/Rate Route Frequency Ordered Stop   09/22/17 0530  ceFAZolin (ANCEF) IVPB 2g/100 mL premix     2 g 200 mL/hr over 30 Minutes Intravenous Every 8 hours 09/22/17 0505        Medications:  Scheduled: . buprenorphine-naloxone  1 tablet Sublingual BID  . enoxaparin (LOVENOX) injection  40 mg Subcutaneous Q24H  . sodium chloride flush  3 mL Intravenous Q12H    Objective: Vital signs in last 24 hours: Temp:  [98.5 F (36.9 C)] 98.5 F (36.9 C) (04/17 0441) Pulse Rate:  [85-130] 92 (04/17 1630) Resp:  [10-27] 25 (04/17 1630) BP: (100-139)/(65-95) 137/80 (04/17 1630) SpO2:  [45 %-100 %] 99 % (04/17 1630) Weight:  [46.3 kg (102 lb)] 46.3 kg (102 lb) (04/17 0441)   General appearance: no distress  Lab Results Recent Labs    09/21/17 0229 09/22/17 0536  WBC 10.7* 12.0*  HGB 10.0* 10.5*  HCT 32.0* 33.9*  NA 136 136  K 3.6 3.2*  CL 106 106  CO2 20* 21*  BUN 7 5*  CREATININE 0.46 0.42*   Liver Panel Recent Labs    09/20/17 0758  PROT 7.8  ALBUMIN 3.2*  AST 19  ALT 24  ALKPHOS 97  BILITOT 0.1*   Sedimentation Rate Recent Labs    09/20/17 0758  ESRSEDRATE 48*   C-Reactive Protein Recent Labs    09/21/17 0811  CRP 5.2*    Microbiology: Recent Results (from the past 240 hour(s))  Culture, blood (routine x 2)     Status: Abnormal (Preliminary result)   Collection Time: 09/20/17  8:49 AM  Result Value Ref Range Status   Specimen Description BLOOD LEFT HAND  Final   Special Requests   Final    BOTTLES DRAWN AEROBIC AND ANAEROBIC Blood Culture adequate volume   Culture  Setup Time   Final      GRAM POSITIVE COCCI IN BOTH AEROBIC AND ANAEROBIC BOTTLES CRITICAL VALUE NOTED.  VALUE IS CONSISTENT WITH PREVIOUSLY REPORTED AND CALLED VALUE.    Culture (A)  Final    STAPHYLOCOCCUS AUREUS SUSCEPTIBILITIES PERFORMED ON PREVIOUS CULTURE WITHIN THE LAST 5 DAYS. Performed at [facility], [place] [address], [place], [place] [address]    Report Status PENDING  Incomplete  Culture, blood (routine x 2)     Status: Abnormal   Collection Time: 09/20/17  9:04 AM  Result Value Ref Range Status   Specimen Description BLOOD LEFT ANTECUBITAL  Final   Special Requests   Final    BOTTLES DRAWN AEROBIC AND ANAEROBIC Blood Culture adequate volume   Culture  Setup Time   Final    GRAM POSITIVE COCCI IN BOTH AEROBIC AND ANAEROBIC BOTTLES Organism ID to follow CRITICAL RESULT CALLED TO, READ BACK BY AND VERIFIED WITH: Karsten Ro [facility] 09/21/17 0056 JDW Performed at [facility], [place] [address], [place], [place] [address]    Culture STAPHYLOCOCCUS AUREUS (A)  Final   Report Status 09/22/2017 FINAL  Final   Organism ID, Bacteria STAPHYLOCOCCUS AUREUS  Final      Susceptibility   Staphylococcus aureus - MIC*  CIPROFLOXACIN <=0.5 SENSITIVE Sensitive     ERYTHROMYCIN <=0.25 SENSITIVE Sensitive     GENTAMICIN <=0.5 SENSITIVE Sensitive     OXACILLIN 0.5 SENSITIVE Sensitive     TETRACYCLINE <=1 SENSITIVE Sensitive     VANCOMYCIN <=0.5 SENSITIVE Sensitive     TRIMETH/SULFA <=10 SENSITIVE Sensitive     CLINDAMYCIN <=0.25 SENSITIVE Sensitive     RIFAMPIN <=0.5 SENSITIVE Sensitive     Inducible Clindamycin NEGATIVE Sensitive     * STAPHYLOCOCCUS AUREUS  Blood Culture ID Panel (Reflexed)     Status: Abnormal   Collection Time: 09/20/17  9:04 AM  Result Value Ref Range Status   Enterococcus species NOT DETECTED NOT DETECTED Final   Listeria monocytogenes NOT DETECTED NOT DETECTED Final   Staphylococcus species DETECTED (A) NOT DETECTED Final    Comment: CRITICAL RESULT CALLED TO, READ BACK  BY AND VERIFIED WITH: J LEDFORD PHARMD 09/21/17 0056 JDW    Staphylococcus aureus DETECTED (A) NOT DETECTED Final    Comment: Methicillin (oxacillin) susceptible Staphylococcus aureus (MSSA). Preferred therapy is anti staphylococcal beta lactam antibiotic (Cefazolin or Nafcillin), unless clinically contraindicated. CRITICAL RESULT CALLED TO, READ BACK BY AND VERIFIED WITH: J LEDFORD [facility] 09/21/17 0056 JDW    Methicillin resistance NOT DETECTED NOT DETECTED Final   Streptococcus species NOT DETECTED NOT DETECTED Final   Streptococcus agalactiae NOT DETECTED NOT DETECTED Final   Streptococcus pneumoniae NOT DETECTED NOT DETECTED Final   Streptococcus pyogenes NOT DETECTED NOT DETECTED Final   Acinetobacter baumannii NOT DETECTED NOT DETECTED Final   Enterobacteriaceae species NOT DETECTED NOT DETECTED Final   Enterobacter cloacae complex NOT DETECTED NOT DETECTED Final   Escherichia coli NOT DETECTED NOT DETECTED Final   Klebsiella oxytoca NOT DETECTED NOT DETECTED Final   Klebsiella pneumoniae NOT DETECTED NOT DETECTED Final   Proteus species NOT DETECTED NOT DETECTED Final   Serratia marcescens NOT DETECTED NOT DETECTED Final   Haemophilus influenzae NOT DETECTED NOT DETECTED Final   Neisseria meningitidis NOT DETECTED NOT DETECTED Final   Pseudomonas aeruginosa NOT DETECTED NOT DETECTED Final   Candida albicans NOT DETECTED NOT DETECTED Final   Candida glabrata NOT DETECTED NOT DETECTED Final   Candida krusei NOT DETECTED NOT DETECTED Final   Candida parapsilosis NOT DETECTED NOT DETECTED Final   Candida tropicalis NOT DETECTED NOT DETECTED Final    Comment: Performed at [facility], [place]. [address], [place], [place] [address]  Culture, blood (single)     Status: None (Preliminary result)   Collection Time: 09/21/17  8:10 AM  Result Value Ref Range Status   Specimen Description BLOOD HAND  Final   Special Requests   Final    BOTTLES DRAWN AEROBIC ONLY Blood Culture adequate  volume   Culture   Final    NO GROWTH 1 DAY Performed at [facility], [place] [address], [place], [place] [address]    Report Status PENDING  Incomplete    Studies/Results: Mr Cervical Spine Wo Contrast  Result Date: 09/20/2017 CLINICAL DATA:  Initial evaluation for possible epidural abscess. History of IVDU, endocarditis. EXAM: MRI CERVICAL SPINE WITHOUT CONTRAST TECHNIQUE: Multiplanar, multisequence MR imaging of the cervical spine was performed. No intravenous contrast was administered. COMPARISON:  None available. FINDINGS: Alignment: Examination limited as the patient was unable to tolerate the full length of the exam. No post-contrast sequences were performed. Additionally, imaging of the thorax was ordered but not performed due to patient's inability to continue. Vertebral bodies normally aligned with preservation of the  normal cervical lordosis. No listhesis. Vertebrae: Vertebral body heights maintained without evidence for acute or chronic fracture. Bone marrow signal intensity diffusely decreased on T1 weighted imaging, most commonly related to anemia, smoking, obesity, or other chronic disease. No discrete or worrisome osseous lesions. No abnormal marrow edema to suggest acute osteomyelitis. Cord: Signal intensity within the cervical spinal cord is normal. No discernible epidural collections identified on this noncontrast examination. Posterior Fossa, vertebral arteries, paraspinal tissues: Visualized brain and posterior fossa within normal limits. Scattered mucosal thickening within the left maxillary sinus. There is abnormal prevertebral edema/effusion normal intravascular flow voids present within the vertebral arteries bilaterally. Adjacent longus coli musculature relatively normal in appearance. No other significant soft tissue edema or inflammatory changes within the visualized neck. Mildly prominent bilateral level 2 lymph nodes measure to the upper limits of normal at approximately  1 cm in short axis. Extending from C1 through C5, nonspecific, but could reflect sequelae of acute infection Disc levels: No significant disc pathology seen within the cervical spine. No findings to suggest acute discitis. No disc bulge or disc protrusion. No significant canal or foraminal stenosis. IMPRESSION: 1. Limited study due to patient's inability to tolerate the full length of the exam. Noncontrast imaging of the cervical spinal E was performed. 2. Abnormal prevertebral edema extending from C1-2 inferiorly through C5. While this finding is nonspecific, this is most suspicious for possible infection given provided history. Source not entirely clear, as there are no other imaging findings to suggest osteomyelitis discitis on this exam. No discernible epidural abscess or other intraspinal infection. Further evaluation with dedicated neck CT may be helpful for further evaluation as clinically warranted. Electronically Signed   By: Jeannine Boga M.D.   On: 09/20/2017 20:06   Mr Thoracic Spine W Contrast  Result Date: 09/20/2017 CLINICAL DATA:  Initial evaluation for possible epidural abscess. History of IVDU, endocarditis. EXAM: MRI CERVICAL SPINE WITHOUT CONTRAST TECHNIQUE: Multiplanar, multisequence MR imaging of the cervical spine was performed. No intravenous contrast was administered. COMPARISON:  None available. FINDINGS: Alignment: Examination limited as the patient was unable to tolerate the full length of the exam. No post-contrast sequences were performed. Additionally, imaging of the thorax was ordered but not performed due to patient's inability to continue. Vertebral bodies normally aligned with preservation of the normal cervical lordosis. No listhesis. Vertebrae: Vertebral body heights maintained without evidence for acute or chronic fracture. Bone marrow signal intensity diffusely decreased on T1 weighted imaging, most commonly related to anemia, smoking, obesity, or other chronic  disease. No discrete or worrisome osseous lesions. No abnormal marrow edema to suggest acute osteomyelitis. Cord: Signal intensity within the cervical spinal cord is normal. No discernible epidural collections identified on this noncontrast examination. Posterior Fossa, vertebral arteries, paraspinal tissues: Visualized brain and posterior fossa within normal limits. Scattered mucosal thickening within the left maxillary sinus. There is abnormal prevertebral edema/effusion normal intravascular flow voids present within the vertebral arteries bilaterally. Adjacent longus coli musculature relatively normal in appearance. No other significant soft tissue edema or inflammatory changes within the visualized neck. Mildly prominent bilateral level 2 lymph nodes measure to the upper limits of normal at approximately 1 cm in short axis. Extending from C1 through C5, nonspecific, but could reflect sequelae of acute infection Disc levels: No significant disc pathology seen within the cervical spine. No findings to suggest acute discitis. No disc bulge or disc protrusion. No significant canal or foraminal stenosis. IMPRESSION: 1. Limited study due to patient's inability to tolerate the full length  of the exam. Noncontrast imaging of the cervical spinal E was performed. 2. Abnormal prevertebral edema extending from C1-2 inferiorly through C5. While this finding is nonspecific, this is most suspicious for possible infection given provided history. Source not entirely clear, as there are no other imaging findings to suggest osteomyelitis discitis on this exam. No discernible epidural abscess or other intraspinal infection. Further evaluation with dedicated neck CT may be helpful for further evaluation as clinically warranted. Electronically Signed   By: Jeannine Boga M.D.   On: 09/20/2017 20:06     Assessment/Plan: Prevertebral edema C1-5 MSSA bacteremia Previous MSSA and Strep TV IE Hepatitis C Opioid use  d/o withdrawing   Total days of antibiotics: 2 ancef  She is now IVC I spoke with RN- she has gotten ativan this PM, suboxone and her anbx.  Please continue cefazolin My great appreciatin to Dr Evette Doffing in helping with her suboxone treatment.          Bobby Rumpf MD, FACP Infectious Diseases (pager) [phone] [email or web address] 09/22/2017, 4:54 PM  LOS: 0 days

## 2017-09-22 NOTE — ED Notes (Signed)
Pt states "I can't take the suboxone yet, it hasn't been 24 hours since I last used, I last used around 10 or later last night". Will notify MD.

## 2017-09-22 NOTE — ED Provider Notes (Signed)
[place] EMERGENCY DEPARTMENT Provider Note   CSN: [id number] Arrival date & time: 09/22/17  0434     History   Chief Complaint Chief Complaint  Patient presents with  . Neck Pain    HPI Sherri Francis is a 32 y.o. female.  The history is provided by the patient and medical records.    32 y.o. F with hx of anemia, anxiety, depression, hx of endocarditis, hep C, hx of ongoing IVDU, newly diagnosed osteomyelitis of cervical spine on MRI 09/20/17, presenting to the ED for neck pain.  Patient was admitted on 09/20/17 and left AMA yesterday afternoon around 1445.  States she was withdrawing too bad and didn't know what to do so she left to go use heroin.  States she used a few times yesterday, last use was last night.  States today she has been in excruciating pain, mostly in her neck.  No new numbness/weakness.  She denies chest pain or SOB.  Past Medical History:  Diagnosis Date  . Anemia   . Anxiety   . Depression   . Endocarditis   . Hepatitis C   . IVDU (intravenous drug user)   . PID (acute pelvic inflammatory disease)   . Suicide attempt [facility])    multiple attempts  . Teratoma   . UTI (urinary tract infection)     Patient Active Problem List   Diagnosis Date Noted  . Acute osteomyelitis of cervical spine ([place]) 09/20/2017  . Endocarditis 02/25/2017  . Opioid abuse with opioid-induced mood disorder ([place]) 02/21/2017  . Fever 02/20/2017  . Anemia 02/20/2017  . Acute bacterial endocarditis   . Substance induced mood disorder ([place]) 01/30/2017  . Opioid type dependence, continuous ([place]) 01/30/2017  . 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 Bacteremia due to Staphylococcus aureus 01/27/2017  . Pneumonia 01/24/2017  . Pelvic abscess in female 01/13/2016  . PID (acute pelvic inflammatory disease) 01/13/2016    Past Surgical History:  Procedure Laterality Date  . LAPAROSCOPY        OB History    Gravida  0   Para  0   Term  0   Preterm  0   AB  0   Living  0     SAB  0   TAB  0   Ectopic  0   Multiple  0   Live Births  0            Home Medications    Prior to Admission medications   Not on File    Family History Family History  Problem Relation Age of Onset  . Heart disease Maternal Grandfather   . Anxiety disorder Maternal Grandfather   . Heart disease Maternal Aunt   . Alcohol abuse Father   . Drug abuse Father   . Cancer Neg Hx     Social History Social History   Tobacco Use  . Smoking status: Current Every Day Smoker    Packs/day: 0.50    Types: Cigarettes  . Smokeless tobacco: Never Used  Substance Use Topics  . Alcohol use: Yes    Comment: 6 beers perday- 2 years ago was last time hx of alcoholism per pt.  . Drug use: Yes    Types: IV    Comment: heroin, last 2 days prior to admission     Allergies   Patient has no known allergies.   Review of Systems Review of  Systems  Musculoskeletal: Positive for neck pain.  All other systems reviewed and are negative.    Physical Exam Updated Vital Signs Temp 98.5 F (36.9 C) (Oral)   Ht 5\' 1"  (1.549 m)   Wt 46.3 kg (102 lb)   LMP 03/08/2017 (Approximate)   SpO2 99%   BMI 19.27 kg/m   Physical Exam  Constitutional: She is oriented to person, place, and time. She appears well-developed and well-nourished.  Disheveled, thin  HENT:  Head: Normocephalic and atraumatic.  Mouth/Throat: Oropharynx is clear and moist.  Poor dentition  Eyes: Pupils are equal, round, and reactive to light. Conjunctivae and EOM are normal.  Neck: Normal range of motion.  Cardiovascular: Regular rhythm and normal heart sounds. Tachycardia present.  Tachycardic 115 during exam, crying in room  Pulmonary/Chest: Effort normal and breath sounds normal. No stridor. No respiratory distress.  Abdominal: Soft. Bowel sounds are normal. There is no tenderness. There is no rebound.    Musculoskeletal: Normal range of motion.  Track marks in bilateral AC's, wounds and sores on all of her extremities  Neurological: She is alert and oriented to person, place, and time.  Skin: Skin is warm and dry.  Psychiatric: Her mood appears anxious.  Anxious, tearful  Nursing note and vitals reviewed.    ED Treatments / Results  Labs (all labs ordered are listed, but only abnormal results are displayed) Labs Reviewed  CBC WITH DIFFERENTIAL/PLATELET - Abnormal; Notable for the following components:      Result Value   WBC 12.0 (*)    Hemoglobin 10.5 (*)    HCT 33.9 (*)    MCV 77.0 (*)    MCH 23.9 (*)    Platelets 520 (*)    Neutro Abs 8.3 (*)    All other components within normal limits  BASIC METABOLIC PANEL - Abnormal; Notable for the following components:   Potassium 3.2 (*)    CO2 21 (*)    Glucose, Bld 120 (*)    BUN 5 (*)    Creatinine, Ser 0.42 (*)    Calcium 8.6 (*)    All other components within normal limits    EKG None  Radiology Dg Chest 2 View  Result Date: 09/20/2017 CLINICAL DATA:  Fever and neck stiffness for 2 days. EXAM: CHEST - 2 VIEW COMPARISON:  PA and lateral chest 02/24/2017 and 01/24/2017. CT chest 01/27/2017. FINDINGS: The lungs are clear. Cavitary lesions in the right chest seen on prior examinations are not visualized today. No pneumothorax or pleural fluid. Heart size is normal. No bony abnormality. IMPRESSION: Negative chest. Electronically Signed   By: Inge Rise M.D.   On: 09/20/2017 09:23   Mr Cervical Spine Wo Contrast  Result Date: 09/20/2017 CLINICAL DATA:  Initial evaluation for possible epidural abscess. History of IVDU, endocarditis. EXAM: MRI CERVICAL SPINE WITHOUT CONTRAST TECHNIQUE: Multiplanar, multisequence MR imaging of the cervical spine was performed. No intravenous contrast was administered. COMPARISON:  None available. FINDINGS: Alignment: Examination limited as the patient was unable to tolerate the full length of  the exam. No post-contrast sequences were performed. Additionally, imaging of the thorax was ordered but not performed due to patient's inability to continue. Vertebral bodies normally aligned with preservation of the normal cervical lordosis. No listhesis. Vertebrae: Vertebral body heights maintained without evidence for acute or chronic fracture. Bone marrow signal intensity diffusely decreased on T1 weighted imaging, most commonly related to anemia, smoking, obesity, or other chronic disease. No discrete or worrisome osseous lesions. No abnormal  marrow edema to suggest acute osteomyelitis. Cord: Signal intensity within the cervical spinal cord is normal. No discernible epidural collections identified on this noncontrast examination. Posterior Fossa, vertebral arteries, paraspinal tissues: Visualized brain and posterior fossa within normal limits. Scattered mucosal thickening within the left maxillary sinus. There is abnormal prevertebral edema/effusion normal intravascular flow voids present within the vertebral arteries bilaterally. Adjacent longus coli musculature relatively normal in appearance. No other significant soft tissue edema or inflammatory changes within the visualized neck. Mildly prominent bilateral level 2 lymph nodes measure to the upper limits of normal at approximately 1 cm in short axis. Extending from C1 through C5, nonspecific, but could reflect sequelae of acute infection Disc levels: No significant disc pathology seen within the cervical spine. No findings to suggest acute discitis. No disc bulge or disc protrusion. No significant canal or foraminal stenosis. IMPRESSION: 1. Limited study due to patient's inability to tolerate the full length of the exam. Noncontrast imaging of the cervical spinal E was performed. 2. Abnormal prevertebral edema extending from C1-2 inferiorly through C5. While this finding is nonspecific, this is most suspicious for possible infection given provided history.  Source not entirely clear, as there are no other imaging findings to suggest osteomyelitis discitis on this exam. No discernible epidural abscess or other intraspinal infection. Further evaluation with dedicated neck CT may be helpful for further evaluation as clinically warranted. Electronically Signed   By: Jeannine Boga M.D.   On: 09/20/2017 20:06   Mr Thoracic Spine W Contrast  Result Date: 09/20/2017 CLINICAL DATA:  Initial evaluation for possible epidural abscess. History of IVDU, endocarditis. EXAM: MRI CERVICAL SPINE WITHOUT CONTRAST TECHNIQUE: Multiplanar, multisequence MR imaging of the cervical spine was performed. No intravenous contrast was administered. COMPARISON:  None available. FINDINGS: Alignment: Examination limited as the patient was unable to tolerate the full length of the exam. No post-contrast sequences were performed. Additionally, imaging of the thorax was ordered but not performed due to patient's inability to continue. Vertebral bodies normally aligned with preservation of the normal cervical lordosis. No listhesis. Vertebrae: Vertebral body heights maintained without evidence for acute or chronic fracture. Bone marrow signal intensity diffusely decreased on T1 weighted imaging, most commonly related to anemia, smoking, obesity, or other chronic disease. No discrete or worrisome osseous lesions. No abnormal marrow edema to suggest acute osteomyelitis. Cord: Signal intensity within the cervical spinal cord is normal. No discernible epidural collections identified on this noncontrast examination. Posterior Fossa, vertebral arteries, paraspinal tissues: Visualized brain and posterior fossa within normal limits. Scattered mucosal thickening within the left maxillary sinus. There is abnormal prevertebral edema/effusion normal intravascular flow voids present within the vertebral arteries bilaterally. Adjacent longus coli musculature relatively normal in appearance. No other  significant soft tissue edema or inflammatory changes within the visualized neck. Mildly prominent bilateral level 2 lymph nodes measure to the upper limits of normal at approximately 1 cm in short axis. Extending from C1 through C5, nonspecific, but could reflect sequelae of acute infection Disc levels: No significant disc pathology seen within the cervical spine. No findings to suggest acute discitis. No disc bulge or disc protrusion. No significant canal or foraminal stenosis. IMPRESSION: 1. Limited study due to patient's inability to tolerate the full length of the exam. Noncontrast imaging of the cervical spinal E was performed. 2. Abnormal prevertebral edema extending from C1-2 inferiorly through C5. While this finding is nonspecific, this is most suspicious for possible infection given provided history. Source not entirely clear, as there are no other  imaging findings to suggest osteomyelitis discitis on this exam. No discernible epidural abscess or other intraspinal infection. Further evaluation with dedicated neck CT may be helpful for further evaluation as clinically warranted. Electronically Signed   By: Jeannine Boga M.D.   On: 09/20/2017 20:06    Procedures Procedures (including critical care time)  Medications Ordered in ED Medications  LORazepam (ATIVAN) injection 1 mg (has no administration in time range)  sodium chloride 0.9 % bolus 1,000 mL (has no administration in time range)  ceFAZolin (ANCEF) IVPB 2g/100 mL premix (has no administration in time range)     Initial Impression / Assessment and Plan / ED Course  I have reviewed the triage vital signs and the nursing notes.  Pertinent labs & imaging results that were available during my care of the patient were reviewed by me and considered in my medical decision making (see chart for details).  32 year old female presenting to the ED with neck pain.  She was seen in the ED 2 days ago and diagnosed with osteomyelitis of the  cervical spine.  She also has partially treated MSSA endocarditis.  Continues to be IV drug user, left hospital AMA yesterday because she needed to "get high".  She returns today due to worsening pain in her neck.  She is requesting Ativan.  She is extremely tearful and anxious on exam.  Blood cultures grew out MSSA, ID has recommended Ancef.  Will resume this.  Basic labs collected.  Will give IV fluids and dose of Ativan.  She will be placed in CIWA protocol.    I had a very long discussion with patient that if she is not willing to stay in the hospital for adequate treatment, there is no point in readmitting her for her to leave again later today.  I discussed with her how ill she currently is and how important it is to have adequate treatment.  She states she understands this and is adamant that she will stay for admission.  Patient has notoriously left AMA multiple times previously.  Discussed with Dr. Alcario Drought-- he is familiar with patient from admission 2 days ago.  Morning team to see and evaluate for admission.  Likely will need step-down for CIWA monitoring and management of withdrawal symptoms.  Final Clinical Impressions(s) / ED Diagnoses   Final diagnoses:  Osteomyelitis of cervical spine [facility])  IV drug user    ED Discharge Orders    None       Larene Pickett, PA-C 09/22/17 [id number]    Merryl Hacker, MD 09/22/17 [phone]

## 2017-09-22 NOTE — ED Notes (Signed)
[employer] and security at bedside so this RN can give medication.

## 2017-09-22 NOTE — ED Notes (Addendum)
Page sent to Dr. Lorin Mercy Pts mother demanding ativan for pt.

## 2017-09-22 NOTE — ED Notes (Addendum)
Attempting to page Dr. Lorin Mercy to let her know that pt is wanting to leave.

## 2017-09-22 NOTE — ED Notes (Signed)
Sitter at bedside.

## 2017-09-22 NOTE — Progress Notes (Addendum)
Patient's mother is "demanding" Ativan for the patient.  Will give a one-time dose of Ativan at this time.  Will consider IVC given the circumstances.  Will consider Suboxone consult from Dr. Evette Doffing.  Addendum:  I spoke to Gust Rung from [facility].  She recommends Neurontin 300 mg TID for withdrawal and anxiety.  The patient is likely to ask for more Ativan and so can increase to QID in the future in lieu of Aitvan.  She does recommend IVC as a threat to self.  Will initiate IVC paperwork.  Dr. Evette Doffing is also willing to consult on the patient.  Karmen Bongo, M.D.

## 2017-09-22 NOTE — ED Notes (Signed)
Dr. Lorin Mercy at bedside with mother.

## 2017-09-22 NOTE — ED Notes (Signed)
Page sent to Dr. Lorin Mercy

## 2017-09-22 NOTE — ED Notes (Signed)
Dr. Lorin Mercy returned page.

## 2017-09-22 NOTE — ED Notes (Addendum)
Pt denies SI/HI at this time, pt also denies hallucinations at this time. Pt denies drinking alcohol, states that she only does heroine. Pt updated about plan of care.

## 2017-09-22 NOTE — ED Notes (Addendum)
Pt changed back into gown. Pts clothing removed from room. Clothing at [place]. Pts mother has phone and phone is dead.

## 2017-09-22 NOTE — ED Notes (Signed)
Lunch Tray ([employer]) Ordered @ 1132-per RN-called by Levada Dy

## 2017-09-22 NOTE — ED Notes (Signed)
Pt dressed and walking in hallway as if she is trying to leave, re-directed back to room by mother. Security and [employer] called.

## 2017-09-23 DIAGNOSIS — R450 Nervousness: Secondary | ICD-10-CM

## 2017-09-23 DIAGNOSIS — Z813 Family history of other psychoactive substance abuse and dependence: Secondary | ICD-10-CM

## 2017-09-23 DIAGNOSIS — Z818 Family history of other mental and behavioral disorders: Secondary | ICD-10-CM

## 2017-09-23 DIAGNOSIS — Z811 Family history of alcohol abuse and dependence: Secondary | ICD-10-CM

## 2017-09-23 DIAGNOSIS — F32A Depression, unspecified: Secondary | ICD-10-CM

## 2017-09-23 DIAGNOSIS — F329 Major depressive disorder, single episode, unspecified: Secondary | ICD-10-CM

## 2017-09-23 DIAGNOSIS — F419 Anxiety disorder, unspecified: Secondary | ICD-10-CM

## 2017-09-23 DIAGNOSIS — F17210 Nicotine dependence, cigarettes, uncomplicated: Secondary | ICD-10-CM

## 2017-09-23 LAB — BASIC METABOLIC PANEL
ANION GAP: 9 (ref 5–15)
BUN: 8 mg/dL (ref 6–20)
CALCIUM: 8.6 mg/dL — AB (ref 8.9–10.3)
CO2: 22 mmol/L (ref 22–32)
Chloride: 107 mmol/L (ref 101–111)
Creatinine, Ser: 0.41 mg/dL — ABNORMAL LOW (ref 0.44–1.00)
GFR calc Af Amer: >60 mL/min (ref >60)
GLUCOSE: 117 mg/dL — AB (ref 65–99)
POTASSIUM: 3.9 mmol/L (ref 3.5–5.1)
Sodium: 138 mmol/L (ref 135–145)

## 2017-09-23 LAB — CBC
HEMATOCRIT: 32 % — AB (ref 36.0–46.0)
HEMOGLOBIN: 9.7 g/dL — AB (ref 12.0–15.0)
MCH: 23.4 pg — AB (ref 26.0–34.0)
MCHC: 30.3 g/dL (ref 30.0–36.0)
MCV: 77.1 fL — ABNORMAL LOW (ref 78.0–100.0)
Platelets: 555 10*3/uL — ABNORMAL HIGH (ref 150–400)
RBC: 4.15 MIL/uL (ref 3.87–5.11)
RDW: 15 % (ref 11.5–15.5)
WBC: 7 10*3/uL (ref 4.0–10.5)

## 2017-09-23 LAB — CULTURE, BLOOD (ROUTINE X 2): SPECIAL REQUESTS: ADEQUATE

## 2017-09-23 LAB — MRSA PCR SCREENING: MRSA by PCR: NEGATIVE

## 2017-09-23 MED ORDER — LORAZEPAM 2 MG/ML IJ SOLN
2.0000 mg | Freq: Once | INTRAMUSCULAR | Status: AC
  Administered 2017-09-23: 2 mg via INTRAVENOUS
  Filled 2017-09-23: qty 1

## 2017-09-23 MED ORDER — LORAZEPAM BOLUS VIA INFUSION: 1.0000 mg | INTRAVENOUS | Status: DC | PRN

## 2017-09-23 MED ORDER — LORAZEPAM 2 MG/ML IJ SOLN
1.0000 mg | Freq: Once | INTRAMUSCULAR | Status: AC
Start: 2017-09-23 — End: 2017-09-23
  Filled 2017-09-23: qty 1

## 2017-09-23 MED ORDER — SODIUM CHLORIDE 0.9 % IV SOLN
INTRAVENOUS | Status: DC
  Administered 2017-09-24: 21:00:00 via INTRAVENOUS

## 2017-09-23 MED ORDER — LORAZEPAM 2 MG/ML IJ SOLN
1.0000 mg | INTRAMUSCULAR | Status: DC | PRN
  Administered 2017-09-23 – 2017-09-24 (×6): 1 mg via INTRAVENOUS
  Filled 2017-09-23 (×5): qty 1

## 2017-09-23 NOTE — Progress Notes (Signed)
CSW spoke with pot at bedside. Pt asked that CSW provide pt with the information to get clean needles in the [facility] areas. Pt reports that pt has no plans to stop heroin use but does report that pt wants to be safe while using. RN informed CSW that pt is to be seen by psych and then further needs will be determined. CSW will continue to follow for any further needs.     Sherri Francis, MSW, [place] [facility] Clinical Social Worker [phone]

## 2017-09-23 NOTE — Progress Notes (Signed)
    [facility] has been requested to perform a transesophageal echocardiogram on 09/27/17 for evaluation for endocarditis.  After careful review of history and examination, the risks and benefits of transesophageal echocardiogram have been explained including risks of esophageal damage, perforation (1:10,000 risk), bleeding, pharyngeal hematoma as well as other potential complications associated with conscious sedation including aspiration, arrhythmia, respiratory failure and death. Alternatives to treatment were discussed, questions were answered. Patient is willing to proceed.   TEE scheduled for 09/27/17 at 8:00am with Dr. Britta Mccreedy, PA-C 09/23/2017 2:20 PM

## 2017-09-23 NOTE — Plan of Care (Signed)
Pt has had several episodes of increased anxiety and threatening to leave today.  She becomes very agitated and yells.  In between,she is intermittently calm and sleeping. Sitter remains at bedside for suicide precautions.

## 2017-09-23 NOTE — Consult Note (Addendum)
[facility] Consult   Reason for Consult:  SI and capacity evaluation  Referring Physician:  Dr. Marthenia Rolling  Patient Identification: Sherri Francis MRN:  [id number] Principal Diagnosis: Anxiety and depression Diagnosis:   Patient Active Problem List   Diagnosis Date Noted  . Osteomyelitis ([place]) [M86.9] 09/22/2017  . IV drug user [F19.90]   . Acute osteomyelitis of cervical spine (HCC) [M46.22] 09/20/2017  . Opioid abuse with opioid-induced mood disorder ([place]) [F11.14] 02/21/2017  . Fever [R50.9] 02/20/2017  . Anemia [D64.9] 02/20/2017  . 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 Substance induced mood disorder ([place]) [F19.94] 01/30/2017  . Opioid type dependence, continuous ([place]) [F11.20] 01/30/2017  . 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 Bacteremia due to Staphylococcus aureus [R78.81] 01/27/2017  . Pneumonia [J18.9] 01/24/2017  . Pelvic abscess in female [N73.9] 01/13/2016  . PID (acute pelvic inflammatory disease) [N73.0] 01/13/2016    Total Time spent with patient: 1 hour  Subjective:   Sherri Francis is a 32 y.o. female patient admitted with acute osteomyelitis of cervical spine.  HPI:   Per chart review, Sherri Francis was admitted with acute osteomyelitis of cervical spine due to MSSA bacteremia. She has a history of significant IV heroin use. She was started on Suboxone in the hospital for opioid withdrawal. She is also receiving Klonopin 0.5 mg BID PRN. Mother reports concerns that she is suicidal. Of note, she was last seen by the psychiatry consult service on 02/21/17 for heroin withdrawal and was referred to a Suboxone treatment center.   On interview, Sherri Francis reports coming to the hospital due to having an infection in her neck. She is able to state her medication condition as well as the risks versus benefits of treatment. She wants to receive treatment due to  risk of death but she is frustrated since she voluntarily came to the hospital and is now under an IVC. She reports developing withdrawal symptoms from heroin since being hospitalized.  She reports that her withdrawal symptoms (diaphoresis) are mild today due to receiving Suboxone.  She denies SI, HI or AVH.  She reports that she stated to her mom, "just let me go" and her mom interpreted this as her being suicidal.  She reports that she just meant that she wanted her mother to leave her alone.  She reports a history of anxiety and depression.  She feels like her anxiety is the worst of the two.  She worries about "everything."  She reports that her mother is a stressor.  She reports hypersomnia.  She sleeps up to 10-12 hours daily.  She denies any history of manic symptoms.  Patient's mother, Sherri Francis was spoken to at bedside with patient's permission. She reports frustration that her daughter has a life threatening condition but still desires discharge. She reports concerns for her safety. She endorsed SI to her mother today. She told her that she did not want to be here and recently sent her a text message that stated the flowers she wanted at her funeral. She has been depressed and tearful.   Past Psychiatric History: Opiate use disorder, anxiety and depression.  Risk to Self: Yes per mother's collateral.  Risk to Others:  None. Denies HI.  Prior Inpatient Therapy:  She was hospitalized at 32 y/o for suicide attempt by overdosing on sleeping pills after her father was murdered.  Prior Outpatient Therapy:  Prior medications include Prozac 10 mg daily  and Gabapentin 100 mg TID PRN.   Past Medical History:  Past Medical History:  Diagnosis Date  . Anemia   . Anxiety   . Depression   . Endocarditis   . Hepatitis C   . IVDU (intravenous drug user)   . PID (acute pelvic inflammatory disease)   . Suicide attempt [facility])    multiple attempts  . Teratoma   . UTI (urinary tract infection)     Past  Surgical History:  Procedure Laterality Date  . LAPAROSCOPY     Family History:  Family History  Problem Relation Age of Onset  . Heart disease Maternal Grandfather   . Anxiety disorder Maternal Grandfather   . Heart disease Maternal Aunt   . Alcohol abuse Father   . Drug abuse Father   . Cancer Neg Hx    Family Psychiatric  History: As listed above.  Social History:  Social History   Substance and Sexual Activity  Alcohol Use Yes   Comment: 6 beers perday- 2 years ago was last time hx of alcoholism per pt.     Social History   Substance and Sexual Activity  Drug Use Yes  . Types: IV   Comment: heroin, last 2 days prior to admission    Social History   Socioeconomic History  . Marital status: Single    Spouse name: Not on file  . Number of children: Not on file  . Years of education: Not on file  . Highest education level: Not on file  Occupational History  . Not on file  Social Needs  . Financial resource strain: Not on file  . Food insecurity:    Worry: Not on file    Inability: Not on file  . Transportation needs:    Medical: Not on file    Non-medical: Not on file  Tobacco Use  . Smoking status: Current Every Day Smoker    Packs/day: 0.50    Types: Cigarettes  . Smokeless tobacco: Never Used  Substance and Sexual Activity  . Alcohol use: Yes    Comment: 6 beers perday- 2 years ago was last time hx of alcoholism per pt.  . Drug use: Yes    Types: IV    Comment: heroin, last 2 days prior to admission  . Sexual activity: Not Currently  Lifestyle  . Physical activity:    Days per week: Not on file    Minutes per session: Not on file  . Stress: Not on file  Relationships  . Social connections:    Talks on phone: Not on file    Gets together: Not on file    Attends religious service: Not on file    Active member of club or organization: Not on file    Attends meetings of clubs or organizations: Not on file    Relationship status: Not on file   Other Topics Concern  . Not on file  Social History Narrative  . Not on file   Additional Social History: She is temporarily living at an Extended Stay hotel. She plans to move into a town home with one roommate. She has a history of IV heroin use. She uses up to 3 grams daily. She last completed rehab 4 years ago and maintained sobriety for 1 year. She denies other illicit substance use or marijuana use.     Allergies:  No Known Allergies  Labs:  Results for orders placed or performed during the hospital encounter of 09/22/17 (from the past  48 hour(s))  CBC with Differential     Status: Abnormal   Collection Time: 09/22/17  5:36 AM  Result Value Ref Range   WBC 12.0 (H) 4.0 - 10.5 K/uL   RBC 4.40 3.87 - 5.11 MIL/uL   Hemoglobin 10.5 (L) 12.0 - 15.0 g/dL   HCT 33.9 (L) 36.0 - 46.0 %   MCV 77.0 (L) 78.0 - 100.0 fL   MCH 23.9 (L) 26.0 - 34.0 pg   MCHC 31.0 30.0 - 36.0 g/dL   RDW 15.0 11.5 - 15.5 %   Platelets 520 (H) 150 - 400 K/uL   Neutrophils Relative % 70 %   Neutro Abs 8.3 (H) 1.7 - 7.7 K/uL   Lymphocytes Relative 23 %   Lymphs Abs 2.7 0.7 - 4.0 K/uL   Monocytes Relative 6 %   Monocytes Absolute 0.8 0.1 - 1.0 K/uL   Eosinophils Relative 1 %   Eosinophils Absolute 0.1 0.0 - 0.7 K/uL   Basophils Relative 0 %   Basophils Absolute 0.0 0.0 - 0.1 K/uL    Comment: Performed at [facility], [address], [place], [place] [address]  Basic metabolic panel     Status: Abnormal   Collection Time: 09/22/17  5:36 AM  Result Value Ref Range   Sodium 136 135 - 145 mmol/L   Potassium 3.2 (L) 3.5 - 5.1 mmol/L   Chloride 106 101 - 111 mmol/L   CO2 21 (L) 22 - 32 mmol/L   Glucose, Bld 120 (H) 65 - 99 mg/dL   BUN 5 (L) 6 - 20 mg/dL   Creatinine, Ser 0.42 (L) 0.44 - 1.00 mg/dL   Calcium 8.6 (L) 8.9 - 10.3 mg/dL   GFR calc non Af Amer >60 >60 mL/min   GFR calc Af Amer >60 >60 mL/min    Comment: (NOTE) The eGFR has been calculated using the CKD EPI equation. This calculation  has not been validated in all clinical situations. eGFR's persistently <60 mL/min signify possible Chronic Kidney Disease.    Anion gap 9 5 - 15    Comment: Performed at [facility] [address] [place], [place] [address]  MRSA PCR Screening     Status: None   Collection Time: 09/23/17  4:28 AM  Result Value Ref Range   MRSA by PCR NEGATIVE NEGATIVE    Comment:        The GeneXpert MRSA Assay (FDA approved for NASAL specimens only), is one component of a comprehensive MRSA colonization surveillance program. It is not intended to diagnose MRSA infection nor to guide or monitor treatment for MRSA infections. Performed at [facility], [place] [address] [place], [place] [address]   Basic metabolic panel     Status: Abnormal   Collection Time: 09/23/17  5:47 AM  Result Value Ref Range   Sodium 138 135 - 145 mmol/L   Potassium 3.9 3.5 - 5.1 mmol/L    Comment: NO VISIBLE HEMOLYSIS   Chloride 107 101 - 111 mmol/L   CO2 22 22 - 32 mmol/L   Glucose, Bld 117 (H) 65 - 99 mg/dL   BUN 8 6 - 20 mg/dL   Creatinine, Ser 0.41 (L) 0.44 - 1.00 mg/dL   Calcium 8.6 (L) 8.9 - 10.3 mg/dL   GFR calc non Af Amer >60 >60 mL/min   GFR calc Af Amer >60 >60 mL/min    Comment: (NOTE) The eGFR has been calculated using the CKD EPI equation. This calculation has  not been validated in all clinical situations. eGFR's persistently <60 mL/min signify possible Chronic Kidney Disease.    Anion gap 9 5 - 15    Comment: Performed at [facility] [address], [place], [place] [address]  CBC     Status: Abnormal   Collection Time: 09/23/17  5:47 AM  Result Value Ref Range   WBC 7.0 4.0 - 10.5 K/uL   RBC 4.15 3.87 - 5.11 MIL/uL   Hemoglobin 9.7 (L) 12.0 - 15.0 g/dL   HCT 32.0 (L) 36.0 - 46.0 %   MCV 77.1 (L) 78.0 - 100.0 fL   MCH 23.4 (L) 26.0 - 34.0 pg   MCHC 30.3 30.0 - 36.0 g/dL   RDW 15.0 11.5 - 15.5 %   Platelets 555 (H) 150 - 400 K/uL    Comment: Performed at [facility], [place] [address], [place], [place] [address]    Current Facility-Administered Medications  Medication Dose Route Frequency Provider Last Rate Last Dose  . 0.9 %  sodium chloride infusion   Intravenous Continuous Karmen Bongo, MD      . acetaminophen (TYLENOL) tablet 650 mg  650 mg Oral Q6H PRN Karmen Bongo, MD   650 mg at 09/22/17 2344   Or  . acetaminophen (TYLENOL) suppository 650 mg  650 mg Rectal Q6H PRN Karmen Bongo, MD      . buprenorphine-naloxone (SUBOXONE) 8-2 mg per SL tablet 1 tablet  1 tablet Sublingual BID Karmen Bongo, MD   1 tablet at 09/23/17 0809  . ceFAZolin (ANCEF) IVPB 2g/100 mL premix  2 g Intravenous Lynne Logan, MD   Stopped at 09/23/17 [phone]  . dicyclomine (BENTYL) tablet 20 mg  20 mg Oral Q6H PRN Karmen Bongo, MD   20 mg at 09/22/17 2345  . enoxaparin (LOVENOX) injection 40 mg  40 mg Subcutaneous Q24H Karmen Bongo, MD      . loperamide (IMODIUM) capsule 2-4 mg  2-4 mg Oral PRN Karmen Bongo, MD      . LORazepam (ATIVAN) injection 1 mg  1 mg Intravenous Q4H PRN Dana Allan I, MD      . methocarbamol (ROBAXIN) tablet 500 mg  500 mg Oral Q8H PRN Karmen Bongo, MD   500 mg at 09/22/17 1935  . naproxen (NAPROSYN) tablet 500 mg  500 mg Oral BID PRN Karmen Bongo, MD   500 mg at 09/22/17 2345  . ondansetron (ZOFRAN) tablet 4 mg  4 mg Oral Q6H PRN Karmen Bongo, MD       Or  . ondansetron [facility]) injection 4 mg  4 mg Intravenous Q6H PRN Karmen Bongo, MD      . sodium chloride flush (NS) 0.9 % injection 3 mL  3 mL Intravenous Q12H Karmen Bongo, MD   3 mL at 09/22/17 2240    Musculoskeletal: Strength & Muscle Tone: within normal limits Gait & Station: UTA since patient was lying in bed. Patient leans: N/A  Psychiatric Specialty Exam: Physical Exam  Nursing note and vitals reviewed. Constitutional: She is oriented to person, place, and time. She appears well-developed and well-nourished.  HENT:  Head: Normocephalic  and atraumatic.  Neck: Normal range of motion.  Respiratory: Effort normal.  Musculoskeletal: Normal range of motion.  Neurological: She is alert and oriented to person, place, and time.  Skin: No rash noted.  Psychiatric: Her speech is normal and behavior is normal. Thought content normal. Her affect is labile. Cognition and memory are normal. She expresses impulsivity.  Review of Systems  Constitutional: Positive for diaphoresis. Negative for chills and fever.  Cardiovascular: Negative for chest pain.  Gastrointestinal: Negative for abdominal pain, constipation, diarrhea, nausea and vomiting.  Psychiatric/Behavioral: Positive for depression and substance abuse. Negative for hallucinations and suicidal ideas. The patient is nervous/anxious. The patient does not have insomnia.   All other systems reviewed and are negative.   Blood pressure 113/62, pulse 97, temperature 98.2 F (36.8 C), temperature source Oral, resp. rate (!) 26, height 5' 1" (1.549 m), weight 46.9 kg (103 lb 6.3 oz), last menstrual period 03/08/2017, SpO2 98 %.Body mass index is 19.54 kg/m.  General Appearance: Fairly Groomed, young, Caucasian female with long brunette hair, wearing a hospital gown and healed excoriations on her forearms. NAD.    Eye Contact:  Good  Speech:  Clear and Coherent and Normal Rate  Volume:  Normal  Mood:  "I'm not crazy."  Affect:  Labile  Thought Process:  Goal Directed, Linear and Descriptions of Associations: Intact  Orientation:  Full (Time, Place, and Person)  Thought Content:  Logical  Suicidal Thoughts:  No  Homicidal Thoughts:  No  Memory:  Immediate;   Fair Recent;   Fair Remote;   Fair  Judgement:  Fair  Insight:  Fair  Psychomotor Activity:  Normal  Concentration:  Concentration: Good and Attention Span: Good  Recall:  [employer] of Knowledge:  Fair  Language:  Good  Akathisia:  No  Handed:  Right  AIMS (if indicated):   N/A  Assets:  Housing Social Support   ADL's:  Intact  Cognition:  WNL  Sleep:   Okay   Assessment:  Sherri Francis is a 32 y.o. female who was admitted with osteomyelitis of cervical spine. Psychiatry was consulted due to mother's concern for SI. Patient denies SI although mother provides text messages where patient asked mother to purchase flowers for her funeral and also endorsed SI during current hospitalization. She has severe opiate use disorder and is currently withdrawing. She declines substance abuse treatment at this time. Recommend restarting Effexor for mood. She demonstrates capacity to refuse or accept treatment for current medical condition. She is aware of the risk versus benefits of treatment and is able to appreciate her medical condition.    Treatment Plan Summary: -Patient warrants inpatient psychiatric hospitalization given high risk of harm to self. -Continue bedside sitter.  -Restart Effexor 75 mg daily for mood and anxiety. Patient reports good effect in the past for mood and anxiety.  -Patient cannot leave the hospital since she is under IVC which was initiated on 4/17.  -Patient demonstrates capacity to refuse or accept treatment for current medical condition.  -Will sign off on patient at this time. Please consult psychiatry again as needed.     Disposition: Recommend psychiatric Inpatient admission when medically cleared.  Faythe Dingwall, DO 09/23/2017 12:02 PM

## 2017-09-23 NOTE — Progress Notes (Signed)
CSW provided pt with information on GCSTOP. Pt thanked CSW at this time. CSW will continue to follow for any further needs.    Virgie Dad Prathik Aman, MSW, [facility] Clinical Social Worker [phone]

## 2017-09-23 NOTE — Progress Notes (Signed)
Pt agitated and demanding to leave AMA. Educated that pt is IVC'd and can't leave the hospital. Pt proceeded to throw cup of water across room into the bathroom door. Pt stated to tech that she wanted to leave to do drugs. Stated that she "knows she has rights and wants to leave AMA."

## 2017-09-23 NOTE — Progress Notes (Signed)
INFECTIOUS DISEASE PROGRESS NOTE  ID: Sherri Francis is a 32 y.o. female with  Principal Problem:   Acute osteomyelitis of cervical spine ([name]) Active Problems:   Bacteremia due to Staphylococcus aureus   Opioid type dependence, continuous (HCC)   Osteomyelitis (HCC)  Subjective: "I'm withdrawing" No neck pain.   Abtx:  Anti-infectives (From admission, onward)   Start     Dose/Rate Route Frequency Ordered Stop   09/22/17 0530  ceFAZolin (ANCEF) IVPB 2g/100 mL premix     2 g 200 mL/hr over 30 Minutes Intravenous Every 8 hours 09/22/17 0505        Medications:  Scheduled: . buprenorphine-naloxone  1 tablet Sublingual BID  . enoxaparin (LOVENOX) injection  40 mg Subcutaneous Q24H  . sodium chloride flush  3 mL Intravenous Q12H    Objective: Vital signs in last 24 hours: Temp:  [97.9 F (36.6 C)-98.2 F (36.8 C)] 98.2 F (36.8 C) (04/18 0900) Pulse Rate:  [81-113] 97 (04/18 1115) Resp:  [15-27] 26 (04/18 1115) BP: (99-144)/(55-97) 113/62 (04/18 1115) SpO2:  [96 %-100 %] 98 % (04/18 1115) Weight:  [46.9 kg (103 lb 6.3 oz)] 46.9 kg (103 lb 6.3 oz) (04/18 0430)   General appearance: alert, cooperative and no distress Resp: clear to auscultation bilaterally Cardio: regular rate and rhythm GI: normal findings: bowel sounds normal and soft, non-tender  Lab Results Recent Labs    09/22/17 0536 09/23/17 0547  WBC 12.0* 7.0  HGB 10.5* 9.7*  HCT 33.9* 32.0*  NA 136 138  K 3.2* 3.9  CL 106 107  CO2 21* 22  BUN 5* 8  CREATININE 0.42* 0.41*   Liver Panel No results for input(s): PROT, ALBUMIN, AST, ALT, ALKPHOS, BILITOT, BILIDIR, IBILI in the last 72 hours. Sedimentation Rate No results for input(s): ESRSEDRATE in the last 72 hours. C-Reactive Protein Recent Labs    09/21/17 0811  CRP 5.2*    Microbiology: Recent Results (from the past 240 hour(s))  Culture, blood (routine x 2)     Status: Abnormal   Collection Time: 09/20/17  8:49 AM  Result Value Ref  Range Status   Specimen Description BLOOD LEFT HAND  Final   Special Requests   Final    BOTTLES DRAWN AEROBIC AND ANAEROBIC Blood Culture adequate volume   Culture  Setup Time   Final    GRAM POSITIVE COCCI IN BOTH AEROBIC AND ANAEROBIC BOTTLES CRITICAL VALUE NOTED.  VALUE IS CONSISTENT WITH PREVIOUSLY REPORTED AND CALLED VALUE.    Culture (A)  Final    STAPHYLOCOCCUS AUREUS SUSCEPTIBILITIES PERFORMED ON PREVIOUS CULTURE WITHIN THE LAST 5 DAYS. Performed at [facility], [place] [address], [place], [place] [address]    Report Status 09/23/2017 FINAL  Final  Culture, blood (routine x 2)     Status: Abnormal   Collection Time: 09/20/17  9:04 AM  Result Value Ref Range Status   Specimen Description BLOOD LEFT ANTECUBITAL  Final   Special Requests   Final    BOTTLES DRAWN AEROBIC AND ANAEROBIC Blood Culture adequate volume   Culture  Setup Time   Final    GRAM POSITIVE COCCI IN BOTH AEROBIC AND ANAEROBIC BOTTLES Organism ID to follow CRITICAL RESULT CALLED TO, READ BACK BY AND VERIFIED WITH: Karsten Ro [facility] 09/21/17 0056 JDW Performed at [facility], [place] [address] [place], [place] [address]    Culture STAPHYLOCOCCUS AUREUS (A)  Final   Report Status 09/22/2017 FINAL  Final   Organism ID,  Bacteria STAPHYLOCOCCUS AUREUS  Final      Susceptibility   Staphylococcus aureus - MIC*    CIPROFLOXACIN <=0.5 SENSITIVE Sensitive     ERYTHROMYCIN <=0.25 SENSITIVE Sensitive     GENTAMICIN <=0.5 SENSITIVE Sensitive     OXACILLIN 0.5 SENSITIVE Sensitive     TETRACYCLINE <=1 SENSITIVE Sensitive     VANCOMYCIN <=0.5 SENSITIVE Sensitive     TRIMETH/SULFA <=10 SENSITIVE Sensitive     CLINDAMYCIN <=0.25 SENSITIVE Sensitive     RIFAMPIN <=0.5 SENSITIVE Sensitive     Inducible Clindamycin NEGATIVE Sensitive     * STAPHYLOCOCCUS AUREUS  Blood Culture ID Panel (Reflexed)     Status: Abnormal   Collection Time: 09/20/17  9:04 AM  Result Value Ref Range Status   Enterococcus species NOT  DETECTED NOT DETECTED Final   Listeria monocytogenes NOT DETECTED NOT DETECTED Final   Staphylococcus species DETECTED (A) NOT DETECTED Final    Comment: CRITICAL RESULT CALLED TO, READ BACK BY AND VERIFIED WITH: J LEDFORD PHARMD 09/21/17 0056 JDW    Staphylococcus aureus DETECTED (A) NOT DETECTED Final    Comment: Methicillin (oxacillin) susceptible Staphylococcus aureus (MSSA). Preferred therapy is anti staphylococcal beta lactam antibiotic (Cefazolin or Nafcillin), unless clinically contraindicated. CRITICAL RESULT CALLED TO, READ BACK BY AND VERIFIED WITH: J LEDFORD [facility] 09/21/17 0056 JDW    Methicillin resistance NOT DETECTED NOT DETECTED Final   Streptococcus species NOT DETECTED NOT DETECTED Final   Streptococcus agalactiae NOT DETECTED NOT DETECTED Final   Streptococcus pneumoniae NOT DETECTED NOT DETECTED Final   Streptococcus pyogenes NOT DETECTED NOT DETECTED Final   Acinetobacter baumannii NOT DETECTED NOT DETECTED Final   Enterobacteriaceae species NOT DETECTED NOT DETECTED Final   Enterobacter cloacae complex NOT DETECTED NOT DETECTED Final   Escherichia coli NOT DETECTED NOT DETECTED Final   Klebsiella oxytoca NOT DETECTED NOT DETECTED Final   Klebsiella pneumoniae NOT DETECTED NOT DETECTED Final   Proteus species NOT DETECTED NOT DETECTED Final   Serratia marcescens NOT DETECTED NOT DETECTED Final   Haemophilus influenzae NOT DETECTED NOT DETECTED Final   Neisseria meningitidis NOT DETECTED NOT DETECTED Final   Pseudomonas aeruginosa NOT DETECTED NOT DETECTED Final   Candida albicans NOT DETECTED NOT DETECTED Final   Candida glabrata NOT DETECTED NOT DETECTED Final   Candida krusei NOT DETECTED NOT DETECTED Final   Candida parapsilosis NOT DETECTED NOT DETECTED Final   Candida tropicalis NOT DETECTED NOT DETECTED Final    Comment: Performed at [facility], [place]. [address] [place], [place] [address]  Culture, blood (single)     Status: None (Preliminary result)     Collection Time: 09/21/17  8:10 AM  Result Value Ref Range Status   Specimen Description BLOOD HAND  Final   Special Requests   Final    BOTTLES DRAWN AEROBIC ONLY Blood Culture adequate volume   Culture   Final    NO GROWTH 1 DAY Performed at [facility], [place] [address], [place], [place] [address]    Report Status PENDING  Incomplete  MRSA PCR Screening     Status: None   Collection Time: 09/23/17  4:28 AM  Result Value Ref Range Status   MRSA by PCR NEGATIVE NEGATIVE Final    Comment:        The GeneXpert MRSA Assay (FDA approved for NASAL specimens only), is one component of a comprehensive MRSA colonization surveillance program. It is not intended to diagnose MRSA infection nor to guide or monitor treatment  for MRSA infections. Performed at [facility], [place] [address], [place], [place] [address]     Studies/Results: No results found.   Assessment/Plan: Prevertebral edema C1-5 MSSA bacteremia Previous MSSA and Strep TV IE Hepatitis C Opioid use d/o Withdrawing (heroin, cocaine?) IVC  Total days of antibiotics: 3 ancef  Would continue ancef Appreciate Dr Vincent's help with suboxone She is much better, not agitated as previous.  Repeat BCx 4-16 pending Psych to eval.  Clean needles resources If she wants to leave, please give her dalbavancin prior to d/c.          Bobby Rumpf MD, FACP Infectious Diseases (pager) [phone] [email or web address] 09/23/2017, 11:48 AM  LOS: 1 day

## 2017-09-23 NOTE — Progress Notes (Signed)
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  She was admitted for acute osteomyelitis of the cervical spine from 4/15-16, when she left AMA to resume use of heroin.  During her hospitalization, she was started on Vanc and Zosyn and had 2/2 cultures positive for MSSA and so was changed to nafcillin with ID following.  She was scheduled for TEE but left before this could be performed.  Patient represents with intractable neck pain.  Last use of heroin was a day prior to admission, quantity uncertain.  She injects in her hands and arms.    Dr. Baruch Gouty is managing patient's withdrawal with Suboxone.  Will reconsult infectious disease team, Dr. Johnnye Sima.  We will also consult neurosurgery team (Dr. Christella Noa).  TEE was planned before the patient signed herself out of the hospital [place].  We will consult the cardiology team for possible TEE.  I have been made to understand that the patient is committed involuntarily.  More importantly, patient's mother tells me that there are concerns that the patient may be suicidal.  Will consult psychiatric team.  We will continue to monitor patient expectantly.  Assessment & Plan:   Principal Problem:   Acute osteomyelitis of cervical spine (HCC) Active Problems:   Bacteremia due to Staphylococcus aureus   Opioid type dependence, continuous (HCC)   Osteomyelitis (HCC)   Acute osteomyelitis of the cervical spine with MSSA bacteremia -Patient previously admitted 4/15-16 and left AMA -She returns due to intractable pain -She does report a commitment to treatment this  time -She has been resumed on Ancef, as per ID recommendations -Dr. Johnnye Sima has been notified of recurrent admission -She was scheduled for TEE tomorrow during the prior admission; this is likely needed once again -09/23/2017: Consult cardiology team for possible TEE.  Consult neurosurgery team (suggested in ID team's prior documentation) -Guarded prognosis.  Opiate dependence -Long-standing heroin dependence -She has a pattern of leaving AMA and returning to the ER -She appears to already be starting to withdraw -Will initiate suboxone therapy now; while this may precipitate early withdrawal, hopefully she can reach a level of withdrawal symptom relief that will enable her to remain hospitalized and complete treatment -I spoke with Dr. Evette Doffing for assistance in initiating Suboxone.  Will start 0.4 mg SL now and recheck her in a few hours to administer a second dose if she is tolerating well.  If her second dose also goes well, she can proceed with 8 mg SL BID starting tonight. -Will monitor on [facility] protocol -prn orders from the Clonidine withdrawal order set were also ordered. -Will admit to SDU due to the frequent need to assess her symptoms and closely monitor her  Suicidal ideations: -Patient's mother raises concern for possible suicidal ideations and ??behavior -I understand that the patient is committed voluntarily (I guess due to capacity to make medical decisions) -We will consult psychiatric team to assess patient regarding suicidal ideation and capacity to make medical decisions.  DVT prophylaxis:  Lovenox Code Status: Full  Family Communication:  Mother.  Disposition Plan:   Will depend on hospital course.  Patient may need drug rehab.  Consults called: ID.  Neurosurgery and cardiology team for possible TEE.   Procedures:   None  Antimicrobials:   None   Subjective: Patient seen lungs patient's mother, patient's nurse and a sitter.  Explained in details need to  stay in the hospital and complete treatment.  Intermittently, patient bursts out that she would want to leave the hospital.  Patient's mother is concerned that patient has suicidal ideations.  Will consult psychiatry.  Objective: Vitals:   09/23/17 0600 09/23/17 0700 09/23/17 0800 09/23/17 0900  BP: 124/84 125/80 102/87   Pulse: 96 96 (!) 106   Resp: (!) 21 (!) 24 19   Temp:    98.2 F (36.8 C)  TempSrc:    Oral  SpO2: 99% 99% 98%   Weight:      Height:        Intake/Output Summary (Last 24 hours) at 09/23/2017 0906 Last data filed at 09/23/2017 0800 Gross per 24 hour  Intake 2030 ml  Output 2020 ml  Net 10 ml   Filed Weights   09/22/17 0441 09/23/17 0430  Weight: 46.3 kg (102 lb) 46.9 kg (103 lb 6.3 oz)    Examination:  General exam: Nothing in any distress.  The patient is awake and alert.  Patient is oriented to time, place and person.    Respiratory system: Clear to auscultation.  Cardiovascular system: S1 & S2. Gastrointestinal system: Abdomen is nondistended, soft and nontender. No organomegaly or masses felt. Normal bowel sounds heard. Central nervous system: Alert and oriented. No focal neurological deficits. Extremities: No leg edema.    Data Reviewed: I have personally reviewed following labs and imaging studies  CBC: Recent Labs  Lab 09/20/17 0758 09/21/17 0229 09/22/17 0536 09/23/17 0547  WBC 13.2* 10.7* 12.0* 7.0  NEUTROABS 9.7*  --  8.3*  --   HGB 10.1* 10.0* 10.5* 9.7*  HCT 31.9* 32.0* 33.9* 32.0*  MCV 76.9* 76.2* 77.0* 77.1*  PLT 474* 491* 520* 465*   Basic Metabolic Panel: Recent Labs  Lab 09/20/17 0758 09/21/17 0229 09/22/17 0536 09/23/17 0547  NA 136 136 136 138  K 3.9 3.6 3.2* 3.9  CL 104 106 106 107  CO2 21* 20* 21* 22  GLUCOSE 115* 112* 120* 117*  BUN 7 7 5* 8  CREATININE 0.44 0.46 0.42* 0.41*  CALCIUM 8.6* 8.6* 8.6* 8.6*   GFR: Estimated Creatinine Clearance: 76.1 mL/min (A) (by C-G formula based on SCr of 0.41 mg/dL  (L)). Liver Function Tests: Recent Labs  Lab 09/20/17 0758  AST 19  ALT 24  ALKPHOS 97  BILITOT 0.1*  PROT 7.8  ALBUMIN 3.2*   No results for input(s): LIPASE, AMYLASE in the last 168 hours. No results for input(s): AMMONIA in the last 168 hours. Coagulation Profile: No results for input(s): INR, PROTIME in the last 168 hours. Cardiac Enzymes: No results for input(s): CKTOTAL, CKMB, CKMBINDEX, TROPONINI in the last 168 hours. BNP (last 3 results) No results for input(s): PROBNP in the last 8760 hours. HbA1C: No results for input(s): HGBA1C in the last 72 hours. CBG: No results for input(s): GLUCAP in the last 168 hours. Lipid Profile: No results for input(s): CHOL, HDL, LDLCALC, TRIG, CHOLHDL, LDLDIRECT in the last 72 hours. Thyroid Function Tests: No results for input(s): TSH, T4TOTAL, FREET4, T3FREE, THYROIDAB in the last 72 hours. Anemia Panel: No results for input(s): VITAMINB12, FOLATE, FERRITIN, TIBC, IRON, RETICCTPCT in the last 72 hours. Urine analysis:    Component Value Date/Time   COLORURINE STRAW (  A) 09/20/2017 1620   APPEARANCEUR CLEAR 09/20/2017 1620   LABSPEC 1.006 09/20/2017 1620   PHURINE 8.0 09/20/2017 1620   GLUCOSEU NEGATIVE 09/20/2017 1620   HGBUR NEGATIVE 09/20/2017 1620   BILIRUBINUR NEGATIVE 09/20/2017 1620   KETONESUR NEGATIVE 09/20/2017 1620   PROTEINUR NEGATIVE 09/20/2017 1620   UROBILINOGEN 0.2 10/15/2012 1341   NITRITE NEGATIVE 09/20/2017 1620   LEUKOCYTESUR SMALL (A) 09/20/2017 1620   Sepsis Labs: @LABRCNTIP (procalcitonin:4,lacticidven:4)  ) Recent Results (from the past 240 hour(s))  Culture, blood (routine x 2)     Status: Abnormal   Collection Time: 09/20/17  8:49 AM  Result Value Ref Range Status   Specimen Description BLOOD LEFT HAND  Final   Special Requests   Final    BOTTLES DRAWN AEROBIC AND ANAEROBIC Blood Culture adequate volume   Culture  Setup Time   Final    GRAM POSITIVE COCCI IN BOTH AEROBIC AND ANAEROBIC  BOTTLES CRITICAL VALUE NOTED.  VALUE IS CONSISTENT WITH PREVIOUSLY REPORTED AND CALLED VALUE.    Culture (A)  Final    STAPHYLOCOCCUS AUREUS SUSCEPTIBILITIES PERFORMED ON PREVIOUS CULTURE WITHIN THE LAST 5 DAYS. Performed at [facility], [place] [address] [place], [place] [address]    Report Status 09/23/2017 FINAL  Final  Culture, blood (routine x 2)     Status: Abnormal   Collection Time: 09/20/17  9:04 AM  Result Value Ref Range Status   Specimen Description BLOOD LEFT ANTECUBITAL  Final   Special Requests   Final    BOTTLES DRAWN AEROBIC AND ANAEROBIC Blood Culture adequate volume   Culture  Setup Time   Final    GRAM POSITIVE COCCI IN BOTH AEROBIC AND ANAEROBIC BOTTLES Organism ID to follow CRITICAL RESULT CALLED TO, READ BACK BY AND VERIFIED WITH: Karsten Ro [facility] 09/21/17 0056 JDW Performed at [facility], [place] [address] [place], [place] [address]    Culture STAPHYLOCOCCUS AUREUS (A)  Final   Report Status 09/22/2017 FINAL  Final   Organism ID, Bacteria STAPHYLOCOCCUS AUREUS  Final      Susceptibility   Staphylococcus aureus - MIC*    CIPROFLOXACIN <=0.5 SENSITIVE Sensitive     ERYTHROMYCIN <=0.25 SENSITIVE Sensitive     GENTAMICIN <=0.5 SENSITIVE Sensitive     OXACILLIN 0.5 SENSITIVE Sensitive     TETRACYCLINE <=1 SENSITIVE Sensitive     VANCOMYCIN <=0.5 SENSITIVE Sensitive     TRIMETH/SULFA <=10 SENSITIVE Sensitive     CLINDAMYCIN <=0.25 SENSITIVE Sensitive     RIFAMPIN <=0.5 SENSITIVE Sensitive     Inducible Clindamycin NEGATIVE Sensitive     * STAPHYLOCOCCUS AUREUS  Blood Culture ID Panel (Reflexed)     Status: Abnormal   Collection Time: 09/20/17  9:04 AM  Result Value Ref Range Status   Enterococcus species NOT DETECTED NOT DETECTED Final   Listeria monocytogenes NOT DETECTED NOT DETECTED Final   Staphylococcus species DETECTED (A) NOT DETECTED Final    Comment: CRITICAL RESULT CALLED TO, READ BACK BY AND VERIFIED WITH: J LEDFORD [facility] 09/21/17  0056 JDW    Staphylococcus aureus DETECTED (A) NOT DETECTED Final    Comment: Methicillin (oxacillin) susceptible Staphylococcus aureus (MSSA). Preferred therapy is anti staphylococcal beta lactam antibiotic (Cefazolin or Nafcillin), unless clinically contraindicated. CRITICAL RESULT CALLED TO, READ BACK BY AND VERIFIED WITH: J LEDFORD [facility] 09/21/17 0056 JDW    Methicillin resistance NOT DETECTED NOT DETECTED Final   Streptococcus species NOT DETECTED NOT DETECTED Final   Streptococcus agalactiae NOT DETECTED NOT DETECTED  Final   Streptococcus pneumoniae NOT DETECTED NOT DETECTED Final   Streptococcus pyogenes NOT DETECTED NOT DETECTED Final   Acinetobacter baumannii NOT DETECTED NOT DETECTED Final   Enterobacteriaceae species NOT DETECTED NOT DETECTED Final   Enterobacter cloacae complex NOT DETECTED NOT DETECTED Final   Escherichia coli NOT DETECTED NOT DETECTED Final   Klebsiella oxytoca NOT DETECTED NOT DETECTED Final   Klebsiella pneumoniae NOT DETECTED NOT DETECTED Final   Proteus species NOT DETECTED NOT DETECTED Final   Serratia marcescens NOT DETECTED NOT DETECTED Final   Haemophilus influenzae NOT DETECTED NOT DETECTED Final   Neisseria meningitidis NOT DETECTED NOT DETECTED Final   Pseudomonas aeruginosa NOT DETECTED NOT DETECTED Final   Candida albicans NOT DETECTED NOT DETECTED Final   Candida glabrata NOT DETECTED NOT DETECTED Final   Candida krusei NOT DETECTED NOT DETECTED Final   Candida parapsilosis NOT DETECTED NOT DETECTED Final   Candida tropicalis NOT DETECTED NOT DETECTED Final    Comment: Performed at [facility], [place] [address], [place], [place] [address]  Culture, blood (single)     Status: None (Preliminary result)   Collection Time: 09/21/17  8:10 AM  Result Value Ref Range Status   Specimen Description BLOOD HAND  Final   Special Requests   Final    BOTTLES DRAWN AEROBIC ONLY Blood Culture adequate volume   Culture   Final    NO GROWTH 1  DAY Performed at [facility], [place] [address], [place], [place] [address]    Report Status PENDING  Incomplete  MRSA PCR Screening     Status: None   Collection Time: 09/23/17  4:28 AM  Result Value Ref Range Status   MRSA by PCR NEGATIVE NEGATIVE Final    Comment:        The GeneXpert MRSA Assay (FDA approved for NASAL specimens only), is one component of a comprehensive MRSA colonization surveillance program. It is not intended to diagnose MRSA infection nor to guide or monitor treatment for MRSA infections. Performed at [facility], [place] [address], [place], [place] [address]          Radiology Studies: No results found.      Scheduled Meds: . buprenorphine-naloxone  1 tablet Sublingual BID  . enoxaparin (LOVENOX) injection  40 mg Subcutaneous Q24H  . sodium chloride flush  3 mL Intravenous Q12H   Continuous Infusions: . sodium chloride    .  ceFAZolin (ANCEF) IV Stopped (09/23/17 0557)     LOS: 1 day    Time spent: 35 minutes    Dana Allan, MD  [facility] Pager #: [phone] 7PM-7AM contact night coverage as above

## 2017-09-24 DIAGNOSIS — F339 Major depressive disorder, recurrent, unspecified: Secondary | ICD-10-CM

## 2017-09-24 DIAGNOSIS — F419 Anxiety disorder, unspecified: Secondary | ICD-10-CM

## 2017-09-24 DIAGNOSIS — F329 Major depressive disorder, single episode, unspecified: Secondary | ICD-10-CM

## 2017-09-24 MED ORDER — SODIUM CHLORIDE 0.9 % IV SOLN: INTRAVENOUS | Status: DC

## 2017-09-24 MED ORDER — LORAZEPAM 2 MG/ML IJ SOLN
1.0000 mg | INTRAMUSCULAR | Status: DC | PRN
  Administered 2017-09-24 – 2017-09-25 (×5): 1 mg via INTRAVENOUS
  Filled 2017-09-24 (×5): qty 1

## 2017-09-24 NOTE — Progress Notes (Signed)
Patient transferred to 6N01 via wheelchair. Report given to [facility], Therapist, sports. Patient with sitter at bedside. Ipad transferred with patient and charger given to sitter.

## 2017-09-24 NOTE — Progress Notes (Signed)
INFECTIOUS DISEASE PROGRESS NOTE  ID: Sherri Francis is a 32 y.o. female with  Principal Problem:   Anxiety and depression Active Problems:   Bacteremia due to Staphylococcus aureus   Opioid type dependence, continuous ([place])   Acute osteomyelitis of cervical spine ([place])   Osteomyelitis ([place])  Subjective: I don't want to take suboxone  I do want ativan.   Abtx:  Anti-infectives (From admission, onward)   Start     Dose/Rate Route Frequency Ordered Stop   09/22/17 0530  ceFAZolin (ANCEF) IVPB 2g/100 mL premix     2 g 200 mL/hr over 30 Minutes Intravenous Every 8 hours 09/22/17 0505        Medications:  Scheduled: . buprenorphine-naloxone  1 tablet Sublingual BID  . enoxaparin (LOVENOX) injection  40 mg Subcutaneous Q24H  . sodium chloride flush  3 mL Intravenous Q12H    Objective: Vital signs in last 24 hours: Temp:  [98 F (36.7 C)-98.6 F (37 C)] 98.1 F (36.7 C) (04/19 0734) Pulse Rate:  [82-115] 82 (04/19 0734) Resp:  [19-26] 19 (04/19 0734) BP: (113-148)/(58-91) 120/79 (04/19 0734) SpO2:  [96 %-100 %] 100 % (04/19 0734)   General appearance: alert, cooperative and no distress Resp: clear to auscultation bilaterally Cardio: regular rate and rhythm GI: normal findings: bowel sounds normal and soft, non-tender  Lab Results Recent Labs    09/22/17 0536 09/23/17 0547  WBC 12.0* 7.0  HGB 10.5* 9.7*  HCT 33.9* 32.0*  NA 136 138  K 3.2* 3.9  CL 106 107  CO2 21* 22  BUN 5* 8  CREATININE 0.42* 0.41*   Liver Panel No results for input(s): PROT, ALBUMIN, AST, ALT, ALKPHOS, BILITOT, BILIDIR, IBILI in the last 72 hours. Sedimentation Rate No results for input(s): ESRSEDRATE in the last 72 hours. C-Reactive Protein No results for input(s): CRP in the last 72 hours.  Microbiology: Recent Results (from the past 240 hour(s))  Culture, blood (routine x 2)     Status: Abnormal   Collection Time: 09/20/17  8:49 AM  Result Value Ref Range Status   Specimen  Description BLOOD LEFT HAND  Final   Special Requests   Final    BOTTLES DRAWN AEROBIC AND ANAEROBIC Blood Culture adequate volume   Culture  Setup Time   Final    GRAM POSITIVE COCCI IN BOTH AEROBIC AND ANAEROBIC BOTTLES CRITICAL VALUE NOTED.  VALUE IS CONSISTENT WITH PREVIOUSLY REPORTED AND CALLED VALUE.    Culture (A)  Final    STAPHYLOCOCCUS AUREUS SUSCEPTIBILITIES PERFORMED ON PREVIOUS CULTURE WITHIN THE LAST 5 DAYS. Performed at [facility], [place] [address] [place], [place] [address]    Report Status 09/23/2017 FINAL  Final  Culture, blood (routine x 2)     Status: Abnormal   Collection Time: 09/20/17  9:04 AM  Result Value Ref Range Status   Specimen Description BLOOD LEFT ANTECUBITAL  Final   Special Requests   Final    BOTTLES DRAWN AEROBIC AND ANAEROBIC Blood Culture adequate volume   Culture  Setup Time   Final    GRAM POSITIVE COCCI IN BOTH AEROBIC AND ANAEROBIC BOTTLES Organism ID to follow CRITICAL RESULT CALLED TO, READ BACK BY AND VERIFIED WITH: Karsten Ro [facility] 09/21/17 0056 JDW Performed at [facility], [place] [address], [place], [place] [address]    Culture STAPHYLOCOCCUS AUREUS (A)  Final   Report Status 09/22/2017 FINAL  Final   Organism ID, Bacteria STAPHYLOCOCCUS AUREUS  Final  Susceptibility   Staphylococcus aureus - MIC*    CIPROFLOXACIN <=0.5 SENSITIVE Sensitive     ERYTHROMYCIN <=0.25 SENSITIVE Sensitive     GENTAMICIN <=0.5 SENSITIVE Sensitive     OXACILLIN 0.5 SENSITIVE Sensitive     TETRACYCLINE <=1 SENSITIVE Sensitive     VANCOMYCIN <=0.5 SENSITIVE Sensitive     TRIMETH/SULFA <=10 SENSITIVE Sensitive     CLINDAMYCIN <=0.25 SENSITIVE Sensitive     RIFAMPIN <=0.5 SENSITIVE Sensitive     Inducible Clindamycin NEGATIVE Sensitive     * STAPHYLOCOCCUS AUREUS  Blood Culture ID Panel (Reflexed)     Status: Abnormal   Collection Time: 09/20/17  9:04 AM  Result Value Ref Range Status   Enterococcus species NOT DETECTED NOT DETECTED  Final   Listeria monocytogenes NOT DETECTED NOT DETECTED Final   Staphylococcus species DETECTED (A) NOT DETECTED Final    Comment: CRITICAL RESULT CALLED TO, READ BACK BY AND VERIFIED WITH: J LEDFORD PHAR[facility] 09/21/17 0056 JDW    Staphylococcus aureus DETECTED (A) NOT DETECTED Final    Comment: Methicillin (oxacillin) susceptible Staphylococcus aureus (MSSA). Preferred therapy is anti staphylococcal beta lactam antibiotic (Cefazolin or Nafcillin), unless clinically contraindicated. CRITICAL RESULT CALLED TO, READ BACK BY AND VERIFIED WITH: J LEDFORD [facility] 09/21/17 0056 JDW    Methicillin resistance NOT DETECTED NOT DETECTED Final   Streptococcus species NOT DETECTED NOT DETECTED Final   Streptococcus agalactiae NOT DETECTED NOT DETECTED Final   Streptococcus pneumoniae NOT DETECTED NOT DETECTED Final   Streptococcus pyogenes NOT DETECTED NOT DETECTED Final   Acinetobacter baumannii NOT DETECTED NOT DETECTED Final   Enterobacteriaceae species NOT DETECTED NOT DETECTED Final   Enterobacter cloacae complex NOT DETECTED NOT DETECTED Final   Escherichia coli NOT DETECTED NOT DETECTED Final   Klebsiella oxytoca NOT DETECTED NOT DETECTED Final   Klebsiella pneumoniae NOT DETECTED NOT DETECTED Final   Proteus species NOT DETECTED NOT DETECTED Final   Serratia marcescens NOT DETECTED NOT DETECTED Final   Haemophilus influenzae NOT DETECTED NOT DETECTED Final   Neisseria meningitidis NOT DETECTED NOT DETECTED Final   Pseudomonas aeruginosa NOT DETECTED NOT DETECTED Final   Candida albicans NOT DETECTED NOT DETECTED Final   Candida glabrata NOT DETECTED NOT DETECTED Final   Candida krusei NOT DETECTED NOT DETECTED Final   Candida parapsilosis NOT DETECTED NOT DETECTED Final   Candida tropicalis NOT DETECTED NOT DETECTED Final    Comment: Performed at [facility], [place]. [address], [place], [place] [address]  Culture, blood (single)     Status: None (Preliminary result)   Collection Time:  09/21/17  8:10 AM  Result Value Ref Range Status   Specimen Description BLOOD HAND  Final   Special Requests   Final    BOTTLES DRAWN AEROBIC ONLY Blood Culture adequate volume   Culture   Final    NO GROWTH 2 DAYS Performed at [facility], [place] [address], [place], [place] [address]    Report Status PENDING  Incomplete  MRSA PCR Screening     Status: None   Collection Time: 09/23/17  4:28 AM  Result Value Ref Range Status   MRSA by PCR NEGATIVE NEGATIVE Final    Comment:        The GeneXpert MRSA Assay (FDA approved for NASAL specimens only), is one component of a comprehensive MRSA colonization surveillance program. It is not intended to diagnose MRSA infection nor to guide or monitor treatment for MRSA infections. Performed at [facility], [place]  [address], [place], [place] [address]     Studies/Results: No results found.   Assessment/Plan: Prevertebral edema C1-5 MSSA bacteremia Previous MSSA and Strep TV IE Hepatitis C Opioid use d/o Withdrawing (heroin, cocaine?) IVC  Total days of antibiotics: 4 ancef  Repeat BCx 4-16 ngtd.  apprecaite psych eval .  I let her know that she does not have to take suboxone but I will not be prescribing ativan for her.  Please continue ancef Again, if she leaves AMA, would try to give her single dose of oritavncin or dalbavanacin.  ID is available prn over weekend.          Bobby Rumpf MD, FACP Infectious Diseases (pager) [phone] [email or web address] 09/24/2017, 8:23 AM  LOS: 2 days

## 2017-09-24 NOTE — Progress Notes (Signed)
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  She was admitted for acute osteomyelitis of the cervical spine from 4/15-16, when she left AMA to resume use of heroin.  During her hospitalization, she was started on Vanc and Zosyn and had 2/2 cultures positive for MSSA and so was changed to nafcillin with ID following.  She was scheduled for TEE but left before this could be performed.  Patient represents with intractable neck pain.  Last use of heroin was a day prior to admission, quantity uncertain.  She injects in her hands and arms.    Dr. Baruch Gouty is managing patient's withdrawal with Suboxone.  Will reconsult infectious disease team, Dr. Johnnye Sima.  We will also consult neurosurgery team (Dr. Christella Noa).  TEE was planned before the patient signed herself out of the hospital [place].  We will consult the cardiology team for possible TEE.  I have been made to understand that the patient is committed involuntarily.  More importantly, patient's mother tells me that there are concerns that the patient may be suicidal.  Will consult psychiatric team.  We will continue to monitor patient expectantly.  09/24/2017: Patient seen.  No new complaints.  Psychiatry input is appreciated.  Infectious disease team input is appreciated.  Discussed with the cardiology team, hopefully, the patient will proceed with TEE in 2 days time (09/26/2017).  Please keep patient n.p.o. after midnight on 09/25/2017.  Patient is not asking to leave the hospital.  Patient is asking for as needed Ativan.  Otherwise, no significant withdrawal  symptoms noted.  We will continue antibiotics as planned.  Patient understands that she will need antibiotics for 6 to 8 weeks.  No fever, no chills, no shortness of breath, no chest pain and headache or neck pain.  Assessment & Plan:   Principal Problem:   Anxiety and depression Active Problems:   Bacteremia due to Staphylococcus aureus   Opioid type dependence, continuous (HCC)   Acute osteomyelitis of cervical spine (HCC)   Osteomyelitis (HCC)   Acute osteomyelitis of the cervical spine with MSSA bacteremia -Patient previously admitted 4/15-16 and left AMA -She returns due to intractable pain -She does report a commitment to treatment this time -She has been resumed on Ancef, as per ID recommendations -Dr. Johnnye Sima has been notified of recurrent admission -She was scheduled for TEE tomorrow during the prior admission; this is likely needed once again -09/24/2017: Kindly see above.    Opiate dependence -Long-standing heroin dependence -She has a pattern of leaving AMA and returning to the ER -She appears to already be starting to withdraw -Will initiate suboxone therapy now; while this may precipitate early withdrawal, hopefully she can reach a level of withdrawal symptom relief that will enable her to remain hospitalized and complete treatment -I spoke with Dr. Evette Doffing for assistance in initiating Suboxone.  Will start 0.4 mg SL now and recheck her in a few hours to administer a second dose if she is tolerating well.  If her second dose also goes well, she can proceed with 8 mg SL BID starting tonight. -Will monitor on [facility] protocol -prn orders from the  Clonidine withdrawal order set were also ordered. -Will admit to SDU due to the frequent need to assess her symptoms and closely monitor her -09/24/2017: Patient is refusing to take his Suboxone.  Continue as needed Ativan.  Patient is stable to be transferred to the [place] floor.  Suicidal ideations: -Patient's mother raises  concern for possible suicidal ideations and ??behavior -I understand that the patient is committed voluntarily (I guess due to capacity to make medical decisions) -We will consult psychiatric team to assess patient regarding suicidal ideation and capacity to make medical decisions. -09/24/2017: Kindly refer to the psychiatric consultation.  DVT prophylaxis:  Lovenox Code Status: Full  Family Communication:  Mother.  Disposition Plan:   Will depend on hospital course.  Patient may need drug rehab.   Consults called: ID.  Neurosurgery and cardiology team for possible TEE.   Procedures:   None  Antimicrobials:   None   Subjective: Patient seen alongside patient's Nurse. No new complaints. No fever or chills. No headache or neck pain. No SOB.  Objective: Vitals:   09/24/17 0734 09/24/17 0800 09/24/17 0900 09/24/17 1000  BP:  137/90 120/82 114/77  Pulse:  100 92 88  Resp: 19 20 (!) 27 15  Temp: 98.1 F (36.7 C)     TempSrc: Oral     SpO2: 100% 99% 98% 100%  Weight:      Height:        Intake/Output Summary (Last 24 hours) at 09/24/2017 1026 Last data filed at 09/24/2017 1012 Gross per 24 hour  Intake 1278 ml  Output 0 ml  Net 1278 ml   Filed Weights   09/22/17 0441 09/23/17 0430  Weight: 46.3 kg (102 lb) 46.9 kg (103 lb 6.3 oz)    Examination:  General exam: Nothing in any distress.  The patient is awake and alert.  Patient is oriented to time, place and person.    Respiratory system: Clear to auscultation.  Cardiovascular system: S1 & S2. Gastrointestinal system: Abdomen is nondistended, soft and nontender. No organomegaly or masses felt. Normal bowel sounds heard. Central nervous system: Alert and oriented. No focal neurological deficits. Extremities: No leg edema.    Data Reviewed: I have personally reviewed following labs and imaging studies  CBC: Recent Labs  Lab 09/20/17 0758 09/21/17 0229 09/22/17 0536 09/23/17 0547  WBC 13.2* 10.7* 12.0* 7.0    NEUTROABS 9.7*  --  8.3*  --   HGB 10.1* 10.0* 10.5* 9.7*  HCT 31.9* 32.0* 33.9* 32.0*  MCV 76.9* 76.2* 77.0* 77.1*  PLT 474* 491* 520* 630*   Basic Metabolic Panel: Recent Labs  Lab 09/20/17 0758 09/21/17 0229 09/22/17 0536 09/23/17 0547  NA 136 136 136 138  K 3.9 3.6 3.2* 3.9  CL 104 106 106 107  CO2 21* 20* 21* 22  GLUCOSE 115* 112* 120* 117*  BUN 7 7 5* 8  CREATININE 0.44 0.46 0.42* 0.41*  CALCIUM 8.6* 8.6* 8.6* 8.6*   GFR: Estimated Creatinine Clearance: 76.1 mL/min (A) (by C-G formula based on SCr of 0.41 mg/dL (L)). Liver Function Tests: Recent Labs  Lab 09/20/17 0758  AST 19  ALT 24  ALKPHOS 97  BILITOT 0.1*  PROT 7.8  ALBUMIN 3.2*   No results for input(s): LIPASE, AMYLASE in the last 168 hours. No results for input(s): AMMONIA in the last 168 hours. Coagulation Profile: No results for input(s): INR, PROTIME in the last 168 hours. Cardiac Enzymes: No results for input(s): CKTOTAL, CKMB, CKMBINDEX, TROPONINI in the last  168 hours. BNP (last 3 results) No results for input(s): PROBNP in the last 8760 hours. HbA1C: No results for input(s): HGBA1C in the last 72 hours. CBG: No results for input(s): GLUCAP in the last 168 hours. Lipid Profile: No results for input(s): CHOL, HDL, LDLCALC, TRIG, CHOLHDL, LDLDIRECT in the last 72 hours. Thyroid Function Tests: No results for input(s): TSH, T4TOTAL, FREET4, T3FREE, THYROIDAB in the last 72 hours. Anemia Panel: No results for input(s): VITAMINB12, FOLATE, FERRITIN, TIBC, IRON, RETICCTPCT in the last 72 hours. Urine analysis:    Component Value Date/Time   COLORURINE STRAW (A) 09/20/2017 1620   APPEARANCEUR CLEAR 09/20/2017 1620   LABSPEC 1.006 09/20/2017 1620   PHURINE 8.0 09/20/2017 1620   GLUCOSEU NEGATIVE 09/20/2017 1620   HGBUR NEGATIVE 09/20/2017 1620   BILIRUBINUR NEGATIVE 09/20/2017 1620   KETONESUR NEGATIVE 09/20/2017 1620   PROTEINUR NEGATIVE 09/20/2017 1620   UROBILINOGEN 0.2 10/15/2012 1341    NITRITE NEGATIVE 09/20/2017 1620   LEUKOCYTESUR SMALL (A) 09/20/2017 1620   Sepsis Labs: @LABRCNTIP (procalcitonin:4,lacticidven:4)  ) Recent Results (from the past 240 hour(s))  Culture, blood (routine x 2)     Status: Abnormal   Collection Time: 09/20/17  8:49 AM  Result Value Ref Range Status   Specimen Description BLOOD LEFT HAND  Final   Special Requests   Final    BOTTLES DRAWN AEROBIC AND ANAEROBIC Blood Culture adequate volume   Culture  Setup Time   Final    GRAM POSITIVE COCCI IN BOTH AEROBIC AND ANAEROBIC BOTTLES CRITICAL VALUE NOTED.  VALUE IS CONSISTENT WITH PREVIOUSLY REPORTED AND CALLED VALUE.    Culture (A)  Final    STAPHYLOCOCCUS AUREUS SUSCEPTIBILITIES PERFORMED ON PREVIOUS CULTURE WITHIN THE LAST 5 DAYS. Performed at [facility], [place] [address] [place], [place] [address]    Report Status 09/23/2017 FINAL  Final  Culture, blood (routine x 2)     Status: Abnormal   Collection Time: 09/20/17  9:04 AM  Result Value Ref Range Status   Specimen Description BLOOD LEFT ANTECUBITAL  Final   Special Requests   Final    BOTTLES DRAWN AEROBIC AND ANAEROBIC Blood Culture adequate volume   Culture  Setup Time   Final    GRAM POSITIVE COCCI IN BOTH AEROBIC AND ANAEROBIC BOTTLES Organism ID to follow CRITICAL RESULT CALLED TO, READ BACK BY AND VERIFIED WITH: Karsten Ro [facility] 09/21/17 0056 JDW Performed at [facility], [place] [address], [place], [place] [address]    Culture STAPHYLOCOCCUS AUREUS (A)  Final   Report Status 09/22/2017 FINAL  Final   Organism ID, Bacteria STAPHYLOCOCCUS AUREUS  Final      Susceptibility   Staphylococcus aureus - MIC*    CIPROFLOXACIN <=0.5 SENSITIVE Sensitive     ERYTHROMYCIN <=0.25 SENSITIVE Sensitive     GENTAMICIN <=0.5 SENSITIVE Sensitive     OXACILLIN 0.5 SENSITIVE Sensitive     TETRACYCLINE <=1 SENSITIVE Sensitive     VANCOMYCIN <=0.5 SENSITIVE Sensitive     TRIMETH/SULFA <=10 SENSITIVE Sensitive     CLINDAMYCIN  <=0.25 SENSITIVE Sensitive     RIFAMPIN <=0.5 SENSITIVE Sensitive     Inducible Clindamycin NEGATIVE Sensitive     * STAPHYLOCOCCUS AUREUS  Blood Culture ID Panel (Reflexed)     Status: Abnormal   Collection Time: 09/20/17  9:04 AM  Result Value Ref Range Status   Enterococcus species NOT DETECTED NOT DETECTED Final   Listeria monocytogenes NOT DETECTED NOT DETECTED Final   Staphylococcus species DETECTED (  A) NOT DETECTED Final    Comment: CRITICAL RESULT CALLED TO, READ BACK BY AND VERIFIED WITH: J LEDFORD [facility]AR[facility] 09/21/17 0056 JDW    Staphylococcus aureus DETECTED (A) NOT DETECTED Final    Comment: Methicillin (oxacillin) susceptible Staphylococcus aureus (MSSA). Preferred therapy is anti staphylococcal beta lactam antibiotic (Cefazolin or Nafcillin), unless clinically contraindicated. CRITICAL RESULT CALLED TO, READ BACK BY AND VERIFIED WITH: J LEDFORD [facility] 09/21/17 0056 JDW    Methicillin resistance NOT DETECTED NOT DETECTED Final   Streptococcus species NOT DETECTED NOT DETECTED Final   Streptococcus agalactiae NOT DETECTED NOT DETECTED Final   Streptococcus pneumoniae NOT DETECTED NOT DETECTED Final   Streptococcus pyogenes NOT DETECTED NOT DETECTED Final   Acinetobacter baumannii NOT DETECTED NOT DETECTED Final   Enterobacteriaceae species NOT DETECTED NOT DETECTED Final   Enterobacter cloacae complex NOT DETECTED NOT DETECTED Final   Escherichia coli NOT DETECTED NOT DETECTED Final   Klebsiella oxytoca NOT DETECTED NOT DETECTED Final   Klebsiella pneumoniae NOT DETECTED NOT DETECTED Final   Proteus species NOT DETECTED NOT DETECTED Final   Serratia marcescens NOT DETECTED NOT DETECTED Final   Haemophilus influenzae NOT DETECTED NOT DETECTED Final   Neisseria meningitidis NOT DETECTED NOT DETECTED Final   Pseudomonas aeruginosa NOT DETECTED NOT DETECTED Final   Candida albicans NOT DETECTED NOT DETECTED Final   Candida glabrata NOT DETECTED NOT DETECTED Final   Candida  krusei NOT DETECTED NOT DETECTED Final   Candida parapsilosis NOT DETECTED NOT DETECTED Final   Candida tropicalis NOT DETECTED NOT DETECTED Final    Comment: Performed at [facility], [place]. [address], [place], [place] [address]  Culture, blood (single)     Status: None (Preliminary result)   Collection Time: 09/21/17  8:10 AM  Result Value Ref Range Status   Specimen Description BLOOD HAND  Final   Special Requests   Final    BOTTLES DRAWN AEROBIC ONLY Blood Culture adequate volume   Culture   Final    NO GROWTH 2 DAYS Performed at [facility], [place] [address] [place], [place] [address]    Report Status PENDING  Incomplete  MRSA PCR Screening     Status: None   Collection Time: 09/23/17  4:28 AM  Result Value Ref Range Status   MRSA by PCR NEGATIVE NEGATIVE Final    Comment:        The GeneXpert MRSA Assay (FDA approved for NASAL specimens only), is one component of a comprehensive MRSA colonization surveillance program. It is not intended to diagnose MRSA infection nor to guide or monitor treatment for MRSA infections. Performed at [facility], [place] [address], [place], [place] [address]          Radiology Studies: No results found.      Scheduled Meds: . buprenorphine-naloxone  1 tablet Sublingual BID  . enoxaparin (LOVENOX) injection  40 mg Subcutaneous Q24H  . sodium chloride flush  3 mL Intravenous Q12H   Continuous Infusions: . sodium chloride    . sodium chloride    .  ceFAZolin (ANCEF) IV 2 g (09/24/17 0600)     LOS: 2 days    Time spent: 25 minutes    Dana Allan, MD  [facility] Pager #: [phone] 7PM-7AM contact night coverage as above

## 2017-09-24 NOTE — Progress Notes (Signed)
Patient in fetal position on bed with blanket.  She was open to visit from chaplain and asked what hospital chaplains do.  I shared we are here for patients to encourage, to pray, whatever we could do to help patients in their time here.  She told me about her infection and the long weeks of treatment coming and then the rehab afterwards.  She shared info about her boyfriend who died from drug use and how his mother begged her to get help for her problem.  She started to get tired and asked for prayer and I prayed for her and encouraged her to work hard to complete her treatments. Conard Novak, Chaplain   09/24/17 1400  Clinical Encounter Type  Visited With Patient;Other (Comment) Freight forwarder)  Visit Type Initial;Spiritual support  Referral From Physician  Consult/Referral To Chaplain  Spiritual Encounters  Spiritual Needs Prayer;Emotional;Other (Comment) (encouragement)  Stress Factors  Patient Stress Factors Other (Comment) (treatment for infection then drug rehabilitation facility)

## 2017-09-24 NOTE — Progress Notes (Addendum)
10:01am- CSW has provided pt with outpatient and inpt substance abuse rehab resources at this time.   CSW spoke with pt and sitter at bedside. Pt now requesting both residential and outpatient resources at the time of discharge. Pt stated "that I need someone to hold my hand while doing this and my mom is not a good candidate". CSW validated this concern and sought further information on this. Pt expresses that pt's mother likes for things to be done her way and not listen to pt. CSW suggested that pt may have to have a conversation with mom and explain willingness to get help but needing more support and guidance from mom. At this time CSW will continue to follow for any further needs.      Virgie Dad Shawnta Zimbelman, MSW, [facility] Clinical Social Worker [phone]

## 2017-09-25 DIAGNOSIS — F1990 Other psychoactive substance use, unspecified, uncomplicated: Secondary | ICD-10-CM

## 2017-09-25 MED ORDER — CLONIDINE HCL 0.1 MG PO TABS
0.1000 mg | ORAL_TABLET | ORAL | Status: AC
  Administered 2017-09-27 – 2017-09-28 (×4): 0.1 mg via ORAL
  Filled 2017-09-25 (×5): qty 1

## 2017-09-25 MED ORDER — CLONIDINE HCL 0.1 MG PO TABS
0.1000 mg | ORAL_TABLET | Freq: Every day | ORAL | Status: AC
  Administered 2017-09-29 – 2017-09-30 (×2): 0.1 mg via ORAL
  Filled 2017-09-25 (×2): qty 1

## 2017-09-25 MED ORDER — GABAPENTIN 100 MG PO CAPS
200.0000 mg | ORAL_CAPSULE | Freq: Three times a day (TID) | ORAL | Status: DC
  Administered 2017-09-25 – 2017-10-02 (×19): 200 mg via ORAL
  Filled 2017-09-25 (×19): qty 2

## 2017-09-25 MED ORDER — HYDROXYZINE HCL 25 MG PO TABS: 25.0000 mg | ORAL_TABLET | Freq: Four times a day (QID) | ORAL | Status: AC | PRN

## 2017-09-25 MED ORDER — LORAZEPAM 1 MG PO TABS: 1.0000 mg | ORAL_TABLET | ORAL | Status: DC | PRN

## 2017-09-25 MED ORDER — CLONIDINE HCL 0.1 MG PO TABS
0.1000 mg | ORAL_TABLET | Freq: Four times a day (QID) | ORAL | Status: AC
  Administered 2017-09-25 – 2017-09-26 (×7): 0.1 mg via ORAL
  Filled 2017-09-25 (×7): qty 1

## 2017-09-25 MED ORDER — LORAZEPAM 1 MG PO TABS
1.0000 mg | ORAL_TABLET | ORAL | Status: DC | PRN
  Administered 2017-09-25 (×2): 1 mg via ORAL
  Filled 2017-09-25 (×2): qty 1

## 2017-09-25 MED ORDER — LORAZEPAM 1 MG PO TABS
1.0000 mg | ORAL_TABLET | Freq: Four times a day (QID) | ORAL | Status: DC | PRN
  Administered 2017-09-25: 1 mg via ORAL
  Filled 2017-09-25: qty 1

## 2017-09-25 MED ORDER — LORAZEPAM 1 MG PO TABS: 1.0000 mg | ORAL_TABLET | Freq: Two times a day (BID) | ORAL | Status: AC | PRN

## 2017-09-25 NOTE — Progress Notes (Signed)
Will restrict visitors to only mother.

## 2017-09-25 NOTE — Progress Notes (Signed)
Sitter overheard pt talking to someone on the phone stating that the visitor would bring her a "stick."  Texted Dr Lonny Prude about restricting visitors.

## 2017-09-25 NOTE — Progress Notes (Signed)
PROGRESS NOTE    Sherri Francis  KGM:[id number] DOB: 1985-10-17 DOA: 09/22/2017 PCP: Patient, No Pcp Per   Brief Narrative: Sherri Francis is a 32 y.o. female with a history of polysubstance/IV drug use, hepatitis C, MSSA endocarditis. She presented after leaving AMA multiple times in the last 8 months for incomplete treatment of her MSSA endocarditis. She is currently IVCd with recommendations for inpatient behavioral health admission. Patient refusing suboxone therapy. Clonidine taper for withdrawal.   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 Currently IVCd. Repeat blood culture from 4/16 (single), no growth to date -Continue Cefazolin -Transesophageal Echocardiogram pending -Infectious disease recommendations: Cefazolin -Follow up repeat cultures from 4/16  Suicidal ideation IVC. Psychiatry recommending inpatient psych. Started on Effexor. -Discussed with psychiatry on 4/20. Will come weekly. May need to reconsult on the weekday  Opiate dependence Polysubstance abuse Patient injects heroin. Also has history of cocaine use. Feels like she is in withdrawal. -Clonidine taper -symptom control with atarax, robaxin, naproxen -suboxone prescribed, however, patient is refusing -started on ativan, will taper down and add hydroxyzine   DVT prophylaxis: Lovenox Code Status:   Code Status: Full Code Family Communication: None at bedside Disposition Plan: Discharge to behavioral health hospital when bacteremia treated.   Consultants:   Infectious disease  Cardiology  Procedures:   None  Antimicrobials:  Cefazolin    Subjective: Wants to be taken of suicidal  precautions. Feels anxious.  Objective: Vitals:   09/24/17 1800 09/24/17 1900 09/24/17 2012 09/25/17 0603  BP: 121/68 111/72 120/82 117/88  Pulse: 100 91 81 (!) 104  Resp: (!) 30 (!) 23 (!) 25 (!) 22  Temp:   98 F (36.7 C) 98.3 F (36.8 C)  TempSrc:   Oral Oral  SpO2: 97% 96% 100% 100%  Weight:   49.9 kg (110 lb)   Height:   5\' 1"  (1.549 m)     Intake/Output Summary (Last 24 hours) at 09/25/2017 1102 Last data filed at 09/25/2017 6440 Gross per 24 hour  Intake 750 ml  Output 900 ml  Net -150 ml   Filed Weights   09/22/17 0441 09/23/17 0430 09/24/17 2012  Weight: 46.3 kg (102 lb) 46.9 kg (103 lb 6.3 oz) 49.9 kg (110 lb)    Examination:  General exam: Appears calm and comfortable Respiratory system: Respiratory effort normal. Gastrointestinal system: Abdomen is non-distended Central nervous system: Alert and oriented. No focal neurological deficits. Extremities: No edema. Skin: No cyanosis. No rashes Psychiatry: No suicidal ideation. Anxious. Depressed.    Data Reviewed: I have personally reviewed following labs and imaging studies  CBC: Recent Labs  Lab 09/20/17 0758 09/21/17 0229 09/22/17 0536 09/23/17 0547  WBC 13.2* 10.7* 12.0* 7.0  NEUTROABS 9.7*  --  8.3*  --   HGB 10.1* 10.0* 10.5* 9.7*  HCT 31.9* 32.0* 33.9* 32.0*  MCV 76.9* 76.2* 77.0* 77.1*  PLT 474* 491* 520* 347*   Basic Metabolic Panel: Recent Labs  Lab 09/20/17 0758 09/21/17 0229 09/22/17 0536 09/23/17 0547  NA 136 136 136 138  K 3.9 3.6 3.2* 3.9  CL 104 106 106 107  CO2 21* 20* 21* 22  GLUCOSE 115* 112* 120* 117*  BUN 7 7 5* 8  CREATININE 0.44 0.46  0.42* 0.41*  CALCIUM 8.6* 8.6* 8.6* 8.6*   GFR: Estimated Creatinine Clearance: 77.6 mL/min (A) (by C-G formula based on SCr of 0.41 mg/dL (L)). Liver Function Tests: Recent Labs  Lab 09/20/17 0758  AST 19  ALT 24  ALKPHOS 97  BILITOT 0.1*  PROT 7.8  ALBUMIN 3.2*   No results for input(s): LIPASE, AMYLASE in the last 168  hours. No results for input(s): AMMONIA in the last 168 hours. Coagulation Profile: No results for input(s): INR, PROTIME in the last 168 hours. Cardiac Enzymes: No results for input(s): CKTOTAL, CKMB, CKMBINDEX, TROPONINI in the last 168 hours. BNP (last 3 results) No results for input(s): PROBNP in the last 8760 hours. HbA1C: No results for input(s): HGBA1C in the last 72 hours. CBG: No results for input(s): GLUCAP in the last 168 hours. Lipid Profile: No results for input(s): CHOL, HDL, LDLCALC, TRIG, CHOLHDL, LDLDIRECT in the last 72 hours. Thyroid Function Tests: No results for input(s): TSH, T4TOTAL, FREET4, T3FREE, THYROIDAB in the last 72 hours. Anemia Panel: No results for input(s): VITAMINB12, FOLATE, FERRITIN, TIBC, IRON, RETICCTPCT in the last 72 hours. Sepsis Labs: No results for input(s): PROCALCITON, LATICACIDVEN in the last 168 hours.  Recent Results (from the past 240 hour(s))  Culture, blood (routine x 2)     Status: Abnormal   Collection Time: 09/20/17  8:49 AM  Result Value Ref Range Status   Specimen Description BLOOD LEFT HAND  Final   Special Requests   Final    BOTTLES DRAWN AEROBIC AND ANAEROBIC Blood Culture adequate volume   Culture  Setup Time   Final    GRAM POSITIVE COCCI IN BOTH AEROBIC AND ANAEROBIC BOTTLES CRITICAL VALUE NOTED.  VALUE IS CONSISTENT WITH PREVIOUSLY REPORTED AND CALLED VALUE.    Culture (A)  Final    STAPHYLOCOCCUS AUREUS SUSCEPTIBILITIES PERFORMED ON PREVIOUS CULTURE WITHIN THE LAST 5 DAYS. Performed at [facility], [place] [address], [place], [place] [address]    Report Status 09/23/2017 FINAL  Final  Culture, blood (routine x 2)     Status: Abnormal   Collection Time: 09/20/17  9:04 AM  Result Value Ref Range Status   Specimen Description BLOOD LEFT ANTECUBITAL  Final   Special Requests   Final    BOTTLES DRAWN AEROBIC AND ANAEROBIC Blood Culture adequate volume   Culture  Setup Time   Final    GRAM POSITIVE  COCCI IN BOTH AEROBIC AND ANAEROBIC BOTTLES Organism ID to follow CRITICAL RESULT CALLED TO, READ BACK BY AND VERIFIED WITH: Karsten Ro [facility] 09/21/17 0056 JDW Performed at [facility], [place] [address], [place], [place] [address]    Culture STAPHYLOCOCCUS AUREUS (A)  Final   Report Status 09/22/2017 FINAL  Final   Organism ID, Bacteria STAPHYLOCOCCUS AUREUS  Final      Susceptibility   Staphylococcus aureus - MIC*    CIPROFLOXACIN <=0.5 SENSITIVE Sensitive     ERYTHROMYCIN <=0.25 SENSITIVE Sensitive     GENTAMICIN <=0.5 SENSITIVE Sensitive     OXACILLIN 0.5 SENSITIVE Sensitive     TETRACYCLINE <=1 SENSITIVE Sensitive     VANCOMYCIN <=0.5 SENSITIVE Sensitive     TRIMETH/SULFA <=10 SENSITIVE Sensitive     CLINDAMYCIN <=0.25 SENSITIVE Sensitive     RIFAMPIN <=0.5 SENSITIVE Sensitive     Inducible Clindamycin NEGATIVE Sensitive     * STAPHYLOCOCCUS AUREUS  Blood Culture ID Panel (Reflexed)     Status: Abnormal   Collection Time: 09/20/17  9:04 AM  Result Value Ref Range Status   Enterococcus species NOT DETECTED NOT DETECTED Final   Listeria monocytogenes NOT DETECTED NOT DETECTED Final   Staphylococcus species DETECTED (A) NOT DETECTED Final    Comment: CRITICAL RESULT CALLED TO, READ BACK BY AND VERIFIED WITH: J LEDFORD PHARMD 09/21/17 0056 JDW    Staphylococcus aureus DETECTED (A) NOT DETECTED Final    Comment: Methicillin (oxacillin) susceptible Staphylococcus aureus (MSSA). Preferred therapy is anti staphylococcal beta lactam antibiotic (Cefazolin or Nafcillin), unless clinically contraindicated. CRITICAL RESULT CALLED TO, READ BACK BY AND VERIFIED WITH: J LEDFORD [facility] 09/21/17 0056 JDW    Methicillin resistance NOT DETECTED NOT DETECTED Final   Streptococcus species NOT DETECTED NOT DETECTED Final   Streptococcus agalactiae NOT DETECTED NOT DETECTED Final   Streptococcus pneumoniae NOT DETECTED NOT DETECTED Final   Streptococcus pyogenes NOT DETECTED NOT DETECTED Final    Acinetobacter baumannii NOT DETECTED NOT DETECTED Final   Enterobacteriaceae species NOT DETECTED NOT DETECTED Final   Enterobacter cloacae complex NOT DETECTED NOT DETECTED Final   Escherichia coli NOT DETECTED NOT DETECTED Final   Klebsiella oxytoca NOT DETECTED NOT DETECTED Final   Klebsiella pneumoniae NOT DETECTED NOT DETECTED Final   Proteus species NOT DETECTED NOT DETECTED Final   Serratia marcescens NOT DETECTED NOT DETECTED Final   Haemophilus influenzae NOT DETECTED NOT DETECTED Final   Neisseria meningitidis NOT DETECTED NOT DETECTED Final   Pseudomonas aeruginosa NOT DETECTED NOT DETECTED Final   Candida albicans NOT DETECTED NOT DETECTED Final   Candida glabrata NOT DETECTED NOT DETECTED Final   Candida krusei NOT DETECTED NOT DETECTED Final   Candida parapsilosis NOT DETECTED NOT DETECTED Final   Candida tropicalis NOT DETECTED NOT DETECTED Final    Comment: Performed at [facility], [place]. [address] [place], [place] [address]  Culture, blood (single)     Status: None (Preliminary result)   Collection Time: 09/21/17  8:10 AM  Result Value Ref Range Status   Specimen Description BLOOD HAND  Final   Special Requests   Final    BOTTLES DRAWN AEROBIC ONLY Blood Culture adequate volume   Culture   Final    NO GROWTH 3 DAYS Performed at [facility], [place] [address] [place], [place] [address]    Report Status PENDING  Incomplete  MRSA PCR Screening     Status: None   Collection Time: 09/23/17  4:28 AM  Result Value Ref Range Status   MRSA by PCR NEGATIVE NEGATIVE Final    Comment:        The GeneXpert MRSA Assay (FDA approved for NASAL specimens only), is one component of a comprehensive MRSA colonization surveillance program. It is not intended to diagnose MRSA infection nor to guide or monitor treatment for MRSA infections. Performed at [facility], [place] [address] [place], [place] [address]          Radiology Studies: No results  found.      Scheduled Meds: . buprenorphine-naloxone  1 tablet Sublingual BID  . cloNIDine  0.1 mg Oral QID   Followed by  . [START ON 09/27/2017] cloNIDine  0.1 mg Oral BH-qamhs   Followed by  . [START ON 09/29/2017] cloNIDine  0.1 mg Oral QAC breakfast  . enoxaparin (LOVENOX) injection  40 mg Subcutaneous Q24H  . gabapentin  200 mg Oral TID  . sodium chloride flush  3 mL Intravenous Q12H   Continuous Infusions: . sodium chloride 10 mL/hr at 09/24/17 2116  .  sodium chloride    .  ceFAZolin (ANCEF) IV 2 g (09/25/17 0534)     LOS: 3 days     Cordelia Poche, MD [facility] 09/25/2017, 11:02 AM Pager: [phone]  If 7PM-7AM, please contact night-coverage [email or web address] Password [email or web address] 09/25/2017, 11:02 AM

## 2017-09-26 LAB — CULTURE, BLOOD (SINGLE)
Culture: NO GROWTH
Special Requests: ADEQUATE

## 2017-09-26 NOTE — Progress Notes (Signed)
PROGRESS NOTE    Sherri Francis  JKK:[id number] DOB: 1985/07/16 DOA: 09/22/2017 PCP: Patient, No Pcp Per   Brief Narrative: Sherri Francis is a 32 y.o. female with a history of polysubstance/IV drug use, hepatitis C, MSSA endocarditis. She presented after leaving AMA multiple times in the last 8 months for incomplete treatment of her MSSA endocarditis. She is currently IVCd with recommendations for inpatient behavioral health admission. Patient refusing suboxone therapy. Clonidine taper for withdrawal.   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 Currently IVCd. Repeat blood culture from 4/16 (single), no growth to date -Continue cefazolin -Transesophageal Echocardiogram pending -Infectious disease recommendations: Cefazolin -Follow up repeat cultures from 4/16  Suicidal ideation IVC. Psychiatry recommending inpatient psych. Started on Effexor. -Discussed with psychiatry on 4/20. Will come weekly. May need to reconsult on the weekday. Will consult again on 4/22  Opiate dependence Polysubstance abuse Patient desires abstinence from IV drug use. -Clonidine taper -symptom control with atarax, robaxin, naproxen -suboxone -started on ativan, will taper down and continue hydroxyzine   DVT prophylaxis: Lovenox Code Status:   Code Status: Full Code Family Communication: None at bedside Disposition Plan: Discharge to behavioral health hospital when bacteremia treated.   Consultants:   Infectious disease  Cardiology  Procedures:   None  Antimicrobials:  Cefazolin    Subjective: Feels anxious, slightly improved. Unhappy that she was not able to have her visitor  today.  Objective: Vitals:   09/24/17 2012 09/25/17 0603 09/25/17 2239 09/26/17 0606  BP: 120/82 117/88 109/77 106/75  Pulse: 81 (!) 104 72 90  Resp: (!) 25 (!) 22 16 16   Temp: 98 F (36.7 C) 98.3 F (36.8 C) 98.5 F (36.9 C) 98.1 F (36.7 C)  TempSrc: Oral Oral Oral Oral  SpO2: 100% 100% 100% 100%  Weight: 49.9 kg (110 lb)     Height: 5\' 1"  (1.549 m)       Intake/Output Summary (Last 24 hours) at 09/26/2017 1223 Last data filed at 09/26/2017 1200 Gross per 24 hour  Intake 1984 ml  Output 1350 ml  Net 634 ml   Filed Weights   09/22/17 0441 09/23/17 0430 09/24/17 2012  Weight: 46.3 kg (102 lb) 46.9 kg (103 lb 6.3 oz) 49.9 kg (110 lb)    Examination:  General: Well appearing, no distress Respiratory: Unlabored work of breathing.  Central nervous system: alert, oriented, no focal deficit Psychiatry: No suicidal ideation, slightly flat affect, depressed mood, teary eyed.    Data Reviewed: I have personally reviewed following labs and imaging studies  CBC: Recent Labs  Lab 09/20/17 0758 09/21/17 0229 09/22/17 0536 09/23/17 0547  WBC 13.2* 10.7* 12.0* 7.0  NEUTROABS 9.7*  --  8.3*  --   HGB 10.1* 10.0* 10.5* 9.7*  HCT 31.9* 32.0* 33.9* 32.0*  MCV 76.9* 76.2* 77.0* 77.1*  PLT 474* 491* 520* 716*   Basic Metabolic Panel: Recent Labs  Lab 09/20/17 0758 09/21/17 0229 09/22/17 0536 09/23/17 0547  NA 136 136 136 138  K 3.9 3.6 3.2* 3.9  CL 104 106 106 107  CO2 21* 20* 21* 22  GLUCOSE 115* 112* 120* 117*  BUN 7 7 5* 8  CREATININE 0.44 0.46 0.42* 0.41*  CALCIUM 8.6* 8.6* 8.6* 8.6*  GFR: Estimated Creatinine Clearance: 77.6 mL/min (A) (by C-G formula based on SCr of 0.41 mg/dL (L)). Liver Function Tests: Recent Labs  Lab 09/20/17 0758  AST 19  ALT 24  ALKPHOS 97  BILITOT 0.1*  PROT 7.8  ALBUMIN 3.2*   No results for input(s): LIPASE, AMYLASE in the last 168 hours. No results for input(s): AMMONIA in the last 168 hours. Coagulation Profile: No  results for input(s): INR, PROTIME in the last 168 hours. Cardiac Enzymes: No results for input(s): CKTOTAL, CKMB, CKMBINDEX, TROPONINI in the last 168 hours. BNP (last 3 results) No results for input(s): PROBNP in the last 8760 hours. HbA1C: No results for input(s): HGBA1C in the last 72 hours. CBG: No results for input(s): GLUCAP in the last 168 hours. Lipid Profile: No results for input(s): CHOL, HDL, LDLCALC, TRIG, CHOLHDL, LDLDIRECT in the last 72 hours. Thyroid Function Tests: No results for input(s): TSH, T4TOTAL, FREET4, T3FREE, THYROIDAB in the last 72 hours. Anemia Panel: No results for input(s): VITAMINB12, FOLATE, FERRITIN, TIBC, IRON, RETICCTPCT in the last 72 hours. Sepsis Labs: No results for input(s): PROCALCITON, LATICACIDVEN in the last 168 hours.  Recent Results (from the past 240 hour(s))  Culture, blood (routine x 2)     Status: Abnormal   Collection Time: 09/20/17  8:49 AM  Result Value Ref Range Status   Specimen Description BLOOD LEFT HAND  Final   Special Requests   Final    BOTTLES DRAWN AEROBIC AND ANAEROBIC Blood Culture adequate volume   Culture  Setup Time   Final    GRAM POSITIVE COCCI IN BOTH AEROBIC AND ANAEROBIC BOTTLES CRITICAL VALUE NOTED.  VALUE IS CONSISTENT WITH PREVIOUSLY REPORTED AND CALLED VALUE.    Culture (A)  Final    STAPHYLOCOCCUS AUREUS SUSCEPTIBILITIES PERFORMED ON PREVIOUS CULTURE WITHIN THE LAST 5 DAYS. Performed at [facility], [place] [address], [place], [place] [address]    Report Status 09/23/2017 FINAL  Final  Culture, blood (routine x 2)     Status: Abnormal   Collection Time: 09/20/17  9:04 AM  Result Value Ref Range Status   Specimen Description BLOOD LEFT ANTECUBITAL  Final   Special Requests   Final    BOTTLES DRAWN AEROBIC AND ANAEROBIC Blood Culture adequate volume   Culture  Setup Time   Final    GRAM POSITIVE COCCI IN BOTH AEROBIC AND ANAEROBIC BOTTLES Organism ID to follow CRITICAL RESULT CALLED TO,  READ BACK BY AND VERIFIED WITH: Karsten Ro [facility] 09/21/17 0056 JDW Performed at [facility], [place] [address], [place],  [address]    Culture STAPHYLOCOCCUS AUREUS (A)  Final   Report Status 09/22/2017 FINAL  Final   Organism ID, Bacteria STAPHYLOCOCCUS AUREUS  Final      Susceptibility   Staphylococcus aureus - MIC*    CIPROFLOXACIN <=0.5 SENSITIVE Sensitive     ERYTHROMYCIN <=0.25 SENSITIVE Sensitive     GENTAMICIN <=0.5 SENSITIVE Sensitive     OXACILLIN 0.5 SENSITIVE Sensitive     TETRACYCLINE <=1 SENSITIVE Sensitive     VANCOMYCIN <=0.5 SENSITIVE Sensitive     TRIMETH/SULFA <=10 SENSITIVE Sensitive     CLINDAMYCIN <=0.25 SENSITIVE Sensitive     RIFAMPIN <=0.5 SENSITIVE Sensitive     Inducible Clindamycin NEGATIVE Sensitive     * STAPHYLOCOCCUS AUREUS  Blood Culture ID Panel (Reflexed)     Status: Abnormal   Collection Time: 09/20/17  9:04 AM  Result Value Ref Range Status   Enterococcus species NOT  DETECTED NOT DETECTED Final   Listeria monocytogenes NOT DETECTED NOT DETECTED Final   Staphylococcus species DETECTED (A) NOT DETECTED Final    Comment: CRITICAL RESULT CALLED TO, READ BACK BY AND VERIFIED WITH: J LEDFORD PHARMD 09/21/17 0056 JDW    Staphylococcus aureus DETECTED (A) NOT DETECTED Final    Comment: Methicillin (oxacillin) susceptible Staphylococcus aureus (MSSA). Preferred therapy is anti staphylococcal beta lactam antibiotic (Cefazolin or Nafcillin), unless clinically contraindicated. CRITICAL RESULT CALLED TO, READ BACK BY AND VERIFIED WITH: J LEDFORD [facility] 09/21/17 0056 JDW    Methicillin resistance NOT DETECTED NOT DETECTED Final   Streptococcus species NOT DETECTED NOT DETECTED Final   Streptococcus agalactiae NOT DETECTED NOT DETECTED Final   Streptococcus pneumoniae NOT DETECTED NOT DETECTED Final   Streptococcus pyogenes NOT DETECTED NOT DETECTED Final   Acinetobacter baumannii NOT DETECTED NOT DETECTED Final   Enterobacteriaceae species NOT  DETECTED NOT DETECTED Final   Enterobacter cloacae complex NOT DETECTED NOT DETECTED Final   Escherichia coli NOT DETECTED NOT DETECTED Final   Klebsiella oxytoca NOT DETECTED NOT DETECTED Final   Klebsiella pneumoniae NOT DETECTED NOT DETECTED Final   Proteus species NOT DETECTED NOT DETECTED Final   Serratia marcescens NOT DETECTED NOT DETECTED Final   Haemophilus influenzae NOT DETECTED NOT DETECTED Final   Neisseria meningitidis NOT DETECTED NOT DETECTED Final   Pseudomonas aeruginosa NOT DETECTED NOT DETECTED Final   Candida albicans NOT DETECTED NOT DETECTED Final   Candida glabrata NOT DETECTED NOT DETECTED Final   Candida krusei NOT DETECTED NOT DETECTED Final   Candida parapsilosis NOT DETECTED NOT DETECTED Final   Candida tropicalis NOT DETECTED NOT DETECTED Final    Comment: Performed at [facility], [place]. [address] [place], [place] [address]  Culture, blood (single)     Status: None (Preliminary result)   Collection Time: 09/21/17  8:10 AM  Result Value Ref Range Status   Specimen Description BLOOD HAND  Final   Special Requests   Final    BOTTLES DRAWN AEROBIC ONLY Blood Culture adequate volume   Culture   Final    NO GROWTH 4 DAYS Performed at [facility], [place] [address] [place], [place] [address]    Report Status PENDING  Incomplete  MRSA PCR Screening     Status: None   Collection Time: 09/23/17  4:28 AM  Result Value Ref Range Status   MRSA by PCR NEGATIVE NEGATIVE Final    Comment:        The GeneXpert MRSA Assay (FDA approved for NASAL specimens only), is one component of a comprehensive MRSA colonization surveillance program. It is not intended to diagnose MRSA infection nor to guide or monitor treatment for MRSA infections. Performed at [facility], [place] [address] [place], [place] [address]          Radiology Studies: No results found.      Scheduled Meds: . buprenorphine-naloxone  1 tablet Sublingual BID  .  cloNIDine  0.1 mg Oral QID   Followed by  . [START ON 09/27/2017] cloNIDine  0.1 mg Oral BH-qamhs   Followed by  . [START ON 09/29/2017] cloNIDine  0.1 mg Oral QAC breakfast  . enoxaparin (LOVENOX) injection  40 mg Subcutaneous Q24H  . gabapentin  200 mg Oral TID  . sodium chloride flush  3 mL Intravenous Q12H   Continuous Infusions: . sodium chloride 10 mL/hr at 09/24/17 2116  . sodium chloride 20 mL/hr at 09/26/17 0000  .  ceFAZolin (ANCEF) IV Stopped (09/26/17 3958)     LOS: 4 days     Cordelia Poche, MD [facility] 09/26/2017, 12:23 PM Pager: [phone]  If 7PM-7AM, please contact night-coverage [email or web address] Password [facility] 09/26/2017, 12:23 PM

## 2017-09-26 NOTE — Anesthesia Preprocedure Evaluation (Addendum)
Anesthesia Evaluation  Patient identified by MRN, date of birth, ID band Patient awake    Reviewed: Allergy & Precautions, NPO status , Patient's Chart, lab work & pertinent test results  Airway Mallampati: II  TM Distance: >3 FB Neck ROM: Full    Dental   Pulmonary Current Smoker,    breath sounds clear to auscultation       Cardiovascular  Rhythm:Regular Rate:Normal  Hx endocarditis   Neuro/Psych Anxiety Depression negative neurological ROS     GI/Hepatic (+)     substance abuse  IV drug use, Hepatitis -, C  Endo/Other  negative endocrine ROS  Renal/GU negative Renal ROS     Musculoskeletal   Abdominal   Peds  Hematology  (+) anemia ,   Anesthesia Other Findings   Reproductive/Obstetrics                           Lab Results  Component Value Date   WBC 7.0 09/23/2017   HGB 9.7 (L) 09/23/2017   HCT 32.0 (L) 09/23/2017   MCV 77.1 (L) 09/23/2017   PLT 555 (H) 09/23/2017   Lab Results  Component Value Date   CREATININE 0.41 (L) 09/23/2017   BUN 8 09/23/2017   NA 138 09/23/2017   K 3.9 09/23/2017   CL 107 09/23/2017   CO2 22 09/23/2017    Anesthesia Physical Anesthesia Plan  ASA: III  Anesthesia Plan: MAC   Post-op Pain Management:    Induction: Intravenous  PONV Risk Score and Plan: 1 and Ondansetron, Propofol infusion and Treatment may vary due to age or medical condition  Airway Management Planned: Natural Airway and Nasal Cannula  Additional Equipment:   Intra-op Plan:   Post-operative Plan:   Informed Consent: I have reviewed the patients History and Physical, chart, labs and discussed the procedure including the risks, benefits and alternatives for the proposed anesthesia with the patient or authorized representative who has indicated his/her understanding and acceptance.     Plan Discussed with: CRNA  Anesthesia Plan Comments:        Anesthesia  Quick Evaluation

## 2017-09-27 ENCOUNTER — Inpatient Hospital Stay (HOSPITAL_COMMUNITY): Payer: Self-pay | Admitting: Anesthesiology

## 2017-09-27 ENCOUNTER — Encounter (HOSPITAL_COMMUNITY)
Admission: EM | Discharge status: Against Medical Advice | Payer: Self-pay | Source: Home / Self Care | Attending: Family Medicine

## 2017-09-27 ENCOUNTER — Inpatient Hospital Stay (HOSPITAL_COMMUNITY): Payer: Self-pay

## 2017-09-27 ENCOUNTER — Encounter (HOSPITAL_COMMUNITY): Payer: Self-pay

## 2017-09-27 DIAGNOSIS — I38 Endocarditis, valve unspecified: Secondary | ICD-10-CM

## 2017-09-27 DIAGNOSIS — F1120 Opioid dependence, uncomplicated: Secondary | ICD-10-CM

## 2017-09-27 DIAGNOSIS — I330 Acute and subacute infective endocarditis: Secondary | ICD-10-CM

## 2017-09-27 HISTORY — PX: TEE WITHOUT CARDIOVERSION: SHX5443

## 2017-09-27 LAB — CBC
HEMATOCRIT: 36.6 % (ref 36.0–46.0)
Hemoglobin: 11 g/dL — ABNORMAL LOW (ref 12.0–15.0)
MCH: 23.5 pg — AB (ref 26.0–34.0)
MCHC: 30.1 g/dL (ref 30.0–36.0)
MCV: 78.2 fL (ref 78.0–100.0)
Platelets: 677 10*3/uL — ABNORMAL HIGH (ref 150–400)
RBC: 4.68 MIL/uL (ref 3.87–5.11)
RDW: 15.7 % — AB (ref 11.5–15.5)
WBC: 9.4 10*3/uL (ref 4.0–10.5)

## 2017-09-27 LAB — BASIC METABOLIC PANEL
ANION GAP: 8 (ref 5–15)
BUN: 12 mg/dL (ref 6–20)
CALCIUM: 9.1 mg/dL (ref 8.9–10.3)
CO2: 23 mmol/L (ref 22–32)
CREATININE: 0.51 mg/dL (ref 0.44–1.00)
Chloride: 105 mmol/L (ref 101–111)
GFR calc Af Amer: >60 mL/min (ref >60)
GFR calc non Af Amer: >60 mL/min (ref >60)
GLUCOSE: 85 mg/dL (ref 65–99)
Potassium: 4.3 mmol/L (ref 3.5–5.1)
Sodium: 136 mmol/L (ref 135–145)

## 2017-09-27 SURGERY — ECHOCARDIOGRAM, TRANSESOPHAGEAL
Anesthesia: Monitor Anesthesia Care

## 2017-09-27 MED ORDER — PROPOFOL 500 MG/50ML IV EMUL
INTRAVENOUS | Status: DC | PRN
  Administered 2017-09-27: 200 ug/kg/min via INTRAVENOUS

## 2017-09-27 MED ORDER — METHOCARBAMOL 500 MG PO TABS
500.0000 mg | ORAL_TABLET | Freq: Three times a day (TID) | ORAL | Status: DC | PRN
  Administered 2017-09-27 – 2017-09-29 (×2): 500 mg via ORAL
  Filled 2017-09-27 (×2): qty 1

## 2017-09-27 MED ORDER — PROPOFOL 10 MG/ML IV BOLUS
INTRAVENOUS | Status: DC | PRN
  Administered 2017-09-27: 30 mg via INTRAVENOUS
  Administered 2017-09-27: 40 mg via INTRAVENOUS
  Administered 2017-09-27: 25 mg via INTRAVENOUS
  Administered 2017-09-27: 40 mg via INTRAVENOUS

## 2017-09-27 MED ORDER — LIDOCAINE 2% (20 MG/ML) 5 ML SYRINGE
INTRAMUSCULAR | Status: DC | PRN
  Administered 2017-09-27: 60 mg via INTRAVENOUS

## 2017-09-27 MED ORDER — VENLAFAXINE HCL ER 75 MG PO CP24
75.0000 mg | ORAL_CAPSULE | Freq: Every day | ORAL | Status: DC
  Administered 2017-09-28 – 2017-10-02 (×5): 75 mg via ORAL
  Filled 2017-09-27 (×5): qty 1

## 2017-09-27 MED ORDER — SODIUM CHLORIDE 0.9 % IV SOLN: INTRAVENOUS | Status: DC

## 2017-09-27 NOTE — H&P (Addendum)
   INTERVAL PROCEDURE H&P  History and Physical Interval Note:  09/27/2017 7:47 AM  I personally reviewed the patient's prior echocardiogram as well as recent MRI of the cervical spine.  The radiologist commented on "prevertebral edema" as well as some cervical lymph node enlargement, however no clear evidence of osteomyelitis, discitis or retropharyngeal abscess was noted. The patient was examined by myself and Dr. Suzette Battiest.  She is not currently having any cervical cord symptoms and neurosurgery has not been consulted.  She has full range of motion in her neck.  I feel that she is at acceptable risk to proceed with transesophageal echocardiogram.  Tria Noguera has presented today for their planned procedure. The various methods of treatment have been discussed with the patient and family. After consideration of risks, benefits and other options for treatment, the patient has consented to the procedure.  The patients' outpatient history has been reviewed, patient examined, and no change in status from most recent office note within the past 30 days. I have reviewed the patients' chart and labs and will proceed as planned. Questions were answered to the patient's satisfaction.   Pixie Casino, MD, [facility], [place] Director of the [facility]  [facility] Diplomate of the American Board of Clinical Lipidology Attending Cardiologist  Direct Dial: [phone]  Fax: [phone]  Website:  [email or web address] 09/27/2017, 7:47 AM

## 2017-09-27 NOTE — Progress Notes (Signed)
0600 am dose of antibiotic not given because patient requested IV be taken out due to burning, pain, and redness. Patient sent to have TEE done and will receive new IV at procedure.

## 2017-09-27 NOTE — Transfer of Care (Signed)
Immediate Anesthesia Transfer of Care Note  Patient: Sherri Francis  Procedure(s) Performed: TRANSESOPHAGEAL ECHOCARDIOGRAM (TEE) (N/A )  Patient Location: PACU  Anesthesia Type:MAC  Level of Consciousness: awake, alert  and oriented  Airway & Oxygen Therapy: Patient Spontanous Breathing  Post-op Assessment: Report given to RN and Post -op Vital signs reviewed and stable  Post vital signs: Reviewed and stable  Last Vitals:  Vitals Value Taken Time  BP 110/73 09/27/2017  8:34 AM  Temp 36.7 C 09/27/2017  8:34 AM  Pulse 98 09/27/2017  8:36 AM  Resp 24 09/27/2017  8:36 AM  SpO2 99 % 09/27/2017  8:36 AM  Vitals shown include unvalidated device data.  Last Pain:  Vitals:   09/27/17 0834  TempSrc: Oral  PainSc:       Patients Stated Pain Goal: 3 (57/84/69 [id number])  Complications: No apparent anesthesia complications

## 2017-09-27 NOTE — Anesthesia Postprocedure Evaluation (Signed)
Anesthesia Post Note  Patient: Sherri Francis  Procedure(s) Performed: TRANSESOPHAGEAL ECHOCARDIOGRAM (TEE) (N/A )     Patient location during evaluation: PACU Anesthesia Type: MAC Level of consciousness: awake and alert Pain management: pain level controlled Vital Signs Assessment: post-procedure vital signs reviewed and stable Respiratory status: spontaneous breathing, nonlabored ventilation, respiratory function stable and patient connected to nasal cannula oxygen Cardiovascular status: stable and blood pressure returned to baseline Postop Assessment: no apparent nausea or vomiting Anesthetic complications: no    Last Vitals:  Vitals:   09/27/17 0844 09/27/17 0857  BP: 104/60 98/67  Pulse: 69 77  Resp: 18 18  Temp:  36.5 C  SpO2: 100% 100%    Last Pain:  Vitals:   09/27/17 0857  TempSrc: Oral  PainSc:                  [name]

## 2017-09-27 NOTE — Progress Notes (Signed)
  Echocardiogram Echocardiogram Transesophageal has been performed.  Sherri Francis 09/27/2017, 9:01 AM

## 2017-09-27 NOTE — Consult Note (Signed)
Sherri Francis Psych Consult Progress Note  09/27/2017 11:00 AM Sherri Francis  MRN:  [id number] Subjective:   Sherri Francis was last seen on 4/18 for SI and capacity evaluation. She demonstrated capacity to make medical decisions regarding current care. She was recommended for inpatient psychiatric hospitalization due to recently endorsing statements that she did not want to live and concern by her mother that she was unsafe for discharge.   On interview, Sherri Francis is brighter in affect and more motivated about receiving substance abuse treatment today.  She becomes emotional when discussing her struggles with maintaining sobriety.  She reports that she has not received rehab treatment since 2014.  She reports feeling older than her age.  She does not want to die from complications from her medical condition.  She denies SI, HI or AVH.  She would like to restart the Effexor because it was helpful for her in the past.  She reports abruptly discontinuing it which resulted in brain zaps.  She denies withdrawal symptoms today.  Principal Problem: Anxiety and depression Diagnosis:   Patient Active Problem List   Diagnosis Date Noted  . Anxiety and depression [F41.9, F32.9]   . Osteomyelitis ([place]) [M86.9] 09/22/2017  . IV drug user [F19.90]   . Acute osteomyelitis of cervical spine ([facility]) [M46.22] 09/20/2017  . Opioid abuse with opioid-induced mood disorder ([place]) [F11.14] 02/21/2017  . Fever [R50.9] 02/20/2017  . Anemia [D64.9] 02/20/2017  . 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 Substance induced mood disorder ([place]) [F19.94] 01/30/2017  . Opioid type dependence, continuous ([place]) [F11.20] 01/30/2017  . 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 Bacteremia due to Staphylococcus aureus [R78.81] 01/27/2017  . Pneumonia [J18.9] 01/24/2017  . Pelvic abscess in female [N73.9] 01/13/2016  . PID (acute pelvic  inflammatory disease) [N73.0] 01/13/2016   Total Time spent with patient: 15 minutes  Past Psychiatric History: Opiate use disorder, anxiety and depression.  Past Medical History:  Past Medical History:  Diagnosis Date  . Anemia   . Anxiety   . Depression   . Endocarditis   . Hepatitis C   . IVDU (intravenous drug user)   . PID (acute pelvic inflammatory disease)   . Suicide attempt [facility])    multiple attempts  . Teratoma   . UTI (urinary tract infection)     Past Surgical History:  Procedure Laterality Date  . LAPAROSCOPY     Family History:  Family History  Problem Relation Age of Onset  . Heart disease Maternal Grandfather   . Anxiety disorder Maternal Grandfather   . Heart disease Maternal Aunt   . Alcohol abuse Father   . Drug abuse Father   . Cancer Neg Hx    Family Psychiatric  History: As listed above.  Social History:  Social History   Substance and Sexual Activity  Alcohol Use Yes   Comment: 6 beers perday- 2 years ago was last time hx of alcoholism per pt.     Social History   Substance and Sexual Activity  Drug Use Yes  . Types: IV   Comment: heroin, last 2 days prior to admission    Social History   Socioeconomic History  . Marital status: Single    Spouse name: Not on file  . Number of children: Not on file  . Years of education: Not on file  . Highest education level: Not on file  Occupational History  . Not on  file  Social Needs  . Financial resource strain: Not on file  . Food insecurity:    Worry: Not on file    Inability: Not on file  . Transportation needs:    Medical: Not on file    Non-medical: Not on file  Tobacco Use  . Smoking status: Current Every Day Smoker    Packs/day: 0.50    Types: Cigarettes  . Smokeless tobacco: Never Used  Substance and Sexual Activity  . Alcohol use: Yes    Comment: 6 beers perday- 2 years ago was last time hx of alcoholism per pt.  . Drug use: Yes    Types: IV    Comment: heroin, last 2  days prior to admission  . Sexual activity: Not Currently  Lifestyle  . Physical activity:    Days per week: Not on file    Minutes per session: Not on file  . Stress: Not on file  Relationships  . Social connections:    Talks on phone: Not on file    Gets together: Not on file    Attends religious service: Not on file    Active member of club or organization: Not on file    Attends meetings of clubs or organizations: Not on file    Relationship status: Not on file  Other Topics Concern  . Not on file  Social History Narrative  . Not on file    Sleep: Good  Appetite:  Good  Current Medications: Current Facility-Administered Medications  Medication Dose Route Frequency Provider Last Rate Last Dose  . 0.9 %  sodium chloride infusion   Intravenous Continuous Pixie Casino, MD 10 mL/hr at 09/24/17 2116    . acetaminophen (TYLENOL) tablet 650 mg  650 mg Oral Q6H PRN Pixie Casino, MD   650 mg at 09/22/17 2344   Or  . acetaminophen (TYLENOL) suppository 650 mg  650 mg Rectal Q6H PRN Pixie Casino, MD      . buprenorphine-naloxone (SUBOXONE) 8-2 mg per SL tablet 1 tablet  1 tablet Sublingual BID Pixie Casino, MD   1 tablet at 09/26/17 1803  . ceFAZolin (ANCEF) IVPB 2g/100 mL premix  2 g Intravenous Q8H Hilty, Nadean Corwin, MD   Stopped at 09/26/17 2227  . cloNIDine (CATAPRES) tablet 0.1 mg  0.1 mg Oral BH-qamhs Hilty, Nadean Corwin, MD   0.1 mg at 09/27/17 1000   Followed by  . [START ON 09/29/2017] cloNIDine (CATAPRES) tablet 0.1 mg  0.1 mg Oral QAC breakfast Hilty, Nadean Corwin, MD      . enoxaparin (LOVENOX) injection 40 mg  40 mg Subcutaneous Q24H Pixie Casino, MD   40 mg at 09/23/17 2012  . gabapentin (NEURONTIN) capsule 200 mg  200 mg Oral TID Pixie Casino, MD   200 mg at 09/27/17 0951  . hydrOXYzine (ATARAX/VISTARIL) tablet 25 mg  25 mg Oral Q6H PRN Pixie Casino, MD      . ondansetron (ZOFRAN) tablet 4 mg  4 mg Oral Q6H PRN Pixie Casino, MD       Or  .  ondansetron (ZOFRAN) injection 4 mg  4 mg Intravenous Q6H PRN Pixie Casino, MD   4 mg at 09/24/17 1528  . sodium chloride flush (NS) 0.9 % injection 3 mL  3 mL Intravenous Q12H Hilty, Nadean Corwin, MD   3 mL at 09/27/17 5573    Lab Results:  Results for orders placed or performed during the hospital encounter of  09/22/17 (from the past 48 hour(s))  CBC     Status: Abnormal   Collection Time: 09/27/17  6:23 AM  Result Value Ref Range   WBC 9.4 4.0 - 10.5 K/uL   RBC 4.68 3.87 - 5.11 MIL/uL   Hemoglobin 11.0 (L) 12.0 - 15.0 g/dL   HCT 36.6 36.0 - 46.0 %   MCV 78.2 78.0 - 100.0 fL   MCH 23.5 (L) 26.0 - 34.0 pg   MCHC 30.1 30.0 - 36.0 g/dL   RDW 15.7 (H) 11.5 - 15.5 %   Platelets 677 (H) 150 - 400 K/uL    Comment: Performed at [facility] [address] [place], [place] [address]  Basic metabolic panel     Status: None   Collection Time: 09/27/17  6:23 AM  Result Value Ref Range   Sodium 136 135 - 145 mmol/L   Potassium 4.3 3.5 - 5.1 mmol/L   Chloride 105 101 - 111 mmol/L   CO2 23 22 - 32 mmol/L   Glucose, Bld 85 65 - 99 mg/dL   BUN 12 6 - 20 mg/dL   Creatinine, Ser 0.51 0.44 - 1.00 mg/dL   Calcium 9.1 8.9 - 10.3 mg/dL   GFR calc non Af Amer >60 >60 mL/min   GFR calc Af Amer >60 >60 mL/min    Comment: (NOTE) The eGFR has been calculated using the CKD EPI equation. This calculation has not been validated in all clinical situations. eGFR's persistently <60 mL/min signify possible Chronic Kidney Disease.    Anion gap 8 5 - 15    Comment: Performed at [facility] [address] [place],  [address]    Blood Alcohol level:  Lab Results  Component Value Date   [place] <5 01/27/2017   [facility] <11 10/15/2012   Musculoskeletal: Strength & Muscle Tone: within normal limits Gait & Station: UTA since patient was lying down in bed. Patient leans: N/A  Psychiatric Specialty Exam: Physical Exam  Nursing note and vitals reviewed. Constitutional: She is oriented to  person, place, and time. She appears well-developed and well-nourished.  HENT:  Head: Normocephalic and atraumatic.  Neck: Normal range of motion.  Respiratory: Effort normal.  Musculoskeletal: Normal range of motion.  Neurological: She is alert and oriented to person, place, and time.  Skin: No rash noted.  Psychiatric: She has a normal mood and affect. Her speech is normal and behavior is normal. Judgment and thought content normal. Cognition and memory are normal.    Review of Systems  Psychiatric/Behavioral: Positive for depression and substance abuse. Negative for hallucinations and suicidal ideas. The patient is nervous/anxious. The patient does not have insomnia.   All other systems reviewed and are negative.   Blood pressure 98/67, pulse 77, temperature 97.7 F (36.5 C), temperature source Oral, resp. rate 18, height '5\' 1"'$  (1.549 m), weight 49.9 kg (110 lb), last menstrual period 03/08/2017, SpO2 100 %.Body mass index is 20.78 kg/m.  General Appearance: Fairly Groomed, young, Caucasian female, wearing a hospital gown with dyed brunette hair and lying in bed. NAD.   Eye Contact:  Good  Speech:  Clear and Coherent and Normal Rate  Volume:  Normal  Mood:  "Good"  Affect:  Appropriate and Full Range  Thought Process:  Goal Directed, Linear and Descriptions of Associations: Intact  Orientation:  Full (Time, Place, and Person)  Thought Content:  Logical  Suicidal Thoughts:  No  Homicidal Thoughts:  No  Memory:  Immediate;  Good Recent;   Good Remote;   Good  Judgement:  Fair  Insight:  Fair  Psychomotor Activity:  Normal  Concentration:  Concentration: Good and Attention Span: Good  Recall:  Good  Fund of Knowledge:  Good  Language:  Good  Akathisia:  No  Handed:  Right  AIMS (if indicated):   N/A  Assets:  Communication Skills Desire for Improvement Housing Social Support  ADL's:  Intact  Cognition:  WNL  Sleep:   Okay   Assessment:  Sherri Francis is a 32 y.o.  female who was admitted with osteomyelitis of cervical spine. Psychiatry was consulted due to Ashley. Patient denies SI, HI or AVH today. She appears brighter and more motivated to complete rehab treatment following discharge from the hospital. Her mother denies concerns for safety. Patient and mother agree that she may benefit from speaking to a psychologist regarding her current stressors and struggles with abstinence from drugs. She is agreeable to restarting Effexor for mood and anxiety. She does not warrant inpatient psychiatric hospitalization at this time.    Treatment Plan Summary: -Continue Gabapentin 200 mg TID for anxiety.  -Restart Effexor XR 75 mg daily for mood and anxiety.  -Recommend referring patient to speak to inpatient psychologist given prolonged hospitalization for medical condition. She may benefit from developing healthy coping skills and ways to manage current struggles with maintaining sobriety.   -Patient is psychiatrically cleared. Will sign off on patient at this time. Please consult psychiatry again as needed.     Faythe Dingwall, DO 09/27/2017, 11:00 AM

## 2017-09-27 NOTE — Progress Notes (Signed)
PROGRESS NOTE    [name]  WIO:[id number] DOB: 1985/08/16 DOA: 09/22/2017 PCP: Patient, No Pcp Per   Brief Narrative: Sherri Francis is a 32 y.o. female with a history of polysubstance/IV drug use, hepatitis C, MSSA endocarditis. She presented after leaving AMA multiple times in the last 8 months for incomplete treatment of her MSSA endocarditis. She is currently IVCd with recommendations for inpatient behavioral health admission. Patient refusing suboxone therapy. Clonidine taper for withdrawal.   Assessment & Plan:   Principal Problem:   Anxiety and depression Active Problems:   Bacteremia due to Staphylococcus aureus   Opioid type dependence, continuous (HCC)   Acute osteomyelitis of cervical spine (HCC)   Osteomyelitis (HCC)   ?Acute osteomyelitis of cervical spine Bacteremia/Endocarditis secondary to MSSA Patient recently left AMA. Currently IVCd. Repeat blood culture from 4/16 (single), no growth. MRI suggests possible infection but no definite osteomyelitis. Transesophageal Echocardiogram performed and significant for resolution of prior TV vegetation. No new vegetation. PFO noted. -Continue cefazolin -Infectious disease recommendations: Cefazolin  Suicidal ideation IVC. Psychiatry recommending inpatient psych. Started on Effexor. -Re-consult psychiatry today to reassess need for inpatient psych admission  Opiate dependence Polysubstance abuse Patient desires abstinence from IV drug use. Feels good today. -Clonidine taper -symptom control with atarax, robaxin, naproxen -suboxone -started on ativan, tapered down and continue hydroxyzine  PFO Seen on Transesophageal Echocardiogram. Small.   DVT prophylaxis: Lovenox Code Status:   Code Status: Full Code Family Communication: Mother at bedside Disposition Plan: Discharge to behavioral health hospital when bacteremia treated.   Consultants:   Infectious disease  Cardiology  Procedures:   Transesophageal  Echocardiogram IMPRESSION:   1. No evidence for endocarditis. The previously noted TV vegetation has resolved. 2. No LAA thrombus 3. Very small PFO noted with saline microbubble contrast 4. LVEF 55-60%     Antimicrobials:  Cefazolin    Subjective: Feels much less anxious. Feels good today.  Objective: Vitals:   09/27/17 0719 09/27/17 0834 09/27/17 0844 09/27/17 0857  BP:  110/73 104/60 98/67  Pulse: 97 86 69 77  Resp: 16 10 18 18   Temp: 98.8 F (37.1 C) 98 F (36.7 C)  97.7 F (36.5 C)  TempSrc: Oral Oral  Oral  SpO2: 100% 100% 100% 100%  Weight: 49.9 kg (110 lb)     Height: 5\' 1"  (1.549 m)       Intake/Output Summary (Last 24 hours) at 09/27/2017 1009 Last data filed at 09/27/2017 0830 Gross per 24 hour  Intake 800 ml  Output 1150 ml  Net -350 ml   Filed Weights   09/23/17 0430 09/24/17 2012 09/27/17 0719  Weight: 46.9 kg (103 lb 6.3 oz) 49.9 kg (110 lb) 49.9 kg (110 lb)    Examination:  General: Well appearing, no distress Respiratory: Unlabored. Gastroenterology: non-distended Central nervous system: Alert, oriented, no focal deficit. Psychiatry: No suicidal ideation. Full affect and normal mood today. Appears happy.     Data Reviewed: I have personally reviewed following labs and imaging studies  CBC: Recent Labs  Lab 09/21/17 0229 09/22/17 0536 09/23/17 0547 09/27/17 0623  WBC 10.7* 12.0* 7.0 9.4  NEUTROABS  --  8.3*  --   --   HGB 10.0* 10.5* 9.7* 11.0*  HCT 32.0* 33.9* 32.0* 36.6  MCV 76.2* 77.0* 77.1* 78.2  PLT 491* 520* 555* 426*   Basic Metabolic Panel: Recent Labs  Lab 09/21/17 0229 09/22/17 0536 09/23/17 0547 09/27/17 0623  NA 136 136 138 136  K 3.6 3.2* 3.9 4.3  CL 106 106 107 105  CO2 20* 21* 22 23  GLUCOSE 112* 120* 117* 85  BUN 7 5* 8 12  CREATININE 0.46 0.42* 0.41* 0.51  CALCIUM 8.6* 8.6* 8.6* 9.1   GFR: Estimated Creatinine Clearance: 77.6 mL/min (by C-G formula based on SCr of 0.51 mg/dL). Liver Function  Tests: No results for input(s): AST, ALT, ALKPHOS, BILITOT, PROT, ALBUMIN in the last 168 hours. No results for input(s): LIPASE, AMYLASE in the last 168 hours. No results for input(s): AMMONIA in the last 168 hours. Coagulation Profile: No results for input(s): INR, PROTIME in the last 168 hours. Cardiac Enzymes: No results for input(s): CKTOTAL, CKMB, CKMBINDEX, TROPONINI in the last 168 hours. BNP (last 3 results) No results for input(s): PROBNP in the last 8760 hours. HbA1C: No results for input(s): HGBA1C in the last 72 hours. CBG: No results for input(s): GLUCAP in the last 168 hours. Lipid Profile: No results for input(s): CHOL, HDL, LDLCALC, TRIG, CHOLHDL, LDLDIRECT in the last 72 hours. Thyroid Function Tests: No results for input(s): TSH, T4TOTAL, FREET4, T3FREE, THYROIDAB in the last 72 hours. Anemia Panel: No results for input(s): VITAMINB12, FOLATE, FERRITIN, TIBC, IRON, RETICCTPCT in the last 72 hours. Sepsis Labs: No results for input(s): PROCALCITON, LATICACIDVEN in the last 168 hours.  Recent Results (from the past 240 hour(s))  Culture, blood (routine x 2)     Status: Abnormal   Collection Time: 09/20/17  8:49 AM  Result Value Ref Range Status   Specimen Description BLOOD LEFT HAND  Final   Special Requests   Final    BOTTLES DRAWN AEROBIC AND ANAEROBIC Blood Culture adequate volume   Culture  Setup Time   Final    GRAM POSITIVE COCCI IN BOTH AEROBIC AND ANAEROBIC BOTTLES CRITICAL VALUE NOTED.  VALUE IS CONSISTENT WITH PREVIOUSLY REPORTED AND CALLED VALUE.    Culture (A)  Final    STAPHYLOCOCCUS AUREUS SUSCEPTIBILITIES PERFORMED ON PREVIOUS CULTURE WITHIN THE LAST 5 DAYS. Performed at [facility], [place] [address], [place], [place] [address]    Report Status 09/23/2017 FINAL  Final  Culture, blood (routine x 2)     Status: Abnormal   Collection Time: 09/20/17  9:04 AM  Result Value Ref Range Status   Specimen Description BLOOD LEFT ANTECUBITAL  Final    Special Requests   Final    BOTTLES DRAWN AEROBIC AND ANAEROBIC Blood Culture adequate volume   Culture  Setup Time   Final    GRAM POSITIVE COCCI IN BOTH AEROBIC AND ANAEROBIC BOTTLES Organism ID to follow CRITICAL RESULT CALLED TO, READ BACK BY AND VERIFIED WITH: Karsten Ro [facility] 09/21/17 0056 JDW Performed at [facility], [place] [address], [place], [place] [address]    Culture STAPHYLOCOCCUS AUREUS (A)  Final   Report Status 09/22/2017 FINAL  Final   Organism ID, Bacteria STAPHYLOCOCCUS AUREUS  Final      Susceptibility   Staphylococcus aureus - MIC*    CIPROFLOXACIN <=0.5 SENSITIVE Sensitive     ERYTHROMYCIN <=0.25 SENSITIVE Sensitive     GENTAMICIN <=0.5 SENSITIVE Sensitive     OXACILLIN 0.5 SENSITIVE Sensitive     TETRACYCLINE <=1 SENSITIVE Sensitive     VANCOMYCIN <=0.5 SENSITIVE Sensitive     TRIMETH/SULFA <=10 SENSITIVE Sensitive     CLINDAMYCIN <=0.25 SENSITIVE Sensitive     RIFAMPIN <=0.5 SENSITIVE Sensitive     Inducible Clindamycin NEGATIVE Sensitive     * STAPHYLOCOCCUS AUREUS  Blood Culture ID Panel (Reflexed)  Status: Abnormal   Collection Time: 09/20/17  9:04 AM  Result Value Ref Range Status   Enterococcus species NOT DETECTED NOT DETECTED Final   Listeria monocytogenes NOT DETECTED NOT DETECTED Final   Staphylococcus species DETECTED (A) NOT DETECTED Final    Comment: CRITICAL RESULT CALLED TO, READ BACK BY AND VERIFIED WITH: J LEDFORD [facility] 09/21/17 0056 JDW    Staphylococcus aureus DETECTED (A) NOT DETECTED Final    Comment: Methicillin (oxacillin) susceptible Staphylococcus aureus (MSSA). Preferred therapy is anti staphylococcal beta lactam antibiotic (Cefazolin or Nafcillin), unless clinically contraindicated. CRITICAL RESULT CALLED TO, READ BACK BY AND VERIFIED WITH: J LEDFORD [facility] 09/21/17 0056 JDW    Methicillin resistance NOT DETECTED NOT DETECTED Final   Streptococcus species NOT DETECTED NOT DETECTED Final   Streptococcus agalactiae NOT  DETECTED NOT DETECTED Final   Streptococcus pneumoniae NOT DETECTED NOT DETECTED Final   Streptococcus pyogenes NOT DETECTED NOT DETECTED Final   Acinetobacter baumannii NOT DETECTED NOT DETECTED Final   Enterobacteriaceae species NOT DETECTED NOT DETECTED Final   Enterobacter cloacae complex NOT DETECTED NOT DETECTED Final   Escherichia coli NOT DETECTED NOT DETECTED Final   Klebsiella oxytoca NOT DETECTED NOT DETECTED Final   Klebsiella pneumoniae NOT DETECTED NOT DETECTED Final   Proteus species NOT DETECTED NOT DETECTED Final   Serratia marcescens NOT DETECTED NOT DETECTED Final   Haemophilus influenzae NOT DETECTED NOT DETECTED Final   Neisseria meningitidis NOT DETECTED NOT DETECTED Final   Pseudomonas aeruginosa NOT DETECTED NOT DETECTED Final   Candida albicans NOT DETECTED NOT DETECTED Final   Candida glabrata NOT DETECTED NOT DETECTED Final   Candida krusei NOT DETECTED NOT DETECTED Final   Candida parapsilosis NOT DETECTED NOT DETECTED Final   Candida tropicalis NOT DETECTED NOT DETECTED Final    Comment: Performed at [facility], [place]. [address] [place], [place] [address]  Culture, blood (single)     Status: None   Collection Time: 09/21/17  8:10 AM  Result Value Ref Range Status   Specimen Description BLOOD HAND  Final   Special Requests   Final    BOTTLES DRAWN AEROBIC ONLY Blood Culture adequate volume   Culture   Final    NO GROWTH 5 DAYS Performed at [facility], [place] [address], [place], [place] [address]    Report Status 09/26/2017 FINAL  Final  MRSA PCR Screening     Status: None   Collection Time: 09/23/17  4:28 AM  Result Value Ref Range Status   MRSA by PCR NEGATIVE NEGATIVE Final    Comment:        The GeneXpert MRSA Assay (FDA approved for NASAL specimens only), is one component of a comprehensive MRSA colonization surveillance program. It is not intended to diagnose MRSA infection nor to guide or monitor treatment for MRSA  infections. Performed at [facility], [place] [address] [place], [place] [address]          Radiology Studies: No results found.      Scheduled Meds: . buprenorphine-naloxone  1 tablet Sublingual BID  . cloNIDine  0.1 mg Oral BH-qamhs   Followed by  . [START ON 09/29/2017] cloNIDine  0.1 mg Oral QAC breakfast  . enoxaparin (LOVENOX) injection  40 mg Subcutaneous Q24H  . gabapentin  200 mg Oral TID  . sodium chloride flush  3 mL Intravenous Q12H   Continuous Infusions: . sodium chloride 10 mL/hr at 09/24/17 2116  .  ceFAZolin (ANCEF) IV Stopped (  09/26/17 2227)     LOS: 5 days     Cordelia Poche, MD [facility] 09/27/2017, 10:09 AM Pager: [phone]  If 7PM-7AM, please contact night-coverage [email or web address] Password [facility] 09/27/2017, 10:09 AM

## 2017-09-27 NOTE — Progress Notes (Signed)
Subjective: Patient was happy to eat food and felt like she had less pain having just had her transesophageal echocardiogram.   Antibiotics:  Anti-infectives (From admission, onward)   Start     Dose/Rate Route Frequency Ordered Stop   09/22/17 0530  ceFAZolin (ANCEF) IVPB 2g/100 mL premix     2 g 200 mL/hr over 30 Minutes Intravenous Every 8 hours 09/22/17 0505        Medications: Scheduled Meds: . buprenorphine-naloxone  1 tablet Sublingual BID  . cloNIDine  0.1 mg Oral BH-qamhs   Followed by  . [START ON 09/29/2017] cloNIDine  0.1 mg Oral QAC breakfast  . enoxaparin (LOVENOX) injection  40 mg Subcutaneous Q24H  . gabapentin  200 mg Oral TID  . sodium chloride flush  3 mL Intravenous Q12H   Continuous Infusions: . sodium chloride 10 mL/hr at 09/24/17 2116  .  ceFAZolin (ANCEF) IV Stopped (09/26/17 2227)   PRN Meds:.acetaminophen **OR** acetaminophen, hydrOXYzine, ondansetron **OR** ondansetron (ZOFRAN) IV    Objective: Weight change:   Intake/Output Summary (Last 24 hours) at 09/27/2017 1307 Last data filed at 09/27/2017 1220 Gross per 24 hour  Intake 1400 ml  Output 1550 ml  Net -150 ml   Blood pressure 98/67, pulse 77, temperature 97.7 F (36.5 C), temperature source Oral, resp. rate 18, height 5\' 1"  (1.549 m), weight 110 lb (49.9 kg), last menstrual period 03/08/2017, SpO2 100 %. Temp:  [97.7 F (36.5 C)-98.8 F (37.1 C)] 97.7 F (36.5 C) (04/22 0857) Pulse Rate:  [69-100] 77 (04/22 0857) Resp:  [10-20] 18 (04/22 0857) BP: (98-116)/(60-97) 98/67 (04/22 0857) SpO2:  [100 %] 100 % (04/22 0857) Weight:  [110 lb (49.9 kg)] 110 lb (49.9 kg) (04/22 0719)  Physical Exam: General: Alert and awake, oriented x3, not in any acute distress. HEENT: anicteric sclera, pupils reactive to light and accommodation, EOMI CVS regular rate, normal r,  no murmur rubs or gallops Chest: clear to auscultation bilaterally, no wheezing, rales or rhonchi Abdomen: soft  nontender, nondistended, normal bowel sounds,  Skin: no rashes  Neuro: nonfocal  CBC:  CBC    Component Value Date/Time   WBC 9.4 09/27/2017 0623   RBC 4.68 09/27/2017 0623   HGB 11.0 (L) 09/27/2017 0623   HCT 36.6 09/27/2017 0623   PLT 677 (H) 09/27/2017 0623   MCV 78.2 09/27/2017 0623   MCH 23.5 (L) 09/27/2017 0623   MCHC 30.1 09/27/2017 0623   RDW 15.7 (H) 09/27/2017 0623   LYMPHSABS 2.7 09/22/2017 0536   MONOABS 0.8 09/22/2017 0536   EOSABS 0.1 09/22/2017 0536   BASOSABS 0.0 09/22/2017 0536      BMET Recent Labs    09/27/17 0623  NA 136  K 4.3  CL 105  CO2 23  GLUCOSE 85  BUN 12  CREATININE 0.51  CALCIUM 9.1     Liver Panel  No results for input(s): PROT, ALBUMIN, AST, ALT, ALKPHOS, BILITOT, BILIDIR, IBILI in the last 72 hours.     Sedimentation Rate No results for input(s): ESRSEDRATE in the last 72 hours. C-Reactive Protein No results for input(s): CRP in the last 72 hours.  Micro Results: Recent Results (from the past 720 hour(s))  Culture, blood (routine x 2)     Status: Abnormal   Collection Time: 09/20/17  8:49 AM  Result Value Ref Range Status   Specimen Description BLOOD LEFT HAND  Final   Special Requests   Final    BOTTLES DRAWN  AEROBIC AND ANAEROBIC Blood Culture adequate volume   Culture  Setup Time   Final    GRAM POSITIVE COCCI IN BOTH AEROBIC AND ANAEROBIC BOTTLES CRITICAL VALUE NOTED.  VALUE IS CONSISTENT WITH PREVIOUSLY REPORTED AND CALLED VALUE.    Culture (A)  Final    STAPHYLOCOCCUS AUREUS SUSCEPTIBILITIES PERFORMED ON PREVIOUS CULTURE WITHIN THE LAST 5 DAYS. Performed at [facility], [place] [address], [place], [place] [address]    Report Status 09/23/2017 FINAL  Final  Culture, blood (routine x 2)     Status: Abnormal   Collection Time: 09/20/17  9:04 AM  Result Value Ref Range Status   Specimen Description BLOOD LEFT ANTECUBITAL  Final   Special Requests   Final    BOTTLES DRAWN AEROBIC AND ANAEROBIC Blood  Culture adequate volume   Culture  Setup Time   Final    GRAM POSITIVE COCCI IN BOTH AEROBIC AND ANAEROBIC BOTTLES Organism ID to follow CRITICAL RESULT CALLED TO, READ BACK BY AND VERIFIED WITH: Karsten Ro [facility] 09/21/17 0056 JDW Performed at [facility], [place] [address] [place],  [address]    Culture STAPHYLOCOCCUS AUREUS (A)  Final   Report Status 09/22/2017 FINAL  Final   Organism ID, Bacteria STAPHYLOCOCCUS AUREUS  Final      Susceptibility   Staphylococcus aureus - MIC*    CIPROFLOXACIN <=0.5 SENSITIVE Sensitive     ERYTHROMYCIN <=0.25 SENSITIVE Sensitive     GENTAMICIN <=0.5 SENSITIVE Sensitive     OXACILLIN 0.5 SENSITIVE Sensitive     TETRACYCLINE <=1 SENSITIVE Sensitive     VANCOMYCIN <=0.5 SENSITIVE Sensitive     TRIMETH/SULFA <=10 SENSITIVE Sensitive     CLINDAMYCIN <=0.25 SENSITIVE Sensitive     RIFAMPIN <=0.5 SENSITIVE Sensitive     Inducible Clindamycin NEGATIVE Sensitive     * STAPHYLOCOCCUS AUREUS  Blood Culture ID Panel (Reflexed)     Status: Abnormal   Collection Time: 09/20/17  9:04 AM  Result Value Ref Range Status   Enterococcus species NOT DETECTED NOT DETECTED Final   Listeria monocytogenes NOT DETECTED NOT DETECTED Final   Staphylococcus species DETECTED (A) NOT DETECTED Final    Comment: CRITICAL RESULT CALLED TO, READ BACK BY AND VERIFIED WITH: J LEDFORD [facility] 09/21/17 0056 JDW    Staphylococcus aureus DETECTED (A) NOT DETECTED Final    Comment: Methicillin (oxacillin) susceptible Staphylococcus aureus (MSSA). Preferred therapy is anti staphylococcal beta lactam antibiotic (Cefazolin or Nafcillin), unless clinically contraindicated. CRITICAL RESULT CALLED TO, READ BACK BY AND VERIFIED WITH: J LEDFORD [facility] 09/21/17 0056 JDW    Methicillin resistance NOT DETECTED NOT DETECTED Final   Streptococcus species NOT DETECTED NOT DETECTED Final   Streptococcus agalactiae NOT DETECTED NOT DETECTED Final   Streptococcus pneumoniae NOT DETECTED NOT  DETECTED Final   Streptococcus pyogenes NOT DETECTED NOT DETECTED Final   Acinetobacter baumannii NOT DETECTED NOT DETECTED Final   Enterobacteriaceae species NOT DETECTED NOT DETECTED Final   Enterobacter cloacae complex NOT DETECTED NOT DETECTED Final   Escherichia coli NOT DETECTED NOT DETECTED Final   Klebsiella oxytoca NOT DETECTED NOT DETECTED Final   Klebsiella pneumoniae NOT DETECTED NOT DETECTED Final   Proteus species NOT DETECTED NOT DETECTED Final   Serratia marcescens NOT DETECTED NOT DETECTED Final   Haemophilus influenzae NOT DETECTED NOT DETECTED Final   Neisseria meningitidis NOT DETECTED NOT DETECTED Final   Pseudomonas aeruginosa NOT DETECTED NOT DETECTED Final   Candida albicans NOT DETECTED NOT DETECTED Final   Candida  glabrata NOT DETECTED NOT DETECTED Final   Candida krusei NOT DETECTED NOT DETECTED Final   Candida parapsilosis NOT DETECTED NOT DETECTED Final   Candida tropicalis NOT DETECTED NOT DETECTED Final    Comment: Performed at [facility], [place] [address] [place], [place] [address]  Culture, blood (single)     Status: None   Collection Time: 09/21/17  8:10 AM  Result Value Ref Range Status   Specimen Description BLOOD HAND  Final   Special Requests   Final    BOTTLES DRAWN AEROBIC ONLY Blood Culture adequate volume   Culture   Final    NO GROWTH 5 DAYS Performed at [facility], [place] [address] [place], [place] [address]    Report Status 09/26/2017 FINAL  Final  MRSA PCR Screening     Status: None   Collection Time: 09/23/17  4:28 AM  Result Value Ref Range Status   MRSA by PCR NEGATIVE NEGATIVE Final    Comment:        The GeneXpert MRSA Assay (FDA approved for NASAL specimens only), is one component of a comprehensive MRSA colonization surveillance program. It is not intended to diagnose MRSA infection nor to guide or monitor treatment for MRSA infections. Performed at [facility], [place] [address] [place], [place]  [address]     Studies/Results: No results found.    Assessment/Plan:  INTERVAL HISTORY: TEE shows resolution of tricuspid valve vegetation   Principal Problem:   Anxiety and depression Active Problems:   Bacteremia due to Staphylococcus aureus   Opioid type dependence, continuous (HCC)   Acute osteomyelitis of cervical spine (HCC)   Osteomyelitis (HCC)    Sherri Francis is a 32 y.o. female with methicillin sensitive staph aureus bacteremia and change in her C1 through C5 spine concerning for vertebral osteomyelitis also with hepatitis C and IV drug use  1.  Staphylococcus aureus bacteremia with potential discitis, vertebral osteomyelitis        Antimicrobial Management Team Staphylococcus aureus bacteremia   Staphylococcus aureus bacteremia (SAB) is associated with a high rate of complications and mortality.  Specific aspects of clinical management are critical to optimizing the outcome of patients with SAB.  Therefore, the [facility] Antimicrobial Management Team [facility]) has initiated an intervention aimed at improving the management of SAB at [facility].  To do so, Infectious Diseases physicians are providing an evidence-based consult for the management of all patients with SAB.     Yes No Comments  Perform follow-up blood cultures (even if the patient is afebrile) to ensure clearance of bacteremia [x]  []   blood cultures have been negative from the 15th  Remove vascular catheter and obtain follow-up blood cultures after the removal of the catheter [x]  []    Perform echocardiography to evaluate for endocarditis (transthoracic ECHO is 40-50% sensitive, TEE is > 90% sensitive) []  []  Please keep in mind, that neither test can definitively EXCLUDE endocarditis, and that should clinical suspicion remain high for endocarditis the patient should then still be treated with an "endocarditis" duration of therapy = 6 weeks  TEE does not show any evidence of vegetation anymore    Consult electrophysiologist to evaluate implanted cardiac device (pacemaker, ICD) []  []    Ensure source control []  []  Have all abscesses been drained effectively? Have deep seeded infections (septic joints or osteomyelitis) had appropriate surgical debridement?  Investigate for "metastatic" sites of infection []  []  Does the patient have ANY symptom or physical exam finding that  would suggest a deeper infection (back or neck pain that may be suggestive of vertebral osteomyelitis or epidural abscess, muscle pain that could be a symptom of pyomyositis)?  Keep in mind that for deep seeded infections MRI imaging with contrast is preferred rather than other often insensitive tests such as plain x-rays, especially early in a patient's presentation.  Her C-spine has edema and C1 through C5  Change antibiotic therapy to cefazolin []  []  Beta-lactam antibiotics are preferred for MSSA due to higher cure rates.   If on Vancomycin, goal trough should be 15 - 20 mcg/mL  Estimated duration of IV antibiotic therapy: Traditional therapy would be 8 weeks given concern for osteomyelitis here.  If she is deemed competent to make decisions again and is not involuntary committed anymore and tries to leave the hospital [facility] we may have to resort to something else such as a long-acting agent.  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  She claims in the past Suboxone was not helpful and was interested in with methadone but I told her we do not have providers here that I know of in the hospital that can prescribe methadone so she is agreeable to continue  with Suboxone.  #3 hepatitis C: Would like to treat her and in fact if we could get her pharmaceutical assistance outside the hospital I would treat her even if she was still actively using because I believe in "treatment as prevention "   LOS: 5 days   Alcide Evener 09/27/2017, 1:07 PM

## 2017-09-27 NOTE — CV Procedure (Signed)
   TRANSESOPHAGEAL ECHOCARDIOGRAM (TEE) NOTE  INDICATIONS: infective endocarditis  PROCEDURE:   Informed consent was obtained prior to the procedure. The risks, benefits and alternatives for the procedure were discussed and the patient comprehended these risks.  Risks include, but are not limited to, cough, sore throat, vomiting, nausea, somnolence, esophageal and stomach trauma or perforation, bleeding, low blood pressure, aspiration, pneumonia, infection, trauma to the teeth and death.    After a procedural time-out, the patient was given propofol per anesthesia for sedation.  The patient's heart rate, blood pressure, and oxygen saturation are monitored continuously during the procedure. The transesophageal probe was inserted in the esophagus and stomach without difficulty and multiple views were obtained.  The patient was kept under observation until the patient left the procedure room. The patient left the procedure room in stable condition.   Agitated microbubble saline contrast was administered.  COMPLICATIONS:    There were no immediate complications.  Findings:  1. LEFT VENTRICLE: The left ventricular wall thickness is normal.  The left ventricular cavity is normal in size. Wall motion is normal.  LVEF is 55-60%.  2. RIGHT VENTRICLE:  The right ventricle is normal in structure and function without any thrombus or masses.    3. LEFT ATRIUM:  The left atrium is normal in size without any thrombus or masses.  There is not spontaneous echo contrast ("smoke") in the left atrium consistent with a low flow state.  4. LEFT ATRIAL APPENDAGE:  The left atrial appendage is free of any thrombus or masses. The appendage has single lobes. Pulse doppler indicates moderate flow in the appendage.  5. ATRIAL SEPTUM:  The atrial septum is hypermobile. There is evidence for a very small amount of interatrial shunting by saline microbubble consistent with PFO.  6. RIGHT ATRIUM:  The right atrium is  normal in size and function without any thrombus or masses.  7. MITRAL VALVE:  The mitral valve is normal in structure and function with trivial regurgitation.  There were no vegetations or stenosis.  8. AORTIC VALVE:  The aortic valve is trileaflet, normal in structure and function with no regurgitation.  There were no vegetations or stenosis  9. TRICUSPID VALVE:  The tricuspid valve is thickened with mild prolapse of the septal leaflet and Mild regurgitation.  The previously noted vegetation has resolved.  10.  PULMONIC VALVE:  The pulmonic valve is normal in structure and function with no regurgitation.  There were no vegetations or stenosis.   11. AORTIC ARCH, ASCENDING AND DESCENDING AORTA:  There was no Ron Parker et. Al, 1992) atherosclerosis of the ascending aorta, aortic arch, or proximal descending aorta.  12. PULMONARY VEINS: Anomalous pulmonary venous return was not noted.  13. PERICARDIUM: The pericardium appeared normal and non-thickened.  There is no pericardial effusion.  IMPRESSION:   1. No evidence for endocarditis. The previously noted TV vegetation has resolved. 2. No LAA thrombus 3. Very small PFO noted with saline microbubble contrast 4. LVEF 55-60%  RECOMMENDATIONS:    1.  Further management per infectious disease recommendations.  Time Spent Directly with the Patient:  45 minutes   Pixie Casino, MD, [facility], [place] Director of the [facility]  Cardiovascular [facility] Clinic Diplomate of the American Board of Clinical Lipidology Attending Cardiologist  Direct Dial: [phone]  Fax: [phone]  Website:  [email or web address] 09/27/2017, 8:46 AM

## 2017-09-27 NOTE — Anesthesia Procedure Notes (Signed)
Procedure Name: MAC Date/Time: 09/27/2017 8:16 AM Performed by: Imagene Riches, CRNA Pre-anesthesia Checklist: Patient identified, Emergency Drugs available, Suction available and Patient being monitored Patient Re-evaluated:Patient Re-evaluated prior to induction Oxygen Delivery Method: Nasal cannula Preoxygenation: Pre-oxygenation with 100% oxygen Induction Type: IV induction

## 2017-09-28 DIAGNOSIS — M4642 Discitis, unspecified, cervical region: Secondary | ICD-10-CM

## 2017-09-28 DIAGNOSIS — G2581 Restless legs syndrome: Secondary | ICD-10-CM

## 2017-09-28 DIAGNOSIS — Z8679 Personal history of other diseases of the circulatory system: Secondary | ICD-10-CM

## 2017-09-28 MED ORDER — POLYETHYLENE GLYCOL 3350 17 G PO PACK
17.0000 g | PACK | Freq: Every day | ORAL | Status: DC
  Administered 2017-09-30 – 2017-10-01 (×2): 17 g via ORAL
  Filled 2017-09-28 (×5): qty 1

## 2017-09-28 NOTE — Progress Notes (Addendum)
PROGRESS NOTE    Sherri Francis  ATF:[id number] DOB: 05-16-1986 DOA: 09/22/2017 PCP: Patient, No Pcp Per   Brief Narrative: Sherri Francis is a 32 y.o. female with a history of polysubstance/IV drug use, hepatitis C, MSSA endocarditis. She presented after leaving AMA multiple times in the last 8 months for incomplete treatment of her MSSA endocarditis. She is currently IVCd with recommendations for inpatient behavioral health admission. Patient refusing suboxone therapy. Clonidine taper for withdrawal.   Assessment & Plan:   Principal Problem:   Anxiety and depression Active Problems:   Bacteremia due to Staphylococcus aureus   Opioid type dependence, continuous (HCC)   Acute osteomyelitis of cervical spine (HCC)   Osteomyelitis of cervical spine (HCC)   ?Acute osteomyelitis of cervical spine Bacteremia/Endocarditis secondary to MSSA Patient recently left AMA. Currently IVCd. Repeat blood culture from 4/16 (single), no growth. MRI suggests possible infection but no definite osteomyelitis. Transesophageal Echocardiogram performed and significant for resolution of prior TV vegetation. No new vegetation. PFO noted. -Continue cefazolin -Infectious disease recommendations: Cefazolin x8 weeks  Suicidal ideation Psychiatry initially recommended inpatient psych. On reevaluation, cleared from inpatient psych recommendations. Started on Effexor.  Opiate dependence Polysubstance abuse Patient desires abstinence from IV drug use. No withdrawal symptoms -Clonidine taper -symptom control with atarax, robaxin, naproxen -suboxone -Continue hydroxyzine  PFO Seen on Transesophageal Echocardiogram. Small.   DVT prophylaxis: Lovenox Code Status:   Code Status: Full Code Family Communication: Mother at bedside Disposition Plan: Discharge when bacteremia treated   Consultants:   Infectious disease  Cardiology  Procedures:   Transesophageal Echocardiogram IMPRESSION:   1. No  evidence for endocarditis. The previously noted TV vegetation has resolved. 2. No LAA thrombus 3. Very small PFO noted with saline microbubble contrast 4. LVEF 55-60%     Antimicrobials:  Cefazolin    Subjective: Poor appetite, otherwise, feels good.  Objective: Vitals:   09/27/17 0857 09/27/17 1326 09/27/17 2141 09/28/17 0447  BP: 98/67 123/84 110/63 (!) 95/56  Pulse: 77 89 78 85  Resp: 18 16  18   Temp: 97.7 F (36.5 C) 98.4 F (36.9 C) 98 F (36.7 C) 98.2 F (36.8 C)  TempSrc: Oral Oral Oral Oral  SpO2: 100% 100% 100% 100%  Weight:      Height:        Intake/Output Summary (Last 24 hours) at 09/28/2017 1045 Last data filed at 09/28/2017 0846 Gross per 24 hour  Intake 1670 ml  Output 350 ml  Net 1320 ml   Filed Weights   09/23/17 0430 09/24/17 2012 09/27/17 0719  Weight: 46.9 kg (103 lb 6.3 oz) 49.9 kg (110 lb) 49.9 kg (110 lb)    Examination:  General: Well appearing, no distress Psych: full affect, not anxious appearing. No suicidal ideation.    Data Reviewed: I have personally reviewed following labs and imaging studies  CBC: Recent Labs  Lab 09/22/17 0536 09/23/17 0547 09/27/17 0623  WBC 12.0* 7.0 9.4  NEUTROABS 8.3*  --   --   HGB 10.5* 9.7* 11.0*  HCT 33.9* 32.0* 36.6  MCV 77.0* 77.1* 78.2  PLT 520* 555* 270*   Basic Metabolic Panel: Recent Labs  Lab 09/22/17 0536 09/23/17 0547 09/27/17 0623  NA 136 138 136  K 3.2* 3.9 4.3  CL 106 107 105  CO2 21* 22 23  GLUCOSE 120* 117* 85  BUN 5* 8 12  CREATININE 0.42* 0.41* 0.51  CALCIUM 8.6* 8.6* 9.1   GFR: Estimated Creatinine Clearance: 77.6 mL/min (by C-G formula based on  SCr of 0.51 mg/dL). Liver Function Tests: No results for input(s): AST, ALT, ALKPHOS, BILITOT, PROT, ALBUMIN in the last 168 hours. No results for input(s): LIPASE, AMYLASE in the last 168 hours. No results for input(s): AMMONIA in the last 168 hours. Coagulation Profile: No results for input(s): INR, PROTIME in  the last 168 hours. Cardiac Enzymes: No results for input(s): CKTOTAL, CKMB, CKMBINDEX, TROPONINI in the last 168 hours. BNP (last 3 results) No results for input(s): PROBNP in the last 8760 hours. HbA1C: No results for input(s): HGBA1C in the last 72 hours. CBG: No results for input(s): GLUCAP in the last 168 hours. Lipid Profile: No results for input(s): CHOL, HDL, LDLCALC, TRIG, CHOLHDL, LDLDIRECT in the last 72 hours. Thyroid Function Tests: No results for input(s): TSH, T4TOTAL, FREET4, T3FREE, THYROIDAB in the last 72 hours. Anemia Panel: No results for input(s): VITAMINB12, FOLATE, FERRITIN, TIBC, IRON, RETICCTPCT in the last 72 hours. Sepsis Labs: No results for input(s): PROCALCITON, LATICACIDVEN in the last 168 hours.  Recent Results (from the past 240 hour(s))  Culture, blood (routine x 2)     Status: Abnormal   Collection Time: 09/20/17  8:49 AM  Result Value Ref Range Status   Specimen Description BLOOD LEFT HAND  Final   Special Requests   Final    BOTTLES DRAWN AEROBIC AND ANAEROBIC Blood Culture adequate volume   Culture  Setup Time   Final    GRAM POSITIVE COCCI IN BOTH AEROBIC AND ANAEROBIC BOTTLES CRITICAL VALUE NOTED.  VALUE IS CONSISTENT WITH PREVIOUSLY REPORTED AND CALLED VALUE.    Culture (A)  Final    STAPHYLOCOCCUS AUREUS SUSCEPTIBILITIES PERFORMED ON PREVIOUS CULTURE WITHIN THE LAST 5 DAYS. Performed at [facility], [place] [address], [place], [place] [address]    Report Status 09/23/2017 FINAL  Final  Culture, blood (routine x 2)     Status: Abnormal   Collection Time: 09/20/17  9:04 AM  Result Value Ref Range Status   Specimen Description BLOOD LEFT ANTECUBITAL  Final   Special Requests   Final    BOTTLES DRAWN AEROBIC AND ANAEROBIC Blood Culture adequate volume   Culture  Setup Time   Final    GRAM POSITIVE COCCI IN BOTH AEROBIC AND ANAEROBIC BOTTLES Organism ID to follow CRITICAL RESULT CALLED TO, READ BACK BY AND VERIFIED WITH: Karsten Ro [facility] 09/21/17 0056 JDW Performed at [facility], [place] [address] [place], [place] [address]    Culture STAPHYLOCOCCUS AUREUS (A)  Final   Report Status 09/22/2017 FINAL  Final   Organism ID, Bacteria STAPHYLOCOCCUS AUREUS  Final      Susceptibility   Staphylococcus aureus - MIC*    CIPROFLOXACIN <=0.5 SENSITIVE Sensitive     ERYTHROMYCIN <=0.25 SENSITIVE Sensitive     GENTAMICIN <=0.5 SENSITIVE Sensitive     OXACILLIN 0.5 SENSITIVE Sensitive     TETRACYCLINE <=1 SENSITIVE Sensitive     VANCOMYCIN <=0.5 SENSITIVE Sensitive     TRIMETH/SULFA <=10 SENSITIVE Sensitive     CLINDAMYCIN <=0.25 SENSITIVE Sensitive     RIFAMPIN <=0.5 SENSITIVE Sensitive     Inducible Clindamycin NEGATIVE Sensitive     * STAPHYLOCOCCUS AUREUS  Blood Culture ID Panel (Reflexed)     Status: Abnormal   Collection Time: 09/20/17  9:04 AM  Result Value Ref Range Status   Enterococcus species NOT DETECTED NOT DETECTED Final   Listeria monocytogenes NOT DETECTED NOT DETECTED Final   Staphylococcus species DETECTED (A) NOT DETECTED Final  Comment: CRITICAL RESULT CALLED TO, READ BACK BY AND VERIFIED WITH: J LEDFORD [facility] 09/21/17 0056 JDW    Staphylococcus aureus DETECTED (A) NOT DETECTED Final    Comment: Methicillin (oxacillin) susceptible Staphylococcus aureus (MSSA). Preferred therapy is anti staphylococcal beta lactam antibiotic (Cefazolin or Nafcillin), unless clinically contraindicated. CRITICAL RESULT CALLED TO, READ BACK BY AND VERIFIED WITH: J LEDFORD [facility] 09/21/17 0056 JDW    Methicillin resistance NOT DETECTED NOT DETECTED Final   Streptococcus species NOT DETECTED NOT DETECTED Final   Streptococcus agalactiae NOT DETECTED NOT DETECTED Final   Streptococcus pneumoniae NOT DETECTED NOT DETECTED Final   Streptococcus pyogenes NOT DETECTED NOT DETECTED Final   Acinetobacter baumannii NOT DETECTED NOT DETECTED Final   Enterobacteriaceae species NOT DETECTED NOT DETECTED Final    Enterobacter cloacae complex NOT DETECTED NOT DETECTED Final   Escherichia coli NOT DETECTED NOT DETECTED Final   Klebsiella oxytoca NOT DETECTED NOT DETECTED Final   Klebsiella pneumoniae NOT DETECTED NOT DETECTED Final   Proteus species NOT DETECTED NOT DETECTED Final   Serratia marcescens NOT DETECTED NOT DETECTED Final   Haemophilus influenzae NOT DETECTED NOT DETECTED Final   Neisseria meningitidis NOT DETECTED NOT DETECTED Final   Pseudomonas aeruginosa NOT DETECTED NOT DETECTED Final   Candida albicans NOT DETECTED NOT DETECTED Final   Candida glabrata NOT DETECTED NOT DETECTED Final   Candida krusei NOT DETECTED NOT DETECTED Final   Candida parapsilosis NOT DETECTED NOT DETECTED Final   Candida tropicalis NOT DETECTED NOT DETECTED Final    Comment: Performed at [facility], [place]. [address], [place], [place] [address]  Culture, blood (single)     Status: None   Collection Time: 09/21/17  8:10 AM  Result Value Ref Range Status   Specimen Description BLOOD HAND  Final   Special Requests   Final    BOTTLES DRAWN AEROBIC ONLY Blood Culture adequate volume   Culture   Final    NO GROWTH 5 DAYS Performed at [facility], [place] [address] [place], [place] [address]    Report Status 09/26/2017 FINAL  Final  MRSA PCR Screening     Status: None   Collection Time: 09/23/17  4:28 AM  Result Value Ref Range Status   MRSA by PCR NEGATIVE NEGATIVE Final    Comment:        The GeneXpert MRSA Assay (FDA approved for NASAL specimens only), is one component of a comprehensive MRSA colonization surveillance program. It is not intended to diagnose MRSA infection nor to guide or monitor treatment for MRSA infections. Performed at [facility], [place] [address] [place], [place] [address]          Radiology Studies: No results found.      Scheduled Meds: . buprenorphine-naloxone  1 tablet Sublingual BID  . cloNIDine  0.1 mg Oral BH-qamhs   Followed by  .  [START ON 09/29/2017] cloNIDine  0.1 mg Oral QAC breakfast  . enoxaparin (LOVENOX) injection  40 mg Subcutaneous Q24H  . gabapentin  200 mg Oral TID  . polyethylene glycol  17 g Oral Daily  . sodium chloride flush  3 mL Intravenous Q12H  . venlafaxine XR  75 mg Oral Q breakfast   Continuous Infusions: . sodium chloride 10 mL/hr at 09/24/17 2116  .  ceFAZolin (ANCEF) IV Stopped (09/28/17 8088)     LOS: 6 days     Cordelia Poche, MD [facility] 09/28/2017, 10:45 AM Pager: [phone]  If 7PM-7AM,  please contact night-coverage [email or web address] Password [facility] 09/28/2017, 10:45 AM

## 2017-09-28 NOTE — Progress Notes (Signed)
[facility] for Infectious Disease  Date of Admission:  09/22/2017     Total days of antibiotics Ancef          Patient ID: Sherri Francis is a 32 y.o. female with  Principal Problem:   Acute osteomyelitis of cervical spine ([place]) Active Problems:   Bacteremia due to Staphylococcus aureus   Opioid type dependence, continuous ([place])   Osteomyelitis of cervical spine (HCC)   Anxiety and depression   . buprenorphine-naloxone  1 tablet Sublingual BID  . cloNIDine  0.1 mg Oral BH-qamhs   Followed by  . [START ON 09/29/2017] cloNIDine  0.1 mg Oral QAC breakfast  . enoxaparin (LOVENOX) injection  40 mg Subcutaneous Q24H  . gabapentin  200 mg Oral TID  . polyethylene glycol  17 g Oral Daily  . sodium chloride flush  3 mL Intravenous Q12H  . venlafaxine XR  75 mg Oral Q breakfast    SUBJECTIVE: Feels "like crap" today. Has only gotten about 1-2 hours of sleep at a time the last few days. Does report improvement in neck pain compared to when she was admitted. Feels the suboxone is "ok" regarding controlling her cravings.    No Known Allergies  OBJECTIVE: Vitals:   09/27/17 1326 09/27/17 2141 09/28/17 0447 09/28/17 1245  BP: 123/84 110/63 (!) 95/56 124/65  Pulse: 89 78 85 79  Resp: 16  18 16   Temp: 98.4 F (36.9 C) 98 F (36.7 C) 98.2 F (36.8 C) 98.1 F (36.7 C)  TempSrc: Oral Oral Oral Oral  SpO2: 100% 100% 100% 100%  Weight:      Height:       Body mass index is 20.78 kg/m.  Physical Exam  Constitutional: She is oriented to person, place, and time.  Seated comfortably on couch.   HENT:  Mouth/Throat: No oral lesions. No dental abscesses. No oropharyngeal exudate.  Eyes: Pupils are equal, round, and reactive to light. No scleral icterus.  Neck: Normal range of motion.  Cardiovascular: Normal rate, regular rhythm, normal heart sounds and intact distal pulses.  No murmur heard. Pulmonary/Chest: Effort normal and breath sounds normal.  Abdominal: Soft.  She exhibits no distension. There is no tenderness.  Musculoskeletal: Normal range of motion. She exhibits no tenderness.  Lymphadenopathy:    She has no cervical adenopathy.  Neurological: She is alert and oriented to person, place, and time.  Skin: Skin is warm and dry. No rash noted.  Psychiatric: She has a normal mood and affect. Judgment and thought content normal.  Vitals reviewed.   Lab Results Lab Results  Component Value Date   WBC 9.4 09/27/2017   HGB 11.0 (L) 09/27/2017   HCT 36.6 09/27/2017   MCV 78.2 09/27/2017   PLT 677 (H) 09/27/2017    Lab Results  Component Value Date   CREATININE 0.51 09/27/2017   BUN 12 09/27/2017   NA 136 09/27/2017   K 4.3 09/27/2017   CL 105 09/27/2017   CO2 23 09/27/2017    Lab Results  Component Value Date   ALT 24 09/20/2017   AST 19 09/20/2017   ALKPHOS 97 09/20/2017   BILITOT 0.1 (L) 09/20/2017     Microbiology: Recent Results (from the past 240 hour(s))  Culture, blood (routine x 2)     Status: Abnormal   Collection Time: 09/20/17  8:49 AM  Result Value Ref Range Status   Specimen Description BLOOD LEFT HAND  Final   Special Requests  Final    BOTTLES DRAWN AEROBIC AND ANAEROBIC Blood Culture adequate volume   Culture  Setup Time   Final    GRAM POSITIVE COCCI IN BOTH AEROBIC AND ANAEROBIC BOTTLES CRITICAL VALUE NOTED.  VALUE IS CONSISTENT WITH PREVIOUSLY REPORTED AND CALLED VALUE.    Culture (A)  Final    STAPHYLOCOCCUS AUREUS SUSCEPTIBILITIES PERFORMED ON PREVIOUS CULTURE WITHIN THE LAST 5 DAYS. Performed at [facility], [place] [address], [place], [place] [address]    Report Status 09/23/2017 FINAL  Final  Culture, blood (routine x 2)     Status: Abnormal   Collection Time: 09/20/17  9:04 AM  Result Value Ref Range Status   Specimen Description BLOOD LEFT ANTECUBITAL  Final   Special Requests   Final    BOTTLES DRAWN AEROBIC AND ANAEROBIC Blood Culture adequate volume   Culture  Setup Time   Final     GRAM POSITIVE COCCI IN BOTH AEROBIC AND ANAEROBIC BOTTLES Organism ID to follow CRITICAL RESULT CALLED TO, READ BACK BY AND VERIFIED WITH: Karsten Ro [facility] 09/21/17 0056 JDW Performed at [facility], [place] [address] [place], [place] [address]    Culture STAPHYLOCOCCUS AUREUS (A)  Final   Report Status 09/22/2017 FINAL  Final   Organism ID, Bacteria STAPHYLOCOCCUS AUREUS  Final      Susceptibility   Staphylococcus aureus - MIC*    CIPROFLOXACIN <=0.5 SENSITIVE Sensitive     ERYTHROMYCIN <=0.25 SENSITIVE Sensitive     GENTAMICIN <=0.5 SENSITIVE Sensitive     OXACILLIN 0.5 SENSITIVE Sensitive     TETRACYCLINE <=1 SENSITIVE Sensitive     VANCOMYCIN <=0.5 SENSITIVE Sensitive     TRIMETH/SULFA <=10 SENSITIVE Sensitive     CLINDAMYCIN <=0.25 SENSITIVE Sensitive     RIFAMPIN <=0.5 SENSITIVE Sensitive     Inducible Clindamycin NEGATIVE Sensitive     * STAPHYLOCOCCUS AUREUS  Blood Culture ID Panel (Reflexed)     Status: Abnormal   Collection Time: 09/20/17  9:04 AM  Result Value Ref Range Status   Enterococcus species NOT DETECTED NOT DETECTED Final   Listeria monocytogenes NOT DETECTED NOT DETECTED Final   Staphylococcus species DETECTED (A) NOT DETECTED Final    Comment: CRITICAL RESULT CALLED TO, READ BACK BY AND VERIFIED WITH: J LEDFORD [facility] 09/21/17 0056 JDW    Staphylococcus aureus DETECTED (A) NOT DETECTED Final    Comment: Methicillin (oxacillin) susceptible Staphylococcus aureus (MSSA). Preferred therapy is anti staphylococcal beta lactam antibiotic (Cefazolin or Nafcillin), unless clinically contraindicated. CRITICAL RESULT CALLED TO, READ BACK BY AND VERIFIED WITH: J LEDFORD [facility] 09/21/17 0056 JDW    Methicillin resistance NOT DETECTED NOT DETECTED Final   Streptococcus species NOT DETECTED NOT DETECTED Final   Streptococcus agalactiae NOT DETECTED NOT DETECTED Final   Streptococcus pneumoniae NOT DETECTED NOT DETECTED Final   Streptococcus pyogenes NOT DETECTED NOT  DETECTED Final   Acinetobacter baumannii NOT DETECTED NOT DETECTED Final   Enterobacteriaceae species NOT DETECTED NOT DETECTED Final   Enterobacter cloacae complex NOT DETECTED NOT DETECTED Final   Escherichia coli NOT DETECTED NOT DETECTED Final   Klebsiella oxytoca NOT DETECTED NOT DETECTED Final   Klebsiella pneumoniae NOT DETECTED NOT DETECTED Final   Proteus species NOT DETECTED NOT DETECTED Final   Serratia marcescens NOT DETECTED NOT DETECTED Final   Haemophilus influenzae NOT DETECTED NOT DETECTED Final   Neisseria meningitidis NOT DETECTED NOT DETECTED Final   Pseudomonas aeruginosa NOT DETECTED NOT DETECTED Final   Candida albicans NOT DETECTED  NOT DETECTED Final   Candida glabrata NOT DETECTED NOT DETECTED Final   Candida krusei NOT DETECTED NOT DETECTED Final   Candida parapsilosis NOT DETECTED NOT DETECTED Final   Candida tropicalis NOT DETECTED NOT DETECTED Final    Comment: Performed at [facility], [place] [address], [place], [place] [address]  Culture, blood (single)     Status: None   Collection Time: 09/21/17  8:10 AM  Result Value Ref Range Status   Specimen Description BLOOD HAND  Final   Special Requests   Final    BOTTLES DRAWN AEROBIC ONLY Blood Culture adequate volume   Culture   Final    NO GROWTH 5 DAYS Performed at [facility], [place] [address] [place], [place] [address]    Report Status 09/26/2017 FINAL  Final  MRSA PCR Screening     Status: None   Collection Time: 09/23/17  4:28 AM  Result Value Ref Range Status   MRSA by PCR NEGATIVE NEGATIVE Final    Comment:        The GeneXpert MRSA Assay (FDA approved for NASAL specimens only), is one component of a comprehensive MRSA colonization surveillance program. It is not intended to diagnose MRSA infection nor to guide or monitor treatment for MRSA infections. Performed at [facility], [place] [address] [place] [address], [place], [place] [address]     ASSESSMENT: 32 y.o. F with MSSA bacteremia  in setting of c-spine discitis (no discernable abscess formation on MR). TEE was negative (history of TV veg in 02/2017). She has not yet had clearance blood cultures obtained - will check now. Can repeat Hep C RNA (last one was 01/2017) with next blood draw. Continue Cefazolin x 8 weeks with end date pending considering no clearance proven on cultures yet. She assures me she is "going to stay the whole time" this time as she is very concerned about her infection. Would really like some help with sleep regimen - apparently she takes 600 mg QHS gabapentin to help with RLS/sleep. Trazodone previously she reports to have not worked. I do not think this is an unreasonable request and appreciate the help from her primary team so she can get some better sleep.    Platelet count increasing and nearly 700 now - ? Related to cefazolin vs pro-thrombotic infectious etiology. Would monitor for now. Unfortunately she is refusing her lovenox.   PLAN: 1. Repeat blood cultures today  2. Appreciate primary team to help with sleep regimen for [place] (one time gabapentin dose QHS vs TID? She takes gabapentin 600 mg QHS for RLS at home from her description).  3. Continue cefazolin  4. Would check platelet counts at least weekly while on therapy.   Janene Madeira, MSN, NP-C [facility] for Infectious [place] Cell: [phone] Pager: [phone]  09/28/2017  1:29 PM

## 2017-09-29 ENCOUNTER — Inpatient Hospital Stay (HOSPITAL_COMMUNITY): Payer: Self-pay

## 2017-09-29 DIAGNOSIS — Z95828 Presence of other vascular implants and grafts: Secondary | ICD-10-CM

## 2017-09-29 MED ORDER — DICLOFENAC SODIUM 1 % TD GEL
2.0000 g | Freq: Four times a day (QID) | TRANSDERMAL | Status: DC | PRN
  Administered 2017-10-01: 2 g via TOPICAL
  Filled 2017-09-29: qty 100

## 2017-09-29 MED ORDER — LIDOCAINE 5 % EX PTCH
1.0000 | MEDICATED_PATCH | CUTANEOUS | Status: DC
  Administered 2017-09-29 – 2017-10-01 (×2): 1 via TRANSDERMAL
  Filled 2017-09-29 (×3): qty 1

## 2017-09-29 MED ORDER — GADOBENATE DIMEGLUMINE 529 MG/ML IV SOLN
10.0000 mL | Freq: Once | INTRAVENOUS | Status: AC | PRN
  Administered 2017-09-29: 10 mL via INTRAVENOUS

## 2017-09-29 MED ORDER — GABAPENTIN 400 MG PO CAPS
400.0000 mg | ORAL_CAPSULE | Freq: Every day | ORAL | Status: DC
  Administered 2017-09-29 – 2017-10-01 (×3): 400 mg via ORAL
  Filled 2017-09-29 (×3): qty 1

## 2017-09-29 NOTE — Progress Notes (Signed)
[facility] for Infectious Disease  Date of Admission:  09/22/2017     Total days of antibiotics 7   Day 7 Ancef          Patient ID: Sherri Francis is a 32 y.o. female with  Principal Problem:   Acute osteomyelitis of cervical spine ([place]) Active Problems:   Bacteremia due to Staphylococcus aureus   Opioid type dependence, continuous ([place])   Osteomyelitis of cervical spine (HCC)   Anxiety and depression   . buprenorphine-naloxone  1 tablet Sublingual BID  . cloNIDine  0.1 mg Oral QAC breakfast  . enoxaparin (LOVENOX) injection  40 mg Subcutaneous Q24H  . gabapentin  200 mg Oral TID  . polyethylene glycol  17 g Oral Daily  . sodium chloride flush  3 mL Intravenous Q12H  . venlafaxine XR  75 mg Oral Q breakfast    SUBJECTIVE: Slept 6 hours last night - tells me that she had a close family friend pass away last night from heroin overdose. She does have some stiffness in the neck but new pain in lower spine.   Afebrile.    No Known Allergies  OBJECTIVE: Vitals:   09/28/17 1245 09/28/17 2053 09/29/17 0525 09/29/17 0815  BP: 124/65 123/70 108/71 113/74  Pulse: 79 75 64   Resp: 16 16 16    Temp: 98.1 F (36.7 C) 98 F (36.7 C) 98.1 F (36.7 C)   TempSrc: Oral Oral Oral   SpO2: 100% 99% 97%   Weight:      Height:       Body mass index is 20.78 kg/m.  Physical Exam  Constitutional: She is oriented to person, place, and time.  Seated comfortably on her bed talking on the phone.   HENT:  Mouth/Throat: No oral lesions. No dental abscesses. No oropharyngeal exudate.  Eyes: Pupils are equal, round, and reactive to light. No scleral icterus.  Neck: Normal range of motion.  Cardiovascular: Normal rate, regular rhythm, normal heart sounds and intact distal pulses.  No murmur heard. Pulmonary/Chest: Effort normal and breath sounds normal.  Abdominal: Soft. She exhibits no distension. There is no tenderness.  Musculoskeletal:       Lumbar back: She exhibits  tenderness. She exhibits normal range of motion.       Back:  Lymphadenopathy:    She has no cervical adenopathy.  Neurological: She is alert and oriented to person, place, and time. No sensory deficit. She exhibits normal muscle tone.  Skin: Skin is warm and dry. No rash noted.  Psychiatric: She has a normal mood and affect. Judgment and thought content normal.  Vitals reviewed.   Lab Results Lab Results  Component Value Date   WBC 9.4 09/27/2017   HGB 11.0 (L) 09/27/2017   HCT 36.6 09/27/2017   MCV 78.2 09/27/2017   PLT 677 (H) 09/27/2017    Lab Results  Component Value Date   CREATININE 0.51 09/27/2017   BUN 12 09/27/2017   NA 136 09/27/2017   K 4.3 09/27/2017   CL 105 09/27/2017   CO2 23 09/27/2017    Lab Results  Component Value Date   ALT 24 09/20/2017   AST 19 09/20/2017   ALKPHOS 97 09/20/2017   BILITOT 0.1 (L) 09/20/2017     Microbiology: BCx 4/15 >> 2/2 MSSA BCx 4/23 >> NG x 24h   ASSESSMENT: 33 y.o. F with MSSA bacteremia in setting of c-spine discitis (no discernable abscess formation on MR). TEE  was negative (history of TV veg in 02/2017). Repeat blood cultures from 4/16 no growth final with 1 set and those drawn 4/23 without growth at 24h. Appreciate thoroughness for back pain evaluation with L-spine scan by Dr. Florene Glen. Continue Cefazolin x 8 weeks through 11/17/17. May require PICC to avoid phlebitis related to prolonged PIVs - would continue through peripheral line for now.   Platelet count increasing and nearly 700 now - ? Related to cefazolin vs pro-thrombotic state r/t infection?Marland Kitchen Would monitor with twice weekly CBCs. Weekly CMET please.   I offered contacting chaplain/spiritual care services for her today given the loss of a close friend to heroin addiction last night, however she politely declined.   Last HIV Ab in 01/2017 - will check with next lab draw given high risk behavior.  Check quantitative Hep C RNA next lab draw as well. Can offer treatment  outpatient if evidence of chronic infection. She would benefit from addiction counseling and continued opioid replacement.    PLAN: 1. MRI L-spine ordered 2. Continue cefazolin via peripheral IV for now 3. HIV Ab & Hep C RNA quant next lab draw  4. Recommend twice weekly CBC, weekly CMET.   Janene Madeira, MSN, NP-C [facility] for Infectious [place] Cell: [phone] Pager: [phone]  09/29/2017  11:31 AM

## 2017-09-29 NOTE — Progress Notes (Addendum)
PROGRESS NOTE    Sherri Francis  CHE:[id number] DOB: 02-Feb-1986 DOA: 09/22/2017 PCP: Patient, No Pcp Per   Brief Narrative:  [name] is Sherri Francis 32 y.o. female with [name] history of polysubstance/IV drug use, hepatitis C, MSSA endocarditis. She presented after leaving AMA multiple times in the last 8 months for incomplete treatment of her MSSA endocarditis. She is currently IVCd with recommendations for inpatient behavioral health admission. Patient refusing suboxone therapy. Clonidine taper for withdrawal.  Assessment & Plan:   Principal Problem:   Acute osteomyelitis of cervical spine (HCC) Active Problems:   Bacteremia due to Staphylococcus aureus   Opioid type dependence, continuous (HCC)   Osteomyelitis of cervical spine (HCC)   Anxiety and depression   ?Acute osteomyelitis of cervical spine Bacteremia/Endocarditis secondary to MSSA Patient recently left AMA. Previously IVCd, but most recently psychiatrically cleared.  Repeat blood culture from 4/16 (single), no growth. Repeat blood cx from 4/23 pending  MRI cervical spine with abnormal prevertebral edema.  Suggests possible infection but no definite osteomyelitis.  Consider neck CT. Transesophageal Echocardiogram performed and significant for resolution of prior TV vegetation. No new vegetation. PFO noted. Continue cefazolin Infectious disease recommendations: Cefazolin x8 weeks Pt with TTP of lumbar spine, will follow up MRI lumbar spine.  She's noted to have some soreness of her R neck as well for the past couple of days, will hold off on reimaging for now, consider CT if persistent.   Suicidal ideation Psychiatry initially recommended inpatient psych. On reevaluation, cleared from inpatient psych recommendations. Started on Effexor. Recommended having inpatient psychology see her, but this doesn't sound like something that's able to be done.    Opiate dependence Polysubstance abuse Patient desires abstinence from IV drug use.  No withdrawal symptoms -Clonidine taper -symptom control with atarax, robaxin, naproxen - continue suboxone -Continue hydroxyzine  Musculoskeletal Pain:  TTP of lumbar spine, imaging as above.  She's got some soreness of her R neck and slightly limited ROM.  For now, will treat this as muscle spasm, but ctm closely and reimage prn.  PFO Seen on Transesophageal Echocardiogram. Small.  Insomnia  RLS: pt notes she takes gabapentin for this.  Will increase nighttime dose to 600 mg.  Thrombocytosis: likely reactive, follow (?ancef per ID)  DVT prophylaxis: lovenox Code Status: full  Family Communication: none at bedside Disposition Plan: pending IV abx treatment   Consultants:   Infectious disease  Psychiatry  Procedures: TEE 4/22  IMPRESSION:   1. No evidence for endocarditis. The previously noted TV vegetation has resolved. 2. No LAA thrombus 3. Very small PFO noted with saline microbubble contrast 4. LVEF 55-60%  Antimicrobials:  Anti-infectives (From admission, onward)   Start     Dose/Rate Route Frequency Ordered Stop   09/22/17 0530  ceFAZolin (ANCEF) IVPB 2g/100 mL premix     2 g 200 mL/hr over 30 Minutes Intravenous Every 8 hours 09/22/17 0505       Subjective: C/o neck stiffness, soreness.  Limited ROM to right. C/o low back pain.   Asking for gabapentin for sleep.  Objective: Vitals:   09/28/17 2053 09/29/17 0525 09/29/17 0815 09/29/17 1458  BP: 123/70 108/71 113/74 116/70  Pulse: 75 64  66  Resp: 16 16  16   Temp: 98 F (36.7 C) 98.1 F (36.7 C)  98 F (36.7 C)  TempSrc: Oral Oral  Oral  SpO2: 99% 97%  98%  Weight:      Height:        Intake/Output Summary (Last  24 hours) at 09/29/2017 1508 Last data filed at 09/29/2017 1100 Gross per 24 hour  Intake 540 ml  Output -  Net 540 ml   Filed Weights   09/23/17 0430 09/24/17 2012 09/27/17 0719  Weight: 46.9 kg (103 lb 6.3 oz) 49.9 kg (110 lb) 49.9 kg (110 lb)    Examination:  General  exam: Appears calm and comfortable  Respiratory system: Clear to auscultation. Respiratory effort normal. Cardiovascular system: S1 & S2 heard, RRR. No JVD, murmurs, rubs, gallops or clicks. No pedal edema. Gastrointestinal system: Abdomen is nondistended, soft and nontender. No organomegaly or masses felt. Normal bowel sounds heard. Central nervous system: Alert and oriented. No focal neurological deficits. MSK: no midline TTP to cervical or thoracic spine.  Some TTP to lumbar spine. Skin: No rashes, lesions or ulcers Psychiatry: Judgement and insight appear normal. Mood & affect appropriate.   Data Reviewed: I have personally reviewed following labs and imaging studies  CBC: Recent Labs  Lab 09/23/17 0547 09/27/17 0623  WBC 7.0 9.4  HGB 9.7* 11.0*  HCT 32.0* 36.6  MCV 77.1* 78.2  PLT 555* 671*   Basic Metabolic Panel: Recent Labs  Lab 09/23/17 0547 09/27/17 0623  NA 138 136  K 3.9 4.3  CL 107 105  CO2 22 23  GLUCOSE 117* 85  BUN 8 12  CREATININE 0.41* 0.51  CALCIUM 8.6* 9.1   GFR: Estimated Creatinine Clearance: 77.6 mL/min (by C-G formula based on SCr of 0.51 mg/dL). Liver Function Tests: No results for input(s): AST, ALT, ALKPHOS, BILITOT, PROT, ALBUMIN in the last 168 hours. No results for input(s): LIPASE, AMYLASE in the last 168 hours. No results for input(s): AMMONIA in the last 168 hours. Coagulation Profile: No results for input(s): INR, PROTIME in the last 168 hours. Cardiac Enzymes: No results for input(s): CKTOTAL, CKMB, CKMBINDEX, TROPONINI in the last 168 hours. BNP (last 3 results) No results for input(s): PROBNP in the last 8760 hours. HbA1C: No results for input(s): HGBA1C in the last 72 hours. CBG: No results for input(s): GLUCAP in the last 168 hours. Lipid Profile: No results for input(s): CHOL, HDL, LDLCALC, TRIG, CHOLHDL, LDLDIRECT in the last 72 hours. Thyroid Function Tests: No results for input(s): TSH, T4TOTAL, FREET4, T3FREE,  THYROIDAB in the last 72 hours. Anemia Panel: No results for input(s): VITAMINB12, FOLATE, FERRITIN, TIBC, IRON, RETICCTPCT in the last 72 hours. Sepsis Labs: No results for input(s): PROCALCITON, LATICACIDVEN in the last 168 hours.  Recent Results (from the past 240 hour(s))  Culture, blood (routine x 2)     Status: Abnormal   Collection Time: 09/20/17  8:49 AM  Result Value Ref Range Status   Specimen Description BLOOD LEFT HAND  Final   Special Requests   Final    BOTTLES DRAWN AEROBIC AND ANAEROBIC Blood Culture adequate volume   Culture  Setup Time   Final    GRAM POSITIVE COCCI IN BOTH AEROBIC AND ANAEROBIC BOTTLES CRITICAL VALUE NOTED.  VALUE IS CONSISTENT WITH PREVIOUSLY REPORTED AND CALLED VALUE.    Culture (Jomes Giraldo)  Final    STAPHYLOCOCCUS AUREUS SUSCEPTIBILITIES PERFORMED ON PREVIOUS CULTURE WITHIN THE LAST 5 DAYS. Performed at [facility], [place] [address] [place], [place] [address]    Report Status 09/23/2017 FINAL  Final  Culture, blood (routine x 2)     Status: Abnormal   Collection Time: 09/20/17  9:04 AM  Result Value Ref Range Status   Specimen Description BLOOD LEFT ANTECUBITAL  Final  Special Requests   Final    BOTTLES DRAWN AEROBIC AND ANAEROBIC Blood Culture adequate volume   Culture  Setup Time   Final    GRAM POSITIVE COCCI IN BOTH AEROBIC AND ANAEROBIC BOTTLES Organism ID to follow CRITICAL RESULT CALLED TO, READ BACK BY AND VERIFIED WITH: Karsten [facility] 09/21/17 0056 JDW Performed at [facility], [place] [address] [place], [place] [address]    Culture STAPHYLOCOCCUS AUREUS (Joice Nazario)  Final   Report Status 09/22/2017 FINAL  Final   Organism ID, Bacteria STAPHYLOCOCCUS AUREUS  Final      Susceptibility   Staphylococcus aureus - MIC*    CIPROFLOXACIN <=0.5 SENSITIVE Sensitive     ERYTHROMYCIN <=0.25 SENSITIVE Sensitive     GENTAMICIN <=0.5 SENSITIVE Sensitive     OXACILLIN 0.5 SENSITIVE Sensitive     TETRACYCLINE <=1 SENSITIVE Sensitive      VANCOMYCIN <=0.5 SENSITIVE Sensitive     TRIMETH/SULFA <=10 SENSITIVE Sensitive     CLINDAMYCIN <=0.25 SENSITIVE Sensitive     RIFAMPIN <=0.5 SENSITIVE Sensitive     Inducible Clindamycin NEGATIVE Sensitive     * STAPHYLOCOCCUS AUREUS  Blood Culture ID Panel (Reflexed)     Status: Abnormal   Collection Time: 09/20/17  9:04 AM  Result Value Ref Range Status   Enterococcus species NOT DETECTED NOT DETECTED Final   Listeria monocytogenes NOT DETECTED NOT DETECTED Final   Staphylococcus species DETECTED (Jerrett Baldinger) NOT DETECTED Final    Comment: CRITICAL RESULT CALLED TO, READ BACK BY AND VERIFIED WITH: J LEDFORD PHARMD 09/21/17 0056 JDW    Staphylococcus aureus DETECTED (Marita Burnsed) NOT DETECTED Final    Comment: Methicillin (oxacillin) susceptible Staphylococcus aureus (MSSA). Preferred therapy is anti staphylococcal beta lactam antibiotic (Cefazolin or Nafcillin), unless clinically contraindicated. CRITICAL RESULT CALLED TO, READ BACK BY AND VERIFIED WITH: J LEDFORD [facility] 09/21/17 0056 JDW    Methicillin resistance NOT DETECTED NOT DETECTED Final   Streptococcus species NOT DETECTED NOT DETECTED Final   Streptococcus agalactiae NOT DETECTED NOT DETECTED Final   Streptococcus pneumoniae NOT DETECTED NOT DETECTED Final   Streptococcus pyogenes NOT DETECTED NOT DETECTED Final   Acinetobacter baumannii NOT DETECTED NOT DETECTED Final   Enterobacteriaceae species NOT DETECTED NOT DETECTED Final   Enterobacter cloacae complex NOT DETECTED NOT DETECTED Final   Escherichia coli NOT DETECTED NOT DETECTED Final   Klebsiella oxytoca NOT DETECTED NOT DETECTED Final   Klebsiella pneumoniae NOT DETECTED NOT DETECTED Final   Proteus species NOT DETECTED NOT DETECTED Final   Serratia marcescens NOT DETECTED NOT DETECTED Final   Haemophilus influenzae NOT DETECTED NOT DETECTED Final   Neisseria meningitidis NOT DETECTED NOT DETECTED Final   Pseudomonas aeruginosa NOT DETECTED NOT DETECTED Final   Candida albicans  NOT DETECTED NOT DETECTED Final   Candida glabrata NOT DETECTED NOT DETECTED Final   Candida krusei NOT DETECTED NOT DETECTED Final   Candida parapsilosis NOT DETECTED NOT DETECTED Final   Candida tropicalis NOT DETECTED NOT DETECTED Final    Comment: Performed at [facility], [place]. [address], [place],  [address]  Culture, blood (single)     Status: None   Collection Time: 09/21/17  8:10 AM  Result Value Ref Range Status   Specimen Description BLOOD HAND  Final   Special Requests   Final    BOTTLES DRAWN AEROBIC ONLY Blood Culture adequate volume   Culture   Final    NO GROWTH 5 DAYS Performed at [facility], [place]  [address], [place], [place] [address]    Report Status 09/26/2017 FINAL  Final  MRSA PCR Screening     Status: None   Collection Time: 09/23/17  4:28 AM  Result Value Ref Range Status   MRSA by PCR NEGATIVE NEGATIVE Final    Comment:        The GeneXpert MRSA Assay (FDA approved for NASAL specimens only), is one component of [facility] [place] comprehensive MRSA colonization surveillance program. It is not intended to diagnose MRSA infection nor to guide or monitor treatment for MRSA infections. Performed at [facility], [place] [address], [place], [place] [address]          Radiology Studies: No results found.      Scheduled Meds: . buprenorphine-naloxone  1 tablet Sublingual BID  . cloNIDine  0.1 mg Oral QAC breakfast  . enoxaparin (LOVENOX) injection  40 mg Subcutaneous Q24H  . gabapentin  200 mg Oral TID  . gabapentin  400 mg Oral QHS  . polyethylene glycol  17 g Oral Daily  . sodium chloride flush  3 mL Intravenous Q12H  . venlafaxine XR  75 mg Oral Q breakfast   Continuous Infusions: . sodium chloride 10 mL/hr at 09/24/17 2116  .  ceFAZolin (ANCEF) IV Stopped (09/29/17 1410)     LOS: 7 days    Time spent: over 30 min    Fayrene Helper, MD [facility] Pager [phone]  If 7PM-7AM, please contact  night-coverage [email or web address] Password [facility] 09/29/2017, 3:08 PM

## 2017-09-29 NOTE — Plan of Care (Signed)
  Problem: Health Behavior/Discharge Planning: Goal: Ability to manage health-related needs will improve Outcome: Progressing   Problem: Clinical Measurements: Goal: Ability to maintain clinical measurements within normal limits will improve Outcome: Progressing Goal: Will remain free from infection Outcome: Progressing   

## 2017-09-30 DIAGNOSIS — F1910 Other psychoactive substance abuse, uncomplicated: Secondary | ICD-10-CM

## 2017-09-30 LAB — CBC
HCT: 35.2 % — ABNORMAL LOW (ref 36.0–46.0)
Hemoglobin: 10.8 g/dL — ABNORMAL LOW (ref 12.0–15.0)
MCH: 24.2 pg — ABNORMAL LOW (ref 26.0–34.0)
MCHC: 30.7 g/dL (ref 30.0–36.0)
MCV: 78.7 fL (ref 78.0–100.0)
Platelets: 533 10*3/uL — ABNORMAL HIGH (ref 150–400)
RBC: 4.47 MIL/uL (ref 3.87–5.11)
RDW: 15.9 % — ABNORMAL HIGH (ref 11.5–15.5)
WBC: 7.9 10*3/uL (ref 4.0–10.5)

## 2017-09-30 LAB — COMPREHENSIVE METABOLIC PANEL
ALBUMIN: 3.4 g/dL — AB (ref 3.5–5.0)
ALT: 18 U/L (ref 14–54)
AST: 22 U/L (ref 15–41)
Alkaline Phosphatase: 97 U/L (ref 38–126)
Anion gap: 10 (ref 5–15)
BUN: 11 mg/dL (ref 6–20)
CO2: 23 mmol/L (ref 22–32)
CREATININE: 0.5 mg/dL (ref 0.44–1.00)
Calcium: 8.9 mg/dL (ref 8.9–10.3)
Chloride: 103 mmol/L (ref 101–111)
GFR calc Af Amer: >60 mL/min (ref >60)
GFR calc non Af Amer: >60 mL/min (ref >60)
Glucose, Bld: 121 mg/dL — ABNORMAL HIGH (ref 65–99)
Potassium: 4 mmol/L (ref 3.5–5.1)
SODIUM: 136 mmol/L (ref 135–145)
Total Bilirubin: 0.3 mg/dL (ref 0.3–1.2)
Total Protein: 7.7 g/dL (ref 6.5–8.1)

## 2017-09-30 MED ORDER — SODIUM CHLORIDE 0.9% FLUSH
10.0000 mL | Freq: Two times a day (BID) | INTRAVENOUS | Status: DC
  Administered 2017-10-01: 10 mL

## 2017-09-30 MED ORDER — SODIUM CHLORIDE 0.9% FLUSH: 10.0000 mL | INTRAVENOUS | Status: DC | PRN

## 2017-09-30 NOTE — Social Work (Signed)
Pt no longer under IVC as of 4/23, and is no longer warranting inpatient psychiatric placement (cleared by psychiatry and sitter discontinued on 4/22).   CSW has paged MD, sticky note on chart reflects these updates.   CSW available for pt if needed for support. Alexander Mt, S[employer]P.J[employer] Work [phone]

## 2017-09-30 NOTE — Progress Notes (Signed)
INFECTIOUS DISEASE PROGRESS NOTE  ID: Sherri Francis is a 32 y.o. female with  Principal Problem:   Acute osteomyelitis of cervical spine ([place]) Active Problems:   Bacteremia due to Staphylococcus aureus   Opioid type dependence, continuous (HCC)   Osteomyelitis of cervical spine (HCC)   Anxiety and depression  Subjective: No complaints.  Some neck pain with specific movements (she is up in bathroom after showering. In no distress).   Abtx:  Anti-infectives (From admission, onward)   Start     Dose/Rate Route Frequency Ordered Stop   09/22/17 0530  ceFAZolin (ANCEF) IVPB 2g/100 mL premix     2 g 200 mL/hr over 30 Minutes Intravenous Every 8 hours 09/22/17 0505        Medications:  Scheduled: . buprenorphine-naloxone  1 tablet Sublingual BID  . enoxaparin (LOVENOX) injection  40 mg Subcutaneous Q24H  . gabapentin  200 mg Oral TID  . gabapentin  400 mg Oral QHS  . lidocaine  1 patch Transdermal Q24H  . polyethylene glycol  17 g Oral Daily  . sodium chloride flush  3 mL Intravenous Q12H  . venlafaxine XR  75 mg Oral Q breakfast    Objective: Vital signs in last 24 hours: Temp:  [97.8 F (36.6 C)-98 F (36.7 C)] 97.8 F (36.6 C) (04/25 0600) Pulse Rate:  [66-78] 73 (04/25 0600) Resp:  [16] 16 (04/25 0600) BP: (111-116)/(70-72) 111/72 (04/25 0600) SpO2:  [98 %-99 %] 99 % (04/25 0600)   General appearance: alert and no distress Resp: clear to auscultation bilaterally Cardio: regular rate and rhythm GI: normal findings: bowel sounds normal and soft, non-tender Extremities: edema none  Lab Results Recent Labs    09/30/17 0708 09/30/17 0945  WBC 7.9  --   HGB 10.8*  --   HCT 35.2*  --   NA  --  136  K  --  4.0  CL  --  103  CO2  --  23  BUN  --  11  CREATININE  --  0.50   Liver Panel Recent Labs    09/30/17 0945  PROT 7.7  ALBUMIN 3.4*  AST 22  ALT 18  ALKPHOS 97  BILITOT 0.3   Sedimentation Rate No results for input(s): ESRSEDRATE in the last  72 hours. C-Reactive Protein No results for input(s): CRP in the last 72 hours.  Microbiology: Recent Results (from the past 240 hour(s))  Culture, blood (single)     Status: None   Collection Time: 09/21/17  8:10 AM  Result Value Ref Range Status   Specimen Description BLOOD HAND  Final   Special Requests   Final    BOTTLES DRAWN AEROBIC ONLY Blood Culture adequate volume   Culture   Final    NO GROWTH 5 DAYS Performed at [facility], [address] [place], [place] [address]    Report Status 09/26/2017 FINAL  Final  MRSA PCR Screening     Status: None   Collection Time: 09/23/17  4:28 AM  Result Value Ref Range Status   MRSA by PCR NEGATIVE NEGATIVE Final    Comment:        The GeneXpert MRSA Assay (FDA approved for NASAL specimens only), is one component of a comprehensive MRSA colonization surveillance program. It is not intended to diagnose MRSA infection nor to guide or monitor treatment for MRSA infections. Performed at [facility], [place] [address], [place], [place] [address]   Culture, blood (routine x  2)     Status: None (Preliminary result)   Collection Time: 09/28/17 11:30 AM  Result Value Ref Range Status   Specimen Description BLOOD RIGHT HAND  Final   Special Requests   Final    BOTTLES DRAWN AEROBIC ONLY Blood Culture adequate volume   Culture   Final    NO GROWTH 1 DAY Performed at [facility], [address] [place], [place] [address]    Report Status PENDING  Incomplete  Culture, blood (routine x 2)     Status: None (Preliminary result)   Collection Time: 09/28/17 11:45 AM  Result Value Ref Range Status   Specimen Description BLOOD LEFT HAND  Final   Special Requests   Final    BOTTLES DRAWN AEROBIC ONLY Blood Culture adequate volume   Culture   Final    NO GROWTH 1 DAY Performed at [facility], [place] [address], [place], [place] [address]    Report Status PENDING  Incomplete    Studies/Results: Mr Lumbar Spine W  Wo Contrast  Result Date: 09/29/2017 CLINICAL DATA:  32 y/o F; back pain with history of polysubstance/IV drug use, concern for infection. EXAM: MRI LUMBAR SPINE WITHOUT AND WITH CONTRAST TECHNIQUE: Multiplanar and multiecho pulse sequences of the lumbar spine were obtained without and with intravenous contrast. CONTRAST:  52mL MULTIHANCE GADOBENATE DIMEGLUMINE 529 MG/ML IV SOLN COMPARISON:  02/07/2017 CT abdomen and pelvis. FINDINGS: Segmentation:  Standard. Alignment:  Physiologic. Vertebrae: No fracture, evidence of discitis, or bone lesion. No abnormal enhancement. Small L1 inferior endplate chronic Schmorl's node. Conus medullaris and cauda equina: Conus extends to the L1 level. Conus and cauda equina appear normal. No abnormal enhancement. Paraspinal and other soft tissues: 4 mm cyst in the lower pole of right kidney. Disc levels: No significant disc displacement, foraminal stenosis, or canal stenosis. IMPRESSION: Negative lumbar spine MRI.  No evidence of discitis osteomyelitis. Electronically Signed   By: [name] M.D.   On: 09/29/2017 22:21     Assessment/Plan: C spine discitis MSSA bacteremia IVDA Hep C 1a   Total days of antibiotics: 8 ancef  She is doing well on anbx and on suboxone.  She denies cravings.  She states she wants to stay her for the duration of her therapy. Hep C rx as outpt.   Will continue to follow.          Bobby Rumpf MD, FACP Infectious Diseases (pager) [phone] [email or web address] 09/30/2017, 12:17 PM  LOS: 8 days

## 2017-09-30 NOTE — Progress Notes (Signed)
PROGRESS NOTE    Sherri Francis  ZYS:[id number] DOB: 1986/01/02 DOA: 09/22/2017 PCP: Patient, No Pcp Per   Brief Narrative:  Sherri Francis is Sherri Francis 32 y.o. female with Kaeden Mester history of polysubstance/IV drug use, hepatitis C, MSSA endocarditis. She presented after leaving AMA multiple times in the last 8 months for incomplete treatment of her MSSA endocarditis. She is currently IVCd with recommendations for inpatient behavioral health admission. Patient refusing suboxone therapy. Clonidine taper for withdrawal.  Assessment & Plan:   Principal Problem:   Acute osteomyelitis of cervical spine (HCC) Active Problems:   Bacteremia due to Staphylococcus aureus   Opioid type dependence, continuous (HCC)   Osteomyelitis of cervical spine (HCC)   Anxiety and depression   ?Acute osteomyelitis of cervical spine Bacteremia/Endocarditis secondary to MSSA Patient recently left AMA. Previously IVCd, but most recently psychiatrically cleared.  Repeat blood culture from 4/16 (single), no growth. Repeat blood cx from 4/23 NGTD x 2 Planning for midline 4/25 MRI cervical spine with abnormal prevertebral edema.  Suggests possible infection but no definite osteomyelitis.  Consider neck CT. Transesophageal Echocardiogram performed and significant for resolution of prior TV vegetation. No new vegetation. PFO noted. Continue cefazolin Infectious disease recommendations: Cefazolin x8 weeks Pt with TTP of lumbar spine, will follow up MRI lumbar spine (negative).   She's noted to have some soreness of her R neck as well for the past couple of days, will hold off on reimaging for now, consider CT if persistent (seems to be improving).   Suicidal ideation Psychiatry initially recommended inpatient psych. On reevaluation, cleared from inpatient psych recommendations. Started on Effexor. Recommended having inpatient psychology see her, but this doesn't sound like something that's able to be done.    Opiate  dependence Polysubstance abuse Patient desires abstinence from IV drug use. No withdrawal symptoms -Clonidine taper -symptom control with atarax, robaxin, naproxen - continue suboxone -Continue hydroxyzine  Hep C: ID planning to treat as outpatient.  Musculoskeletal Pain:  TTP of lumbar spine, imaging as above.  She's got some soreness of her R neck and slightly limited ROM.  For now, will treat this as muscle spasm, but ctm closely and reimage prn.  Seems improved.  Follow.  PFO Seen on Transesophageal Echocardiogram. Small.  Insomnia  RLS: pt notes she takes gabapentin for this.  Will increase nighttime dose to 600 mg.  She did well on this, continue.  Thrombocytosis: likely reactive, follow (?ancef per ID).  CTM.   DVT prophylaxis: lovenox Code Status: full  Family Communication: none at bedside Disposition Plan: pending IV abx treatment   Consultants:   Infectious disease  Psychiatry  Procedures: TEE 4/22  IMPRESSION:   1. No evidence for endocarditis. The previously noted TV vegetation has resolved. 2. No LAA thrombus 3. Very small PFO noted with saline microbubble contrast 4. LVEF 55-60%  Antimicrobials:  Anti-infectives (From admission, onward)   Start     Dose/Rate Route Frequency Ordered Stop   09/22/17 0530  ceFAZolin (ANCEF) IVPB 2g/100 mL premix     2 g 200 mL/hr over 30 Minutes Intravenous Every 8 hours 09/22/17 0505       Subjective: Neck stiffness Kimmy Totten bit better. Back pain still present.  Meds we added yesterday helped.  Objective: Vitals:   09/29/17 1458 09/29/17 2242 09/30/17 0600 09/30/17 1309  BP: 116/70 111/71 111/72 126/78  Pulse: 66 78 73 87  Resp: 16 16 16    Temp: 98 F (36.7 C) 98 F (36.7 C) 97.8 F (36.6 C) 98  F (36.7 C)  TempSrc: Oral Oral Oral Oral  SpO2: 98% 99% 99% 99%  Weight:      Height:        Intake/Output Summary (Last 24 hours) at 09/30/2017 1616 Last data filed at 09/30/2017 0900 Gross per 24 hour  Intake  670 ml  Output -  Net 670 ml   Filed Weights   09/23/17 0430 09/24/17 2012 09/27/17 0719  Weight: 46.9 kg (103 lb 6.3 oz) 49.9 kg (110 lb) 49.9 kg (110 lb)    Examination:  General: No acute distress. Cardiovascular: Heart sounds show Neeley Sedivy regular rate, and rhythm. No gallops or rubs. No murmurs. No JVD. Lungs: Clear to auscultation bilaterally with good air movement. No rales, rhonchi or wheezes. Abdomen: Soft, nontender, nondistended with normal active bowel sounds. No masses. No hepatosplenomegaly. Neurological: Alert and oriented 3. Moves all extremities 4. Cranial nerves II through XII grossly intact. MSK: TTP to lumbar spine.  No midline TTP to cervical spine. Improved ROM of neck. Skin: Warm and dry. No rashes or lesions. Extremities: No clubbing or cyanosis. No edema. Psychiatric: Mood and affect are normal. Insight and judgment are appropirate.  Data Reviewed: I have personally reviewed following labs and imaging studies  CBC: Recent Labs  Lab 09/27/17 0623 09/30/17 0708  WBC 9.4 7.9  HGB 11.0* 10.8*  HCT 36.6 35.2*  MCV 78.2 78.7  PLT 677* 161*   Basic Metabolic Panel: Recent Labs  Lab 09/27/17 0623 09/30/17 0945  NA 136 136  K 4.3 4.0  CL 105 103  CO2 23 23  GLUCOSE 85 121*  BUN 12 11  CREATININE 0.51 0.50  CALCIUM 9.1 8.9   GFR: Estimated Creatinine Clearance: 77.6 mL/min (by C-G formula based on SCr of 0.5 mg/dL). Liver Function Tests: Recent Labs  Lab 09/30/17 0945  AST 22  ALT 18  ALKPHOS 97  BILITOT 0.3  PROT 7.7  ALBUMIN 3.4*   No results for input(s): LIPASE, AMYLASE in the last 168 hours. No results for input(s): AMMONIA in the last 168 hours. Coagulation Profile: No results for input(s): INR, PROTIME in the last 168 hours. Cardiac Enzymes: No results for input(s): CKTOTAL, CKMB, CKMBINDEX, TROPONINI in the last 168 hours. BNP (last 3 results) No results for input(s): PROBNP in the last 8760 hours. HbA1C: No results for input(s):  HGBA1C in the last 72 hours. CBG: No results for input(s): GLUCAP in the last 168 hours. Lipid Profile: No results for input(s): CHOL, HDL, LDLCALC, TRIG, CHOLHDL, LDLDIRECT in the last 72 hours. Thyroid Function Tests: No results for input(s): TSH, T4TOTAL, FREET4, T3FREE, THYROIDAB in the last 72 hours. Anemia Panel: No results for input(s): VITAMINB12, FOLATE, FERRITIN, TIBC, IRON, RETICCTPCT in the last 72 hours. Sepsis Labs: No results for input(s): PROCALCITON, LATICACIDVEN in the last 168 hours.  Recent Results (from the past 240 hour(s))  Culture, blood (single)     Status: None   Collection Time: 09/21/17  8:10 AM  Result Value Ref Range Status   Specimen Description BLOOD HAND  Final   Special Requests   Final    BOTTLES DRAWN AEROBIC ONLY Blood Culture adequate volume   Culture   Final    NO GROWTH 5 DAYS Performed at [facility], [address], [place], [place] [address]    Report Status 09/26/2017 FINAL  Final  MRSA PCR Screening     Status: None   Collection Time: 09/23/17  4:28 AM  Result Value Ref Range Status  MRSA by PCR NEGATIVE NEGATIVE Final    Comment:        The GeneXpert MRSA Assay (FDA approved for NASAL specimens only), is one component of Suhail Peloquin comprehensive MRSA colonization surveillance program. It is not intended to diagnose MRSA infection nor to guide or monitor treatment for MRSA infections. Performed at [facility], [place] [address] [place], [place] [address]   Culture, blood (routine x 2)     Status: None (Preliminary result)   Collection Time: 09/28/17 11:30 AM  Result Value Ref Range Status   Specimen Description BLOOD RIGHT HAND  Final   Special Requests   Final    BOTTLES DRAWN AEROBIC ONLY Blood Culture adequate volume   Culture   Final    NO GROWTH 2 DAYS Performed at [facility], [place] [address], [place], [place] [address]    Report Status PENDING  Incomplete  Culture, blood (routine x 2)     Status: None  (Preliminary result)   Collection Time: 09/28/17 11:45 AM  Result Value Ref Range Status   Specimen Description BLOOD LEFT HAND  Final   Special Requests   Final    BOTTLES DRAWN AEROBIC ONLY Blood Culture adequate volume   Culture   Final    NO GROWTH 2 DAYS Performed at [facility], [place] [address], [place], [place] [address]    Report Status PENDING  Incomplete         Radiology Studies: Mr Lumbar Spine W Wo Contrast  Result Date: 09/29/2017 CLINICAL DATA:  32 y/o F; back pain with history of polysubstance/IV drug use, concern for infection. EXAM: MRI LUMBAR SPINE WITHOUT AND WITH CONTRAST TECHNIQUE: Multiplanar and multiecho pulse sequences of the lumbar spine were obtained without and with intravenous contrast. CONTRAST:  63mL MULTIHANCE GADOBENATE DIMEGLUMINE 529 MG/ML IV SOLN COMPARISON:  02/07/2017 CT abdomen and pelvis. FINDINGS: Segmentation:  Standard. Alignment:  Physiologic. Vertebrae: No fracture, evidence of discitis, or bone lesion. No abnormal enhancement. Small L1 inferior endplate chronic Schmorl's node. Conus medullaris and cauda equina: Conus extends to the L1 level. Conus and cauda equina appear normal. No abnormal enhancement. Paraspinal and other soft tissues: 4 mm cyst in the lower pole of right kidney. Disc levels: No significant disc displacement, foraminal stenosis, or canal stenosis. IMPRESSION: Negative lumbar spine MRI.  No evidence of discitis osteomyelitis. Electronically Signed   By: [name] M.D.   On: 09/29/2017 22:21        Scheduled Meds: . buprenorphine-naloxone  1 tablet Sublingual BID  . enoxaparin (LOVENOX) injection  40 mg Subcutaneous Q24H  . gabapentin  200 mg Oral TID  . gabapentin  400 mg Oral QHS  . lidocaine  1 patch Transdermal Q24H  . polyethylene glycol  17 g Oral Daily  . sodium chloride flush  10-40 mL Intracatheter Q12H  . sodium chloride flush  3 mL Intravenous Q12H  . venlafaxine XR  75 mg Oral Q  breakfast   Continuous Infusions: . sodium chloride 10 mL/hr at 09/24/17 2116  .  ceFAZolin (ANCEF) IV 2 g (09/30/17 1353)     LOS: 8 days    Time spent: over 30 min    Fayrene Helper, MD [facility] Pager [phone]  If 7PM-7AM, please contact night-coverage [email or web address] Password [facility] 09/30/2017, 4:16 PM

## 2017-10-01 LAB — HCV RNA QUANT
HCV QUANT: 762000 [IU]/mL (ref >50)
HCV Quantitative Log: 5.882 log10 IU/mL (ref >1.70)

## 2017-10-01 LAB — HIV ANTIBODY (ROUTINE TESTING W REFLEX): HIV Screen 4th Generation wRfx: NONREACTIVE

## 2017-10-01 NOTE — Progress Notes (Signed)
PROGRESS NOTE    Sherri Francis  TML:[id number] DOB: December 25, 1985 DOA: 09/22/2017 PCP: Patient, No Pcp Per   Brief Narrative:  Sherri Francis is a 32 y.o. female with a history of polysubstance/IV drug use, hepatitis C, MSSA endocarditis. She presented after leaving AMA multiple times in the last 8 months for incomplete treatment of her MSSA endocarditis. She is currently IVCd with recommendations for inpatient behavioral health admission. Patient refusing suboxone therapy. Clonidine taper for withdrawal.  Assessment & Plan:   Principal Problem:   Acute osteomyelitis of cervical spine (HCC) Active Problems:   Bacteremia due to Staphylococcus aureus   Opioid type dependence, continuous (HCC)   Osteomyelitis of cervical spine (HCC)   Anxiety and depression   ?Acute osteomyelitis of cervical spine Bacteremia/Endocarditis secondary to MSSA Patient recently left AMA. Previously IVCd, but most recently psychiatrically cleared.  Repeat blood culture from 4/16 (single), no growth. Repeat blood cx from 4/23 NGTD x 2 Planning for midline 4/25 MRI cervical spine with abnormal prevertebral edema.  Suggests possible infection but no definite osteomyelitis.  Consider neck CT. Transesophageal Echocardiogram performed and significant for resolution of prior TV vegetation. No new vegetation. PFO noted. Continue cefazolin Infectious disease recommendations: Cefazolin x8 weeks Pt with TTP of lumbar spine, will follow up MRI lumbar spine (negative).   She's noted to have some soreness of her R neck as well for the past couple of days, will hold off on reimaging for now, consider CT if persistent (seems to be improving).   Suicidal ideation Psychiatry initially recommended inpatient psych. On reevaluation, cleared from inpatient psych recommendations. Started on Effexor. Recommended having inpatient psychology see her, but this doesn't sound like something that's able to be done.    Opiate  dependence Polysubstance abuse Patient desires abstinence from IV drug use. No withdrawal symptoms -Clonidine taper -symptom control with atarax, robaxin, naproxen - continue suboxone -Continue hydroxyzine  Hep C: ID planning to treat as outpatient.  Musculoskeletal Pain:  TTP of lumbar spine, imaging as above.  She's got some soreness of her R neck and slightly limited ROM.  For now, will treat this as muscle spasm, but ctm closely and reimage prn.  Improving.   PFO Seen on Transesophageal Echocardiogram. Small.  Insomnia  RLS: pt notes she takes gabapentin for this.  Will increase nighttime dose to 600 mg.  Stable.  Thrombocytosis: likely reactive, follow (?ancef per ID).  CTM.   DVT prophylaxis: lovenox Code Status: full  Family Communication: none at bedside Disposition Plan: pending IV abx treatment   Consultants:   Infectious disease  Psychiatry  Procedures: TEE 4/22  IMPRESSION:   1. No evidence for endocarditis. The previously noted TV vegetation has resolved. 2. No LAA thrombus 3. Very small PFO noted with saline microbubble contrast 4. LVEF 55-60%  Antimicrobials:  Anti-infectives (From admission, onward)   Start     Dose/Rate Route Frequency Ordered Stop   09/22/17 0530  ceFAZolin (ANCEF) IVPB 2g/100 mL premix     2 g 200 mL/hr over 30 Minutes Intravenous Every 8 hours 09/22/17 0505       Subjective: Neck stiffness better. Doing ok.  Objective: Vitals:   09/30/17 0600 09/30/17 1309 09/30/17 2056 10/01/17 0436  BP: 111/72 126/78 (!) 124/55 119/83  Pulse: 73 87 74 77  Resp: 16   16  Temp: 97.8 F (36.6 C) 98 F (36.7 C) 98.3 F (36.8 C) 98.3 F (36.8 C)  TempSrc: Oral Oral Oral Oral  SpO2: 99% 99% 98% 98%  Weight:      Height:        Intake/Output Summary (Last 24 hours) at 10/01/2017 1747 Last data filed at 10/01/2017 1700 Gross per 24 hour  Intake 1380 ml  Output -  Net 1380 ml   Filed Weights   09/23/17 0430 09/24/17 2012  09/27/17 0719  Weight: 46.9 kg (103 lb 6.3 oz) 49.9 kg (110 lb) 49.9 kg (110 lb)    Examination:  General: No acute distress. Cardiovascular: Heart sounds show a regular rate, and rhythm. No gallops or rubs. No murmurs. No JVD. Lungs: Clear to auscultation bilaterally with good air movement. No rales, rhonchi or wheezes. Abdomen: Soft, nontender, nondistended with normal active bowel sounds. No masses. No hepatosplenomegaly. Neurological: Alert and oriented 3. Moves all extremities 4 Cranial nerves II through XII grossly intact. Skin: Warm and dry. No rashes or lesions. MSK: improved ROM of neck Extremities: No clubbing or cyanosis. No edema.  Psychiatric: Mood and affect are normal. Insight and judgment are appropriate.   Data Reviewed: I have personally reviewed following labs and imaging studies  CBC: Recent Labs  Lab 09/27/17 0623 09/30/17 0708  WBC 9.4 7.9  HGB 11.0* 10.8*  HCT 36.6 35.2*  MCV 78.2 78.7  PLT 677* 932*   Basic Metabolic Panel: Recent Labs  Lab 09/27/17 0623 09/30/17 0945  NA 136 136  K 4.3 4.0  CL 105 103  CO2 23 23  GLUCOSE 85 121*  BUN 12 11  CREATININE 0.51 0.50  CALCIUM 9.1 8.9   GFR: Estimated Creatinine Clearance: 77.6 mL/min (by C-G formula based on SCr of 0.5 mg/dL). Liver Function Tests: Recent Labs  Lab 09/30/17 0945  AST 22  ALT 18  ALKPHOS 97  BILITOT 0.3  PROT 7.7  ALBUMIN 3.4*   No results for input(s): LIPASE, AMYLASE in the last 168 hours. No results for input(s): AMMONIA in the last 168 hours. Coagulation Profile: No results for input(s): INR, PROTIME in the last 168 hours. Cardiac Enzymes: No results for input(s): CKTOTAL, CKMB, CKMBINDEX, TROPONINI in the last 168 hours. BNP (last 3 results) No results for input(s): PROBNP in the last 8760 hours. HbA1C: No results for input(s): HGBA1C in the last 72 hours. CBG: No results for input(s): GLUCAP in the last 168 hours. Lipid Profile: No results for input(s):  CHOL, HDL, LDLCALC, TRIG, CHOLHDL, LDLDIRECT in the last 72 hours. Thyroid Function Tests: No results for input(s): TSH, T4TOTAL, FREET4, T3FREE, THYROIDAB in the last 72 hours. Anemia Panel: No results for input(s): VITAMINB12, FOLATE, FERRITIN, TIBC, IRON, RETICCTPCT in the last 72 hours. Sepsis Labs: No results for input(s): PROCALCITON, LATICACIDVEN in the last 168 hours.  Recent Results (from the past 240 hour(s))  MRSA PCR Screening     Status: None   Collection Time: 09/23/17  4:28 AM  Result Value Ref Range Status   MRSA by PCR NEGATIVE NEGATIVE Final    Comment:        The GeneXpert MRSA Assay (FDA approved for NASAL specimens only), is one component of a comprehensive MRSA colonization surveillance program. It is not intended to diagnose MRSA infection nor to guide or monitor treatment for MRSA infections. Performed at [facility], [place] [address], [place], [place] [address]   Culture, blood (routine x 2)     Status: None (Preliminary result)   Collection Time: 09/28/17 11:30 AM  Result Value Ref Range Status   Specimen Description BLOOD RIGHT HAND  Final   Special Requests  Final    BOTTLES DRAWN AEROBIC ONLY Blood Culture adequate volume   Culture   Final    NO GROWTH 3 DAYS Performed at [facility], [place] [address] [place], [place] [address]    Report Status PENDING  Incomplete  Culture, blood (routine x 2)     Status: None (Preliminary result)   Collection Time: 09/28/17 11:45 AM  Result Value Ref Range Status   Specimen Description BLOOD LEFT HAND  Final   Special Requests   Final    BOTTLES DRAWN AEROBIC ONLY Blood Culture adequate volume   Culture   Final    NO GROWTH 3 DAYS Performed at [facility], [place] [address] [place], [place] [address]    Report Status PENDING  Incomplete         Radiology Studies: Mr Lumbar Spine W Wo Contrast  Result Date: 09/29/2017 CLINICAL DATA:  32 y/o F; back pain with history of  polysubstance/IV drug use, concern for infection. EXAM: MRI LUMBAR SPINE WITHOUT AND WITH CONTRAST TECHNIQUE: Multiplanar and multiecho pulse sequences of the lumbar spine were obtained without and with intravenous contrast. CONTRAST:  52mL MULTIHANCE GADOBENATE DIMEGLUMINE 529 MG/ML IV SOLN COMPARISON:  02/07/2017 CT abdomen and pelvis. FINDINGS: Segmentation:  Standard. Alignment:  Physiologic. Vertebrae: No fracture, evidence of discitis, or bone lesion. No abnormal enhancement. Small L1 inferior endplate chronic Schmorl's node. Conus medullaris and cauda equina: Conus extends to the L1 level. Conus and cauda equina appear normal. No abnormal enhancement. Paraspinal and other soft tissues: 4 mm cyst in the lower pole of right kidney. Disc levels: No significant disc displacement, foraminal stenosis, or canal stenosis. IMPRESSION: Negative lumbar spine MRI.  No evidence of discitis osteomyelitis. Electronically Signed   By: [name] M.D.   On: 09/29/2017 22:21        Scheduled Meds: . buprenorphine-naloxone  1 tablet Sublingual BID  . enoxaparin (LOVENOX) injection  40 mg Subcutaneous Q24H  . gabapentin  200 mg Oral TID  . gabapentin  400 mg Oral QHS  . lidocaine  1 patch Transdermal Q24H  . polyethylene glycol  17 g Oral Daily  . sodium chloride flush  10-40 mL Intracatheter Q12H  . sodium chloride flush  3 mL Intravenous Q12H  . venlafaxine XR  75 mg Oral Q breakfast   Continuous Infusions: . sodium chloride 10 mL/hr at 09/24/17 2116  .  ceFAZolin (ANCEF) IV Stopped (10/01/17 1500)     LOS: 9 days    Time spent: over 30 min    Fayrene Helper, MD [facility] Pager [phone]  If 7PM-7AM, please contact night-coverage [email or web address] Password [facility] 10/01/2017, 5:47 PM

## 2017-10-01 NOTE — Plan of Care (Signed)
  Problem: Coping: Goal: Level of anxiety will decrease Outcome: Progressing   Problem: Coping: Goal: Level of anxiety will decrease Outcome: Progressing   

## 2017-10-01 NOTE — Progress Notes (Addendum)
INFECTIOUS DISEASE PROGRESS NOTE  ID: Sherri Francis is a 32 y.o. female with  Principal Problem:   Acute osteomyelitis of cervical spine ([place]) Active Problems:   Bacteremia due to Staphylococcus aureus   Opioid type dependence, continuous (HCC)   Osteomyelitis of cervical spine (HCC)   Anxiety and depression  Subjective: No complaints  Abtx:  Anti-infectives (From admission, onward)   Start     Dose/Rate Route Frequency Ordered Stop   09/22/17 0530  ceFAZolin (ANCEF) IVPB 2g/100 mL premix     2 g 200 mL/hr over 30 Minutes Intravenous Every 8 hours 09/22/17 0505        Medications:  Scheduled: . buprenorphine-naloxone  1 tablet Sublingual BID  . enoxaparin (LOVENOX) injection  40 mg Subcutaneous Q24H  . gabapentin  200 mg Oral TID  . gabapentin  400 mg Oral QHS  . lidocaine  1 patch Transdermal Q24H  . polyethylene glycol  17 g Oral Daily  . sodium chloride flush  10-40 mL Intracatheter Q12H  . sodium chloride flush  3 mL Intravenous Q12H  . venlafaxine XR  75 mg Oral Q breakfast    Objective: Vital signs in last 24 hours: Temp:  [98 F (36.7 C)-98.3 F (36.8 C)] 98.3 F (36.8 C) (04/26 0436) Pulse Rate:  [74-87] 77 (04/26 0436) Resp:  [16] 16 (04/26 0436) BP: (119-126)/(55-83) 119/83 (04/26 0436) SpO2:  [98 %-99 %] 98 % (04/26 0436)   General appearance: alert, cooperative and no distress Extremities: edema none and mild erythema, small area on R thumb. no pustulence or fluctuance.   Lab Results Recent Labs    09/30/17 0708 09/30/17 0945  WBC 7.9  --   HGB 10.8*  --   HCT 35.2*  --   NA  --  136  K  --  4.0  CL  --  103  CO2  --  23  BUN  --  11  CREATININE  --  0.50   Liver Panel Recent Labs    09/30/17 0945  PROT 7.7  ALBUMIN 3.4*  AST 22  ALT 18  ALKPHOS 97  BILITOT 0.3   Sedimentation Rate No results for input(s): ESRSEDRATE in the last 72 hours. C-Reactive Protein No results for input(s): CRP in the last 72  hours.  Microbiology: Recent Results (from the past 240 hour(s))  MRSA PCR Screening     Status: None   Collection Time: 09/23/17  4:28 AM  Result Value Ref Range Status   MRSA by PCR NEGATIVE NEGATIVE Final    Comment:        The GeneXpert MRSA Assay (FDA approved for NASAL specimens only), is one component of a comprehensive MRSA colonization surveillance program. It is not intended to diagnose MRSA infection nor to guide or monitor treatment for MRSA infections. Performed at [facility], [place] [address], [place], [place] [address]   Culture, blood (routine x 2)     Status: None (Preliminary result)   Collection Time: 09/28/17 11:30 AM  Result Value Ref Range Status   Specimen Description BLOOD RIGHT HAND  Final   Special Requests   Final    BOTTLES DRAWN AEROBIC ONLY Blood Culture adequate volume   Culture   Final    NO GROWTH 3 DAYS Performed at [facility], [place] [address] [place], [place] [address]    Report Status PENDING  Incomplete  Culture, blood (routine x 2)     Status: None (Preliminary result)  Collection Time: 09/28/17 11:45 AM  Result Value Ref Range Status   Specimen Description BLOOD LEFT HAND  Final   Special Requests   Final    BOTTLES DRAWN AEROBIC ONLY Blood Culture adequate volume   Culture   Final    NO GROWTH 3 DAYS Performed at [facility], [address] [place], [place] [address]    Report Status PENDING  Incomplete    Studies/Results: Mr Lumbar Spine W Wo Contrast  Result Date: 09/29/2017 CLINICAL DATA:  32 y/o F; back pain with history of polysubstance/IV drug use, concern for infection. EXAM: MRI LUMBAR SPINE WITHOUT AND WITH CONTRAST TECHNIQUE: Multiplanar and multiecho pulse sequences of the lumbar spine were obtained without and with intravenous contrast. CONTRAST:  72mL MULTIHANCE GADOBENATE DIMEGLUMINE 529 MG/ML IV SOLN COMPARISON:  02/07/2017 CT abdomen and pelvis. FINDINGS: Segmentation:  Standard. Alignment:   Physiologic. Vertebrae: No fracture, evidence of discitis, or bone lesion. No abnormal enhancement. Small L1 inferior endplate chronic Schmorl's node. Conus medullaris and cauda equina: Conus extends to the L1 level. Conus and cauda equina appear normal. No abnormal enhancement. Paraspinal and other soft tissues: 4 mm cyst in the lower pole of right kidney. Disc levels: No significant disc displacement, foraminal stenosis, or canal stenosis. IMPRESSION: Negative lumbar spine MRI.  No evidence of discitis osteomyelitis. Electronically Signed   By: [name] M.D.   On: 09/29/2017 22:21     Assessment/Plan: C spine discitis MSSA bacteremia IVDA Hep C 1a   Total days of antibiotics: 9 ancef  She is doing well on anbx and on suboxone 8 mg bid.  She denies cravings.  We spoke about her long term plan. Her mother would like her to go to rehab. I agree. If she is d/c to rehab prior to end of IV anbx ( Nov 03, 2017), she could get doses of long acting anbx (dalbavnacin or oritavancin) to help her complete course.  Hep C rx as outpt.   Will need continued CSW f/u.  rtc in ID in 1 month Available as needed.          Bobby Rumpf MD, FACP Infectious Diseases (pager) [phone] [email or web address] 10/01/2017, 11:11 AM  LOS: 9 days

## 2017-10-01 NOTE — Care Management Note (Signed)
Case Management Note  Patient Details  Name: Sherri Francis MRN: [id number] Date of Birth: [birth year]  Subjective/Objective:  Acute osteomyelitis of cervical spine   Action/Plan: Patient with no PCP, no medical insurance, history of polysubstance/IV drug use, hepatitis C, MSSA endocarditis; needs 8 weeks of IV abx; very difficult case; CM / SW following for progression of care.  Expected Discharge Date:     Undetermined             Expected Discharge Plan:  Substance abuse rehab In-House Referral:  Clinical Social Work  Discharge planning Services  CM Consult  Status of Service:   In progress  Conchita Paris [phone] 10/01/2017, 1:33 PM

## 2017-10-02 ENCOUNTER — Inpatient Hospital Stay (HOSPITAL_COMMUNITY)
Admission: EM | Admit: 2017-10-02 | Discharge: 2017-10-28 | DRG: 540 | Discharge status: Against Medical Advice | Payer: Self-pay | Attending: Internal Medicine | Admitting: Internal Medicine

## 2017-10-02 ENCOUNTER — Encounter (HOSPITAL_COMMUNITY): Payer: Self-pay | Admitting: Emergency Medicine

## 2017-10-02 DIAGNOSIS — F1990 Other psychoactive substance use, unspecified, uncomplicated: Secondary | ICD-10-CM | POA: Diagnosis present

## 2017-10-02 DIAGNOSIS — L03115 Cellulitis of right lower limb: Secondary | ICD-10-CM | POA: Diagnosis present

## 2017-10-02 DIAGNOSIS — F410 Panic disorder [episodic paroxysmal anxiety] without agoraphobia: Secondary | ICD-10-CM | POA: Diagnosis present

## 2017-10-02 DIAGNOSIS — Z813 Family history of other psychoactive substance abuse and dependence: Secondary | ICD-10-CM

## 2017-10-02 DIAGNOSIS — D649 Anemia, unspecified: Secondary | ICD-10-CM

## 2017-10-02 DIAGNOSIS — F17210 Nicotine dependence, cigarettes, uncomplicated: Secondary | ICD-10-CM | POA: Diagnosis present

## 2017-10-02 DIAGNOSIS — E876 Hypokalemia: Secondary | ICD-10-CM | POA: Diagnosis present

## 2017-10-02 DIAGNOSIS — F1123 Opioid dependence with withdrawal: Secondary | ICD-10-CM | POA: Diagnosis present

## 2017-10-02 DIAGNOSIS — Z8249 Family history of ischemic heart disease and other diseases of the circulatory system: Secondary | ICD-10-CM

## 2017-10-02 DIAGNOSIS — B182 Chronic viral hepatitis C: Secondary | ICD-10-CM | POA: Diagnosis present

## 2017-10-02 DIAGNOSIS — B9561 Methicillin susceptible Staphylococcus aureus infection as the cause of diseases classified elsewhere: Secondary | ICD-10-CM | POA: Diagnosis present

## 2017-10-02 DIAGNOSIS — M4622 Osteomyelitis of vertebra, cervical region: Principal | ICD-10-CM | POA: Diagnosis present

## 2017-10-02 DIAGNOSIS — G4700 Insomnia, unspecified: Secondary | ICD-10-CM | POA: Diagnosis not present

## 2017-10-02 DIAGNOSIS — F329 Major depressive disorder, single episode, unspecified: Secondary | ICD-10-CM | POA: Diagnosis present

## 2017-10-02 DIAGNOSIS — R7881 Bacteremia: Secondary | ICD-10-CM | POA: Diagnosis present

## 2017-10-02 DIAGNOSIS — N946 Dysmenorrhea, unspecified: Secondary | ICD-10-CM | POA: Diagnosis not present

## 2017-10-02 DIAGNOSIS — D638 Anemia in other chronic diseases classified elsewhere: Secondary | ICD-10-CM | POA: Diagnosis present

## 2017-10-02 DIAGNOSIS — Z811 Family history of alcohol abuse and dependence: Secondary | ICD-10-CM

## 2017-10-02 DIAGNOSIS — D509 Iron deficiency anemia, unspecified: Secondary | ICD-10-CM | POA: Diagnosis present

## 2017-10-02 DIAGNOSIS — Z8679 Personal history of other diseases of the circulatory system: Secondary | ICD-10-CM

## 2017-10-02 DIAGNOSIS — Z915 Personal history of self-harm: Secondary | ICD-10-CM

## 2017-10-02 HISTORY — DX: Osteomyelitis of vertebra, cervical region: M46.22

## 2017-10-02 LAB — URINALYSIS, ROUTINE W REFLEX MICROSCOPIC
Bilirubin Urine: NEGATIVE
Glucose, UA: NEGATIVE mg/dL
Hgb urine dipstick: NEGATIVE
Ketones, ur: NEGATIVE mg/dL
LEUKOCYTES UA: NEGATIVE
NITRITE: NEGATIVE
PH: 5 (ref 5.0–8.0)
Protein, ur: NEGATIVE mg/dL
SPECIFIC GRAVITY, URINE: 1.025 (ref 1.005–1.030)

## 2017-10-02 LAB — I-STAT BETA HCG BLOOD, ED (MC, WL, AP ONLY)

## 2017-10-02 LAB — CBC WITH DIFFERENTIAL/PLATELET
BASOS ABS: 0 10*3/uL (ref 0.0–0.1)
BASOS PCT: 0 %
Eosinophils Absolute: 0.2 10*3/uL (ref 0.0–0.7)
Eosinophils Relative: 2 %
HCT: 33.3 % — ABNORMAL LOW (ref 36.0–46.0)
HEMOGLOBIN: 10.1 g/dL — AB (ref 12.0–15.0)
Lymphocytes Relative: 42 %
Lymphs Abs: 4.3 10*3/uL — ABNORMAL HIGH (ref 0.7–4.0)
MCH: 24 pg — ABNORMAL LOW (ref 26.0–34.0)
MCHC: 30.3 g/dL (ref 30.0–36.0)
MCV: 79.1 fL (ref 78.0–100.0)
Monocytes Absolute: 0.9 10*3/uL (ref 0.1–1.0)
Monocytes Relative: 9 %
NEUTROS PCT: 47 %
Neutro Abs: 4.9 10*3/uL (ref 1.7–7.7)
Platelets: 496 10*3/uL — ABNORMAL HIGH (ref 150–400)
RBC: 4.21 MIL/uL (ref 3.87–5.11)
RDW: 16.3 % — ABNORMAL HIGH (ref 11.5–15.5)
WBC: 10.4 10*3/uL (ref 4.0–10.5)

## 2017-10-02 LAB — COMPREHENSIVE METABOLIC PANEL
ALT: 17 U/L (ref 14–54)
AST: 23 U/L (ref 15–41)
Albumin: 2.9 g/dL — ABNORMAL LOW (ref 3.5–5.0)
Alkaline Phosphatase: 86 U/L (ref 38–126)
Anion gap: 6 (ref 5–15)
BILIRUBIN TOTAL: 0.5 mg/dL (ref 0.3–1.2)
BUN: 14 mg/dL (ref 6–20)
CHLORIDE: 105 mmol/L (ref 101–111)
CO2: 25 mmol/L (ref 22–32)
Calcium: 8 mg/dL — ABNORMAL LOW (ref 8.9–10.3)
Creatinine, Ser: 0.49 mg/dL (ref 0.44–1.00)
Glucose, Bld: 112 mg/dL — ABNORMAL HIGH (ref 65–99)
POTASSIUM: 3.3 mmol/L — AB (ref 3.5–5.1)
Sodium: 136 mmol/L (ref 135–145)
TOTAL PROTEIN: 6.5 g/dL (ref 6.5–8.1)

## 2017-10-02 LAB — RAPID URINE DRUG SCREEN, HOSP PERFORMED
Amphetamines: NOT DETECTED
BARBITURATES: NOT DETECTED
BENZODIAZEPINES: NOT DETECTED
COCAINE: NOT DETECTED
Opiates: NOT DETECTED
TETRAHYDROCANNABINOL: NOT DETECTED

## 2017-10-02 LAB — C-REACTIVE PROTEIN: CRP: 0.9 mg/dL (ref <1.0)

## 2017-10-02 LAB — SEDIMENTATION RATE: Sed Rate: 18 mm/hr (ref 0–22)

## 2017-10-02 MED ORDER — HYDROXYZINE HCL 25 MG PO TABS
25.0000 mg | ORAL_TABLET | Freq: Three times a day (TID) | ORAL | Status: DC | PRN
  Administered 2017-10-05 – 2017-10-27 (×10): 25 mg via ORAL
  Filled 2017-10-02 (×11): qty 1

## 2017-10-02 MED ORDER — VENLAFAXINE HCL ER 75 MG PO CP24
75.0000 mg | ORAL_CAPSULE | Freq: Every day | ORAL | Status: DC
  Administered 2017-10-03 – 2017-10-27 (×25): 75 mg via ORAL
  Filled 2017-10-02 (×27): qty 1

## 2017-10-02 MED ORDER — LIDOCAINE 5 % EX PTCH
1.0000 | MEDICATED_PATCH | Freq: Every day | CUTANEOUS | Status: DC
  Administered 2017-10-04 – 2017-10-10 (×2): 1 via TRANSDERMAL
  Filled 2017-10-02 (×18): qty 1

## 2017-10-02 MED ORDER — ACETAMINOPHEN 325 MG PO TABS: 650.0000 mg | ORAL_TABLET | Freq: Four times a day (QID) | ORAL | Status: DC | PRN

## 2017-10-02 MED ORDER — SENNOSIDES-DOCUSATE SODIUM 8.6-50 MG PO TABS: 1.0000 | ORAL_TABLET | Freq: Every evening | ORAL | Status: DC | PRN

## 2017-10-02 MED ORDER — CEFAZOLIN SODIUM-DEXTROSE 1-4 GM/50ML-% IV SOLN
1.0000 g | Freq: Once | INTRAVENOUS | Status: AC
  Administered 2017-10-02: 1 g via INTRAVENOUS
  Filled 2017-10-02: qty 50

## 2017-10-02 MED ORDER — ACETAMINOPHEN 650 MG RE SUPP: 650.0000 mg | Freq: Four times a day (QID) | RECTAL | Status: DC | PRN

## 2017-10-02 MED ORDER — ONDANSETRON HCL 4 MG/2ML IJ SOLN
4.0000 mg | Freq: Four times a day (QID) | INTRAMUSCULAR | Status: DC | PRN
  Filled 2017-10-02: qty 2

## 2017-10-02 MED ORDER — POTASSIUM CHLORIDE CRYS ER 20 MEQ PO TBCR
40.0000 meq | EXTENDED_RELEASE_TABLET | Freq: Once | ORAL | Status: AC
  Administered 2017-10-03: 40 meq via ORAL
  Filled 2017-10-02: qty 2

## 2017-10-02 MED ORDER — ENOXAPARIN SODIUM 40 MG/0.4ML ~~LOC~~ SOLN
40.0000 mg | Freq: Every day | SUBCUTANEOUS | Status: DC
  Filled 2017-10-02 (×12): qty 0.4

## 2017-10-02 MED ORDER — KETOROLAC TROMETHAMINE 30 MG/ML IJ SOLN: 30.0000 mg | Freq: Four times a day (QID) | INTRAMUSCULAR | Status: AC | PRN

## 2017-10-02 MED ORDER — CEFAZOLIN SODIUM-DEXTROSE 2-4 GM/100ML-% IV SOLN
2.0000 g | Freq: Three times a day (TID) | INTRAVENOUS | Status: DC
  Administered 2017-10-03 – 2017-10-27 (×74): 2 g via INTRAVENOUS
  Filled 2017-10-02 (×76): qty 100

## 2017-10-02 MED ORDER — GABAPENTIN 400 MG PO CAPS
400.0000 mg | ORAL_CAPSULE | Freq: Three times a day (TID) | ORAL | Status: DC
  Administered 2017-10-03 – 2017-10-27 (×63): 400 mg via ORAL
  Filled 2017-10-02 (×66): qty 1

## 2017-10-02 MED ORDER — AMOXICILLIN-POT CLAVULANATE 875-125 MG PO TABS: 1.0000 | ORAL_TABLET | Freq: Two times a day (BID) | ORAL | 1 refills | Status: AC

## 2017-10-02 MED ORDER — GABAPENTIN 100 MG PO CAPS
200.0000 mg | ORAL_CAPSULE | Freq: Every day | ORAL | Status: DC
  Administered 2017-10-03 – 2017-10-27 (×26): 200 mg via ORAL
  Filled 2017-10-02 (×28): qty 2

## 2017-10-02 MED ORDER — BISACODYL 5 MG PO TBEC
5.0000 mg | DELAYED_RELEASE_TABLET | Freq: Every day | ORAL | Status: DC | PRN
Start: 2017-10-02 — End: 2017-10-28

## 2017-10-02 MED ORDER — ONDANSETRON HCL 4 MG PO TABS: 4.0000 mg | ORAL_TABLET | Freq: Four times a day (QID) | ORAL | Status: DC | PRN

## 2017-10-02 MED ORDER — BUPRENORPHINE HCL-NALOXONE HCL 8-2 MG SL SUBL
1.0000 | SUBLINGUAL_TABLET | Freq: Two times a day (BID) | SUBLINGUAL | Status: DC
  Administered 2017-10-03 – 2017-10-04 (×4): 1 via SUBLINGUAL
  Filled 2017-10-02 (×4): qty 1

## 2017-10-02 NOTE — ED Triage Notes (Addendum)
Pt states she left AMA from [facility] earlier this afternoon. Pt was seen waiting and ambulating in lobby with belongings for several hours and told staff she was discharged and waiting for her ride. Mother arrived and persuaded the pt to check back in. Pt has diagnosis of osteomyelitis to C spine and was on around the clock antibiotics. Pt tearful and states "I panicked, I didn't technically leave."  Per MD [place]wood no orders at this time.

## 2017-10-02 NOTE — ED Provider Notes (Signed)
[facility] EMERGENCY DEPARTMENT Provider Note   CSN: [id number] Arrival date & time: 10/02/17  1702     History   Chief Complaint Chief Complaint  Patient presents with  . Re-admit for IV antibiotics    HPI Sherri Francis is a 32 y.o. female with history of IV drug use, anxiety, depression, suicidal attempts and recently diagnosed osteomyelitis of cervical spine with bacteremia is checking back into the ED after leaving AMA from hospital earlier today. Patient was recommended an 8 week IV antibiotic course for osteomyelitis, states that after 10 days of being in the hospital she had a "breakdown" and decided to leave. States she actually never left the hospital, was pacing back and forth outside and ultimately called her mom who convinced her to check back in to continue antibiotic course. Patient reports neck pain. Had one episode of chills and sweats in the ED. Denies chest pain. Denies IV drug use, last 10-11 days ago.  HPI  Past Medical History:  Diagnosis Date  . Anemia   . Anxiety   . Depression   . Endocarditis   . Hepatitis C   . IVDU (intravenous drug user)   . PID (acute pelvic inflammatory disease)   . Suicide attempt [facility])    multiple attempts  . Teratoma   . UTI (urinary tract infection)     Patient Active Problem List   Diagnosis Date Noted  . Hypokalemia 10/02/2017  . Bacteremia 10/02/2017  . Anxiety and depression   . Osteomyelitis of cervical spine ([place]) 09/22/2017  . IV drug user   . Acute osteomyelitis of cervical spine ([place]) 09/20/2017  . Opioid abuse with opioid-induced mood disorder ([place]) 02/21/2017  . Fever 02/20/2017  . Anemia 02/20/2017  . Acute bacterial endocarditis   . Substance induced mood disorder ([place]) 01/30/2017  . Opioid type dependence, continuous ([place]) 01/30/2017  . 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 Bacteremia due to Staphylococcus  aureus 01/27/2017  . Pneumonia 01/24/2017  . Pelvic abscess in female 01/13/2016  . PID (acute pelvic inflammatory disease) 01/13/2016    Past Surgical History:  Procedure Laterality Date  . LAPAROSCOPY    . TEE WITHOUT CARDIOVERSION N/A 09/27/2017   Procedure: TRANSESOPHAGEAL ECHOCARDIOGRAM (TEE);  Surgeon: Pixie Casino, MD;  Location[facility] ENDOSCOPY;  Service: Cardiovascular;  Laterality: N/A;     OB History    Gravida  0   Para  0   Term  0   Preterm  0   AB  0   Living  0     SAB  0   TAB  0   Ectopic  0   Multiple  0   Live Births  0            Home Medications    Prior to Admission medications   Medication Sig Start Date End Date Taking? Authorizing Provider  amoxicillin-clavulanate (AUGMENTIN) 875-125 MG tablet Take 1 tablet by mouth 2 (two) times daily. Patient not taking: Reported on 10/02/2017 10/02/17 11/01/17  Elodia Florence., MD    Family History Family History  Problem Relation Age of Onset  . Heart disease Maternal Grandfather   . Anxiety disorder Maternal Grandfather   . Heart disease Maternal Aunt   . Alcohol abuse Father   . Drug abuse Father   . Cancer Neg Hx     Social History Social History   Tobacco Use  .  Smoking status: Current Every Day Smoker    Packs/day: 0.50    Types: Cigarettes  . Smokeless tobacco: Never Used  Substance Use Topics  . Alcohol use: Yes    Comment: 6 beers perday- 2 years ago was last time hx of alcoholism per pt.  . Drug use: Yes    Types: IV    Comment: heroin, last 2 days prior to admission     Allergies   Patient has no known allergies.   Review of Systems Review of Systems  Musculoskeletal: Positive for neck pain.  All other systems reviewed and are negative.    Physical Exam Updated Vital Signs BP (!) 103/55   Pulse 88   Temp 97.7 F (36.5 C) (Oral)   Resp 18   Ht 5\' 1"  (1.549 m)   Wt 47.6 kg (105 lb)   LMP 04/03/2017   SpO2 99%   BMI 19.84 kg/m   Physical  Exam  Constitutional: She is oriented to person, place, and time. She appears well-developed and well-nourished. No distress.  Non toxic. In no distress. Mother at bedside.  HENT:  Head: Normocephalic and atraumatic.  Nose: Nose normal.  Mouth/Throat: No oropharyngeal exudate.  Moist mucous membranes. Poor dentition.  Eyes: Pupils are equal, round, and reactive to light. Conjunctivae and EOM are normal.  Neck:  Cervical spine: No midline tenderness. Mild, bilateral paraspinal tenderness. Full passive range of motion with minimal discomfort. No rigidity.  Cardiovascular: Normal rate, regular rhythm and intact distal pulses.  No murmur heard. 2+ DP and radial pulses bilaterally. No LE edema.   Pulmonary/Chest: Effort normal and breath sounds normal. No respiratory distress. She has no wheezes. She has no rales.  Abdominal: Soft. Bowel sounds are normal.  No G/R/R. No suprapubic or CVA tenderness.   Musculoskeletal: Normal range of motion. She exhibits tenderness. She exhibits no deformity.  Thoracic spine: No midline or paraspinal muscular tenderness. Lumbar spine: Mild, midline and paraspinal muscular tenderness.  Neurological: She is alert and oriented to person, place, and time.  Skin: Skin is warm and dry. Capillary refill takes less than 2 seconds.  Psychiatric: She has a normal mood and affect. Her behavior is normal. Judgment and thought content normal.  Nursing note and vitals reviewed.    ED Treatments / Results  Labs (all labs ordered are listed, but only abnormal results are displayed) Labs Reviewed  CBC WITH DIFFERENTIAL/PLATELET - Abnormal; Notable for the following components:      Result Value   Hemoglobin 10.1 (*)    HCT 33.3 (*)    MCH 24.0 (*)    RDW 16.3 (*)    Platelets 496 (*)    Lymphs Abs 4.3 (*)    All other components within normal limits  URINALYSIS, ROUTINE W REFLEX MICROSCOPIC - Abnormal; Notable for the following components:   APPearance CLOUDY (*)     All other components within normal limits  COMPREHENSIVE METABOLIC PANEL - Abnormal; Notable for the following components:   Potassium 3.3 (*)    Glucose, Bld 112 (*)    Calcium 8.0 (*)    Albumin 2.9 (*)    All other components within normal limits  URINE CULTURE  RAPID URINE DRUG SCREEN, HOSP PERFORMED  C-REACTIVE PROTEIN  SEDIMENTATION RATE  MAGNESIUM  BASIC METABOLIC PANEL  CBC WITH DIFFERENTIAL/PLATELET  I-STAT BETA HCG BLOOD, ED ([facility], [facility], [facility] ONLY)    EKG None  Radiology No results found.  Procedures Procedures (including critical care time)  Medications Ordered  in ED Medications  buprenorphine-naloxone (SUBOXONE) 8-2 mg per SL tablet 1 tablet (has no administration in time range)  gabapentin (NEURONTIN) capsule 400 mg (has no administration in time range)  gabapentin (NEURONTIN) capsule 200 mg (has no administration in time range)  lidocaine (LIDODERM) 5 % 1 patch (has no administration in time range)  venlafaxine XR (EFFEXOR-XR) 24 hr capsule 75 mg (has no administration in time range)  potassium chloride SA (K-DUR,KLOR-CON) CR tablet 40 mEq (has no administration in time range)  enoxaparin (LOVENOX) injection 40 mg (has no administration in time range)  acetaminophen (TYLENOL) tablet 650 mg (has no administration in time range)    Or  acetaminophen (TYLENOL) suppository 650 mg (has no administration in time range)  ketorolac (TORADOL) 30 MG/ML injection 30 mg (has no administration in time range)  ondansetron (ZOFRAN) tablet 4 mg (has no administration in time range)    Or  ondansetron (ZOFRAN) injection 4 mg (has no administration in time range)  senna-docusate (Senokot-S) tablet 1 tablet (has no administration in time range)  bisacodyl (DULCOLAX) EC tablet 5 mg (has no administration in time range)  hydrOXYzine (ATARAX/VISTARIL) tablet 25 mg (has no administration in time range)  ceFAZolin (ANCEF) IVPB 1 g/50 mL premix (1 g Intravenous New Bag/Given 10/02/17  2154)     Initial Impression / Assessment and Plan / ED Course  I have reviewed the triage vital signs and the nursing notes.  Pertinent labs & imaging results that were available during my care of the patient were reviewed by me and considered in my medical decision making (see chart for details).      Lab work remarkable for hemoglobin 10.1, potassium 3.3, albumin 2.9. Urinalysis negative. Urine drug screen negative. Urine culture sent. Ancef ordered in the ED. Spoke to Dr. Myna Hidalgo who will admit.  Final Clinical Impressions(s) / ED Diagnoses   Final diagnoses:  Osteomyelitis of cervical spine [facility])    ED Discharge Orders    None       Arlean Hopping 10/02/17 2303    Fredia Sorrow, MD 10/03/17 2049

## 2017-10-02 NOTE — Progress Notes (Signed)
Patient signed AMA paper. Prescription given for ABX. Instructions given per DR. Florene Glen

## 2017-10-02 NOTE — Discharge Summary (Signed)
Physician Discharge Summary  Sherri Francis TLX:[id number] DOB: 03-Apr-1986 DOA: 09/22/2017  PCP: Patient, No Pcp Per  Admit date: 09/22/2017 Discharge date: 10/02/2017  Time spent: over 30 minutes  Recommendations for Outpatient Follow-up:  1. Follow up outpatient CBC/CMP 2. Follow up with PCP and infectious disease as outpatient 3. Follow up with psychiatry as an outpatient    Patient left against medical advice.  See below.   Discharge Diagnoses:  Principal Problem:   Acute osteomyelitis of cervical spine ([place]) Active Problems:   Bacteremia due to Staphylococcus aureus   Opioid type dependence, continuous ([place])   Osteomyelitis of cervical spine ([facility])   Anxiety and depression  Filed Weights   09/23/17 0430 09/24/17 2012 09/27/17 0719  Weight: 46.9 kg (103 lb 6.3 oz) 49.9 kg (110 lb) 49.9 kg (110 lb)    History of present illness:  Per HPI Sherri Francis is a 32 y.o. 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  She was admitted for acute osteomyelitis of the cervical spine from 4/15-16, when she left AMA to resume use of heroin.  During her hospitalization, she was started on Vanc and Zosyn and had 2/2 cultures positive for MSSA and so was changed to nafcillin with ID following.  She was scheduled for TEE but left before this could be performed.  She returned with intractable neck pain.  Last use of heroin was day prior to admission, quantity uncertain.  She injects in her hands and arms.  She was somnolent on admission from 1 mg Ativan given in the ER and so reluctant to provide additional history.  She was initially IVC'd by psychiatry for suicidal ideation.   She was eventually cleared by psychiatry.  She had a TEE during her hospitalization that showed no evidence of endocarditis.  Small PFO.  She had MRI of her  cervical and thoracic spine that was notable for abnormal prevertebral edema from C1-2 through C5 concerning for possible infection.  Infectious disease was following recommending ancef x 8 weeks.  She was on day 10 of antibiotics on the day of discharge.  I was called multiple times by nursing with patient stating she had to leave today.  She said that she was going to the [facility] for rehab.  I called the [facility] did not know of any new admissions.  I asked if I could call her mother, but the pt declined this.  Her mother's number is not in the chart.  I recommended the patient stay for continued IV antibiotic therapy as this is what ID is recommending at this point.  She denies suicidal ideation.  She knows the risks of leaving against medical advice (worsening infection, complications, including death).  Strongly recommended against leaving AMA, but patient asked to leave AMA.  Discussed with infectious disease on call regarding possible antibiotic options for her and decided on augmentin.       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 Previously IVCd, but most recently psychiatrically cleared.  Repeat blood culture from 4/16 (single), no growth. Repeat blood cx from 4/23 NGTD x 4 Planning for midline 4/25 (removed today) MRI cervical spine with abnormal prevertebral edema.  Suggests possible infection but no definite osteomyelitis.  Consider neck CT. Transesophageal Echocardiogram performed and significant for resolution of prior  TV vegetation. No new vegetation. PFO noted. Continue cefazolin Infectious disease recommendations: Cefazolinx8 weeks Pt with TTP of lumbar spine, will follow up MRI lumbar spine (negative).   She's noted to have some soreness of her R neck as well for the past couple of days, will hold off on reimaging for now, consider CT if persistent (seems to be improving).    Suicidal ideation Psychiatryinitiallyrecommendedinpatient psych. On reevaluation, cleared from inpatient psych recommendations.Started on Effexor. Recommended having inpatient psychology see her, but this doesn't sound like something that's able to be done.    Opiate dependence Polysubstance abuse Patient desires abstinence from IV drug use.No withdrawal symptoms -Clonidine taper -symptom control with atarax, robaxin, naproxen - continue suboxone -Continue hydroxyzine  Hep C: ID planning to treat as outpatient.  Musculoskeletal Pain:  TTP of lumbar spine, imaging as above.  She's got some soreness of her R neck and slightly limited ROM.  For now, will treat this as muscle spasm, but ctm closely and reimage prn.  Improving.   PFO Seen on Transesophageal Echocardiogram. Small.  Insomnia  RLS: pt notes she takes gabapentin for this.  Will increase nighttime dose to 600 mg.  Stable.  Thrombocytosis: likely reactive, follow (?ancef per ID).  CTM.   Procedures: TEE 4/22  IMPRESSION:  1. No evidence for endocarditis. The previously noted TV vegetation has resolved. 2. No LAA thrombus 3. Very small PFO noted with saline microbubble contrast 4. LVEF 55-60%  Consultations:  Infectious disease  Psychiatry     Discharge Exam: Vitals:   10/01/17 2123 10/02/17 0529  BP: (!) 112/59 (!) 101/59  Pulse: 74 75  Resp: 17 17  Temp: 98.1 F (36.7 C) 98.4 F (36.9 C)  SpO2: 99% 98%   Asking to leave against medical advice.  General: No acute distress. Cardiovascular: Heart sounds show a regular rate, and rhythm. No gallops or rubs. No murmurs. No JVD. Lungs: Clear to auscultation bilaterally with good air movement. No rales, rhonchi or wheezes. Abdomen: Soft, nontender, nondistended with normal active bowel sounds. No masses. No hepatosplenomegaly. Neurological: Alert and oriented 3. Moves all extremities 4 . Cranial nerves II through XII grossly  intact. Skin: Warm and dry. No rashes or lesions. Extremities: No clubbing or cyanosis. No edema  Discharge Instructions   Discharge Instructions    Call MD for:  persistant dizziness or light-headedness   Complete by:  As directed    Call MD for:  persistant nausea and vomiting   Complete by:  As directed    Call MD for:  redness, tenderness, or signs of infection (pain, swelling, redness, odor or green/yellow discharge around incision site)   Complete by:  As directed    Call MD for:  severe uncontrolled pain   Complete by:  As directed    Call MD for:  temperature >100.4   Complete by:  As directed    Diet - low sodium heart healthy   Complete by:  As directed    Discharge instructions   Complete by:  As directed    You left against medical advice.  You were treated for bacteria in your blood, heart, and spine.  I will send you with augmentin to take twice daily.  Please follow up with a PCP within the next week.  Please follow up with infectious disease as an outpatient as soon as possible.  You should follow up your labs in a few days.  Return if you have any new or worsening symptoms.  Please  ask your PCP to request records from this hospitalization so they know what was done and what the next steps will be.   Increase activity slowly   Complete by:  As directed      Allergies as of 10/02/2017   No Known Allergies     Medication List    TAKE these medications   amoxicillin-clavulanate 875-125 MG tablet Commonly known as:  AUGMENTIN Take 1 tablet by mouth 2 (two) times daily.      No Known Allergies    The results of significant diagnostics from this hospitalization (including imaging, microbiology, ancillary and laboratory) are listed below for reference.    Significant Diagnostic Studies: Dg Chest 2 View  Result Date: 09/20/2017 CLINICAL DATA:  Fever and neck stiffness for 2 days. EXAM: CHEST - 2 VIEW COMPARISON:  PA and lateral chest 02/24/2017 and  01/24/2017. CT chest 01/27/2017. FINDINGS: The lungs are clear. Cavitary lesions in the right chest seen on prior examinations are not visualized today. No pneumothorax or pleural fluid. Heart size is normal. No bony abnormality. IMPRESSION: Negative chest. Electronically Signed   By: Inge Rise M.D.   On: 09/20/2017 09:23   Mr Cervical Spine Wo Contrast  Result Date: 09/20/2017 CLINICAL DATA:  Initial evaluation for possible epidural abscess. History of IVDU, endocarditis. EXAM: MRI CERVICAL SPINE WITHOUT CONTRAST TECHNIQUE: Multiplanar, multisequence MR imaging of the cervical spine was performed. No intravenous contrast was administered. COMPARISON:  None available. FINDINGS: Alignment: Examination limited as the patient was unable to tolerate the full length of the exam. No post-contrast sequences were performed. Additionally, imaging of the thorax was ordered but not performed due to patient's inability to continue. Vertebral bodies normally aligned with preservation of the normal cervical lordosis. No listhesis. Vertebrae: Vertebral body heights maintained without evidence for acute or chronic fracture. Bone marrow signal intensity diffusely decreased on T1 weighted imaging, most commonly related to anemia, smoking, obesity, or other chronic disease. No discrete or worrisome osseous lesions. No abnormal marrow edema to suggest acute osteomyelitis. Cord: Signal intensity within the cervical spinal cord is normal. No discernible epidural collections identified on this noncontrast examination. Posterior Fossa, vertebral arteries, paraspinal tissues: Visualized brain and posterior fossa within normal limits. Scattered mucosal thickening within the left maxillary sinus. There is abnormal prevertebral edema/effusion normal intravascular flow voids present within the vertebral arteries bilaterally. Adjacent longus coli musculature relatively normal in appearance. No other significant soft tissue edema or  inflammatory changes within the visualized neck. Mildly prominent bilateral level 2 lymph nodes measure to the upper limits of normal at approximately 1 cm in short axis. Extending from C1 through C5, nonspecific, but could reflect sequelae of acute infection Disc levels: No significant disc pathology seen within the cervical spine. No findings to suggest acute discitis. No disc bulge or disc protrusion. No significant canal or foraminal stenosis. IMPRESSION: 1. Limited study due to patient's inability to tolerate the full length of the exam. Noncontrast imaging of the cervical spinal E was performed. 2. Abnormal prevertebral edema extending from C1-2 inferiorly through C5. While this finding is nonspecific, this is most suspicious for possible infection given provided history. Source not entirely clear, as there are no other imaging findings to suggest osteomyelitis discitis on this exam. No discernible epidural abscess or other intraspinal infection. Further evaluation with dedicated neck CT may be helpful for further evaluation as clinically warranted. Electronically Signed   By: Jeannine Boga M.D.   On: 09/20/2017 20:06   Mr Thoracic Spine W  Contrast  Result Date: 09/20/2017 CLINICAL DATA:  Initial evaluation for possible epidural abscess. History of IVDU, endocarditis. EXAM: MRI CERVICAL SPINE WITHOUT CONTRAST TECHNIQUE: Multiplanar, multisequence MR imaging of the cervical spine was performed. No intravenous contrast was administered. COMPARISON:  None available. FINDINGS: Alignment: Examination limited as the patient was unable to tolerate the full length of the exam. No post-contrast sequences were performed. Additionally, imaging of the thorax was ordered but not performed due to patient's inability to continue. Vertebral bodies normally aligned with preservation of the normal cervical lordosis. No listhesis. Vertebrae: Vertebral body heights maintained without evidence for acute or chronic  fracture. Bone marrow signal intensity diffusely decreased on T1 weighted imaging, most commonly related to anemia, smoking, obesity, or other chronic disease. No discrete or worrisome osseous lesions. No abnormal marrow edema to suggest acute osteomyelitis. Cord: Signal intensity within the cervical spinal cord is normal. No discernible epidural collections identified on this noncontrast examination. Posterior Fossa, vertebral arteries, paraspinal tissues: Visualized brain and posterior fossa within normal limits. Scattered mucosal thickening within the left maxillary sinus. There is abnormal prevertebral edema/effusion normal intravascular flow voids present within the vertebral arteries bilaterally. Adjacent longus coli musculature relatively normal in appearance. No other significant soft tissue edema or inflammatory changes within the visualized neck. Mildly prominent bilateral level 2 lymph nodes measure to the upper limits of normal at approximately 1 cm in short axis. Extending from C1 through C5, nonspecific, but could reflect sequelae of acute infection Disc levels: No significant disc pathology seen within the cervical spine. No findings to suggest acute discitis. No disc bulge or disc protrusion. No significant canal or foraminal stenosis. IMPRESSION: 1. Limited study due to patient's inability to tolerate the full length of the exam. Noncontrast imaging of the cervical spinal E was performed. 2. Abnormal prevertebral edema extending from C1-2 inferiorly through C5. While this finding is nonspecific, this is most suspicious for possible infection given provided history. Source not entirely clear, as there are no other imaging findings to suggest osteomyelitis discitis on this exam. No discernible epidural abscess or other intraspinal infection. Further evaluation with dedicated neck CT may be helpful for further evaluation as clinically warranted. Electronically Signed   By: Jeannine Boga M.D.    On: 09/20/2017 20:06   Mr Lumbar Spine W Wo Contrast  Result Date: 09/29/2017 CLINICAL DATA:  32 y/o F; back pain with history of polysubstance/IV drug use, concern for infection. EXAM: MRI LUMBAR SPINE WITHOUT AND WITH CONTRAST TECHNIQUE: Multiplanar and multiecho pulse sequences of the lumbar spine were obtained without and with intravenous contrast. CONTRAST:  33mL MULTIHANCE GADOBENATE DIMEGLUMINE 529 MG/ML IV SOLN COMPARISON:  02/07/2017 CT abdomen and pelvis. FINDINGS: Segmentation:  Standard. Alignment:  Physiologic. Vertebrae: No fracture, evidence of discitis, or bone lesion. No abnormal enhancement. Small L1 inferior endplate chronic Schmorl's node. Conus medullaris and cauda equina: Conus extends to the L1 level. Conus and cauda equina appear normal. No abnormal enhancement. Paraspinal and other soft tissues: 4 mm cyst in the lower pole of right kidney. Disc levels: No significant disc displacement, foraminal stenosis, or canal stenosis. IMPRESSION: Negative lumbar spine MRI.  No evidence of discitis osteomyelitis. Electronically Signed   By: [name] M.D.   On: 09/29/2017 22:21    Microbiology: Recent Results (from the past 240 hour(s))  MRSA PCR Screening     Status: None   Collection Time: 09/23/17  4:28 AM  Result Value Ref Range Status   MRSA by PCR NEGATIVE NEGATIVE Final  Comment:        The GeneXpert MRSA Assay (FDA approved for NASAL specimens only), is one component of a comprehensive MRSA colonization surveillance program. It is not intended to diagnose MRSA infection nor to guide or monitor treatment for MRSA infections. Performed at [facility], [place] [address] [place], [place] [address]   Culture, blood (routine x 2)     Status: None (Preliminary result)   Collection Time: 09/28/17 11:30 AM  Result Value Ref Range Status   Specimen Description BLOOD RIGHT HAND  Final   Special Requests   Final    BOTTLES DRAWN AEROBIC ONLY Blood Culture  adequate volume   Culture   Final    NO GROWTH 4 DAYS Performed at [facility], [place] [address], [place], [place] [address]    Report Status PENDING  Incomplete  Culture, blood (routine x 2)     Status: None (Preliminary result)   Collection Time: 09/28/17 11:45 AM  Result Value Ref Range Status   Specimen Description BLOOD LEFT HAND  Final   Special Requests   Final    BOTTLES DRAWN AEROBIC ONLY Blood Culture adequate volume   Culture   Final    NO GROWTH 4 DAYS Performed at [facility], [place] [address], [place],  [address]    Report Status PENDING  Incomplete     Labs: Basic Metabolic Panel: Recent Labs  Lab 09/27/17 0623 09/30/17 0945  NA 136 136  K 4.3 4.0  CL 105 103  CO2 23 23  GLUCOSE 85 121*  BUN 12 11  CREATININE 0.51 0.50  CALCIUM 9.1 8.9   Liver Function Tests: Recent Labs  Lab 09/30/17 0945  AST 22  ALT 18  ALKPHOS 97  BILITOT 0.3  PROT 7.7  ALBUMIN 3.4*   No results for input(s): LIPASE, AMYLASE in the last 168 hours. No results for input(s): AMMONIA in the last 168 hours. CBC: Recent Labs  Lab 09/27/17 0623 09/30/17 0708  WBC 9.4 7.9  HGB 11.0* 10.8*  HCT 36.6 35.2*  MCV 78.2 78.7  PLT 677* 533*   Cardiac Enzymes: No results for input(s): CKTOTAL, CKMB, CKMBINDEX, TROPONINI in the last 168 hours. BNP: BNP (last 3 results) No results for input(s): BNP in the last 8760 hours.  ProBNP (last 3 results) No results for input(s): PROBNP in the last 8760 hours.  CBG: No results for input(s): GLUCAP in the last 168 hours.     Signed:  Fayrene Helper MD.  [facility] 10/02/2017, 6:45 PM

## 2017-10-02 NOTE — H&P (Signed)
History and Physical    Sherri Francis KTG:[id number] DOB: 05/26/1986 DOA: 10/02/2017  PCP: Patient, No Pcp Per   Patient coming from: Home  Chief Complaint: Left AMA, returning for resumption of care  HPI: Sherri Francis is a 32 y.o. female with medical history significant for IV drug abuse, opiate dependence, hepatitis C, history of endocarditis, MSSA bacteremia, and osteomyelitis involving the cervical spine, now presenting to the emergency department after leaving AMA earlier in the day.  Patient was admitted to the hospital on 09/22/2017 with acute osteomyelitis involving the cervical spine, found to have MSSA bacteremia.  ID consultation was obtained and Ancef 2 g IV every 8 hours for 8 weeks was recommended.  She had a repeat TEE that demonstrated resolution of prior TV vegetation, small PFO, and no evidence for recurrent endocarditis.  She had reported suicidal ideation during the hospitalization, was eventually placed under IVC, but on follow-up with psychiatry, she was cleared from their perspective.  She left the hospital this afternoon [place], waited for several hours for someone to pick her up, and when no indeed, she decided to check herself back in through the ED for resumption of treatment.  ED Course: Upon arrival to the ED, patient is found to be afebrile, saturating well on room air, slightly tachycardic, and with blood pressure 103/55.  Chemistry panel is notable for a potassium of 3.3 and CBC features a stable anemia with hemoglobin of 10.1 and a stable thrombocytosis with platelets 496,000.  UDS is negative.  Patient was given 1 g of Ancef.  She remains hemodynamically stable and will be readmitted to the medical-surgical unit for ongoing management of MSSA bacteremia and cervical spine osteomyelitis.  Review of Systems:  All other systems reviewed and apart from HPI, are negative.  Past Medical History:  Diagnosis Date  . Anemia   . Anxiety   . Depression   .  Endocarditis   . Hepatitis C   . IVDU (intravenous drug user)   . PID (acute pelvic inflammatory disease)   . Suicide attempt [facility])    multiple attempts  . Teratoma   . UTI (urinary tract infection)     Past Surgical History:  Procedure Laterality Date  . LAPAROSCOPY    . TEE WITHOUT CARDIOVERSION N/A 09/27/2017   Procedure: TRANSESOPHAGEAL ECHOCARDIOGRAM (TEE);  Surgeon: Pixie Casino, MD;  Location: [facility] ENDOSCOPY;  Service: Cardiovascular;  Laterality: N/A;     reports that she has been smoking cigarettes.  She has been smoking about 0.50 packs per day. She has never used smokeless tobacco. She reports that she drinks alcohol. She reports that she has current or past drug history. Drug: IV.  No Known Allergies  Family History  Problem Relation Age of Onset  . Heart disease Maternal Grandfather   . Anxiety disorder Maternal Grandfather   . Heart disease Maternal Aunt   . Alcohol abuse Father   . Drug abuse Father   . Cancer Neg Hx      Prior to Admission medications   Medication Sig Start Date End Date Taking? Authorizing Provider  amoxicillin-clavulanate (AUGMENTIN) 875-125 MG tablet Take 1 tablet by mouth 2 (two) times daily. Patient not taking: Reported on 10/02/2017 10/02/17 11/01/17  Elodia Florence., MD    Physical Exam: Vitals:   10/02/17 1737 10/02/17 1948 10/02/17 2035 10/02/17 2100  BP:  130/83 117/85 (!) 103/55  Pulse: (!) 101 (!) 104 86 88  Resp: 17 20 18    Temp: 98.2  F (36.8 C) 98.4 F (36.9 C) 97.7 F (36.5 C)   TempSrc: Oral Oral Oral   SpO2: 100% 100% 99% 99%  Weight: 47.6 kg (105 lb)     Height: 5\' 1"  (1.549 m)         Constitutional: NAD, calm  Eyes: PERTLA, lids and conjunctivae normal ENMT: Mucous membranes are moist. Posterior pharynx clear of any exudate or lesions.   Neck: normal, supple, no masses, no thyromegaly Respiratory: clear to auscultation bilaterally, no wheezing, no crackles. Normal respiratory effort.      Cardiovascular: S1 & S2 heard, regular rate and . No extremity edema. 2+ pedal pulses. No significant JVD. Abdomen: No distension, no tenderness, soft. Bowel sounds normal.  Musculoskeletal: no clubbing / cyanosis. Mild bony tenderness cervical spine.    Skin: no significant rashes, lesions, ulcers. Warm, dry, well-perfused. Neurologic: CN 2-12 grossly intact. Sensation intact. Strength 5/5 in all 4 limbs.  Psychiatric: Alert and oriented x 3. Calm, cooperative.     Labs on Admission: I have personally reviewed following labs and imaging studies  CBC: Recent Labs  Lab 09/27/17 0623 09/30/17 0708 10/02/17 2117  WBC 9.4 7.9 10.4  NEUTROABS  --   --  4.9  HGB 11.0* 10.8* 10.1*  HCT 36.6 35.2* 33.3*  MCV 78.2 78.7 79.1  PLT 677* 533* 981*   Basic Metabolic Panel: Recent Labs  Lab 09/27/17 0623 09/30/17 0945 10/02/17 2117  NA 136 136 136  K 4.3 4.0 3.3*  CL 105 103 105  CO2 23 23 25   GLUCOSE 85 121* 112*  BUN 12 11 14   CREATININE 0.51 0.50 0.49  CALCIUM 9.1 8.9 8.0*   GFR: Estimated Creatinine Clearance: 77.3 mL/min (by C-G formula based on SCr of 0.49 mg/dL). Liver Function Tests: Recent Labs  Lab 09/30/17 0945 10/02/17 2117  AST 22 23  ALT 18 17  ALKPHOS 97 86  BILITOT 0.3 0.5  PROT 7.7 6.5  ALBUMIN 3.4* 2.9*   No results for input(s): LIPASE, AMYLASE in the last 168 hours. No results for input(s): AMMONIA in the last 168 hours. Coagulation Profile: No results for input(s): INR, PROTIME in the last 168 hours. Cardiac Enzymes: No results for input(s): CKTOTAL, CKMB, CKMBINDEX, TROPONINI in the last 168 hours. BNP (last 3 results) No results for input(s): PROBNP in the last 8760 hours. HbA1C: No results for input(s): HGBA1C in the last 72 hours. CBG: No results for input(s): GLUCAP in the last 168 hours. Lipid Profile: No results for input(s): CHOL, HDL, LDLCALC, TRIG, CHOLHDL, LDLDIRECT in the last 72 hours. Thyroid Function Tests: No results for  input(s): TSH, T4TOTAL, FREET4, T3FREE, THYROIDAB in the last 72 hours. Anemia Panel: No results for input(s): VITAMINB12, FOLATE, FERRITIN, TIBC, IRON, RETICCTPCT in the last 72 hours. Urine analysis:    Component Value Date/Time   COLORURINE YELLOW 10/02/2017 2117   APPEARANCEUR CLOUDY (A) 10/02/2017 2117   LABSPEC 1.025 10/02/2017 2117   PHURINE 5.0 10/02/2017 2117   GLUCOSEU NEGATIVE 10/02/2017 2117   HGBUR NEGATIVE 10/02/2017 2117   BILIRUBINUR NEGATIVE 10/02/2017 2117   KETONESUR NEGATIVE 10/02/2017 2117   PROTEINUR NEGATIVE 10/02/2017 2117   UROBILINOGEN 0.2 10/15/2012 1341   NITRITE NEGATIVE 10/02/2017 2117   LEUKOCYTESUR NEGATIVE 10/02/2017 2117   Sepsis Labs: @LABRCNTIP (procalcitonin:4,lacticidven:4) ) Recent Results (from the past 240 hour(s))  MRSA PCR Screening     Status: None   Collection Time: 09/23/17  4:28 AM  Result Value Ref Range Status   MRSA by PCR NEGATIVE  NEGATIVE Final    Comment:        The GeneXpert MRSA Assay (FDA approved for NASAL specimens only), is one component of a comprehensive MRSA colonization surveillance program. It is not intended to diagnose MRSA infection nor to guide or monitor treatment for MRSA infections. Performed at [facility], [place] [address], [place], [place] [address]   Culture, blood (routine x 2)     Status: None (Preliminary result)   Collection Time: 09/28/17 11:30 AM  Result Value Ref Range Status   Specimen Description BLOOD RIGHT HAND  Final   Special Requests   Final    BOTTLES DRAWN AEROBIC ONLY Blood Culture adequate volume   Culture   Final    NO GROWTH 4 DAYS Performed at [facility], [place] [address], [place], [place] [address]    Report Status PENDING  Incomplete  Culture, blood (routine x 2)     Status: None (Preliminary result)   Collection Time: 09/28/17 11:45 AM  Result Value Ref Range Status   Specimen Description BLOOD LEFT HAND  Final   Special Requests   Final    BOTTLES DRAWN  AEROBIC ONLY Blood Culture adequate volume   Culture   Final    NO GROWTH 4 DAYS Performed at [facility], [place] [address], [place],  [address]    Report Status PENDING  Incomplete     Radiological Exams on Admission: No results found.  EKG: Not performed.   Assessment/Plan   1. MSSA bacteremia; osteomyelitis cervical spine  - Blood cultures from 4/15 grew MSSA, repeat cultures from 4/23 NGTD  - TEE during recent admission negative for endocarditis, notable for resolution of TV vegetation and small PFO   - MRI concerning for osteomyelitis cervical spine  - ID consulted during recent admission and 8 wks of Ancef recommended; this is day 10  - Resume Ancef 2 g IV q8h    2. Anemia  - Hgb is 10.1 on admission - Appears to be stable, likely secondary to chronic disease    3. Hypokalemia  - Serum potassium is 3.3 on admission  - Treated with 40 mEq oral potassium  - Repeat chem panel in am    4. IV drug abuse; opiate dependence  - Patient has expressed desire/readiness for abstinence  - UDS negative on admission  - Continue Suboxone, gabapentin, prn Atarax    5. Hep C  - ID planning to treat outpatient   6. Thrombocytosis  - Platelets 496k on admission, slightly lower than recent priors  - Likely reactive, monitor     DVT prophylaxis: Lovenox Code Status: Full  Family Communication: Mother updated at bedside with patient's permission  Consults called: None Admission status: Inpatient    Vianne Bulls, MD [facility] Pager [phone]  If 7PM-7AM, please contact night-coverage [email or web address] Password [facility]  10/02/2017, 10:57 PM

## 2017-10-02 NOTE — Progress Notes (Signed)
Pharmacy Antibiotic Note  Sherri Francis is a 32 y.o. female admitted on 10/02/2017 with bacteremia and osteomyelitis.  Pharmacy has been consulted for Cefazolin dosing. Pt with known MSSA bacteremia/osteomyelitis. Pt just left AMA this past AM. She has left AMA several times in the past for the same. WBC WNL. Renal function good.   Plan: Resume Cefazolin 2g IV q8h Trend WBC, temp, renal function  F/U repeat cultures   Height: 5\' 1"  (154.9 cm) Weight: 105 lb (47.6 kg) IBW/kg (Calculated) : 47.8  Temp (24hrs), Avg:98.2 F (36.8 C), Min:97.7 F (36.5 C), Max:98.4 F (36.9 C)  Recent Labs  Lab 09/27/17 0623 09/30/17 0708 09/30/17 0945 10/02/17 2117  WBC 9.4 7.9  --  10.4  CREATININE 0.51  --  0.50 0.49    Estimated Creatinine Clearance: 77.3 mL/min (by C-G formula based on SCr of 0.49 mg/dL).    No Known Allergies   [name] 10/02/2017 11:13 PM

## 2017-10-03 LAB — BASIC METABOLIC PANEL
ANION GAP: 7 (ref 5–15)
BUN: 11 mg/dL (ref 6–20)
CO2: 26 mmol/L (ref 22–32)
Calcium: 8.7 mg/dL — ABNORMAL LOW (ref 8.9–10.3)
Chloride: 104 mmol/L (ref 101–111)
Creatinine, Ser: 0.49 mg/dL (ref 0.44–1.00)
GFR calc Af Amer: >60 mL/min (ref >60)
GFR calc non Af Amer: >60 mL/min (ref >60)
Glucose, Bld: 74 mg/dL (ref 65–99)
POTASSIUM: 4.3 mmol/L (ref 3.5–5.1)
Sodium: 137 mmol/L (ref 135–145)

## 2017-10-03 LAB — CULTURE, BLOOD (ROUTINE X 2)
CULTURE: NO GROWTH
Culture: NO GROWTH
SPECIAL REQUESTS: ADEQUATE
Special Requests: ADEQUATE

## 2017-10-03 LAB — CBC WITH DIFFERENTIAL/PLATELET
Basophils Absolute: 0 10*3/uL (ref 0.0–0.1)
Basophils Relative: 0 %
Eosinophils Absolute: 0.2 10*3/uL (ref 0.0–0.7)
Eosinophils Relative: 2 %
HEMATOCRIT: 35.8 % — AB (ref 36.0–46.0)
Hemoglobin: 10.8 g/dL — ABNORMAL LOW (ref 12.0–15.0)
LYMPHS ABS: 3.2 10*3/uL (ref 0.7–4.0)
LYMPHS PCT: 33 %
MCH: 24.2 pg — AB (ref 26.0–34.0)
MCHC: 30.2 g/dL (ref 30.0–36.0)
MCV: 80.3 fL (ref 78.0–100.0)
MONOS PCT: 9 %
Monocytes Absolute: 0.9 10*3/uL (ref 0.1–1.0)
NEUTROS ABS: 5.4 10*3/uL (ref 1.7–7.7)
Neutrophils Relative %: 56 %
Platelets: 513 10*3/uL — ABNORMAL HIGH (ref 150–400)
RBC: 4.46 MIL/uL (ref 3.87–5.11)
RDW: 17.1 % — AB (ref 11.5–15.5)
WBC: 9.6 10*3/uL (ref 4.0–10.5)

## 2017-10-03 LAB — MAGNESIUM: Magnesium: 1.9 mg/dL (ref 1.7–2.4)

## 2017-10-03 MED ORDER — DICLOFENAC SODIUM 1 % TD GEL
2.0000 g | Freq: Four times a day (QID) | TRANSDERMAL | Status: DC
  Administered 2017-10-04 (×3): 2 g via TOPICAL
  Filled 2017-10-03: qty 100

## 2017-10-03 NOTE — ED Notes (Signed)
Dr. Danford at bedside  

## 2017-10-03 NOTE — ED Notes (Signed)
pts family member given a warm blanket and pillow

## 2017-10-03 NOTE — Progress Notes (Signed)
PROGRESS NOTE    Sherri Francis  WUJ:[id number] DOB: 1985-09-21 DOA: 10/02/2017 PCP: Patient, No Pcp Per      Brief Narrative:  Mrs. Sherri Francis is a 32 y.o. 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  She left AMA 4/16, then readmitted 4/17 for resumption of treatment, had TEE 4/22 that showed resolution of her tricuspid vegetation, resumed cefazolin.  Started suboxone.  ID consulted, recommended 8 weeks therapy.  Patient left AMA again 4/27, returned within 4 hours to resume treatment.     Assessment & Plan:  MSSA bacteremia, previous endocarditis incompletely treated Cervical spine discitis Cultures negative since 4/16 -Continue cefazolin start date 4/16 -Consult ID, appreciate cares  Right knee pain The waxing and waning swelling seem less likely to be from septic joint, although she reports persistent pain in this knee.  Will start with MRI.  If negative, will monitor and tap if swollen. -MRI R knee with contrast ordered  Opioid use disorder -Continue Suboxone -Continue gabapentin, as needed hydroxyzine -Continue Voltaren gel as needed  Hepatitis C Plan for outpatient treatment  Thrombocytosis -Repeat at least weekly while on cefazolin  Hypokalemia Resolved  Anemia of chronic disease Hemoglobin stable at baseline  Depression -Continue venlafaxine    DVT prophylaxis: Lovenox Code Status: FULL Family Communication: Mother at bedside MDM and disposition Plan: The below labs and imaging reports were reviewed.  The patient's status is clinically stable.  She is admitted for staph bacteremia with discitis in the context of IV drug abuse, ongoing, and frequently leaving AMA.  She will be resumed on cefazolin and suboxone with plans to complete 8 weeks then  transition to outpatietn rehab for opioid use disorder.     Consultants:   ID  Psychiatry  Procedures:   TEE 4/22 Study Conclusions  - Left ventricle: There was mild concentric hypertrophy. Systolic   function was normal. The estimated ejection fraction was in the   range of 55% to 60%. Wall motion was normal; there were no   regional wall motion abnormalities. - Aortic valve: No evidence of vegetation. - Mitral valve: No evidence of vegetation. - Left atrium: No evidence of thrombus in the atrial cavity or   appendage. No evidence of thrombus in the appendage. - Right atrium: No evidence of thrombus in the atrial cavity or   appendage. - Atrial septum: Hypermobile septum - there is a small PFO by   saline microbubble contrast. - Tricuspid valve: Mildly thickened leaflets. Prolapse of the   septal lealfet. Mild regurgitation. No vegetation is noted. - Pulmonic valve: No evidence of vegetation. - Impressions: No evidence of endocarditis. LVEF 55-60%. Small PFO.  Impressions:  - No evidence of endocarditis. LVEF 55-60%. Small PFO.   Antimicrobials:   Cefazolin 4/16 >>    Subjective: Feels okay.  Sorry to have left the hospital.  Feels like she just "freaks out" sometimes, and is also under a lot of stress due to overdose death of a friend recently.  Pain in neck is intermittent, moderate, controlled with suboxone, Voltaren.  Also notes moderate right knee pain, over seveal weeks, sometimes with swelling and redness (although this waxes and wanes).  Objective: Vitals:   10/03/17 1300 10/03/17 1345 10/03/17 1422 10/03/17 1455  BP:   126/79 123/84  Pulse: (!) 103 91 98 96  Resp:   16 16  Temp:    98.3 F (36.8 C)  TempSrc:    Oral  SpO2: 100% 99% 97% 98%  Weight:      Height:        Intake/Output Summary (Last 24 hours) at 10/03/2017 1612 Last data filed at 10/03/2017 0024 Gross per 24 hour  Intake 100 ml  Output -  Net 100 ml   Filed Weights   10/02/17  1737  Weight: 47.6 kg (105 lb)    Examination: General appearance: Thin adult female, alert and in no acute distress.   HEENT: Anicteric, conjunctiva pink, lids and lashes normal. No nasal deformity, discharge, epistaxis.  Lips moist, teeth normal, OP moist, no oral lesions.   Skin: Warm and dry.  No jaundice.  No suspicious rashes or lesions. Cardiac: RRR, nl S1-S2, no murmurs appreciated.  Capillary refill is brisk.  JVP normal.  No LE edema.  Radial pulses 2+ and symmetric. Respiratory: Normal respiratory rate and rhythm.  CTAB without rales or wheezes. Abdomen: Abdomen soft.  No TTP. No ascites, distension, hepatosplenomegaly.   MSK: No deformities or effusions of the right knee, ROM passively normal without pain toay. Neuro: Awake and alert.  EOMI, moves all extremities. Speech fluent.    Psych: Sensorium intact and responding to questions, attention normal. Affect normal.  Judgment and insight appear normal.    Data Reviewed: I have personally reviewed following labs and imaging studies:  CBC: Recent Labs  Lab 09/27/17 0623 09/30/17 0708 10/02/17 2117 10/03/17 0831  WBC 9.4 7.9 10.4 9.6  NEUTROABS  --   --  4.9 5.4  HGB 11.0* 10.8* 10.1* 10.8*  HCT 36.6 35.2* 33.3* 35.8*  MCV 78.2 78.7 79.1 80.3  PLT 677* 533* 496* 324*   Basic Metabolic Panel: Recent Labs  Lab 09/27/17 0623 09/30/17 0945 10/02/17 2117 10/03/17 0831  NA 136 136 136 137  K 4.3 4.0 3.3* 4.3  CL 105 103 105 104  CO2 23 23 25 26   GLUCOSE 85 121* 112* 74  BUN 12 11 14 11   CREATININE 0.51 0.50 0.49 0.49  CALCIUM 9.1 8.9 8.0* 8.7*  MG  --   --   --  1.9   GFR: Estimated Creatinine Clearance: 77.3 mL/min (by C-G formula based on SCr of 0.49 mg/dL). Liver Function Tests: Recent Labs  Lab 09/30/17 0945 10/02/17 2117  AST 22 23  ALT 18 17  ALKPHOS 97 86  BILITOT 0.3 0.5  PROT 7.7 6.5  ALBUMIN 3.4* 2.9*   No results for input(s): LIPASE, AMYLASE in the last 168 hours. No results for  input(s): AMMONIA in the last 168 hours. Coagulation Profile: No results for input(s): INR, PROTIME in the last 168 hours. Cardiac Enzymes: No results for input(s): CKTOTAL, CKMB, CKMBINDEX, TROPONINI in the last 168 hours. BNP (last 3 results) No results for input(s): PROBNP in the last 8760 hours. HbA1C: No results for input(s): HGBA1C in the last 72 hours. CBG: No results for input(s): GLUCAP in the last 168 hours. Lipid Profile: No results for input(s): CHOL, HDL, LDLCALC, TRIG, CHOLHDL, LDLDIRECT in the last 72 hours. Thyroid Function Tests: No results for input(s): TSH, T4TOTAL, FREET4, T3FREE, THYROIDAB in the last 72 hours. Anemia Panel: No results for input(s): VITAMINB12, FOLATE, FERRITIN, TIBC, IRON, RETICCTPCT in the last 72 hours. Urine analysis:    Component Value Date/Time   COLORURINE YELLOW 10/02/2017 2117   APPEARANCEUR CLOUDY (A) 10/02/2017 2117   LABSPEC 1.025 10/02/2017 2117  PHURINE 5.0 10/02/2017 2117   GLUCOSEU NEGATIVE 10/02/2017 2117   HGBUR NEGATIVE 10/02/2017 2117   BILIRUBINUR NEGATIVE 10/02/2017 2117   KETONESUR NEGATIVE 10/02/2017 2117   PROTEINUR NEGATIVE 10/02/2017 2117   UROBILINOGEN 0.2 10/15/2012 1341   NITRITE NEGATIVE 10/02/2017 2117   LEUKOCYTESUR NEGATIVE 10/02/2017 2117   Sepsis Labs: @LABRCNTIP (procalcitonin:4,lacticacidven:4)  ) Recent Results (from the past 240 hour(s))  Culture, blood (routine x 2)     Status: None   Collection Time: 09/28/17 11:30 AM  Result Value Ref Range Status   Specimen Description BLOOD RIGHT HAND  Final   Special Requests   Final    BOTTLES DRAWN AEROBIC ONLY Blood Culture adequate volume   Culture   Final    NO GROWTH 5 DAYS Performed at [facility], [place] [address], [place], [place] [address]    Report Status 10/03/2017 FINAL  Final  Culture, blood (routine x 2)     Status: None   Collection Time: 09/28/17 11:45 AM  Result Value Ref Range Status   Specimen Description BLOOD LEFT HAND   Final   Special Requests   Final    BOTTLES DRAWN AEROBIC ONLY Blood Culture adequate volume   Culture   Final    NO GROWTH 5 DAYS Performed at [facility], [place] [address] [place], [place] [address]    Report Status 10/03/2017 FINAL  Final         Radiology Studies: No results found.      Scheduled Meds: . buprenorphine-naloxone  1 tablet Sublingual BID  . diclofenac sodium  2 g Topical QID  . enoxaparin (LOVENOX) injection  40 mg Subcutaneous Daily  . gabapentin  200 mg Oral QHS  . gabapentin  400 mg Oral TID WC  . lidocaine  1 patch Transdermal Daily  . venlafaxine XR  75 mg Oral Q breakfast   Continuous Infusions: .  ceFAZolin (ANCEF) IV Stopped (10/03/17 1446)     LOS: 1 day    Time spent: 25 minutes    Edwin Dada, MD [facility] 10/03/2017, 12:30 PM     Pager [phone] --- please page though AMION:  [email or web address] Password [email or web address] If 7PM-7AM, please contact night-coverage

## 2017-10-03 NOTE — ED Notes (Signed)
Attempted to call report

## 2017-10-03 NOTE — ED Notes (Signed)
Meal tray ordered - lunch 

## 2017-10-04 ENCOUNTER — Inpatient Hospital Stay (HOSPITAL_COMMUNITY): Payer: Self-pay

## 2017-10-04 ENCOUNTER — Encounter (HOSPITAL_COMMUNITY): Payer: Self-pay | Admitting: General Practice

## 2017-10-04 ENCOUNTER — Other Ambulatory Visit: Payer: Self-pay

## 2017-10-04 DIAGNOSIS — R7881 Bacteremia: Secondary | ICD-10-CM

## 2017-10-04 DIAGNOSIS — B1920 Unspecified viral hepatitis C without hepatic coma: Secondary | ICD-10-CM

## 2017-10-04 DIAGNOSIS — M4642 Discitis, unspecified, cervical region: Secondary | ICD-10-CM

## 2017-10-04 DIAGNOSIS — F1910 Other psychoactive substance abuse, uncomplicated: Secondary | ICD-10-CM

## 2017-10-04 DIAGNOSIS — B9561 Methicillin susceptible Staphylococcus aureus infection as the cause of diseases classified elsewhere: Secondary | ICD-10-CM

## 2017-10-04 LAB — URINE CULTURE: Culture: NO GROWTH

## 2017-10-04 MED ORDER — BUPRENORPHINE HCL-NALOXONE HCL 8-2 MG SL SUBL
1.0000 | SUBLINGUAL_TABLET | Freq: Every day | SUBLINGUAL | Status: DC
  Administered 2017-10-05 – 2017-10-19 (×15): 1 via SUBLINGUAL
  Filled 2017-10-04 (×15): qty 1

## 2017-10-04 MED ORDER — BUPRENORPHINE HCL-NALOXONE HCL 2-0.5 MG SL SUBL: 2.0000 | SUBLINGUAL_TABLET | Freq: Every day | SUBLINGUAL | Status: DC

## 2017-10-04 MED ORDER — DICLOFENAC SODIUM 1 % TD GEL: 2.0000 g | Freq: Four times a day (QID) | TRANSDERMAL | Status: DC | PRN

## 2017-10-04 MED ORDER — ORITAVANCIN DIPHOSPHATE 400 MG IV SOLR
1200.0000 mg | Freq: Once | INTRAVENOUS | Status: AC
  Administered 2017-10-04: 1200 mg via INTRAVENOUS
  Filled 2017-10-04: qty 120

## 2017-10-04 MED ORDER — BUPRENORPHINE HCL-NALOXONE HCL 2-0.5 MG SL SUBL
2.0000 | SUBLINGUAL_TABLET | Freq: Every day | SUBLINGUAL | Status: DC
  Administered 2017-10-04 – 2017-10-07 (×4): 2 via SUBLINGUAL
  Filled 2017-10-04 (×4): qty 2

## 2017-10-04 NOTE — Progress Notes (Signed)
PROGRESS NOTE    Sherri Francis  EGB:[id number] DOB: [birth year] DOA: 10/02/2017 PCP: Patient, No Pcp Per   Brief Narrative:  Sherri Francis is Sherri Francis 32 y.o. 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  She left AMA 4/16, then readmitted 4/17 for resumption of treatment, had TEE 4/22 that showed resolution of her tricuspid vegetation, resumed cefazolin.  Started suboxone.  ID consulted, recommended 8 weeks therapy.  Patient left AMA again 4/27, returned within 4 hours to resume treatment.  Assessment & Plan:   Principal Problem:   Acute osteomyelitis of cervical spine (HCC) Active Problems:   Bacteremia due to Staphylococcus aureus   Anemia   IV drug user   Hypokalemia   Bacteremia   MSSA bacteremia, previous endocarditis incompletely treated Cervical spine discitis - Cultures negative since 4/16 -Continue cefazolin start date 4/16 -Consult ID, appreciate recs - giving dose of dalbavancin here.  Recommending repeat dose in 2 weeks with ID and then 2 weeks after that as well.  Had long discussion today with her about her plans for rehab.  She's not keen on staying in the hospital long and I would not be surprised if she decided to leave immediately after she gets the dalbavancin.  In this case, would recommend she follow up with ID in 2 weeks for repeat dalbavancin injection.  Discussed with ID who also note she can take the augmentin previously prescribed as well.  Will continue to follow and try to work on safe d/c plan for her.  Right knee pain The waxing and waning swelling seem less likely to be from septic joint, although she reports persistent pain in this knee.   -MRI R knee with contrast ordered  Opioid use disorder -Continue Suboxone -> currently tapering  suboxone -Continue gabapentin, as needed hydroxyzine -Continue Voltaren gel as needed - she's working on getting to rehab.  She tells me she plans on doing this after completing her antibiotic therapy.    Hepatitis C Planning for outpatient treatment  Thrombocytosis -Repeat at least weekly while on cefazolin  Hypokalemia Resolved  Anemia of chronic disease Hemoglobin stable at baseline  Depression -Continue venlafaxine  DVT prophylaxis: lovenox Code Status: full  Family Communication: she declines me calling her mother or anyone on her contact list Disposition Plan: pending safe d/c plan  Consultants:   ID  Procedures:   none  Antimicrobials:  Anti-infectives (From admission, onward)   Start     Dose/Rate Route Frequency Ordered Stop   10/04/17 1200  Oritavancin Diphosphate (ORBACTIV) 1,200 mg in dextrose 5 % IVPB     1,200 mg 333.3 mL/hr over 180 Minutes Intravenous Once 10/04/17 1108 10/04/17 1600   10/03/17 0600  ceFAZolin (ANCEF) IVPB 2g/100 mL premix     2 g 200 mL/hr over 30 Minutes Intravenous Every 8 hours 10/02/17 2311     10/02/17 2315  ceFAZolin (ANCEF) IVPB 1 g/50 mL premix     1 g 100 mL/hr over 30 Minutes Intravenous  Once 10/02/17 2312 10/03/17 0024   10/02/17 2130  ceFAZolin (ANCEF) IVPB 1 g/50 mL premix     1 g 100 mL/hr over 30 Minutes Intravenous  Once 10/02/17 2118 10/02/17 2224      Subjective: Had long discussion about her plans regarding treatment of her infection and treatment of  substance abuse. Can't really tell me why she left the other day. She doesn't want to stay in the hospital.  She'd like to leave after she gets this antibiotic. Doing ok.   Plans on going to rehab after she finishes her treatment in [place].    Objective: Vitals:   10/03/17 1455 10/03/17 2046 10/04/17 0459 10/04/17 1503  BP: 123/84 115/61 113/73 133/72  Pulse: 96 87 86 95  Resp: 16 16 17    Temp: 98.3 F (36.8 C) 98.6 F (37 C) 97.9 F (36.6 C)  97.9 F (36.6 C)  TempSrc: Oral Oral Oral Oral  SpO2: 98% 99% 99% 100%  Weight: 47.6 kg (105 lb)     Height: 5\' 1"  (1.549 m)       Intake/Output Summary (Last 24 hours) at 10/04/2017 1840 Last data filed at 10/04/2017 1700 Gross per 24 hour  Intake 1200 ml  Output -  Net 1200 ml   Filed Weights   10/02/17 1737 10/03/17 1455  Weight: 47.6 kg (105 lb) 47.6 kg (105 lb)    Examination:  General exam: Appears calm and comfortable  Respiratory system: Clear to auscultation. Respiratory effort normal. Cardiovascular system: S1 & S2 heard, RRR. No JVD, murmurs, rubs, gallops or clicks. No pedal edema. Gastrointestinal system: Abdomen is nondistended, soft and nontender. No organomegaly or masses felt. Normal bowel sounds heard. Central nervous system: Alert and oriented. No focal neurological deficits. Extremities: Symmetric 5 x 5 power. Skin: No rashes, lesions or ulcers Psychiatry: Judgement and insight appear normal. Mood & affect appropriate.     Data Reviewed: I have personally reviewed following labs and imaging studies  CBC: Recent Labs  Lab 09/30/17 0708 10/02/17 2117 10/03/17 0831  WBC 7.9 10.4 9.6  NEUTROABS  --  4.9 5.4  HGB 10.8* 10.1* 10.8*  HCT 35.2* 33.3* 35.8*  MCV 78.7 79.1 80.3  PLT 533* 496* 937*   Basic Metabolic Panel: Recent Labs  Lab 09/30/17 0945 10/02/17 2117 10/03/17 0831  NA 136 136 137  K 4.0 3.3* 4.3  CL 103 105 104  CO2 23 25 26   GLUCOSE 121* 112* 74  BUN 11 14 11   CREATININE 0.50 0.49 0.49  CALCIUM 8.9 8.0* 8.7*  MG  --   --  1.9   GFR: Estimated Creatinine Clearance: 77.3 mL/min (by C-G formula based on SCr of 0.49 mg/dL). Liver Function Tests: Recent Labs  Lab 09/30/17 0945 10/02/17 2117  AST 22 23  ALT 18 17  ALKPHOS 97 86  BILITOT 0.3 0.5  PROT 7.7 6.5  ALBUMIN 3.4* 2.9*   No results for input(s): LIPASE, AMYLASE in the last 168 hours. No results for input(s): AMMONIA in the last 168 hours. Coagulation  Profile: No results for input(s): INR, PROTIME in the last 168 hours. Cardiac Enzymes: No results for input(s): CKTOTAL, CKMB, CKMBINDEX, TROPONINI in the last 168 hours. BNP (last 3 results) No results for input(s): PROBNP in the last 8760 hours. HbA1C: No results for input(s): HGBA1C in the last 72 hours. CBG: No results for input(s): GLUCAP in the last 168 hours. Lipid Profile: No results for input(s): CHOL, HDL, LDLCALC, TRIG, CHOLHDL, LDLDIRECT in the last 72 hours. Thyroid Function Tests: No results for input(s): TSH, T4TOTAL, FREET4, T3FREE, THYROIDAB in the last 72 hours. Anemia Panel: No results for input(s): VITAMINB12, FOLATE, FERRITIN, TIBC, IRON, RETICCTPCT in the last 72 hours. Sepsis Labs: No results for input(s): PROCALCITON, LATICACIDVEN in the last 168 hours.  Recent Results (from the past 240  hour(s))  Culture, blood (routine x 2)     Status: None   Collection Time: 09/28/17 11:30 AM  Result Value Ref Range Status   Specimen Description BLOOD RIGHT HAND  Final   Special Requests   Final    BOTTLES DRAWN AEROBIC ONLY Blood Culture adequate volume   Culture   Final    NO GROWTH 5 DAYS Performed at [facility], [address], [place], [place] [address]    Report Status 10/03/2017 FINAL  Final  Culture, blood (routine x 2)     Status: None   Collection Time: 09/28/17 11:45 AM  Result Value Ref Range Status   Specimen Description BLOOD LEFT HAND  Final   Special Requests   Final    BOTTLES DRAWN AEROBIC ONLY Blood Culture adequate volume   Culture   Final    NO GROWTH 5 DAYS Performed at [facility], [place] [address], [place], [place] [address]    Report Status 10/03/2017 FINAL  Final  Urine culture     Status: None   Collection Time: 10/02/17  9:17 PM  Result Value Ref Range Status   Specimen Description URINE, RANDOM  Final   Special Requests NONE  Final   Culture   Final    NO GROWTH Performed at [facility], [address],  [place], [place] [address]    Report Status 10/04/2017 FINAL  Final         Radiology Studies: No results found.      Scheduled Meds: . buprenorphine-naloxone  2 tablet Sublingual QHS  . [START ON 10/05/2017] buprenorphine-naloxone  1 tablet Sublingual Daily  . diclofenac sodium  2 g Topical QID  . enoxaparin (LOVENOX) injection  40 mg Subcutaneous Daily  . gabapentin  200 mg Oral QHS  . gabapentin  400 mg Oral TID WC  . lidocaine  1 patch Transdermal Daily  . venlafaxine XR  75 mg Oral Q breakfast   Continuous Infusions: .  ceFAZolin (ANCEF) IV Stopped (10/04/17 1638)     LOS: 2 days    Time spent: 30 min    Fayrene Helper, MD [facility] Pager [phone]  If 7PM-7AM, please contact night-coverage [email or web address] Password [facility] 10/04/2017, 6:40 PM

## 2017-10-04 NOTE — Social Work (Signed)
CSW acknowledging consult for "homeless issues." Pt is re-admit for long term iv abx. Per ID note, pt will discharge to rehab in [place], also has mother as available support system, but will support with resources when discharged or if pt chooses to leave AMA. CSW will continue to follow and remain available for pt during hospitalization and to support disposition planning.   Alexander Mt, [place] Work [phone]

## 2017-10-04 NOTE — Progress Notes (Signed)
INFECTIOUS DISEASE PROGRESS NOTE  ID: Sherri Francis is a 32 y.o. female with  Principal Problem:   Acute osteomyelitis of cervical spine ([place]) Active Problems:   Bacteremia due to Staphylococcus aureus   Anemia   IV drug user   Hypokalemia   Bacteremia  Subjective: Without complaints.  States she left hospital for 1h just to be out. She denies using drugs while out of hospital.   Abtx:  Anti-infectives (From admission, onward)   Start     Dose/Rate Route Frequency Ordered Stop   10/04/17 1200  Oritavancin Diphosphate (ORBACTIV) 1,200 mg in dextrose 5 % IVPB     1,200 mg 333.3 mL/hr over 180 Minutes Intravenous Once 10/04/17 1108     10/03/17 0600  ceFAZolin (ANCEF) IVPB 2g/100 mL premix     2 g 200 mL/hr over 30 Minutes Intravenous Every 8 hours 10/02/17 2311     10/02/17 2315  ceFAZolin (ANCEF) IVPB 1 g/50 mL premix     1 g 100 mL/hr over 30 Minutes Intravenous  Once 10/02/17 2312 10/03/17 0024   10/02/17 2130  ceFAZolin (ANCEF) IVPB 1 g/50 mL premix     1 g 100 mL/hr over 30 Minutes Intravenous  Once 10/02/17 2118 10/02/17 2224      Medications:  Scheduled: . buprenorphine-naloxone  1 tablet Sublingual BID  . diclofenac sodium  2 g Topical QID  . enoxaparin (LOVENOX) injection  40 mg Subcutaneous Daily  . gabapentin  200 mg Oral QHS  . gabapentin  400 mg Oral TID WC  . lidocaine  1 patch Transdermal Daily  . venlafaxine XR  75 mg Oral Q breakfast    Objective: Vital signs in last 24 hours: Temp:  [97.9 F (36.6 C)-98.6 F (37 C)] 97.9 F (36.6 C) (04/29 0459) Pulse Rate:  [86-107] 86 (04/29 0459) Resp:  [16-17] 17 (04/29 0459) BP: (108-126)/(61-84) 113/73 (04/29 0459) SpO2:  [97 %-100 %] 99 % (04/29 0459) Weight:  [47.6 kg (105 lb)] 47.6 kg (105 lb) (04/28 1455)   General appearance: alert, cooperative and no distress Resp: clear to auscultation bilaterally Cardio: regular rate and rhythm GI: normal findings: bowel sounds normal and soft,  non-tender  Lab Results Recent Labs    10/02/17 2117 10/03/17 0831  WBC 10.4 9.6  HGB 10.1* 10.8*  HCT 33.3* 35.8*  NA 136 137  K 3.3* 4.3  CL 105 104  CO2 25 26  BUN 14 11  CREATININE 0.49 0.49   Liver Panel Recent Labs    10/02/17 2117  PROT 6.5  ALBUMIN 2.9*  AST 23  ALT 17  ALKPHOS 86  BILITOT 0.5   Sedimentation Rate Recent Labs    10/02/17 2117  ESRSEDRATE 18   C-Reactive Protein Recent Labs    10/02/17 2117  CRP 0.9    Microbiology: Recent Results (from the past 240 hour(s))  Culture, blood (routine x 2)     Status: None   Collection Time: 09/28/17 11:30 AM  Result Value Ref Range Status   Specimen Description BLOOD RIGHT HAND  Final   Special Requests   Final    BOTTLES DRAWN AEROBIC ONLY Blood Culture adequate volume   Culture   Final    NO GROWTH 5 DAYS Performed at [facility], [address] [place], [place] [address]    Report Status 10/03/2017 FINAL  Final  Culture, blood (routine x 2)     Status: None   Collection Time: 09/28/17 11:45 AM  Result  Value Ref Range Status   Specimen Description BLOOD LEFT HAND  Final   Special Requests   Final    BOTTLES DRAWN AEROBIC ONLY Blood Culture adequate volume   Culture   Final    NO GROWTH 5 DAYS Performed at [facility], [address], [place], [place] [address]    Report Status 10/03/2017 FINAL  Final  Urine culture     Status: None   Collection Time: 10/02/17  9:17 PM  Result Value Ref Range Status   Specimen Description URINE, RANDOM  Final   Special Requests NONE  Final   Culture   Final    NO GROWTH Performed at [facility], [place] [address], [place], [place] [address]    Report Status 10/04/2017 FINAL  Final    Studies/Results: No results found.   Assessment/Plan: C spine discitis MSSA bacteremia IVDA Hep C 1a  Total days of antibiotics:12 ancef  Will give her dose of dalbavancin, will stay in her system for 2 weeks. She will need repeat dose  in 2 weeks and then 2 weeks after that.  Will keep her on ancef (while she in hospital) as well.   She states she is going to rehab in [place]. She states she cannot go on suboxone to this facilty. Will start to taper her suboxone.  Will continue to follow         Bobby Rumpf MD, FACP Infectious Diseases (pager) [phone] [email or web address] 10/04/2017, 11:09 AM  LOS: 2 days

## 2017-10-05 MED ORDER — PROPRANOLOL HCL 10 MG PO TABS
10.0000 mg | ORAL_TABLET | Freq: Once | ORAL | Status: AC
  Administered 2017-10-05: 10 mg via ORAL
  Filled 2017-10-05: qty 1

## 2017-10-05 NOTE — Progress Notes (Addendum)
PROGRESS NOTE    Sherri Francis  NFA:[id number] DOB: 1985-09-27 DOA: 10/02/2017 PCP: Patient, No Pcp Per   Brief Narrative:  Sherri Francis is a 32 y.o. 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  She left AMA 4/16, then readmitted 4/17 for resumption of treatment, had TEE 4/22 that showed resolution of her tricuspid vegetation, resumed cefazolin.  Started suboxone.  ID consulted, recommended 8 weeks therapy.  Patient left AMA again 4/27, returned within 4 hours to resume treatment.  Assessment & Plan:   Principal Problem:   Acute osteomyelitis of cervical spine (HCC) Active Problems:   Bacteremia due to Staphylococcus aureus   Anemia   IV drug user   Hypokalemia   Bacteremia   MSSA bacteremia, previous endocarditis incompletely treated Cervical spine discitis - Cultures negative since 4/16 -Continue cefazolin start date 4/16 - Had long discussion with both patient and mom in room today.   -Consult ID, appreciate recs - gave dose of dalbavancin here.  Recommending repeat dose in 2 weeks and then 2 weeks after that as well.  - Difficult situation.  Yesterday, the patient told me she wanted to leave the hospital and I thought she may leave AMA again after the dalbavancin.  Had long discussion with patient and her mother today.  They want to complete her therapy in the hospital.  She doesn't have a safe discharge plan as she's essentially homeless.  She doesn't think she'd be able to follow up with ID as an outpatient without reliable transportation and also feels she'd likely relapse if she were discharged prior to completion.  As at this point, she has no immediate plans for rehab, the safest plan of action would likely be to complete her treatment here. With this in mind, I'm not  sure additional doses of dalbavancin would be indicated here in house (unless giving it for the dose 2 weeks prior completion of therapy).   - If she does leave AMA, an option would be to follow up at the ID clinic for dalbavancin and taking augmentin PO for the remainder of her course (previously discussed this with ID) - but the patient states today that she doesn't think she'd have transportation in order to have success in this method.  Right knee pain -MRI R knee with contrast ordered (notable for mild cellulitis)- - continue to monitor  Opioid use disorder -Continue Suboxone -> taper started by ID as she said she was going to rehab in [place] where she can't be on suboxone.  As noted above, will likely complete treatment here unless safe discharge plan decided on before that time.  She's on 8 of buprenorphine qAM and 4 qPM.  Will continue this for now.   -Continue gabapentin, as needed hydroxyzine -Continue Voltaren gel as needed - she's working on getting to rehab.  She tells me she plans on doing this after completing her antibiotic therapy.    Anxiety  Panic Attack  Tachycardia: pt anxious when I was in room, thought she may be having panic attack.  Given hydroxyzine and propanolol.  Continue to monitor.  Follow TSH.   Hepatitis C Planning for outpatient treatment  Thrombocytosis - 2/2 infection? Or related to ancef?  Hypokalemia Resolved  Anemia of chronic disease Hemoglobin stable at baseline  Depression -Continue venlafaxine  DVT  prophylaxis: lovenox Code Status: full  Family Communication: mother at bedside Disposition Plan: pending safe d/c plan  Consultants:   ID  Procedures:   none  Antimicrobials:  Anti-infectives (From admission, onward)   Start     Dose/Rate Route Frequency Ordered Stop   10/04/17 1200  Oritavancin Diphosphate (ORBACTIV) 1,200 mg in dextrose 5 % IVPB     1,200 mg 333.3 mL/hr over 180 Minutes Intravenous Once 10/04/17 1108  10/04/17 1600   10/03/17 0600  ceFAZolin (ANCEF) IVPB 2g/100 mL premix     2 g 200 mL/hr over 30 Minutes Intravenous Every 8 hours 10/02/17 2311     10/02/17 2315  ceFAZolin (ANCEF) IVPB 1 g/50 mL premix     1 g 100 mL/hr over 30 Minutes Intravenous  Once 10/02/17 2312 10/03/17 0024   10/02/17 2130  ceFAZolin (ANCEF) IVPB 1 g/50 mL premix     1 g 100 mL/hr over 30 Minutes Intravenous  Once 10/02/17 2118 10/02/17 2224      Subjective: Long discussion with pt and mother. They want to keep her here for the completion of her treatment. Not interested in the long acting med.  Pt feeling anxious.  Felt like she's having panic attack.   Objective: Vitals:   10/04/17 2057 10/04/17 2238 10/05/17 0505 10/05/17 1337  BP: (!) 146/107 (!) 143/84 (!) 144/89 128/80  Pulse: (!) 131 (!) 135 (!) 116 (!) 117  Resp: 20  19   Temp: 98.5 F (36.9 C)  98.4 F (36.9 C) 98.4 F (36.9 C)  TempSrc: Oral  Oral Oral  SpO2: 99%  100% 100%  Weight:      Height:        Intake/Output Summary (Last 24 hours) at 10/05/2017 1510 Last data filed at 10/05/2017 0525 Gross per 24 hour  Intake 580 ml  Output -  Net 580 ml   Filed Weights   10/02/17 1737 10/03/17 1455  Weight: 47.6 kg (105 lb) 47.6 kg (105 lb)    Examination:  General: No acute distress. Cardiovascular: Heart sounds show a regular rate, and rhythm. No gallops or rubs. No murmurs. No JVD. Lungs: Clear to auscultation bilaterally with good air movement. No rales, rhonchi or wheezes. Abdomen: Soft, nontender, nondistended with normal active bowel sounds. No masses. No hepatosplenomegaly. Neurological: Alert and oriented 3. Moves all extremities 4. Cranial nerves II through XII grossly intact. Skin: Warm and dry. No rashes or lesions. Extremities: No clubbing or cyanosis. No edema Psychiatric: Mood and affect are normal. Insight and judgment are appropriate.  Data Reviewed: I have personally reviewed following labs and imaging  studies  CBC: Recent Labs  Lab 09/30/17 0708 10/02/17 2117 10/03/17 0831  WBC 7.9 10.4 9.6  NEUTROABS  --  4.9 5.4  HGB 10.8* 10.1* 10.8*  HCT 35.2* 33.3* 35.8*  MCV 78.7 79.1 80.3  PLT 533* 496* 269*   Basic Metabolic Panel: Recent Labs  Lab 09/30/17 0945 10/02/17 2117 10/03/17 0831  NA 136 136 137  K 4.0 3.3* 4.3  CL 103 105 104  CO2 23 25 26   GLUCOSE 121* 112* 74  BUN 11 14 11   CREATININE 0.50 0.49 0.49  CALCIUM 8.9 8.0* 8.7*  MG  --   --  1.9   GFR: Estimated Creatinine Clearance: 77.3 mL/min (by C-G formula based on SCr of 0.49 mg/dL). Liver Function Tests: Recent Labs  Lab 09/30/17 0945 10/02/17 2117  AST 22 23  ALT 18 17  ALKPHOS 97 86  BILITOT 0.3  0.5  PROT 7.7 6.5  ALBUMIN 3.4* 2.9*   No results for input(s): LIPASE, AMYLASE in the last 168 hours. No results for input(s): AMMONIA in the last 168 hours. Coagulation Profile: No results for input(s): INR, PROTIME in the last 168 hours. Cardiac Enzymes: No results for input(s): CKTOTAL, CKMB, CKMBINDEX, TROPONINI in the last 168 hours. BNP (last 3 results) No results for input(s): PROBNP in the last 8760 hours. HbA1C: No results for input(s): HGBA1C in the last 72 hours. CBG: No results for input(s): GLUCAP in the last 168 hours. Lipid Profile: No results for input(s): CHOL, HDL, LDLCALC, TRIG, CHOLHDL, LDLDIRECT in the last 72 hours. Thyroid Function Tests: No results for input(s): TSH, T4TOTAL, FREET4, T3FREE, THYROIDAB in the last 72 hours. Anemia Panel: No results for input(s): VITAMINB12, FOLATE, FERRITIN, TIBC, IRON, RETICCTPCT in the last 72 hours. Sepsis Labs: No results for input(s): PROCALCITON, LATICACIDVEN in the last 168 hours.  Recent Results (from the past 240 hour(s))  Culture, blood (routine x 2)     Status: None   Collection Time: 09/28/17 11:30 AM  Result Value Ref Range Status   Specimen Description BLOOD RIGHT HAND  Final   Special Requests   Final    BOTTLES DRAWN  AEROBIC ONLY Blood Culture adequate volume   Culture   Final    NO GROWTH 5 DAYS Performed at [facility], [address], [place], [place] [address]    Report Status 10/03/2017 FINAL  Final  Culture, blood (routine x 2)     Status: None   Collection Time: 09/28/17 11:45 AM  Result Value Ref Range Status   Specimen Description BLOOD LEFT HAND  Final   Special Requests   Final    BOTTLES DRAWN AEROBIC ONLY Blood Culture adequate volume   Culture   Final    NO GROWTH 5 DAYS Performed at [facility], [place] [address] [place], [place] [address]    Report Status 10/03/2017 FINAL  Final  Urine culture     Status: None   Collection Time: 10/02/17  9:17 PM  Result Value Ref Range Status   Specimen Description URINE, RANDOM  Final   Special Requests NONE  Final   Culture   Final    NO GROWTH Performed at [facility], [place] [address] [place], [place] [address]    Report Status 10/04/2017 FINAL  Final         Radiology Studies: Mr Knee Right Wo Contrast  Result Date: 10/05/2017 CLINICAL DATA:  Right knee pain and swelling. History of IV drug abuse. EXAM: MRI OF THE RIGHT KNEE WITHOUT CONTRAST TECHNIQUE: Multiplanar, multisequence MR imaging of the knee was performed. No intravenous contrast was administered. COMPARISON:  None. FINDINGS: MENISCI Medial meniscus:  Intact Lateral meniscus:  Intact LIGAMENTS Cruciates:  Intact.  Mucoid degeneration of the ACL. Collaterals:  Intact CARTILAGE Patellofemoral:  Normal Medial:  Normal Lateral:  Normal Joint:  No joint effusion or synovitis. Popliteal Fossa:  No popliteal mass or Baker's cyst. Extensor Mechanism: The patella retinacular structures are intact and the quadriceps and patellar tendons are intact. Bones:  No acute bony findings.  No evidence of osteomyelitis. Other: Mild subcutaneous soft tissue swelling/edema/fluid suggesting cellulitis. No drainable soft tissue abscess. IMPRESSION: 1. No MR findings to suggest septic  arthritis, osteomyelitis or drainable soft tissue abscess. 2. Mild diffuse cellulitis. 3. No findings for internal derangement. Electronically Signed   By: Marijo Sanes M.D.   On: 10/05/2017  10:26        Scheduled Meds: . buprenorphine-naloxone  2 tablet Sublingual QHS  . buprenorphine-naloxone  1 tablet Sublingual Daily  . enoxaparin (LOVENOX) injection  40 mg Subcutaneous Daily  . gabapentin  200 mg Oral QHS  . gabapentin  400 mg Oral TID WC  . lidocaine  1 patch Transdermal Daily  . propranolol  10 mg Oral Once  . venlafaxine XR  75 mg Oral Q breakfast   Continuous Infusions: .  ceFAZolin (ANCEF) IV 2 g (10/05/17 1415)     LOS: 3 days    Time spent: 30 min    Fayrene Helper, MD [facility] Pager [phone]  If 7PM-7AM, please contact night-coverage [email or web address] Password [email or web address] 10/05/2017, 3:10 PM

## 2017-10-05 NOTE — Progress Notes (Signed)
Pharmacy Antibiotic Note  Sherri Francis is a 32 y.o. female admitted on 10/02/2017 with known MSSA bacteremia and osteomyelitis.  Pharmacy has been consulted to continue Ancef, initially planned through 11/03/17.    Renal function stable, afebrile, WBC.  She is s/p oritavancin on 10/04/17 with plan for 2 more doses.    Plan: Continue Ancef 2g IV Q8H while inpatient per ID Pharmacy will sign off as dosage adjustment is likely unnecessary.  Thank you for the consult! Oritavancin per ID  Height: 5\' 1"  (154.9 cm) Weight: 105 lb (47.6 kg) IBW/kg (Calculated) : 47.8  Temp (24hrs), Avg:98.3 F (36.8 C), Min:97.9 F (36.6 C), Max:98.5 F (36.9 C)  Recent Labs  Lab 09/30/17 0708 09/30/17 0945 10/02/17 2117 10/03/17 0831  WBC 7.9  --  10.4 9.6  CREATININE  --  0.50 0.49 0.49    Estimated Creatinine Clearance: 77.3 mL/min (by C-G formula based on SCr of 0.49 mg/dL).    No Known Allergies    Colbey Wirtanen D. Mina Marble, PharmD, BCPS, BCCCP Pager:  [phone] 10/05/2017, 9:12 AM

## 2017-10-06 NOTE — Plan of Care (Signed)
  Problem: Health Behavior/Discharge Planning: Goal: Ability to manage health-related needs will improve Outcome: Progressing   Problem: Coping: Goal: Level of anxiety will decrease Outcome: Progressing   

## 2017-10-06 NOTE — Progress Notes (Addendum)
PROGRESS NOTE  Sherri Francis [id number]:[id number] DOB: 10-12-1985 DOA: 10/02/2017 PCP: Patient, No Pcp Per  Brief Narrative:  Sherri Francis is a 32 y.o. 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 She left AMA 4/16, then readmitted 4/17 for resumption of treatment, had TEE 4/22 that showed resolution of her tricuspid vegetation, resumed cefazolin. Started suboxone. ID consulted, recommended 8 weeks therapy. Patient left AMA again 4/27, returned within 4 hours to resume treatment.     HPI/Recap of past 24 hours:  She declined examination this am, she reports wants to sleep more, does not want light to be turned on  She refused neurontin and lovenox injection  Assessment/Plan: Principal Problem:   Acute osteomyelitis of cervical spine (HCC) Active Problems:   Bacteremia due to Staphylococcus aureus   Anemia   IV drug user   Hypokalemia   Bacteremia  MSSA bacteremia, previous endocarditis incompletely treated Cervical spine discitis -mri cervical spine "Abnormal prevertebral edema extending from C1-2 inferiorly through C5. " -TEE on 4/22 ,negative for endocarditis  - Cultures negative since 4/16 -Continue cefazolin start date 4/16 -she received one dose of oritavancin on 4/29 -I have discussed with infectious disease Dr hatcher who recommended patient can be discharged on keflex and return to id clinic in two weeks. ID will arrange outpatient dalbavancin. Patient will need total of 6 weeks of treatment, day one from 4/16. -will discuss with patient and mother, it appear that patient and mother is worried about relapse and not able to follow up with ID reliably,   Right knee pain: IMPRESSION: 1. No MR findings to suggest septic arthritis, osteomyelitis or drainable soft  tissue abscess. 2. Mild diffuse cellulitis. 3. No findings for internal derangement.   Opioid use disorder -Continue Suboxone -> taper started by ID as she said she was going to rehab in [place] where she can't be on suboxone -She's on 8 of buprenorphine qAM and 4 qPM.  -will discuss with Dr Evette Doffing about tapering    Anxiety/depression: -Psychiatry consulted x2 on 4/18 and 4/22, Patient is psychiatrically cleared -Continue Gabapentin 200 mg TID for anxiety. she refused at times -Restart Effexor XR 75 mg daily for mood and anxiety.    Code Status: full  Family Communication: patient   Disposition Plan: need further discussion with patient and mother about disposition   Consultants:  ID  Cardiology  Psychiatry  Opioids use disorder/withdrawal management consult  Procedures:  As above  Antibiotics:  As above   Objective: BP (!) 131/93 (BP Location: Left Arm)   Pulse (!) 119   Temp 98.3 F (36.8 C) (Oral)   Resp 18   Ht 5\' 1"  (1.549 m)   Wt 47.6 kg (105 lb)   LMP 04/03/2017   SpO2 99%   BMI 19.84 kg/m  No intake or output data in the 24 hours ending 10/06/17 1105 Filed Weights   10/02/17 1737 10/03/17 1455  Weight: 47.6 kg (105 lb) 47.6 kg (105 lb)    Exam: Patient is examined daily including today on 10/06/2017, exams remain the same as of yesterday except that has changed    General:  NAD  Cardiovascular: RRR  Respiratory: CTABL  Abdomen: Soft/ND/NT, positive BS  Musculoskeletal: No Edema  Neuro: alert, oriented   Data Reviewed: Basic Metabolic Panel: Recent Labs  Lab  09/30/17 0945 10/02/17 2117 10/03/17 0831  NA 136 136 137  K 4.0 3.3* 4.3  CL 103 105 104  CO2 23 25 26   GLUCOSE 121* 112* 74  BUN 11 14 11   CREATININE 0.50 0.49 0.49  CALCIUM 8.9 8.0* 8.7*  MG  --   --  1.9   Liver Function Tests: Recent Labs  Lab 09/30/17 0945 10/02/17 2117  AST 22 23  ALT 18 17  ALKPHOS 97 86  BILITOT 0.3 0.5  PROT 7.7 6.5  ALBUMIN  3.4* 2.9*   No results for input(s): LIPASE, AMYLASE in the last 168 hours. No results for input(s): AMMONIA in the last 168 hours. CBC: Recent Labs  Lab 09/30/17 0708 10/02/17 2117 10/03/17 0831  WBC 7.9 10.4 9.6  NEUTROABS  --  4.9 5.4  HGB 10.8* 10.1* 10.8*  HCT 35.2* 33.3* 35.8*  MCV 78.7 79.1 80.3  PLT 533* 496* 513*   Cardiac Enzymes:   No results for input(s): CKTOTAL, CKMB, CKMBINDEX, TROPONINI in the last 168 hours. BNP (last 3 results) No results for input(s): BNP in the last 8760 hours.  ProBNP (last 3 results) No results for input(s): PROBNP in the last 8760 hours.  CBG: No results for input(s): GLUCAP in the last 168 hours.  Recent Results (from the past 240 hour(s))  Culture, blood (routine x 2)     Status: None   Collection Time: 09/28/17 11:30 AM  Result Value Ref Range Status   Specimen Description BLOOD RIGHT HAND  Final   Special Requests   Final    BOTTLES DRAWN AEROBIC ONLY Blood Culture adequate volume   Culture   Final    NO GROWTH 5 DAYS Performed at [facility], [address] [place], [place] [address]    Report Status 10/03/2017 FINAL  Final  Culture, blood (routine x 2)     Status: None   Collection Time: 09/28/17 11:45 AM  Result Value Ref Range Status   Specimen Description BLOOD LEFT HAND  Final   Special Requests   Final    BOTTLES DRAWN AEROBIC ONLY Blood Culture adequate volume   Culture   Final    NO GROWTH 5 DAYS Performed at [facility], [place] [address], [place], [place] [address]    Report Status 10/03/2017 FINAL  Final  Urine culture     Status: None   Collection Time: 10/02/17  9:17 PM  Result Value Ref Range Status   Specimen Description URINE, RANDOM  Final   Special Requests NONE  Final   Culture   Final    NO GROWTH Performed at [facility], [place] [address] [place],  [address]    Report Status 10/04/2017 FINAL  Final     Studies: No results found.  Scheduled Meds: .  buprenorphine-naloxone  2 tablet Sublingual QHS  . buprenorphine-naloxone  1 tablet Sublingual Daily  . enoxaparin (LOVENOX) injection  40 mg Subcutaneous Daily  . gabapentin  200 mg Oral QHS  . gabapentin  400 mg Oral TID WC  . lidocaine  1 patch Transdermal Daily  . venlafaxine XR  75 mg Oral Q breakfast    Continuous Infusions: .  ceFAZolin (ANCEF) IV Stopped (10/06/17 6237)     Time spent: 30mins I have personally reviewed and interpreted on  10/06/2017 daily labs, tele strips, imagings as discussed above under date review session and assessment and plans.  I reviewed all nursing notes, pharmacy notes, consultant notes,  vitals, pertinent old records  I have discussed plan of care as described above with RN , patient  on 10/06/2017   Florencia Reasons MD, PhD  [facility] Pager [phone]. If 7PM-7AM, please contact night-coverage at [email or web address], password [facility] 10/06/2017, 11:05 AM  LOS: 4 days

## 2017-10-07 NOTE — Plan of Care (Signed)
  Problem: Health Behavior/Discharge Planning: Goal: Ability to manage health-related needs will improve Outcome: Progressing   Problem: Clinical Measurements: Goal: Ability to maintain clinical measurements within normal limits will improve Outcome: Progressing Goal: Will remain free from infection Outcome: Progressing Goal: Diagnostic test results will improve Outcome: Progressing   

## 2017-10-07 NOTE — Progress Notes (Signed)
PROGRESS NOTE  Yannely Kintzel IWP:[id number] DOB: 08/15/1985 DOA: 10/02/2017 PCP: Patient, No Pcp Per  Brief Narrative:  Mrs. Gravelle is a 32 y.o. 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 She left AMA 4/16, then readmitted 4/17 for resumption of treatment, had TEE 4/22 that showed resolution of her tricuspid vegetation, resumed cefazolin. Started suboxone. ID consulted, recommended 8 weeks therapy. Patient left AMA again 4/27, returned within 4 hours to resume treatment.     HPI/Recap of past 24 hours:  She is feeling better today, she is ambulating in room   She continue to  refuse lovenox injection  Assessment/Plan: Principal Problem:   Acute osteomyelitis of cervical spine (HCC) Active Problems:   Bacteremia due to Staphylococcus aureus   Anemia   IV drug user   Hypokalemia   Bacteremia  MSSA bacteremia, previous endocarditis incompletely treated Cervical spine discitis -mri cervical spine "Abnormal prevertebral edema extending from C1-2 inferiorly through C5. " -TEE on 4/22 ,negative for endocarditis  - Cultures negative since 4/16 -Continue cefazolin start date 4/16 -she received one dose of oritavancin on 4/29 -I have discussed with infectious disease Dr hatcher who recommended patient can be discharged on keflex and return to id clinic in two weeks. ID will arrange outpatient dalbavancin. Patient will need total of 6 weeks of treatment, day one from 4/16. -I have discuss with patient , she does not have a same discharge plan, case discussed with case manager and social worker, patient will need to finish abx treatment in the hospital, then go to detox facility    Right knee pain: IMPRESSION: 1. No MR findings to suggest septic arthritis, osteomyelitis  or drainable soft tissue abscess. 2. Mild diffuse cellulitis. 3. No findings for internal derangement.   Opioid use disorder -Continue Suboxone -> taper started by ID as she said she was going to rehab in [place] where she can't be on suboxone -She's on 8 of buprenorphine qAM and 4 qPM.  -will discuss with Dr Evette Doffing about tapering    Anxiety/depression: -Psychiatry consulted x2 on 4/18 and 4/22, Patient is psychiatrically cleared -Continue Gabapentin 200 mg TID for anxiety. she refused at times -Restart Effexor XR 75 mg daily for mood and anxiety.    Code Status: full  Family Communication: patient   Disposition Plan: not able to safely discharge, will finish abx treatment in the hospital   Consultants:  ID  Cardiology  Psychiatry  Opioids use disorder/withdrawal management consult  Procedures:  As above  Antibiotics:  As above   Objective: BP 124/78 (BP Location: Right Arm)   Pulse 88   Temp 98 F (36.7 C) (Oral)   Resp 14   Ht 5\' 1"  (1.549 m)   Wt 47.6 kg (105 lb)   LMP 04/03/2017   SpO2 100%   BMI 19.84 kg/m  No intake or output data in the 24 hours ending 10/07/17 1802 Filed Weights   10/02/17 1737 10/03/17 1455  Weight: 47.6 kg (105 lb) 47.6 kg (105 lb)    Exam: Patient is examined daily including today on 10/07/2017, exams remain the same as of yesterday except that has changed    General:  NAD  Cardiovascular: RRR  Respiratory: CTABL  Abdomen: Soft/ND/NT, positive BS  Musculoskeletal: No Edema  Neuro: alert, oriented   Data Reviewed:  Basic Metabolic Panel: Recent Labs  Lab 10/02/17 2117 10/03/17 0831  NA 136 137  K 3.3* 4.3  CL 105 104  CO2 25 26  GLUCOSE 112* 74  BUN 14 11  CREATININE 0.49 0.49  CALCIUM 8.0* 8.7*  MG  --  1.9   Liver Function Tests: Recent Labs  Lab 10/02/17 2117  AST 23  ALT 17  ALKPHOS 86  BILITOT 0.5  PROT 6.5  ALBUMIN 2.9*   No results for input(s): LIPASE, AMYLASE in the last 168  hours. No results for input(s): AMMONIA in the last 168 hours. CBC: Recent Labs  Lab 10/02/17 2117 10/03/17 0831  WBC 10.4 9.6  NEUTROABS 4.9 5.4  HGB 10.1* 10.8*  HCT 33.3* 35.8*  MCV 79.1 80.3  PLT 496* 513*   Cardiac Enzymes:   No results for input(s): CKTOTAL, CKMB, CKMBINDEX, TROPONINI in the last 168 hours. BNP (last 3 results) No results for input(s): BNP in the last 8760 hours.  ProBNP (last 3 results) No results for input(s): PROBNP in the last 8760 hours.  CBG: No results for input(s): GLUCAP in the last 168 hours.  Recent Results (from the past 240 hour(s))  Culture, blood (routine x 2)     Status: None   Collection Time: 09/28/17 11:30 AM  Result Value Ref Range Status   Specimen Description BLOOD RIGHT HAND  Final   Special Requests   Final    BOTTLES DRAWN AEROBIC ONLY Blood Culture adequate volume   Culture   Final    NO GROWTH 5 DAYS Performed at [facility], [address], [place], [place] [address]    Report Status 10/03/2017 FINAL  Final  Culture, blood (routine x 2)     Status: None   Collection Time: 09/28/17 11:45 AM  Result Value Ref Range Status   Specimen Description BLOOD LEFT HAND  Final   Special Requests   Final    BOTTLES DRAWN AEROBIC ONLY Blood Culture adequate volume   Culture   Final    NO GROWTH 5 DAYS Performed at [facility], [place] [address], [place], [place] [address]    Report Status 10/03/2017 FINAL  Final  Urine culture     Status: None   Collection Time: 10/02/17  9:17 PM  Result Value Ref Range Status   Specimen Description URINE, RANDOM  Final   Special Requests NONE  Final   Culture   Final    NO GROWTH Performed at [facility], [place] [address], [place], [place] [address]    Report Status 10/04/2017 FINAL  Final     Studies: No results found.  Scheduled Meds: . buprenorphine-naloxone  2 tablet Sublingual QHS  . buprenorphine-naloxone  1 tablet Sublingual Daily  . enoxaparin (LOVENOX)  injection  40 mg Subcutaneous Daily  . gabapentin  200 mg Oral QHS  . gabapentin  400 mg Oral TID WC  . lidocaine  1 patch Transdermal Daily  . venlafaxine XR  75 mg Oral Q breakfast    Continuous Infusions: .  ceFAZolin (ANCEF) IV Stopped (10/07/17 1533)     Time spent: 62mins I have personally reviewed and interpreted on  10/07/2017 daily labs, tele strips, imagings as discussed above under date review session and assessment and plans.  I reviewed all nursing notes, pharmacy notes, consultant notes,  vitals, pertinent old records  I have discussed plan of care as described above with RN , patient  on 10/07/2017   Annamaria Boots  Erlinda Hong MD, PhD  [facility] Pager [phone]. If 7PM-7AM, please contact night-coverage at [email or web address], password [facility] 10/07/2017, 6:02 PM  LOS: 5 days

## 2017-10-08 MED ORDER — BUPRENORPHINE HCL-NALOXONE HCL 2-0.5 MG SL SUBL
1.0000 | SUBLINGUAL_TABLET | Freq: Every day | SUBLINGUAL | Status: DC
Start: 2017-10-08 — End: 2017-10-12
  Administered 2017-10-08 – 2017-10-11 (×4): 1 via SUBLINGUAL
  Filled 2017-10-08 (×4): qty 1

## 2017-10-08 NOTE — Progress Notes (Signed)
PROGRESS NOTE  Sherri Francis ZOX:[id number] DOB: 1986/02/12 DOA: 10/02/2017 PCP: Patient, No Pcp Per  Brief Narrative:  Sherri Francis is a 32 y.o. 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 Sherri Francis left AMA 4/16, then readmitted 4/17 for resumption of treatment, had TEE 4/22 that showed resolution of her tricuspid vegetation, resumed cefazolin. Started suboxone. ID consulted, recommended 8 weeks therapy. Patient left AMA again 4/27, returned within 4 hours to resume treatment.     HPI/Recap of past 24 hours:  No fever, uneventful night Sherri Francis has been refusing lovenox injection  Assessment/Plan: Principal Problem:   Acute osteomyelitis of cervical spine (HCC) Active Problems:   Bacteremia due to Staphylococcus aureus   Anemia   IV drug user   Hypokalemia   Bacteremia  MSSA bacteremia, previous endocarditis incompletely treated Cervical spine discitis -mri cervical spine "Abnormal prevertebral edema extending from C1-2 inferiorly through C5. " -TEE on 4/22 ,negative for endocarditis  - Cultures negative since 4/16 -Continue cefazolin start date 4/16 -Sherri Francis received one dose of oritavancin on 4/29 -I have discussed with infectious disease Dr hatcher who recommended patient can be discharged on keflex and return to id clinic in two weeks. ID will arrange outpatient dalbavancin. Patient will need total of 6 weeks of treatment, day one from 4/16. -I have discuss with patient , Sherri Francis does not have a same discharge plan, case discussed with case manager and social worker, patient will need to finish abx treatment in the hospital, then go to detox facility    Right knee pain: IMPRESSION: 1. No MR findings to suggest septic arthritis, osteomyelitis or drainable soft tissue  abscess. 2. Mild diffuse cellulitis. 3. No findings for internal derangement.   Opioid use disorder -Continue Suboxone -> taper started by ID as Sherri Francis said Sherri Francis was going to rehab in [place] ( for a 86month program )where Sherri Francis can't be on suboxone -Sherri Francis's on 8 of buprenorphine qAM and 4 qPM.  -case discussed with internal medicine and pharmacy, will try taper suboxone over 30days. -will discuss with Dr Evette Doffing about tapering    Anxiety/depression: -Psychiatry consulted x2 on 4/18 and 4/22, Patient is psychiatrically cleared -Continue Gabapentin 200 mg TID for anxiety. Sherri Francis refused at times -Restart Effexor XR 75 mg daily for mood and anxiety.    Code Status: full  Family Communication: patient   Disposition Plan: not able to safely discharge, will finish abx treatment in the hospital   Consultants:  ID  Cardiology  Psychiatry  Opioids use disorder/withdrawal management consult  Procedures:  As above  Antibiotics:  As above   Objective: BP (!) 102/57 (BP Location: Left Arm)   Pulse 94   Temp 98.5 F (36.9 C) (Oral)   Resp 17   Ht 5\' 1"  (1.549 m)   Wt 47.6 kg (105 lb)   LMP 04/03/2017   SpO2 99%   BMI 19.84 kg/m   Intake/Output Summary (Last 24 hours) at 10/08/2017 1114 Last data filed at 10/08/2017 0541 Gross per 24 hour  Intake 200 ml  Output -  Net 200 ml   Filed Weights   10/02/17 1737 10/03/17 1455  Weight: 47.6 kg (105 lb) 47.6 kg (105 lb)    Exam: Patient is examined daily including today on 10/08/2017, exams remain the same as of yesterday except that has  changed    General:  NAD  Cardiovascular: RRR  Respiratory: CTABL  Abdomen: Soft/ND/NT, positive BS  Musculoskeletal: No Edema  Neuro: alert, oriented   Data Reviewed: Basic Metabolic Panel: Recent Labs  Lab 10/02/17 2117 10/03/17 0831  NA 136 137  K 3.3* 4.3  CL 105 104  CO2 25 26  GLUCOSE 112* 74  BUN 14 11  CREATININE 0.49 0.49  CALCIUM 8.0* 8.7*  MG  --  1.9    Liver Function Tests: Recent Labs  Lab 10/02/17 2117  AST 23  ALT 17  ALKPHOS 86  BILITOT 0.5  PROT 6.5  ALBUMIN 2.9*   No results for input(s): LIPASE, AMYLASE in the last 168 hours. No results for input(s): AMMONIA in the last 168 hours. CBC: Recent Labs  Lab 10/02/17 2117 10/03/17 0831  WBC 10.4 9.6  NEUTROABS 4.9 5.4  HGB 10.1* 10.8*  HCT 33.3* 35.8*  MCV 79.1 80.3  PLT 496* 513*   Cardiac Enzymes:   No results for input(s): CKTOTAL, CKMB, CKMBINDEX, TROPONINI in the last 168 hours. BNP (last 3 results) No results for input(s): BNP in the last 8760 hours.  ProBNP (last 3 results) No results for input(s): PROBNP in the last 8760 hours.  CBG: No results for input(s): GLUCAP in the last 168 hours.  Recent Results (from the past 240 hour(s))  Culture, blood (routine x 2)     Status: None   Collection Time: 09/28/17 11:30 AM  Result Value Ref Range Status   Specimen Description BLOOD RIGHT HAND  Final   Special Requests   Final    BOTTLES DRAWN AEROBIC ONLY Blood Culture adequate volume   Culture   Final    NO GROWTH 5 DAYS Performed at [facility], [address], [place], [place] [address]    Report Status 10/03/2017 FINAL  Final  Culture, blood (routine x 2)     Status: None   Collection Time: 09/28/17 11:45 AM  Result Value Ref Range Status   Specimen Description BLOOD LEFT HAND  Final   Special Requests   Final    BOTTLES DRAWN AEROBIC ONLY Blood Culture adequate volume   Culture   Final    NO GROWTH 5 DAYS Performed at [facility], [place] [address], [place], [place] [address]    Report Status 10/03/2017 FINAL  Final  Urine culture     Status: None   Collection Time: 10/02/17  9:17 PM  Result Value Ref Range Status   Specimen Description URINE, RANDOM  Final   Special Requests NONE  Final   Culture   Final    NO GROWTH Performed at [facility], [place] [address], [place],  [address]    Report Status 10/04/2017 FINAL   Final     Studies: No results found.  Scheduled Meds: . buprenorphine-naloxone  2 tablet Sublingual QHS  . buprenorphine-naloxone  1 tablet Sublingual Daily  . enoxaparin (LOVENOX) injection  40 mg Subcutaneous Daily  . gabapentin  200 mg Oral QHS  . gabapentin  400 mg Oral TID WC  . lidocaine  1 patch Transdermal Daily  . venlafaxine XR  75 mg Oral Q breakfast    Continuous Infusions: .  ceFAZolin (ANCEF) IV Stopped (10/08/17 0541)     Time spent: 4mins, more than 50% time spent on coordination of care I have personally reviewed and interpreted on  10/08/2017 daily labs, tele strips, imagings as discussed above under date review session  and assessment and plans.  I reviewed all nursing notes, pharmacy notes, consultant notes,  vitals, pertinent old records  I have discussed plan of care as described above with RN , patient and mother over the phone on 10/08/2017   Florencia Reasons MD, PhD  [facility] Pager [phone]. If 7PM-7AM, please contact night-coverage at [email or web address], password [facility] 10/08/2017, 11:14 AM  LOS: 6 days

## 2017-10-08 NOTE — Progress Notes (Signed)
**  Patient is going to a drug rehab that will not allow suboxone so patient must be tapered prior to discharge. IM recommended to Dr. Erlinda Hong to taper 2-4 mg/week.  One source suggests Suboxone should generally be lowered in increments of 2 mg at a time every few days. If at any point withdrawal symptoms or drug cravings become more intense, the dosing may need to be readjusted, or the taper slowed. It may be necessary to go back to a previous dosage level to stabilize or to increase the amount of time in between when doses are reduced. Suboxone dosages can then be lowered again every two to three days until no more is needed and the drug is entirely out of the body. Other various tapering schedules can be round on the Internet.   Plan:  10/08/17: Decrease suboxone from total 12mg  daily (buprenorphine) to 10mg . F/u withdrawal symptoms and attempt to wean by another 2mg  within 3-5 days. Goal is complete full taper within 30 days or by discharge.  Treyshaun Keatts S. Alford Highland, PharmD, [place] Clinical Staff Pharmacist Pager [phone]

## 2017-10-08 NOTE — Social Work (Signed)
CSW continuing to follow for pt support, at this time aware pt will stay in hospital to complete iv abx treatment. Per MD, pt family states pt cannot come home to their home after discharge. CSW dept cannot ensure direct discharge to rehab facility but will meet to discuss discharge plans with pt as she has a particular rehab facility in [place] that she is interested in discharging to once abx are completed.   Alexander Mt, [place] Work [phone]

## 2017-10-09 LAB — BASIC METABOLIC PANEL
Anion gap: 9 (ref 5–15)
BUN: 9 mg/dL (ref 6–20)
CHLORIDE: 103 mmol/L (ref 101–111)
CO2: 25 mmol/L (ref 22–32)
Calcium: 8.7 mg/dL — ABNORMAL LOW (ref 8.9–10.3)
Creatinine, Ser: 0.48 mg/dL (ref 0.44–1.00)
Glucose, Bld: 111 mg/dL — ABNORMAL HIGH (ref 65–99)
POTASSIUM: 4.2 mmol/L (ref 3.5–5.1)
SODIUM: 137 mmol/L (ref 135–145)

## 2017-10-09 LAB — CBC
HCT: 34.1 % — ABNORMAL LOW (ref 36.0–46.0)
Hemoglobin: 10.4 g/dL — ABNORMAL LOW (ref 12.0–15.0)
MCH: 24.4 pg — ABNORMAL LOW (ref 26.0–34.0)
MCHC: 30.5 g/dL (ref 30.0–36.0)
MCV: 80 fL (ref 78.0–100.0)
PLATELETS: 484 10*3/uL — AB (ref 150–400)
RBC: 4.26 MIL/uL (ref 3.87–5.11)
RDW: 17.3 % — AB (ref 11.5–15.5)
WBC: 7.6 10*3/uL (ref 4.0–10.5)

## 2017-10-09 NOTE — Progress Notes (Signed)
PROGRESS NOTE  Sherri Francis FAO:[id number] DOB: [birth year] DOA: 10/02/2017 PCP: Patient, No Pcp Per  Brief Narrative:  Sherri Francis is a 32 y.o. 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 She left AMA 4/16, then readmitted 4/17 for resumption of treatment, had TEE 4/22 that showed resolution of her tricuspid vegetation, resumed cefazolin. Started suboxone. ID consulted, recommended 8 weeks therapy. Patient left AMA again 4/27, returned within 4 hours to resume treatment.     HPI/Recap of past 24 hours:  No fever, denies pain uneventful night She has been refusing lovenox injection, she states she gets up and walks in room, She agreed to scd's  Assessment/Plan: Principal Problem:   Acute osteomyelitis of cervical spine (HCC) Active Problems:   Bacteremia due to Staphylococcus aureus   Anemia   IV drug user   Hypokalemia   Bacteremia  MSSA bacteremia, previous endocarditis incompletely treated Cervical spine discitis -mri cervical spine "Abnormal prevertebral edema extending from C1-2 inferiorly through C5. " -TEE on 4/22 ,negative for endocarditis  - Cultures negative since 4/16 -Continue cefazolin start date 4/16 -she received one dose of oritavancin on 4/29 -I have discussed with infectious disease Dr hatcher who recommended patient can be discharged on keflex and return to id clinic in two weeks. ID will arrange outpatient dalbavancin. Patient will need total of 6 weeks of treatment, day one from 4/16. -I have discuss with patient , she does not have a same discharge plan, case discussed with case manager and social worker, patient will need to finish abx treatment in the hospital, then go to detox facility    Right knee pain: IMPRESSION: 1. No MR findings  to suggest septic arthritis, osteomyelitis or drainable soft tissue abscess. 2. Mild diffuse cellulitis. 3. No findings for internal derangement.   Opioid use disorder -Continue Suboxone -> taper started by ID as she said she was going to rehab in [place] ( for a 68month program )where she can't be on suboxone -She's on 8 of buprenorphine qAM and 4 qPM.  -case discussed with internal medicine and pharmacy, will try taper suboxone over 30days. -will discuss with Dr Evette Doffing about tapering    Anxiety/depression: -Psychiatry consulted x2 on 4/18 and 4/22, Patient is psychiatrically cleared -Continue Gabapentin 200 mg TID for anxiety. she refused at times -Restart Effexor XR 75 mg daily for mood and anxiety.    Code Status: full  Family Communication: patient   Disposition Plan: not able to safely discharge, will finish abx treatment in the hospital   Consultants:  ID  Cardiology  Psychiatry  Opioids use disorder/withdrawal management consult  Procedures:  As above  Antibiotics:  As above   Objective: BP 101/64 (BP Location: Right Arm)   Pulse 89   Temp 98.6 F (37 C) (Oral)   Resp 18   Ht 5\' 1"  (1.549 m)   Wt 47.6 kg (105 lb)   LMP 04/03/2017   SpO2 98%   BMI 19.84 kg/m   Intake/Output Summary (Last 24 hours) at 10/09/2017 0803 Last data filed at 10/08/2017 1600 Gross per 24 hour  Intake 820 ml  Output -  Net 820 ml   Filed Weights   10/02/17 1737 10/03/17 1455  Weight: 47.6 kg (105 lb) 47.6 kg (105 lb)    Exam: Patient is examined daily  including today on 10/09/2017, exams remain the same as of yesterday except that has changed    General:  NAD  Cardiovascular: RRR  Respiratory: CTABL  Abdomen: Soft/ND/NT, positive BS  Musculoskeletal: No Edema  Neuro: alert, oriented   Data Reviewed: Basic Metabolic Panel: Recent Labs  Lab 10/02/17 2117 10/03/17 0831  NA 136 137  K 3.3* 4.3  CL 105 104  CO2 25 26  GLUCOSE 112* 74  BUN 14 11    CREATININE 0.49 0.49  CALCIUM 8.0* 8.7*  MG  --  1.9   Liver Function Tests: Recent Labs  Lab 10/02/17 2117  AST 23  ALT 17  ALKPHOS 86  BILITOT 0.5  PROT 6.5  ALBUMIN 2.9*   No results for input(s): LIPASE, AMYLASE in the last 168 hours. No results for input(s): AMMONIA in the last 168 hours. CBC: Recent Labs  Lab 10/02/17 2117 10/03/17 0831  WBC 10.4 9.6  NEUTROABS 4.9 5.4  HGB 10.1* 10.8*  HCT 33.3* 35.8*  MCV 79.1 80.3  PLT 496* 513*   Cardiac Enzymes:   No results for input(s): CKTOTAL, CKMB, CKMBINDEX, TROPONINI in the last 168 hours. BNP (last 3 results) No results for input(s): BNP in the last 8760 hours.  ProBNP (last 3 results) No results for input(s): PROBNP in the last 8760 hours.  CBG: No results for input(s): GLUCAP in the last 168 hours.  Recent Results (from the past 240 hour(s))  Urine culture     Status: None   Collection Time: 10/02/17  9:17 PM  Result Value Ref Range Status   Specimen Description URINE, RANDOM  Final   Special Requests NONE  Final   Culture   Final    NO GROWTH Performed at [facility], [address], [place], [place] [address]    Report Status 10/04/2017 FINAL  Final     Studies: No results found.  Scheduled Meds: . buprenorphine-naloxone  1 tablet Sublingual QHS  . buprenorphine-naloxone  1 tablet Sublingual Daily  . enoxaparin (LOVENOX) injection  40 mg Subcutaneous Daily  . gabapentin  200 mg Oral QHS  . gabapentin  400 mg Oral TID WC  . lidocaine  1 patch Transdermal Daily  . venlafaxine XR  75 mg Oral Q breakfast    Continuous Infusions: .  ceFAZolin (ANCEF) IV 2 g (10/09/17 0612)     Time spent: 63mins,  I have personally reviewed and interpreted on  10/09/2017 daily labs, tele strips, imagings as discussed above under date review session and assessment and plans.  I reviewed all nursing notes, pharmacy notes,   vitals, pertinent old records  I have discussed plan of care as described  above with RN , patient  on 10/09/2017   Florencia Reasons MD, PhD  [facility] Pager [phone]. If 7PM-7AM, please contact night-coverage at [email or web address], password [facility] 10/09/2017, 8:03 AM  LOS: 7 days

## 2017-10-10 MED ORDER — NICOTINE 14 MG/24HR TD PT24
14.0000 mg | MEDICATED_PATCH | Freq: Every day | TRANSDERMAL | Status: DC
  Administered 2017-10-10: 14 mg via TRANSDERMAL
  Filled 2017-10-10 (×2): qty 1

## 2017-10-10 NOTE — Progress Notes (Signed)
PROGRESS NOTE  Sherri Francis EAV:[id number] DOB: September 30, 1985 DOA: 10/02/2017 PCP: Patient, No Pcp Per  Brief Narrative:  Sherri Francis is a 32 y.o. 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 She left AMA 4/16, then readmitted 4/17 for resumption of treatment, had TEE 4/22 that showed resolution of her tricuspid vegetation, resumed cefazolin. Started suboxone. ID consulted, recommended 8 weeks therapy. Patient left AMA again 4/27, returned within 4 hours to resume treatment.     HPI/Recap of past 24 hours:  No fever, denies pain uneventful night Her sisters from [place] are here visiting  Assessment/Plan: Principal Problem:   Acute osteomyelitis of cervical spine ([place]) Active Problems:   Bacteremia due to Staphylococcus aureus   Anemia   IV drug user   Hypokalemia   Bacteremia  MSSA bacteremia, previous endocarditis incompletely treated Cervical spine discitis -mri cervical spine "Abnormal prevertebral edema extending from C1-2 inferiorly through C5. " -TEE on 4/22 ,negative for endocarditis  - Cultures negative since 4/16 -Continue cefazolin start date 4/16 -she received one dose of oritavancin on 4/29 -I have discussed with infectious disease Dr hatcher who recommended patient can be discharged on keflex and return to id clinic in two weeks. ID will arrange outpatient dalbavancin. Patient will need total of 6 weeks of treatment, day one from 4/16. -I have discuss with patient , she does not have a same discharge plan, case discussed with case manager and social worker, patient will need to finish abx treatment in the hospital, then go to detox facility    Right knee pain: IMPRESSION: 1. No MR findings to suggest septic arthritis, osteomyelitis  or drainable soft tissue abscess. 2. Mild diffuse cellulitis. 3. No findings for internal derangement.   Opioid use disorder -Continue Suboxone -> taper started by ID as she said she was going to rehab in [place] ( for a 54month program )where she can't be on suboxone -She's on 8 of buprenorphine qAM and 4 qPM.  -case discussed with internal medicine and pharmacy, will try taper suboxone over 30days. -will discuss with Dr Evette Doffing about tapering    Anxiety/depression: -Psychiatry consulted x2 on 4/18 and 4/22, Patient is psychiatrically cleared -Continue Gabapentin 200 mg TID for anxiety. she refused at times -Restart Effexor XR 75 mg daily for mood and anxiety.    Code Status: full  Family Communication: patient   Disposition Plan: not able to safely discharge, will finish abx treatment in the hospital   Consultants:  ID  Cardiology  Psychiatry  Opioids use disorder/withdrawal management consult  Procedures:  As above  Antibiotics:  As above   Objective: BP (!) 100/48 (BP Location: Right Arm)   Pulse 79   Temp 98.2 F (36.8 C) (Oral)   Resp 16   Ht 5\' 1"  (1.549 m)   Wt 47.6 kg (105 lb)   LMP 04/03/2017   SpO2 100%   BMI 19.84 kg/m   Intake/Output Summary (Last 24 hours) at 10/10/2017 0820 Last data filed at 10/09/2017 0900 Gross per 24 hour  Intake 200 ml  Output -  Net 200 ml   Filed Weights   10/02/17 1737 10/03/17 1455  Weight: 47.6 kg (105 lb) 47.6 kg (105 lb)    Exam: Patient is examined daily including today on 10/10/2017, exams remain the same as of  yesterday except that has changed    General:  NAD  Cardiovascular: RRR  Respiratory: CTABL  Abdomen: Soft/ND/NT, positive BS  Musculoskeletal: No Edema  Neuro: alert, oriented   Data Reviewed: Basic Metabolic Panel: Recent Labs  Lab 10/03/17 0831 10/09/17 0857  NA 137 137  K 4.3 4.2  CL 104 103  CO2 26 25  GLUCOSE 74 111*  BUN 11 9  CREATININE 0.49 0.48  CALCIUM 8.7*  8.7*  MG 1.9  --    Liver Function Tests: No results for input(s): AST, ALT, ALKPHOS, BILITOT, PROT, ALBUMIN in the last 168 hours. No results for input(s): LIPASE, AMYLASE in the last 168 hours. No results for input(s): AMMONIA in the last 168 hours. CBC: Recent Labs  Lab 10/03/17 0831 10/09/17 0857  WBC 9.6 7.6  NEUTROABS 5.4  --   HGB 10.8* 10.4*  HCT 35.8* 34.1*  MCV 80.3 80.0  PLT 513* 484*   Cardiac Enzymes:   No results for input(s): CKTOTAL, CKMB, CKMBINDEX, TROPONINI in the last 168 hours. BNP (last 3 results) No results for input(s): BNP in the last 8760 hours.  ProBNP (last 3 results) No results for input(s): PROBNP in the last 8760 hours.  CBG: No results for input(s): GLUCAP in the last 168 hours.  Recent Results (from the past 240 hour(s))  Urine culture     Status: None   Collection Time: 10/02/17  9:17 PM  Result Value Ref Range Status   Specimen Description URINE, RANDOM  Final   Special Requests NONE  Final   Culture   Final    NO GROWTH Performed at [facility], [address], [place], [place] [address]    Report Status 10/04/2017 FINAL  Final     Studies: No results found.  Scheduled Meds: . buprenorphine-naloxone  1 tablet Sublingual QHS  . buprenorphine-naloxone  1 tablet Sublingual Daily  . enoxaparin (LOVENOX) injection  40 mg Subcutaneous Daily  . gabapentin  200 mg Oral QHS  . gabapentin  400 mg Oral TID WC  . lidocaine  1 patch Transdermal Daily  . venlafaxine XR  75 mg Oral Q breakfast    Continuous Infusions: .  ceFAZolin (ANCEF) IV 2 g (10/10/17 0549)     Time spent: 23mins,  I have personally reviewed and interpreted on  10/10/2017 daily labs, tele strips, imagings as discussed above under date review session and assessment and plans.  I reviewed all nursing notes, pharmacy notes,   vitals, pertinent old records  I have discussed plan of care as described above with RN , patient  on 10/10/2017   Florencia Reasons MD,  PhD  [facility] Pager [phone]. If 7PM-7AM, please contact night-coverage at [email or web address], password [facility] 10/10/2017, 8:20 AM  LOS: 8 days

## 2017-10-10 NOTE — Progress Notes (Signed)
Pt requesting a nicotine patch. Dr. Erlinda Hong texted.

## 2017-10-11 MED ORDER — NICOTINE 21 MG/24HR TD PT24
21.0000 mg | MEDICATED_PATCH | Freq: Every day | TRANSDERMAL | Status: DC
  Administered 2017-10-12 – 2017-10-17 (×3): 21 mg via TRANSDERMAL
  Filled 2017-10-11 (×12): qty 1

## 2017-10-11 NOTE — Progress Notes (Signed)
PROGRESS NOTE  Sherri Francis ZTI:[id number] DOB: Nov 29, 1985 DOA: 10/02/2017 PCP: Patient, No Pcp Per  Brief Narrative:  Sherri Francis is a 32 y.o. 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 She left AMA 4/16, then readmitted 4/17 for resumption of treatment, had TEE 4/22 that showed resolution of her tricuspid vegetation, resumed cefazolin. Started suboxone. ID consulted, recommended 8 weeks therapy. Patient left AMA again 4/27, returned within 4 hours to resume treatment.     HPI/Recap of past 24 hours:  No fever, denies pain uneventful night Mother at bedside  Assessment/Plan: Principal Problem:   Acute osteomyelitis of cervical spine (HCC) Active Problems:   Bacteremia due to Staphylococcus aureus   Anemia   IV drug user   Hypokalemia   Bacteremia  MSSA bacteremia, previous endocarditis incompletely treated Cervical spine discitis -mri cervical spine "Abnormal prevertebral edema extending from C1-2 inferiorly through C5. " -TEE on 4/22 ,negative for endocarditis  - Cultures negative since 4/16 -Continue cefazolin start date 4/16 -she received one dose of oritavancin on 4/29 -I have discussed with infectious disease Dr hatcher who recommended patient can be discharged on keflex and return to id clinic in two weeks. ID will arrange outpatient dalbavancin. Patient will need total of 6 weeks of treatment, day one from 4/16. -I have discuss with patient , she does not have a same discharge plan, case discussed with case manager and social worker, patient will need to finish abx treatment in the hospital, then go to detox facility    Right knee pain: IMPRESSION: 1. No MR findings to suggest septic arthritis, osteomyelitis or drainable soft tissue abscess. 2. Mild  diffuse cellulitis. 3. No findings for internal derangement.   Opioid use disorder -Continue Suboxone -> taper started by ID as she said she was going to rehab in [place] ( for a 101month program )where she can't be on suboxone -She's on 8 of buprenorphine qAM and 4 qPM.  -case discussed with internal medicine and pharmacy, will try taper suboxone over 30days. -will discuss with Dr Evette Doffing about tapering    Anxiety/depression: -Psychiatry consulted x2 on 4/18 and 4/22, Patient is psychiatrically cleared -Continue Gabapentin 200 mg TID for anxiety. she refused at times -Restart Effexor XR 75 mg daily for mood and anxiety.  Cigarette smoking: smoking cessation education provided >68mins, nicotine patch  Code Status: full  Family Communication: patient and mother at bedside  Disposition Plan: not able to safely discharge, will finish abx treatment in the hospital   Consultants:  ID  Cardiology  Psychiatry  Opioids use disorder/withdrawal management consult  Procedures:  As above  Antibiotics:  As above   Objective: BP (!) 100/58 (BP Location: Right Arm)   Pulse 80   Temp 98.2 F (36.8 C) (Oral)   Resp 16   Ht 5\' 1"  (1.549 m)   Wt 47.6 kg (105 lb)   LMP 04/03/2017   SpO2 99%   BMI 19.84 kg/m   Intake/Output Summary (Last 24 hours) at 10/11/2017 0739 Last data filed at 10/10/2017 0907 Gross per 24 hour  Intake 0 ml  Output -  Net 0 ml   Filed Weights   10/02/17 1737 10/03/17 1455  Weight: 47.6 kg (105 lb) 47.6 kg (105 lb)    Exam: Patient is examined daily including today on 10/11/2017,  exams remain the same as of yesterday except that has changed    General:  NAD  Cardiovascular: RRR  Respiratory: CTABL  Abdomen: Soft/ND/NT, positive BS  Musculoskeletal: No Edema  Neuro: alert, oriented   Data Reviewed: Basic Metabolic Panel: Recent Labs  Lab 10/09/17 0857  NA 137  K 4.2  CL 103  CO2 25  GLUCOSE 111*  BUN 9  CREATININE 0.48    CALCIUM 8.7*   Liver Function Tests: No results for input(s): AST, ALT, ALKPHOS, BILITOT, PROT, ALBUMIN in the last 168 hours. No results for input(s): LIPASE, AMYLASE in the last 168 hours. No results for input(s): AMMONIA in the last 168 hours. CBC: Recent Labs  Lab 10/09/17 0857  WBC 7.6  HGB 10.4*  HCT 34.1*  MCV 80.0  PLT 484*   Cardiac Enzymes:   No results for input(s): CKTOTAL, CKMB, CKMBINDEX, TROPONINI in the last 168 hours. BNP (last 3 results) No results for input(s): BNP in the last 8760 hours.  ProBNP (last 3 results) No results for input(s): PROBNP in the last 8760 hours.  CBG: No results for input(s): GLUCAP in the last 168 hours.  Recent Results (from the past 240 hour(s))  Urine culture     Status: None   Collection Time: 10/02/17  9:17 PM  Result Value Ref Range Status   Specimen Description URINE, RANDOM  Final   Special Requests NONE  Final   Culture   Final    NO GROWTH Performed at [facility], [address] [place], [place] [address]    Report Status 10/04/2017 FINAL  Final     Studies: No results found.  Scheduled Meds: . buprenorphine-naloxone  1 tablet Sublingual QHS  . buprenorphine-naloxone  1 tablet Sublingual Daily  . enoxaparin (LOVENOX) injection  40 mg Subcutaneous Daily  . gabapentin  200 mg Oral QHS  . gabapentin  400 mg Oral TID WC  . lidocaine  1 patch Transdermal Daily  . nicotine  14 mg Transdermal Daily  . venlafaxine XR  75 mg Oral Q breakfast    Continuous Infusions: .  ceFAZolin (ANCEF) IV Stopped (10/10/17 2206)     Time spent: 1mins,  I have personally reviewed and interpreted on  10/11/2017 daily labs, tele strips, imagings as discussed above under date review session and assessment and plans.  I reviewed all nursing notes, pharmacy notes,   vitals, pertinent old records  I have discussed plan of care as described above with RN , patient and mother  on 10/11/2017   Florencia Reasons MD, PhD  [facility] Pager [phone]. If 7PM-7AM, please contact night-coverage at [email or web address], password [facility] 10/11/2017, 7:39 AM  LOS: 9 days

## 2017-10-12 MED ORDER — PROPRANOLOL HCL 10 MG PO TABS
10.0000 mg | ORAL_TABLET | Freq: Two times a day (BID) | ORAL | Status: DC
  Administered 2017-10-12 – 2017-10-27 (×29): 10 mg via ORAL
  Filled 2017-10-12 (×34): qty 1

## 2017-10-12 NOTE — Progress Notes (Signed)
MEDICATION RELATED CONSULT NOTE - FOLLOW UP   Pharmacy Consult:  Suboxone tapering  No Known Allergies  Patient Measurements: Height: 5\' 1"  (154.9 cm) Weight: 105 lb (47.6 kg) IBW/kg (Calculated) : 47.8  Vital Signs: Temp: 98.4 F (36.9 C) (05/07 1406) Temp Source: Oral (05/07 1406) BP: 138/93 (05/07 1406) Pulse Rate: 125 (05/07 1406)   Assessment: 20 YOF with history of IVDU, hepatitis C and MSSA bacteremia/endocarditis to undergo Suboxone taper so she could be admitted into Rehab.  Suboxone requires slow tapering to avoid withdrawal and current plan is to taper over 30 days, reducing by increments of 2mg  every 3-5 days.  Suboxone (buprenorphine) was reduced from 12mg  to 10mg  daily on 10/08/16.  Patient reported that she has had 3-4 anxiety attacks in the past couple of days.  Upon further inquiry, patient stated that they were more related to her social/family issues, legal issues and stress from hospitalization than from the Suboxone tapering.  She stated that she had withdrawal in the past from stopping Suboxone abruptly, and none of the present symptoms are similar to the previous symptoms.  Patient thinks she could tolerate tapering Suboxone further today and is willing to give it a try.  Relay plan and patient's response to Dr. Erlinda Hong.  Patient reported that Xanax has helped her anxiety in the past, but Dr. Erlinda Hong and I are hesitant to start patient on a BZD.  PRN hydroxyzine available and restart propranolol for her anxiety.     Plan:  Reduce Suboxone to 8-2mg  SL once daily (D/C PM dose) Monitor for withdrawal.  If negative, reduce by 2mg  over the weekend. Propranolol 10mg  PO BID + PRN hydroxyzine for anxiety When Dr. Evette Doffing returns from his vacation next week, will consult him for recommendation    Rami Waddle D. Mina Marble, PharmD, BCPS, [place] Pager:  [phone] 10/12/2017, 2:49 PM

## 2017-10-12 NOTE — Progress Notes (Signed)
PROGRESS NOTE  Sherri Francis HYQ:[id number] DOB: 1985-10-17 DOA: 10/02/2017 PCP: Patient, No Pcp Per  Brief Narrative:  Sherri Francis is a 32 y.o. 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 She left AMA 4/16, then readmitted 4/17 for resumption of treatment, had TEE 4/22 that showed resolution of her tricuspid vegetation, resumed cefazolin. Started suboxone. ID consulted, recommended 8 weeks therapy. Patient left AMA again 4/27, returned within 4 hours to resume treatment.     HPI/Recap of past 24 hours:  No fever, denies pain uneventful night No new complaints  She did not reports new symptom to me this am, she reports anxiety to pharmacist later today   Assessment/Plan: Principal Problem:   Acute osteomyelitis of cervical spine (HCC) Active Problems:   Bacteremia due to Staphylococcus aureus   Anemia   IV drug user   Hypokalemia   Bacteremia  MSSA bacteremia, previous endocarditis incompletely treated Cervical spine discitis -mri cervical spine "Abnormal prevertebral edema extending from C1-2 inferiorly through C5. " -TEE on 4/22 ,negative for endocarditis  - Cultures negative since 4/16 -Continue cefazolin start date 4/16 -she received one dose of oritavancin on 4/29 -I have discussed with infectious disease Dr hatcher who recommended patient can be discharged on keflex and return to id clinic in two weeks. ID will arrange outpatient dalbavancin. Patient will need total of 6 weeks of treatment, day one from 4/16. -I have discuss with patient , she does not have a same discharge plan, case discussed with case manager and social worker, patient will need to finish abx treatment in the hospital, then go to detox facility    Right knee pain: IMPRESSION: 1. No  MR findings to suggest septic arthritis, osteomyelitis or drainable soft tissue abscess. 2. Mild diffuse cellulitis. 3. No findings for internal derangement.   Opioid use disorder -she is started on Suboxone since admission -  taper started by ID as she said she was going to rehab in [place] ( for a 108month program )where she can't be on suboxone --case discussed with internal medicine Dr Rayann Heman and pharmacy on 5/5 , will try taper suboxone over 30days. -Dr Corrinne Eagle on 4/17, he is out of office this week,  Can further discuss taper once Dr Evette Doffing returns.   Anxiety/depression: -Psychiatry consulted x2 on 4/18 and 4/22, Patient is psychiatrically cleared -Continue Gabapentin 200 mg TID for anxiety and prn hydroxyzine.   -Restart Effexor XR 75 mg daily for mood and anxiety. -she is started on low dose propranolol for anxiety as well.  Cigarette smoking: smoking cessation education provided >45mins, nicotine patch  Difficult social situation, discharge plan still not certain.  Patient and family  is aware that abx treatment will be done at the end of this month, she is encouraged to work on drug rehab placement and get her plan finalized some time soon.  Per her and her family she can not be out in the community because of fear of relapse of drug use. Their plan for now is to go directly from the hospital to drug rehab program of their choice.  I have discussed the case with social worker and Banker as well.  Code Status: full  Family Communication: patient and mother at bedside on 5/6  Disposition Plan: not able to safely discharge, will  finish abx treatment in the hospital   Consultants:  ID  Cardiology  Psychiatry  Opioids use disorder/withdrawal management consult  Procedures:  As above  Antibiotics:  As above   Objective: BP (!) 135/97 (BP Location: Right Arm)   Pulse (!) 108   Temp 98.4 F (36.9 C) (Oral)   Resp 18   Ht 5\' 1"  (1.549 m)    Wt 47.6 kg (105 lb)   LMP 04/03/2017   SpO2 100%   BMI 19.84 kg/m   Intake/Output Summary (Last 24 hours) at 10/12/2017 1003 Last data filed at 10/11/2017 2205 Gross per 24 hour  Intake 800 ml  Output 1 ml  Net 799 ml   Filed Weights   10/02/17 1737 10/03/17 1455  Weight: 47.6 kg (105 lb) 47.6 kg (105 lb)    Exam: Patient is examined daily including today on 10/12/2017, exams remain the same as of yesterday except that has changed    General:  NAD  Cardiovascular: RRR  Respiratory: CTABL  Abdomen: Soft/ND/NT, positive BS  Musculoskeletal: No Edema  Neuro: alert, oriented   Data Reviewed: Basic Metabolic Panel: Recent Labs  Lab 10/09/17 0857  NA 137  K 4.2  CL 103  CO2 25  GLUCOSE 111*  BUN 9  CREATININE 0.48  CALCIUM 8.7*   Liver Function Tests: No results for input(s): AST, ALT, ALKPHOS, BILITOT, PROT, ALBUMIN in the last 168 hours. No results for input(s): LIPASE, AMYLASE in the last 168 hours. No results for input(s): AMMONIA in the last 168 hours. CBC: Recent Labs  Lab 10/09/17 0857  WBC 7.6  HGB 10.4*  HCT 34.1*  MCV 80.0  PLT 484*   Cardiac Enzymes:   No results for input(s): CKTOTAL, CKMB, CKMBINDEX, TROPONINI in the last 168 hours. BNP (last 3 results) No results for input(s): BNP in the last 8760 hours.  ProBNP (last 3 results) No results for input(s): PROBNP in the last 8760 hours.  CBG: No results for input(s): GLUCAP in the last 168 hours.  Recent Results (from the past 240 hour(s))  Urine culture     Status: None   Collection Time: 10/02/17  9:17 PM  Result Value Ref Range Status   Specimen Description URINE, RANDOM  Final   Special Requests NONE  Final   Culture   Final    NO GROWTH Performed at [facility], [address], [place], [place] [address]    Report Status 10/04/2017 FINAL  Final     Studies: No results found.  Scheduled Meds: . buprenorphine-naloxone  1 tablet Sublingual QHS  . buprenorphine-naloxone   1 tablet Sublingual Daily  . enoxaparin (LOVENOX) injection  40 mg Subcutaneous Daily  . gabapentin  200 mg Oral QHS  . gabapentin  400 mg Oral TID WC  . lidocaine  1 patch Transdermal Daily  . nicotine  21 mg Transdermal Daily  . venlafaxine XR  75 mg Oral Q breakfast    Continuous Infusions: .  ceFAZolin (ANCEF) IV Stopped (10/12/17 0630)     Time spent: 60mins,  I have personally reviewed and interpreted on  10/12/2017 daily labs, tele strips, imagings as discussed above under date review session and assessment and plans.  I reviewed all nursing notes, pharmacy notes,   vitals, pertinent old records  I have discussed plan of care as described above with RN , patient  on 10/12/2017   Florencia Reasons MD, PhD  [facility] Pager [phone]. If 7PM-7AM, please contact night-coverage at [email or web address],  password [email or web address] 10/12/2017, 10:03 AM  LOS: 10 days

## 2017-10-13 MED ORDER — BUSPIRONE HCL 5 MG PO TABS
5.0000 mg | ORAL_TABLET | Freq: Two times a day (BID) | ORAL | Status: DC | PRN
  Filled 2017-10-13: qty 1

## 2017-10-13 MED ORDER — CYCLOBENZAPRINE HCL 5 MG PO TABS
5.0000 mg | ORAL_TABLET | Freq: Three times a day (TID) | ORAL | Status: DC | PRN
  Administered 2017-10-13 – 2017-10-27 (×5): 5 mg via ORAL
  Filled 2017-10-13 (×6): qty 1

## 2017-10-13 MED ORDER — BUSPIRONE HCL 5 MG PO TABS
5.0000 mg | ORAL_TABLET | Freq: Two times a day (BID) | ORAL | Status: DC
  Filled 2017-10-13: qty 1

## 2017-10-13 NOTE — Progress Notes (Signed)
PT Cancellation Note/Discharge  Patient Details Name: Lue Sykora MRN: [id number] DOB: Oct 31, 1985   Cancelled Treatment:    Reason Eval/Treat Not Completed: PT screened, no needs identified, will sign off .  Pt reports she is an independent Ambulator.  She is managing her neck pain with voltaren gel and has no acute therapy needs at this time.    PT to sign off.   Barbarann Ehlers [place], [place], DPT [phone]   10/13/2017, 5:04 PM

## 2017-10-13 NOTE — Progress Notes (Signed)
PROGRESS NOTE  Sherri Francis [id number] DOB: [birth year] DOA: 10/02/2017 PCP: Patient, No Pcp Per  Brief Narrative:  Mrs. Sherri Francis is a 32 y.o. 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 She left AMA 4/16, then readmitted 4/17 for resumption of treatment, had TEE 4/22 that showed resolution of her tricuspid vegetation, resumed cefazolin. Started suboxone. ID consulted, recommended 8 weeks therapy. Patient left AMA again 4/27, returned within 4 hours to resume treatment.    Assessment/Plan: Principal Problem:   Acute osteomyelitis of cervical spine (HCC) Active Problems:   Bacteremia due to Staphylococcus aureus   Anemia   IV drug user   Hypokalemia   Bacteremia  MSSA bacteremia, previous endocarditis incompletely treated Cervical spine discitis -mri cervical spine "Abnormal prevertebral edema extending from C1-2 inferiorly through C5. " -TEE on 4/22 ,negative for endocarditis  - Cultures negative since 4/16 -Continue cefazolin start date 4/16 -she received one dose of oritavancin on 4/29 -I have discussed with infectious disease Dr hatcher who recommended patient can be discharged on keflex and return to id clinic in two weeks. ID will arrange outpatient dalbavancin. Patient will need total of 6 weeks of treatment, day one from 4/16. -I have discussed with patient , she does not have a safe discharge plan, will re-discusscase discussed with case manager and social worker - current plan, patient will need to finish abx treatment in the hospital, then go to detox facility    Right knee pain: IMPRESSION: 1. No MR findings to suggest septic arthritis, osteomyelitis or drainable soft tissue abscess. 2. Mild diffuse cellulitis. 3. No findings for internal  derangement. Doing well today. Pt has been ambulatory.   Opioid use disorder -she is started on Suboxone since admission -  taper started by ID as she said she was going to rehab in [place] ( for a 55month program )where she can't be on suboxone --case discussed with internal medicine Dr Rayann Heman and pharmacy on 5/5 , will try taper suboxone over 30days. -Dr Corrinne Eagle on 4/17, he is out of office this week,  Can further discuss taper once Dr Evette Doffing returns.  Neck pain PT eval  Prn flexeril  Anxiety/depression: - Psychiatry consulted x2 on 4/18 and 4/22, Patient is psychiatrically cleared - Continue Gabapentin 200 mg TID for anxiety and prn hydroxyzine.   - Cont Effexor XR 75 mg daily for mood and anxiety. - she is started on low dose propranolol for anxiety as well. - buspar prn bid  Cigarette smoking: smoking cessation education provided >76mins previosuly, nicotine patch  Difficult social situation, discharge plan still not certain.  Patient and family  is aware that abx treatment will be done at the end of this month, she is encouraged to work on drug rehab placement and get her plan finalized some time soon.  Per her and her family she can not be out in the community because of fear of relapse of drug use. Their plan for now is to go directly from the hospital to drug rehab program of their choice. Previously case was discussed with social worker and Banker as well by Dr. Erlinda Hong  Code Status: full  Family Communication: patient and mother at bedside on 5/6  Disposition Plan: not able to safely discharge, will finish abx treatment in the hospital  Consultants:  ID  Cardiology  Psychiatry  Opioids use disorder/withdrawal management consult  Procedures:  As above  Antibiotics:  As above  S: C/o anxiety. States benadryl derivatives don't work. C/o of mild neck discomfort with turning head that is fleeting.  Objective: BP 92/76 (BP Location: Left Arm)    Pulse 90   Temp 98.5 F (36.9 C) (Oral)   Resp 16   Ht 5\' 1"  (1.549 m)   Wt 47.6 kg (105 lb)   LMP 04/03/2017   SpO2 98%   BMI 19.84 kg/m   Intake/Output Summary (Last 24 hours) at 10/13/2017 1326 Last data filed at 10/13/2017 0436 Gross per 24 hour  Intake 560 ml  Output -  Net 560 ml   Filed Weights   10/02/17 1737 10/03/17 1455  Weight: 47.6 kg (105 lb) 47.6 kg (105 lb)    Exam: Patient is examined daily including today on 10/13/2017, exams remain the same as of yesterday except that has changed    General:  NAD  Cardiovascular: RRR  Respiratory: CTABL  Abdomen: Soft/ND/NT, positive BS  Musculoskeletal: No Edema  Neuro: alert, oriented   Data Reviewed: Basic Metabolic Panel: Recent Labs  Lab 10/09/17 0857  NA 137  K 4.2  CL 103  CO2 25  GLUCOSE 111*  BUN 9  CREATININE 0.48  CALCIUM 8.7*   Liver Function Tests: No results for input(s): AST, ALT, ALKPHOS, BILITOT, PROT, ALBUMIN in the last 168 hours. No results for input(s): LIPASE, AMYLASE in the last 168 hours. No results for input(s): AMMONIA in the last 168 hours. CBC: Recent Labs  Lab 10/09/17 0857  WBC 7.6  HGB 10.4*  HCT 34.1*  MCV 80.0  PLT 484*   Cardiac Enzymes:   No results for input(s): CKTOTAL, CKMB, CKMBINDEX, TROPONINI in the last 168 hours. BNP (last 3 results) No results for input(s): BNP in the last 8760 hours.  ProBNP (last 3 results) No results for input(s): PROBNP in the last 8760 hours.  CBG: No results for input(s): GLUCAP in the last 168 hours.  No results found for this or any previous visit (from the past 240 hour(s)).   Studies: No results found.  Scheduled Meds: . buprenorphine-naloxone  1 tablet Sublingual Daily  . enoxaparin (LOVENOX) injection  40 mg Subcutaneous Daily  . gabapentin  200 mg Oral QHS  . gabapentin  400 mg Oral TID WC  . lidocaine  1 patch Transdermal Daily  . nicotine  21 mg Transdermal Daily  . propranolol  10 mg Oral BID  .  venlafaxine XR  75 mg Oral Q breakfast    Continuous Infusions: .  ceFAZolin (ANCEF) IV 2 g (10/13/17 1316)     Time spent: 76mins,   Elwin Mocha MD  [facility] Pager AMION. If 7PM-7AM, please contact night-coverage at [email or web address], password [facility] 10/13/2017, 1:26 PM  LOS: 11 days

## 2017-10-14 ENCOUNTER — Encounter (HOSPITAL_COMMUNITY): Payer: Self-pay

## 2017-10-14 DIAGNOSIS — F419 Anxiety disorder, unspecified: Secondary | ICD-10-CM

## 2017-10-14 DIAGNOSIS — M542 Cervicalgia: Secondary | ICD-10-CM

## 2017-10-14 NOTE — Progress Notes (Signed)
PROGRESS NOTE  Sherri Francis [id number] DOB: 1986-03-01 DOA: 10/02/2017 PCP: Patient, No Pcp Per  Brief Narrative:  Sherri Francis is a 32 y.o. 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 She left AMA 4/16, then readmitted 4/17 for resumption of treatment, had TEE 4/22 that showed resolution of her tricuspid vegetation, resumed cefazolin. Started suboxone. ID consulted, recommended 8 weeks therapy. Patient left AMA again 4/27, returned within 4 hours to resume treatment.    Assessment/Plan: Principal Problem:   Acute osteomyelitis of cervical spine (HCC) Active Problems:   Bacteremia due to Staphylococcus aureus   Anemia   IV drug user   Hypokalemia   Bacteremia  MSSA bacteremia, previous endocarditis incompletely treated Cervical spine discitis -mri cervical spine "Abnormal prevertebral edema extending from C1-2 inferiorly through C5. " -TEE on 4/22 ,negative for endocarditis  - Cultures negative since 4/16 -Continue cefazolin start date 4/16 -she received one dose of oritavancin on 4/29 -Dr. Erlinda Hong had discussed with infectious disease Dr hatcher who recommended patient can be discharged on keflex and return to id clinic in two weeks. ID will arrange outpatient dalbavancin. Patient will need total of 6 weeks of treatment, day one from 4/16. -Dr. Erlinda Hong had discussed with patient , she does not have a safe discharge plan, pt will remain inpt until IV abx complete and then go to detox facility - pt will get cefazolin and dalbavancin, during hospital stay    Right knee pain: IMPRESSION: 1. No MR findings to suggest septic arthritis, osteomyelitis or drainable soft tissue abscess. 2. Mild diffuse cellulitis. 3. No findings for internal derangement. Doing well again  today. Pt has been ambulatory.   Opioid use disorder -she is started on Suboxone since admission -  taper started by ID as she said she was going to rehab in [place] ( for a 33month program )where she can't be on suboxone --case discussed with internal medicine Dr Rayann Heman and pharmacy on 5/5 , will try taper suboxone over 30days. -Dr Corrinne Eagle on 4/17, he is out of office this week,  Can further discuss taper once Dr Evette Doffing returns.  Neck pain PT eval  Prn flexeril  Anxiety/depression: - Psychiatry consulted x2 on 4/18 and 4/22, Patient is psychiatrically cleared - Continue Gabapentin 200 mg TID for anxiety and prn hydroxyzine.   - Cont Effexor XR 75 mg daily for mood and anxiety. - she is started on low dose propranolol for anxiety as well. - buspar prn bid  Cigarette smoking: smoking cessation education provided >23mins previosuly, nicotine patch  Difficult social situation, discharge plan still not certain.  Patient and family  is aware that abx treatment will be done at the end of this month, she is encouraged to work on drug rehab placement and get her plan finalized some time soon.  Per her and her family she can not be out in the community because of fear of relapse of drug use. Their plan for now is to go directly from the hospital to drug rehab program of their choice. Previously case was discussed with social worker and Banker as well by Dr. Erlinda Hong  Code Status: full  Family Communication: patient and mother at bedside on 5/6  Disposition Plan: not able to safely discharge, will finish abx treatment in the hospital  Consultants:  ID  Cardiology  Psychiatry  Opioids use disorder/withdrawal management consult  Procedures:  As above  Antibiotics:  As above  S: No complaints today. Objective: BP 105/60 (BP Location: Right Arm)   Pulse 68   Temp 98.5 F (36.9 C) (Oral)   Resp 16   Ht 5\' 1"  (1.549 m)   Wt 47.6 kg (105 lb)   LMP 04/03/2017  Comment: Pt states LMP 10/18. Negative preg test 10/02/17  SpO2 100%   BMI 19.84 kg/m   Intake/Output Summary (Last 24 hours) at 10/14/2017 1709 Last data filed at 10/14/2017 0858 Gross per 24 hour  Intake 260 ml  Output -  Net 260 ml   Filed Weights   10/02/17 1737 10/03/17 1455  Weight: 47.6 kg (105 lb) 47.6 kg (105 lb)    Exam: Patient is examined daily including today on 10/14/2017, exams remain the same as of yesterday except that has changed    General:  NAD, A&Ox3  Cardiovascular: RRR  Respiratory: CTAB, nl wob  Abdomen: Soft/ND/NT, positive BS  Musculoskeletal: No Edema  Neuro: alert, oriented   Data Reviewed: Basic Metabolic Panel: Recent Labs  Lab 10/09/17 0857  NA 137  K 4.2  CL 103  CO2 25  GLUCOSE 111*  BUN 9  CREATININE 0.48  CALCIUM 8.7*   Liver Function Tests: No results for input(s): AST, ALT, ALKPHOS, BILITOT, PROT, ALBUMIN in the last 168 hours. No results for input(s): LIPASE, AMYLASE in the last 168 hours. No results for input(s): AMMONIA in the last 168 hours. CBC: Recent Labs  Lab 10/09/17 0857  WBC 7.6  HGB 10.4*  HCT 34.1*  MCV 80.0  PLT 484*   Cardiac Enzymes:   No results for input(s): CKTOTAL, CKMB, CKMBINDEX, TROPONINI in the last 168 hours. BNP (last 3 results) No results for input(s): BNP in the last 8760 hours.  ProBNP (last 3 results) No results for input(s): PROBNP in the last 8760 hours.  CBG: No results for input(s): GLUCAP in the last 168 hours.  No results found for this or any previous visit (from the past 240 hour(s)).   Studies: No results found.  Scheduled Meds: . buprenorphine-naloxone  1 tablet Sublingual Daily  . enoxaparin (LOVENOX) injection  40 mg Subcutaneous Daily  . gabapentin  200 mg Oral QHS  . gabapentin  400 mg Oral TID WC  . lidocaine  1 patch Transdermal Daily  . nicotine  21 mg Transdermal Daily  . propranolol  10 mg Oral BID  . venlafaxine XR  75 mg Oral Q breakfast     Continuous Infusions: .  ceFAZolin (ANCEF) IV Stopped (10/14/17 1418)     Time spent: 20 mins   Elwin Mocha MD  [facility] Pager AMION. If 7PM-7AM, please contact night-coverage at [email or web address], password [facility] 10/14/2017, 5:09 PM  LOS: 12 days

## 2017-10-15 NOTE — Progress Notes (Signed)
PROGRESS NOTE  Sherri Francis TML:[id number] DOB: [birth year] DOA: 10/02/2017 PCP: Patient, No Pcp Per  Brief Narrative:  Sherri Francis is a 32 y.o. 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 She left AMA 4/16, then readmitted 4/17 for resumption of treatment, had TEE 4/22 that showed resolution of her tricuspid vegetation, resumed cefazolin. Started suboxone. ID consulted, recommended 6 weeks therapy. Patient left AMA again 4/27, returned within 4 hours to resume treatment.    Assessment/Plan: Principal Problem:   Acute osteomyelitis of cervical spine (HCC) Active Problems:   Bacteremia due to Staphylococcus aureus   Anemia   IV drug user   Hypokalemia   Bacteremia  MSSA bacteremia, previous endocarditis incompletely treated Cervical spine discitis (abx day 24)  -mri cervical spine "Abnormal prevertebral edema extending from C1-2 inferiorly through C5. " -TEE on 4/22 ,negative for endocarditis  - Cultures negative since 4/16 -Continue cefazolin start date 4/16 -she received one dose of oritavancin on 4/29, next doses on 13th and 27th and then discharge -PREVIOUS PLAN --Dr. Erlinda Hong had discussed with infectious disease Dr hatcher who recommended patient can be discharged on keflex and return to id clinic in two weeks. ID will arrange outpatient dalbavancin. Patient will need total of 6 weeks of treatment, day one from 4/16. -REVISED PALN --Dr. Erlinda Hong had discussed with patient , she does not have a safe discharge plan, pt will remain inpt until IV abx complete and then go to detox facility; pt will get cefazolin and dalbavancin, during hospital stay    Right knee pain: IMPRESSION: 1. No MR findings to suggest septic arthritis, osteomyelitis or drainable soft tissue abscess. 2.  Mild diffuse cellulitis. 3. No findings for internal derangement. Doing well again today. Pt has been ambulatory.   Opioid use disorder -cont Suboxone -  taper started by ID as she said she was going to rehab in [place] ( for a 39month program )where she can't be on suboxone --case discussed with internal medicine Dr Rayann Heman and pharmacy on 5/5 , will try taper suboxone over 30days. -Dr Corrinne Eagle on 4/17, he is out of office this week,  Can further discuss taper once Dr Evette Doffing returns.  Neck pain PT eval  Prn flexeril  Anxiety/depression: - Psychiatry consulted x2 on 4/18 and 4/22, Patient is psychiatrically cleared - Continue Gabapentin 200 mg TID for anxiety and prn hydroxyzine.   - Cont Effexor XR 75 mg daily for mood and anxiety. - cont on low dose propranolol for anxiety as well; started this admit - buspar prn bid, started this admit  Cigarette smoking: smoking cessation education provided >69mins previosuly, nicotine patch  Difficult social situation, discharge plan still not certain.  Patient and family  is aware that abx treatment will be done at the end of this month, she is encouraged to work on drug rehab placement and get her plan finalized some time soon.  Per her and her family she can not be out in the community because of fear of relapse of drug use. Their plan for now is to go directly from the hospital to drug rehab program of their choice. Previously case was discussed with social worker and Banker as well by Dr. Erlinda Hong  Code Status: full  Family Communication: patient and mother at bedside on 5/6  Disposition  Plan: not able to safely discharge, will finish abx treatment in the hospital   Consultants:  ID  Cardiology  Psychiatry  Opioids use disorder/withdrawal management consult  Procedures:  As above  Antibiotics:  As above  S: No complaints again today. Neck feels good. Objective: BP 122/70 (BP Location: Right Arm)   Pulse 82    Temp 99.2 F (37.3 C) (Oral)   Resp 14   Ht 5\' 1"  (1.549 m)   Wt 47.6 kg (105 lb)   LMP 04/03/2017 Comment: Pt states LMP 10/18. Negative preg test 10/02/17  SpO2 99%   BMI 19.84 kg/m   Intake/Output Summary (Last 24 hours) at 10/15/2017 1617 Last data filed at 10/15/2017 1500 Gross per 24 hour  Intake 240 ml  Output -  Net 240 ml   Filed Weights   10/02/17 1737 10/03/17 1455  Weight: 47.6 kg (105 lb) 47.6 kg (105 lb)    Exam: Patient is examined daily including today on 10/15/2017, exams remain the same as of yesterday except that has changed    General:  NAD, A&Ox3  Cardiovascular: RRR  Respiratory: CTAB, nl wob  Abdomen: Soft/ND/NT, positive BS  Musculoskeletal: No Edema  Neuro: alert, oriented   Data Reviewed: Basic Metabolic Panel: Recent Labs  Lab 10/09/17 0857  NA 137  K 4.2  CL 103  CO2 25  GLUCOSE 111*  BUN 9  CREATININE 0.48  CALCIUM 8.7*   Liver Function Tests: No results for input(s): AST, ALT, ALKPHOS, BILITOT, PROT, ALBUMIN in the last 168 hours. No results for input(s): LIPASE, AMYLASE in the last 168 hours. No results for input(s): AMMONIA in the last 168 hours. CBC: Recent Labs  Lab 10/09/17 0857  WBC 7.6  HGB 10.4*  HCT 34.1*  MCV 80.0  PLT 484*   Cardiac Enzymes:   No results for input(s): CKTOTAL, CKMB, CKMBINDEX, TROPONINI in the last 168 hours. BNP (last 3 results) No results for input(s): BNP in the last 8760 hours.  ProBNP (last 3 results) No results for input(s): PROBNP in the last 8760 hours.  CBG: No results for input(s): GLUCAP in the last 168 hours.  No results found for this or any previous visit (from the past 240 hour(s)).   Studies: No results found.  Scheduled Meds: . buprenorphine-naloxone  1 tablet Sublingual Daily  . enoxaparin (LOVENOX) injection  40 mg Subcutaneous Daily  . gabapentin  200 mg Oral QHS  . gabapentin  400 mg Oral TID WC  . lidocaine  1 patch Transdermal Daily  . nicotine  21 mg  Transdermal Daily  . propranolol  10 mg Oral BID  . venlafaxine XR  75 mg Oral Q breakfast    Continuous Infusions: .  ceFAZolin (ANCEF) IV Stopped (10/15/17 1421)     Time spent: 20 mins   Elwin Mocha MD  [facility] Pager AMION. If 7PM-7AM, please contact night-coverage at [email or web address], password [facility] 10/15/2017, 4:17 PM  LOS: 13 days

## 2017-10-16 LAB — BASIC METABOLIC PANEL
ANION GAP: 8 (ref 5–15)
BUN: 13 mg/dL (ref 6–20)
CALCIUM: 8.8 mg/dL — AB (ref 8.9–10.3)
CO2: 26 mmol/L (ref 22–32)
Chloride: 105 mmol/L (ref 101–111)
Creatinine, Ser: 0.51 mg/dL (ref 0.44–1.00)
GFR calc non Af Amer: >60 mL/min (ref >60)
GLUCOSE: 101 mg/dL — AB (ref 65–99)
Potassium: 3.3 mmol/L — ABNORMAL LOW (ref 3.5–5.1)
Sodium: 139 mmol/L (ref 135–145)

## 2017-10-16 LAB — CBC
HEMATOCRIT: 33 % — AB (ref 36.0–46.0)
HEMOGLOBIN: 9.9 g/dL — AB (ref 12.0–15.0)
MCH: 23.7 pg — AB (ref 26.0–34.0)
MCHC: 30 g/dL (ref 30.0–36.0)
MCV: 78.9 fL (ref 78.0–100.0)
Platelets: 433 10*3/uL — ABNORMAL HIGH (ref 150–400)
RBC: 4.18 MIL/uL (ref 3.87–5.11)
RDW: 16.8 % — ABNORMAL HIGH (ref 11.5–15.5)
WBC: 7.9 10*3/uL (ref 4.0–10.5)

## 2017-10-16 NOTE — Progress Notes (Signed)
PROGRESS NOTE  Sherri Francis IRW:[id number] DOB: 04-09-1986 DOA: 10/02/2017 PCP: Patient, No Pcp Per   LOS: 14 days   Brief Narrative / Interim history: Sherri Francis is a 32 y.o. 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 She left AMA 4/16, then readmitted 4/17 for resumption of treatment, had TEE 4/22 that showed resolution of her tricuspid vegetation, resumed cefazolin. Started suboxone. ID consulted, recommended 6 weeks therapy. Patient left AMA again 4/27, returned within 4 hours to resume treatment.  Assessment & Plan: Principal Problem:   Acute osteomyelitis of cervical spine (HCC) Active Problems:   Bacteremia due to Staphylococcus aureus   Anemia   IV drug user   Hypokalemia   Bacteremia   MSSA bacteremia, previous endocarditis incompletely treated Cervical spine discitis (abx day 24)  -mri cervical spine "Abnormal prevertebral edema extending from C1-2 inferiorly through C5. " -TEE on 4/22 ,negative for endocarditis  -Cultures negative since 4/16 -Continue cefazolin for 6 weeks, start date 4/16 -she received one dose of oritavancin on 4/29, next doses on 13th and 27th  -patient does not have a safe discharge plan, pt will remain inpt until IV abx complete and then go to detox facility  Right knee pain -underwent an MRI without features for septic arthritis, osteo or abscess  Opioid use disorder -cont Suboxone -taper started by ID as she said she was going to rehab in [place] (for a 63month program) where she can't be on suboxone -case discussed with internal medicine Dr Rayann Heman and pharmacy on 5/5, will taper suboxone over 30 days  Neck pain -Prn flexeril  Anxiety/depression: -Psychiatry consulted x2 on 4/18 and 4/22, Patient is psychiatrically  cleared -Continue Gabapentin 200 mg TID for anxiety and prn hydroxyzine.  -Cont Effexor XR 75 mg daily for mood and anxiety. -cont on low dose propranolol for anxiety as well; started this admit -buspar prn bid, started this admit  Difficult social situation, discharge plan still not certain.  Patient and family  is aware that abx treatment will be done at the end of this month, she is encouraged to work on drug rehab placement and get her plan finalized some time soon.  Per her and her family she can not be out in the community because of fear of relapse of drug use. Their plan for now is to go directly from the hospital to drug rehab program of their choice. Previously case was discussed with Psychiatrist as well by Dr. Erlinda Hong    DVT prophylaxis: Lovenox Code Status: Full code Family Communication: no family in the room Disposition Plan: substance rehab once finishes Abx  Consultants:   ID  Cards  Psych  Procedures:   TEE 4/22 Impressions: - No evidence of endocarditis. LVEF 55-60%. Small PFO.  Antimicrobials:  Ancef    Subjective: - no chest pain, shortness of breath, no abdominal pain, nausea or vomiting. Wants to sleep and somewhat   Objective: Vitals:   10/15/17 1020 10/15/17 1501 10/15/17 2158 10/16/17 0520  BP: 116/68 122/70 (!) 135/99 127/70  Pulse: 90 82 (!) 123 93  Resp:   18 18  Temp:  99.2 F (37.3 C) 99.3 F (37.4 C) 98.9 F (37.2 C)  TempSrc:  Oral Oral Oral  SpO2:  99% 98% 99%  Weight:  Height:        Intake/Output Summary (Last 24 hours) at 10/16/2017 1250 Last data filed at 10/15/2017 1500 Gross per 24 hour  Intake 120 ml  Output -  Net 120 ml   Filed Weights   10/02/17 1737 10/03/17 1455  Weight: 47.6 kg (105 lb) 47.6 kg (105 lb)    Examination:  Constitutional: NAD Respiratory: CTA Cardiovascular: RRR    Data Reviewed: I have independently reviewed following labs and imaging studies   CBC: Recent Labs    Lab 10/16/17 0446  WBC 7.9  HGB 9.9*  HCT 33.0*  MCV 78.9  PLT 878*   Basic Metabolic Panel: Recent Labs  Lab 10/16/17 0446  NA 139  K 3.3*  CL 105  CO2 26  GLUCOSE 101*  BUN 13  CREATININE 0.51  CALCIUM 8.8*   GFR: Estimated Creatinine Clearance: 77.3 mL/min (by C-G formula based on SCr of 0.51 mg/dL). Liver Function Tests: No results for input(s): AST, ALT, ALKPHOS, BILITOT, PROT, ALBUMIN in the last 168 hours. No results for input(s): LIPASE, AMYLASE in the last 168 hours. No results for input(s): AMMONIA in the last 168 hours. Coagulation Profile: No results for input(s): INR, PROTIME in the last 168 hours. Cardiac Enzymes: No results for input(s): CKTOTAL, CKMB, CKMBINDEX, TROPONINI in the last 168 hours. BNP (last 3 results) No results for input(s): PROBNP in the last 8760 hours. HbA1C: No results for input(s): HGBA1C in the last 72 hours. CBG: No results for input(s): GLUCAP in the last 168 hours. Lipid Profile: No results for input(s): CHOL, HDL, LDLCALC, TRIG, CHOLHDL, LDLDIRECT in the last 72 hours. Thyroid Function Tests: No results for input(s): TSH, T4TOTAL, FREET4, T3FREE, THYROIDAB in the last 72 hours. Anemia Panel: No results for input(s): VITAMINB12, FOLATE, FERRITIN, TIBC, IRON, RETICCTPCT in the last 72 hours. Urine analysis:    Component Value Date/Time   COLORURINE YELLOW 10/02/2017 2117   APPEARANCEUR CLOUDY (A) 10/02/2017 2117   LABSPEC 1.025 10/02/2017 2117   PHURINE 5.0 10/02/2017 2117   GLUCOSEU NEGATIVE 10/02/2017 2117   HGBUR NEGATIVE 10/02/2017 2117   BILIRUBINUR NEGATIVE 10/02/2017 2117   KETONESUR NEGATIVE 10/02/2017 2117   PROTEINUR NEGATIVE 10/02/2017 2117   UROBILINOGEN 0.2 10/15/2012 1341   NITRITE NEGATIVE 10/02/2017 2117   LEUKOCYTESUR NEGATIVE 10/02/2017 2117   Sepsis Labs: Invalid input(s): PROCALCITONIN, LACTICIDVEN  No results found for this or any previous visit (from the past 240 hour(s)).    Radiology  Studies: No results found.   Scheduled Meds: . buprenorphine-naloxone  1 tablet Sublingual Daily  . enoxaparin (LOVENOX) injection  40 mg Subcutaneous Daily  . gabapentin  200 mg Oral QHS  . gabapentin  400 mg Oral TID WC  . lidocaine  1 patch Transdermal Daily  . nicotine  21 mg Transdermal Daily  . propranolol  10 mg Oral BID  . venlafaxine XR  75 mg Oral Q breakfast   Continuous Infusions: .  ceFAZolin (ANCEF) IV 2 g (10/16/17 6767)     Marzetta Board, MD, PhD [facility] Pager [phone]  If 7PM-7AM, please contact night-coverage [email or web address] Password [email or web address] 10/16/2017, 12:50 PM

## 2017-10-17 NOTE — Progress Notes (Signed)
PROGRESS NOTE        PATIENT DETAILS Name: Sherri Francis Age: 32 y.o. Sex: female Date of Birth: 01/16/1986 Admit Date: 10/02/2017 Admitting Physician Vianne Bulls, MD GGY:IRSWNIO, No Pcp Per  Brief Narrative: Sherri Francis is a 32 y.o. 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  She unfortunately left AMA on 4/16, and then got a self readmitted on 4/17.  A TEE on 4/22 showed resolution of her tricuspid regurgitation-she was resumed on cefazolin and started on Suboxone.  She unfortunately left AMA again on 4/27 and returned within 4 hours to resume treatment.  She currently remains on IV Ancef with plans for a 6-week therapy from 4/16.  Subjective: Lying comfortably in bed-wants to go back to sleep.  Denies any chest pain or shortness of breath.  Patient's mother at bedside.  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  Repeat blood culture on 4/23-.  Given history of IVDA-we will need to remain inpatient until she completes her course of antimicrobial therapy-does not have a safe discharge plan.  Heroin abuse with withdrawal symptoms: Currently withdrawal symptoms well controlled with Suboxone.  Mother at bedside claims that when she is discharged from the hospital later this month-she will be directly admitted by the family to a rehab facility in [place], [place]  Anxiety/depression: Continues to be somewhat anxious but otherwise stable-continue Effexor.  Remains on low-dose propanolol for anxiety.  On BuSpar as needed  Right knee pain: Seems to have resolved-MRI on 4/29- for effusion-no clinical signs of septic arthritis at this time.  Chronic hepatitis C:  Stable for outpatient follow-up and treatment when she follows with infectious disease   DVT Prophylaxis: Prophylactic Lovenox  Code Status: Full code   Family Communication: Mother at bedside  Disposition Plan: Remain inpatient-we will remain inpatient until she completes a course of antimicrobial therapy-later this month.  Antimicrobial agents: Anti-infectives (From admission, onward)   Start     Dose/Rate Route Frequency Ordered Stop   10/04/17 1200  Oritavancin Diphosphate (ORBACTIV) 1,200 mg in dextrose 5 % IVPB     1,200 mg 333.3 mL/hr over 180 Minutes Intravenous Once 10/04/17 1108 10/04/17 1600   10/03/17 0600  ceFAZolin (ANCEF) IVPB 2g/100 mL premix     2 g 200 mL/hr over 30 Minutes Intravenous Every 8 hours 10/02/17 2311     10/02/17 2315  ceFAZolin (ANCEF) IVPB 1 g/50 mL premix     1 g 100 mL/hr over 30 Minutes Intravenous  Once 10/02/17 2312 10/03/17 0024   10/02/17 2130  ceFAZolin (ANCEF) IVPB 1 g/50 mL premix     1 g 100 mL/hr over 30 Minutes Intravenous  Once 10/02/17 2118 10/02/17 2224      Procedures: 4/22>> TEE  CONSULTS:  ID  Time spent: 25- minutes-Greater than 50% of this time was spent in counseling, explanation of diagnosis, planning of further management, and coordination of care.  MEDICATIONS: Scheduled Meds: . buprenorphine-naloxone  1 tablet Sublingual Daily  . enoxaparin (LOVENOX) injection  40 mg Subcutaneous Daily  . gabapentin  200 mg Oral QHS  . gabapentin  400 mg Oral TID WC  . lidocaine  1 patch Transdermal Daily  . nicotine  21 mg Transdermal Daily  . propranolol  10 mg Oral BID  . venlafaxine XR  75 mg Oral Q breakfast   Continuous Infusions: .  ceFAZolin (ANCEF) IV Stopped (10/17/17 0908)   PRN Meds:.acetaminophen **OR** acetaminophen, bisacodyl, busPIRone, cyclobenzaprine, diclofenac sodium, hydrOXYzine, ondansetron **OR** ondansetron (ZOFRAN) IV, senna-docusate   PHYSICAL EXAM: Vital signs: Vitals:   10/16/17 0520  10/16/17 1342 10/16/17 2109 10/17/17 0513  BP: 127/70 114/79 119/68 99/60  Pulse: 93 88 97 74  Resp: 18 16 18 16   Temp: 98.9 F (37.2 C) 99 F (37.2 C) 98.7 F (37.1 C) 98.5 F (36.9 C)  TempSrc: Oral Oral Oral Oral  SpO2: 99% 99% 99% 98%  Weight:      Height:       Filed Weights   10/02/17 1737 10/03/17 1455  Weight: 47.6 kg (105 lb) 47.6 kg (105 lb)   Body mass index is 19.84 kg/m.   General appearance :Awake, alert, not in any distress. Speech Clear. Not toxic Looking HEENT: Atraumatic and Normocephalic Neck: supple Resp:Good air entry bilaterally, no added sounds  CVS: S1 S2 regular, no murmurs.  GI: Bowel sounds present, Non tender and not distended with no gaurding, rigidity or rebound.No organomegaly Extremities: B/L Lower Ext shows no edema, both legs are warm to touch Neurology:  speech clear,Non focal, sensation is grossly intact. Psychiatric: Normal judgment and insight. Alert and oriented x 3. Normal mood. Musculoskeletal:No digital cyanosis Skin:No Rash, warm and dry Wounds:N/A  I have personally reviewed following labs and imaging studies  LABORATORY DATA: CBC: Recent Labs  Lab 10/16/17 0446  WBC 7.9  HGB 9.9*  HCT 33.0*  MCV 78.9  PLT 433*    Basic Metabolic Panel: Recent Labs  Lab 10/16/17 0446  NA 139  K 3.3*  CL 105  CO2 26  GLUCOSE 101*  BUN 13  CREATININE 0.51  CALCIUM 8.8*    GFR: Estimated Creatinine Clearance: 77.3 mL/min (by C-G formula based on SCr of 0.51 mg/dL).  Liver Function Tests: No results for input(s): AST, ALT, ALKPHOS, BILITOT, PROT, ALBUMIN in the last 168 hours. No results for input(s): LIPASE, AMYLASE in the last 168 hours. No results for input(s): AMMONIA in the last 168 hours.  Coagulation Profile: No results for input(s): INR, PROTIME in the last 168 hours.  Cardiac Enzymes: No results for input(s): CKTOTAL, CKMB, CKMBINDEX, TROPONINI in the last 168 hours.  BNP (last 3 results) No results for  input(s): PROBNP in the last 8760 hours.  HbA1C: No results for input(s): HGBA1C in the last 72 hours.  CBG: No results for input(s): GLUCAP in the last 168 hours.  Lipid Profile: No results for input(s): CHOL, HDL, LDLCALC, TRIG, CHOLHDL, LDLDIRECT in the last 72 hours.  Thyroid Function Tests: No results for input(s): TSH, T4TOTAL, FREET4, T3FREE, THYROIDAB in the last 72 hours.  Anemia Panel: No results for input(s): VITAMINB12, FOLATE, FERRITIN, TIBC, IRON, RETICCTPCT in the last 72 hours.  Urine analysis:    Component Value Date/Time   COLORURINE YELLOW 10/02/2017 2117   APPEARANCEUR CLOUDY (A) 10/02/2017 2117   LABSPEC 1.025 10/02/2017 2117   PHURINE 5.0 10/02/2017 2117   GLUCOSEU NEGATIVE 10/02/2017 2117   HGBUR NEGATIVE 10/02/2017 2117   BILIRUBINUR NEGATIVE 10/02/2017 2117   KETONESUR NEGATIVE 10/02/2017 2117   PROTEINUR NEGATIVE 10/02/2017 2117   UROBILINOGEN 0.2 10/15/2012 1341   NITRITE NEGATIVE 10/02/2017 2117   LEUKOCYTESUR NEGATIVE 10/02/2017 2117    Sepsis  Labs: Lactic Acid, Venous    Component Value Date/Time   LATICACIDVEN 2.07 (HH) 01/27/2017 1336    MICROBIOLOGY: No results found for this or any previous visit (from the past 240 hour(s)).  RADIOLOGY STUDIES/RESULTS: Dg Chest 2 View  Result Date: 09/20/2017 CLINICAL DATA:  Fever and neck stiffness for 2 days. EXAM: CHEST - 2 VIEW COMPARISON:  PA and lateral chest 02/24/2017 and 01/24/2017. CT chest 01/27/2017. FINDINGS: The lungs are clear. Cavitary lesions in the right chest seen on prior examinations are not visualized today. No pneumothorax or pleural fluid. Heart size is normal. No bony abnormality. IMPRESSION: Negative chest. Electronically Signed   By: Inge Rise M.D.   On: 09/20/2017 09:23   Mr Cervical Spine Wo Contrast  Result Date: 09/20/2017 CLINICAL DATA:  Initial evaluation for possible epidural abscess. History of IVDU, endocarditis. EXAM: MRI CERVICAL SPINE WITHOUT CONTRAST  TECHNIQUE: Multiplanar, multisequence MR imaging of the cervical spine was performed. No intravenous contrast was administered. COMPARISON:  None available. FINDINGS: Alignment: Examination limited as the patient was unable to tolerate the full length of the exam. No post-contrast sequences were performed. Additionally, imaging of the thorax was ordered but not performed due to patient's inability to continue. Vertebral bodies normally aligned with preservation of the normal cervical lordosis. No listhesis. Vertebrae: Vertebral body heights maintained without evidence for acute or chronic fracture. Bone marrow signal intensity diffusely decreased on T1 weighted imaging, most commonly related to anemia, smoking, obesity, or other chronic disease. No discrete or worrisome osseous lesions. No abnormal marrow edema to suggest acute osteomyelitis. Cord: Signal intensity within the cervical spinal cord is normal. No discernible epidural collections identified on this noncontrast examination. Posterior Fossa, vertebral arteries, paraspinal tissues: Visualized brain and posterior fossa within normal limits. Scattered mucosal thickening within the left maxillary sinus. There is abnormal prevertebral edema/effusion normal intravascular flow voids present within the vertebral arteries bilaterally. Adjacent longus coli musculature relatively normal in appearance. No other significant soft tissue edema or inflammatory changes within the visualized neck. Mildly prominent bilateral level 2 lymph nodes measure to the upper limits of normal at approximately 1 cm in short axis. Extending from C1 through C5, nonspecific, but could reflect sequelae of acute infection Disc levels: No significant disc pathology seen within the cervical spine. No findings to suggest acute discitis. No disc bulge or disc protrusion. No significant canal or foraminal stenosis. IMPRESSION: 1. Limited study due to patient's inability to tolerate the full  length of the exam. Noncontrast imaging of the cervical spinal E was performed. 2. Abnormal prevertebral edema extending from C1-2 inferiorly through C5. While this finding is nonspecific, this is most suspicious for possible infection given provided history. Source not entirely clear, as there are no other imaging findings to suggest osteomyelitis discitis on this exam. No discernible epidural abscess or other intraspinal infection. Further evaluation with dedicated neck CT may be helpful for further evaluation as clinically warranted. Electronically Signed   By: Jeannine Boga M.D.   On: 09/20/2017 20:06   Mr Thoracic Spine W Contrast  Result Date: 09/20/2017 CLINICAL DATA:  Initial evaluation for possible epidural abscess. History of IVDU, endocarditis. EXAM: MRI CERVICAL SPINE WITHOUT CONTRAST TECHNIQUE: Multiplanar, multisequence MR imaging of the cervical spine was performed. No intravenous contrast was administered. COMPARISON:  None available. FINDINGS: Alignment: Examination limited as the patient was unable to tolerate the full length of the exam. No post-contrast sequences were performed. Additionally, imaging of the thorax was ordered but not performed due to  patient's inability to continue. Vertebral bodies normally aligned with preservation of the normal cervical lordosis. No listhesis. Vertebrae: Vertebral body heights maintained without evidence for acute or chronic fracture. Bone marrow signal intensity diffusely decreased on T1 weighted imaging, most commonly related to anemia, smoking, obesity, or other chronic disease. No discrete or worrisome osseous lesions. No abnormal marrow edema to suggest acute osteomyelitis. Cord: Signal intensity within the cervical spinal cord is normal. No discernible epidural collections identified on this noncontrast examination. Posterior Fossa, vertebral arteries, paraspinal tissues: Visualized brain and posterior fossa within normal limits. Scattered  mucosal thickening within the left maxillary sinus. There is abnormal prevertebral edema/effusion normal intravascular flow voids present within the vertebral arteries bilaterally. Adjacent longus coli musculature relatively normal in appearance. No other significant soft tissue edema or inflammatory changes within the visualized neck. Mildly prominent bilateral level 2 lymph nodes measure to the upper limits of normal at approximately 1 cm in short axis. Extending from C1 through C5, nonspecific, but could reflect sequelae of acute infection Disc levels: No significant disc pathology seen within the cervical spine. No findings to suggest acute discitis. No disc bulge or disc protrusion. No significant canal or foraminal stenosis. IMPRESSION: 1. Limited study due to patient's inability to tolerate the full length of the exam. Noncontrast imaging of the cervical spinal E was performed. 2. Abnormal prevertebral edema extending from C1-2 inferiorly through C5. While this finding is nonspecific, this is most suspicious for possible infection given provided history. Source not entirely clear, as there are no other imaging findings to suggest osteomyelitis discitis on this exam. No discernible epidural abscess or other intraspinal infection. Further evaluation with dedicated neck CT may be helpful for further evaluation as clinically warranted. Electronically Signed   By: Jeannine Boga M.D.   On: 09/20/2017 20:06   Mr Lumbar Spine W Wo Contrast  Result Date: 09/29/2017 CLINICAL DATA:  32 y/o F; back pain with history of polysubstance/IV drug use, concern for infection. EXAM: MRI LUMBAR SPINE WITHOUT AND WITH CONTRAST TECHNIQUE: Multiplanar and multiecho pulse sequences of the lumbar spine were obtained without and with intravenous contrast. CONTRAST:  34mL MULTIHANCE GADOBENATE DIMEGLUMINE 529 MG/ML IV SOLN COMPARISON:  02/07/2017 CT abdomen and pelvis. FINDINGS: Segmentation:  Standard. Alignment:   Physiologic. Vertebrae: No fracture, evidence of discitis, or bone lesion. No abnormal enhancement. Small L1 inferior endplate chronic Schmorl's node. Conus medullaris and cauda equina: Conus extends to the L1 level. Conus and cauda equina appear normal. No abnormal enhancement. Paraspinal and other soft tissues: 4 mm cyst in the lower pole of right kidney. Disc levels: No significant disc displacement, foraminal stenosis, or canal stenosis. IMPRESSION: Negative lumbar spine MRI.  No evidence of discitis osteomyelitis. Electronically Signed   By: [name] M.D.   On: 09/29/2017 22:21   Mr Knee Right Wo Contrast  Result Date: 10/05/2017 CLINICAL DATA:  Right knee pain and swelling. History of IV drug abuse. EXAM: MRI OF THE RIGHT KNEE WITHOUT CONTRAST TECHNIQUE: Multiplanar, multisequence MR imaging of the knee was performed. No intravenous contrast was administered. COMPARISON:  None. FINDINGS: MENISCI Medial meniscus:  Intact Lateral meniscus:  Intact LIGAMENTS Cruciates:  Intact.  Mucoid degeneration of the ACL. Collaterals:  Intact CARTILAGE Patellofemoral:  Normal Medial:  Normal Lateral:  Normal Joint:  No joint effusion or synovitis. Popliteal Fossa:  No popliteal mass or Baker's cyst. Extensor Mechanism: The patella retinacular structures are intact and the quadriceps and patellar tendons are intact. Bones:  No acute bony findings.  No evidence of osteomyelitis. Other: Mild subcutaneous soft tissue swelling/edema/fluid suggesting cellulitis. No drainable soft tissue abscess. IMPRESSION: 1. No MR findings to suggest septic arthritis, osteomyelitis or drainable soft tissue abscess. 2. Mild diffuse cellulitis. 3. No findings for internal derangement. Electronically Signed   By: Marijo Sanes M.D.   On: 10/05/2017 10:26     LOS: 15 days   Oren Binet, MD  [facility] Pager:[phone]  If 7PM-7AM, please contact night-coverage [email or web address] Password [facility] 10/17/2017, 12:49  PM

## 2017-10-17 NOTE — Progress Notes (Signed)
MEDICATION RELATED CONSULT NOTE - FOLLOW UP   Pharmacy Consult:  Suboxone tapering  No Known Allergies  Patient Measurements: Height: 5\' 1"  (154.9 cm) Weight: 105 lb (47.6 kg) IBW/kg (Calculated) : 47.8  Vital Signs: Temp: 98.5 F (36.9 C) (05/12 0513) Temp Source: Oral (05/12 0513) BP: 99/60 (05/12 0513) Pulse Rate: 74 (05/12 0513)   Assessment: 43 YOF with history of IVDU, hepatitis C and MSSA bacteremia/endocarditis to undergo Suboxone taper so she could be admitted into Rehab.  Suboxone requires slow tapering to avoid withdrawal and current plan is to taper over 30 days, reducing by increments of 2mg  every 3-5 days.  Suboxone (buprenorphine) was reduced from 12mg  to 10mg  daily on 10/08/16. Patient tolerated and then was tapered to 8-2mg  SL once daily on 10/05/17. Patient has tolerated taper thus far without withdrawal symptoms. Dr. Evette Doffing should be back this week. Discussed case with Dr. Sloan Leiter and patient is stable for now so will leave at current dose and work with Dr. Evette Doffing next week.    Plan:  Continue Suboxone to 8-2mg  SL once daily  Monitor for withdrawal.  Follow-up taper plans with Dr. Evette Doffing upon his return this week. Propranolol 10mg  PO BID + PRN hydroxyzine for anxiety  Sloan Leiter, PharmD, BCPS, BCCCP Clinical Pharmacist Clinical phone 10/17/2017 until 3:30PM - [phone] After hours, please call [phone] 10/17/2017, 1:26 PM

## 2017-10-18 LAB — PREGNANCY, URINE: PREG TEST UR: NEGATIVE

## 2017-10-18 NOTE — Plan of Care (Signed)
?  Problem: Clinical Measurements: ?Goal: Will remain free from infection ?Outcome: Progressing ?Goal: Diagnostic test results will improve ?Outcome: Progressing ?Goal: Respiratory complications will improve ?Outcome: Progressing ?  ?

## 2017-10-18 NOTE — Progress Notes (Signed)
PROGRESS NOTE        PATIENT DETAILS Name: Sherri Francis Age: 32 y.o. Sex: female Date of Birth: 15-Jul-1985 Admit Date: 10/02/2017 Admitting Physician Vianne Bulls, MD [id number], No Pcp Per  Brief Narrative: Sherri Francis is a 32 y.o. 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  She unfortunately left AMA on 4/16, and then got a self readmitted on 4/17.  A TEE on 4/22 showed resolution of her tricuspid regurgitation-she was resumed on cefazolin and started on Suboxone.  She unfortunately left AMA again on 4/27 and returned within 4 hours to resume treatment.  She currently remains on IV Ancef with plans for a 6-week therapy from 4/16.  Subjective: Complains of some bloating in her lower abdomen.  Claims she has not had a menstrual period since this past October.   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  Repeat blood pressure on 4/23.  Not a candidate for outpatient IV antibiotic therapy given history of IVDA.  Plans are to remain inpatient until later this month she completes antimicrobial therapy  Heroin abuse with withdrawal symptoms: Withdrawal symptoms are controlled with Suboxone.  Mother at bedside on 5/12 indicated that family will take patient directly to a rehab facility in [place] from the hospital.   Anxiety/depression: Stable-continue Effexor, low-dose propanolol for anxiety-continue BuSpar as needed.   Right knee pain: Seems to have resolved-MRI on 4/29- for effusion-no clinical signs of septic arthritis at this time.  Chronic hepatitis C: Stable for outpatient follow-up and treatment when she follows with infectious disease  Amenorrhea: Will  check an pregnancy test-as noted-she claims that her last menstrual period was this past October.  DVT Prophylaxis: Prophylactic Lovenox  Code Status: Full code   Family Communication: None at bedside  Disposition Plan: Remain inpatient-we will remain inpatient until she completes a course of antimicrobial therapy-later this month.  Antimicrobial agents: Anti-infectives (From admission, onward)   Start     Dose/Rate Route Frequency Ordered Stop   10/04/17 1200  Oritavancin Diphosphate (ORBACTIV) 1,200 mg in dextrose 5 % IVPB     1,200 mg 333.3 mL/hr over 180 Minutes Intravenous Once 10/04/17 1108 10/04/17 1600   10/03/17 0600  ceFAZolin (ANCEF) IVPB 2g/100 mL premix     2 g 200 mL/hr over 30 Minutes Intravenous Every 8 hours 10/02/17 2311     10/02/17 2315  ceFAZolin (ANCEF) IVPB 1 g/50 mL premix     1 g 100 mL/hr over 30 Minutes Intravenous  Once 10/02/17 2312 10/03/17 0024   10/02/17 2130  ceFAZolin (ANCEF) IVPB 1 g/50 mL premix     1 g 100 mL/hr over 30 Minutes Intravenous  Once 10/02/17 2118 10/02/17 2224      Procedures: 4/22>> TEE  CONSULTS:  ID  Time spent: 25- minutes-Greater than 50% of this time was spent in counseling, explanation of diagnosis, planning of further management, and coordination of care.  MEDICATIONS: Scheduled Meds: . buprenorphine-naloxone  1 tablet Sublingual Daily  . enoxaparin (LOVENOX) injection  40 mg Subcutaneous Daily  . gabapentin  200 mg Oral QHS  . gabapentin  400 mg Oral TID WC  . lidocaine  1 patch Transdermal Daily  . nicotine  21 mg Transdermal Daily  . propranolol  10 mg Oral BID  . venlafaxine XR  75 mg Oral Q breakfast   Continuous Infusions: .  ceFAZolin (ANCEF) IV 2 g (10/18/17 0614)   PRN Meds:.acetaminophen **OR** acetaminophen, bisacodyl, busPIRone, cyclobenzaprine, diclofenac sodium, hydrOXYzine, ondansetron **OR** ondansetron (ZOFRAN) IV, senna-docusate   PHYSICAL EXAM: Vital signs: Vitals:   10/17/17 0513  10/17/17 1504 10/17/17 2100 10/18/17 0554  BP: 99/60 113/73 123/66 (!) 157/83  Pulse: 74 (!) 104 90 99  Resp: 16 18 18 18   Temp: 98.5 F (36.9 C) 98.1 F (36.7 C) 98.4 F (36.9 C) 98.6 F (37 C)  TempSrc: Oral Oral Oral Oral  SpO2: 98% 99% 98% 98%  Weight:      Height:       Filed Weights   10/02/17 1737 10/03/17 1455  Weight: 47.6 kg (105 lb) 47.6 kg (105 lb)   Body mass index is 19.84 kg/m.   General appearance :Awake, alert, not in any distress.  HEENT: Atraumatic and Normocephalic Neck: supple, no JVD. Resp:Good air entry bilaterally, no rales or rhonchi CVS: S1 S2 regular, no murmurs.  GI: Bowel sounds present, Non tender and not distended with no gaurding, rigidity or rebound. Extremities: B/L Lower Ext shows no edema, both legs are warm to touch Neurology:  speech clear,Non focal, sensation is grossly intact. Psychiatric: Normal judgment and insight. Normal mood. Musculoskeletal:No digital cyanosis Skin:No Rash, warm and dry Wounds:N/A  I have personally reviewed following labs and imaging studies  LABORATORY DATA: CBC: Recent Labs  Lab 10/16/17 0446  WBC 7.9  HGB 9.9*  HCT 33.0*  MCV 78.9  PLT 433*    Basic Metabolic Panel: Recent Labs  Lab 10/16/17 0446  NA 139  K 3.3*  CL 105  CO2 26  GLUCOSE 101*  BUN 13  CREATININE 0.51  CALCIUM 8.8*    GFR: Estimated Creatinine Clearance: 77.3 mL/min (by C-G formula based on SCr of 0.51 mg/dL).  Liver Function Tests: No results for input(s): AST, ALT, ALKPHOS, BILITOT, PROT, ALBUMIN in the last 168 hours. No results for input(s): LIPASE, AMYLASE in the last 168 hours. No results for input(s): AMMONIA in the last 168 hours.  Coagulation Profile: No results for input(s): INR, PROTIME in the last 168 hours.  Cardiac Enzymes: No results for input(s): CKTOTAL, CKMB, CKMBINDEX, TROPONINI in the last 168 hours.  BNP (last 3 results) No results for input(s): PROBNP in the last 8760 hours.  HbA1C: No  results for input(s): HGBA1C in the last 72 hours.  CBG: No results for input(s): GLUCAP in the last 168 hours.  Lipid Profile: No results for input(s): CHOL, HDL, LDLCALC, TRIG, CHOLHDL, LDLDIRECT in the last 72 hours.  Thyroid Function Tests: No results for input(s): TSH, T4TOTAL, FREET4, T3FREE, THYROIDAB in the last 72 hours.  Anemia Panel: No results for input(s): VITAMINB12, FOLATE, FERRITIN, TIBC, IRON, RETICCTPCT in the last 72 hours.  Urine analysis:    Component Value Date/Time   COLORURINE YELLOW 10/02/2017 2117   APPEARANCEUR CLOUDY (A) 10/02/2017 2117   LABSPEC 1.025 10/02/2017 2117   PHURINE 5.0 10/02/2017 2117   GLUCOSEU NEGATIVE 10/02/2017 2117   HGBUR NEGATIVE 10/02/2017 2117   BILIRUBINUR NEGATIVE 10/02/2017 2117   KETONESUR NEGATIVE 10/02/2017 2117   PROTEINUR NEGATIVE 10/02/2017 2117   UROBILINOGEN 0.2 10/15/2012 1341   NITRITE NEGATIVE 10/02/2017 2117   LEUKOCYTESUR NEGATIVE 10/02/2017 2117    Sepsis Labs: Lactic Acid, Venous  Component Value Date/Time   LATICACIDVEN 2.07 (HH) 01/27/2017 1336    MICROBIOLOGY: No results found for this or any previous visit (from the past 240 hour(s)).  RADIOLOGY STUDIES/RESULTS: Dg Chest 2 View  Result Date: 09/20/2017 CLINICAL DATA:  Fever and neck stiffness for 2 days. EXAM: CHEST - 2 VIEW COMPARISON:  PA and lateral chest 02/24/2017 and 01/24/2017. CT chest 01/27/2017. FINDINGS: The lungs are clear. Cavitary lesions in the right chest seen on prior examinations are not visualized today. No pneumothorax or pleural fluid. Heart size is normal. No bony abnormality. IMPRESSION: Negative chest. Electronically Signed   By: Inge Rise M.D.   On: 09/20/2017 09:23   Mr Cervical Spine Wo Contrast  Result Date: 09/20/2017 CLINICAL DATA:  Initial evaluation for possible epidural abscess. History of IVDU, endocarditis. EXAM: MRI CERVICAL SPINE WITHOUT CONTRAST TECHNIQUE: Multiplanar, multisequence MR imaging of the  cervical spine was performed. No intravenous contrast was administered. COMPARISON:  None available. FINDINGS: Alignment: Examination limited as the patient was unable to tolerate the full length of the exam. No post-contrast sequences were performed. Additionally, imaging of the thorax was ordered but not performed due to patient's inability to continue. Vertebral bodies normally aligned with preservation of the normal cervical lordosis. No listhesis. Vertebrae: Vertebral body heights maintained without evidence for acute or chronic fracture. Bone marrow signal intensity diffusely decreased on T1 weighted imaging, most commonly related to anemia, smoking, obesity, or other chronic disease. No discrete or worrisome osseous lesions. No abnormal marrow edema to suggest acute osteomyelitis. Cord: Signal intensity within the cervical spinal cord is normal. No discernible epidural collections identified on this noncontrast examination. Posterior Fossa, vertebral arteries, paraspinal tissues: Visualized brain and posterior fossa within normal limits. Scattered mucosal thickening within the left maxillary sinus. There is abnormal prevertebral edema/effusion normal intravascular flow voids present within the vertebral arteries bilaterally. Adjacent longus coli musculature relatively normal in appearance. No other significant soft tissue edema or inflammatory changes within the visualized neck. Mildly prominent bilateral level 2 lymph nodes measure to the upper limits of normal at approximately 1 cm in short axis. Extending from C1 through C5, nonspecific, but could reflect sequelae of acute infection Disc levels: No significant disc pathology seen within the cervical spine. No findings to suggest acute discitis. No disc bulge or disc protrusion. No significant canal or foraminal stenosis. IMPRESSION: 1. Limited study due to patient's inability to tolerate the full length of the exam. Noncontrast imaging of the cervical  spinal E was performed. 2. Abnormal prevertebral edema extending from C1-2 inferiorly through C5. While this finding is nonspecific, this is most suspicious for possible infection given provided history. Source not entirely clear, as there are no other imaging findings to suggest osteomyelitis discitis on this exam. No discernible epidural abscess or other intraspinal infection. Further evaluation with dedicated neck CT may be helpful for further evaluation as clinically warranted. Electronically Signed   By: Jeannine Boga M.D.   On: 09/20/2017 20:06   Mr Thoracic Spine W Contrast  Result Date: 09/20/2017 CLINICAL DATA:  Initial evaluation for possible epidural abscess. History of IVDU, endocarditis. EXAM: MRI CERVICAL SPINE WITHOUT CONTRAST TECHNIQUE: Multiplanar, multisequence MR imaging of the cervical spine was performed. No intravenous contrast was administered. COMPARISON:  None available. FINDINGS: Alignment: Examination limited as the patient was unable to tolerate the full length of the exam. No post-contrast sequences were performed. Additionally, imaging of the thorax was ordered but not performed due to patient's inability to continue. Vertebral bodies normally  aligned with preservation of the normal cervical lordosis. No listhesis. Vertebrae: Vertebral body heights maintained without evidence for acute or chronic fracture. Bone marrow signal intensity diffusely decreased on T1 weighted imaging, most commonly related to anemia, smoking, obesity, or other chronic disease. No discrete or worrisome osseous lesions. No abnormal marrow edema to suggest acute osteomyelitis. Cord: Signal intensity within the cervical spinal cord is normal. No discernible epidural collections identified on this noncontrast examination. Posterior Fossa, vertebral arteries, paraspinal tissues: Visualized brain and posterior fossa within normal limits. Scattered mucosal thickening within the left maxillary sinus. There  is abnormal prevertebral edema/effusion normal intravascular flow voids present within the vertebral arteries bilaterally. Adjacent longus coli musculature relatively normal in appearance. No other significant soft tissue edema or inflammatory changes within the visualized neck. Mildly prominent bilateral level 2 lymph nodes measure to the upper limits of normal at approximately 1 cm in short axis. Extending from C1 through C5, nonspecific, but could reflect sequelae of acute infection Disc levels: No significant disc pathology seen within the cervical spine. No findings to suggest acute discitis. No disc bulge or disc protrusion. No significant canal or foraminal stenosis. IMPRESSION: 1. Limited study due to patient's inability to tolerate the full length of the exam. Noncontrast imaging of the cervical spinal E was performed. 2. Abnormal prevertebral edema extending from C1-2 inferiorly through C5. While this finding is nonspecific, this is most suspicious for possible infection given provided history. Source not entirely clear, as there are no other imaging findings to suggest osteomyelitis discitis on this exam. No discernible epidural abscess or other intraspinal infection. Further evaluation with dedicated neck CT may be helpful for further evaluation as clinically warranted. Electronically Signed   By: Jeannine Boga M.D.   On: 09/20/2017 20:06   Mr Lumbar Spine W Wo Contrast  Result Date: 09/29/2017 CLINICAL DATA:  32 y/o F; back pain with history of polysubstance/IV drug use, concern for infection. EXAM: MRI LUMBAR SPINE WITHOUT AND WITH CONTRAST TECHNIQUE: Multiplanar and multiecho pulse sequences of the lumbar spine were obtained without and with intravenous contrast. CONTRAST:  19mL MULTIHANCE GADOBENATE DIMEGLUMINE 529 MG/ML IV SOLN COMPARISON:  02/07/2017 CT abdomen and pelvis. FINDINGS: Segmentation:  Standard. Alignment:  Physiologic. Vertebrae: No fracture, evidence of discitis, or bone  lesion. No abnormal enhancement. Small L1 inferior endplate chronic Schmorl's node. Conus medullaris and cauda equina: Conus extends to the L1 level. Conus and cauda equina appear normal. No abnormal enhancement. Paraspinal and other soft tissues: 4 mm cyst in the lower pole of right kidney. Disc levels: No significant disc displacement, foraminal stenosis, or canal stenosis. IMPRESSION: Negative lumbar spine MRI.  No evidence of discitis osteomyelitis. Electronically Signed   By: [name] M.D.   On: 09/29/2017 22:21   Mr Knee Right Wo Contrast  Result Date: 10/05/2017 CLINICAL DATA:  Right knee pain and swelling. History of IV drug abuse. EXAM: MRI OF THE RIGHT KNEE WITHOUT CONTRAST TECHNIQUE: Multiplanar, multisequence MR imaging of the knee was performed. No intravenous contrast was administered. COMPARISON:  None. FINDINGS: MENISCI Medial meniscus:  Intact Lateral meniscus:  Intact LIGAMENTS Cruciates:  Intact.  Mucoid degeneration of the ACL. Collaterals:  Intact CARTILAGE Patellofemoral:  Normal Medial:  Normal Lateral:  Normal Joint:  No joint effusion or synovitis. Popliteal Fossa:  No popliteal mass or Baker's cyst. Extensor Mechanism: The patella retinacular structures are intact and the quadriceps and patellar tendons are intact. Bones:  No acute bony findings.  No evidence of osteomyelitis. Other: Mild  subcutaneous soft tissue swelling/edema/fluid suggesting cellulitis. No drainable soft tissue abscess. IMPRESSION: 1. No MR findings to suggest septic arthritis, osteomyelitis or drainable soft tissue abscess. 2. Mild diffuse cellulitis. 3. No findings for internal derangement. Electronically Signed   By: Marijo Sanes M.D.   On: 10/05/2017 10:26     LOS: 16 days   Oren Binet, MD  [facility] Pager:[phone]  If 7PM-7AM, please contact night-coverage [email or web address] Password [facility] 10/18/2017, 10:47 AM

## 2017-10-19 DIAGNOSIS — N912 Amenorrhea, unspecified: Secondary | ICD-10-CM

## 2017-10-19 MED ORDER — IBUPROFEN 600 MG PO TABS
600.0000 mg | ORAL_TABLET | Freq: Four times a day (QID) | ORAL | Status: DC | PRN
  Administered 2017-10-19: 600 mg via ORAL
  Filled 2017-10-19: qty 1

## 2017-10-19 MED ORDER — BUPRENORPHINE HCL-NALOXONE HCL 2-0.5 MG SL SUBL
2.0000 | SUBLINGUAL_TABLET | Freq: Every day | SUBLINGUAL | Status: AC
  Administered 2017-10-23 – 2017-10-25 (×3): 2 via SUBLINGUAL
  Filled 2017-10-19: qty 1
  Filled 2017-10-19: qty 2
  Filled 2017-10-19 (×2): qty 1

## 2017-10-19 MED ORDER — BUPRENORPHINE HCL-NALOXONE HCL 2-0.5 MG SL SUBL
3.0000 | SUBLINGUAL_TABLET | Freq: Every day | SUBLINGUAL | Status: AC
  Administered 2017-10-20 – 2017-10-22 (×3): 3 via SUBLINGUAL
  Filled 2017-10-19: qty 3
  Filled 2017-10-19: qty 1
  Filled 2017-10-19: qty 3
  Filled 2017-10-19: qty 1
  Filled 2017-10-19: qty 2

## 2017-10-19 MED ORDER — BUPRENORPHINE HCL-NALOXONE HCL 2-0.5 MG SL SUBL
1.0000 | SUBLINGUAL_TABLET | Freq: Every day | SUBLINGUAL | Status: DC
  Administered 2017-10-26 – 2017-10-27 (×2): 1 via SUBLINGUAL
  Filled 2017-10-19 (×3): qty 1

## 2017-10-19 NOTE — Progress Notes (Signed)
MEDICATION RELATED CONSULT NOTE - FOLLOW UP   Pharmacy Consult:  Suboxone tapering  No Known Allergies  Patient Measurements: Height: 5\' 1"  (154.9 cm) Weight: 105 lb (47.6 kg) IBW/kg (Calculated) : 47.8  Vital Signs: Temp: 98.2 F (36.8 C) (05/14 1430) Temp Source: Oral (05/14 1430) BP: 107/70 (05/14 1432) Pulse Rate: 81 (05/14 1432)   Assessment: 37 YOF with history of IVDU, hepatitis C and MSSA bacteremia/endocarditis to undergo Suboxone taper so she could be admitted into Rehab.  Suboxone requires slow tapering to avoid withdrawal and current plan is to taper over 30 days, reducing by increments of 2mg  every 3-5 days.  Suboxone (buprenorphine) was reduced from 12mg  to 10mg  daily on 10/08/16. Patient tolerated and then was tapered to 8-2mg  SL once daily on 10/05/17. Patient has tolerated taper thus far without withdrawal symptoms.   Stop date for antibiotics ~11/01/17 so need to lower Suboxone dose for discharge.   Plan:  Decrease Suboxone to 6-1.5mg  SL daily for 3 days, 4-1mg  SL daily for 3 days, 2-0.5mg  SL daily for 3 days, then off Monitor for withdrawal.  Paged Dr. Evette Doffing today, no call back as of yet Propranolol 10mg  PO BID + PRN hydroxyzine for anxiety   Renold Genta, PharmD, BCPS Clinical Pharmacist Clinical phone for 10/19/2017 until 3p is [phone] After 3p, please call Main [facility] at [phone] for assistance 10/19/2017 3:32 PM

## 2017-10-19 NOTE — Progress Notes (Signed)
PROGRESS NOTE        PATIENT DETAILS Name: Sherri Francis Age: 32 y.o. Sex: female Date of Birth: 1985/07/05 Admit Date: 10/02/2017 Admitting Physician Vianne Bulls, MD [id number], No Pcp Per  Brief Narrative: Sherri Francis is a 32 y.o. 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  She unfortunately left AMA on 4/16, and then got a self readmitted on 4/17.  A TEE on 4/22 showed resolution of her tricuspid regurgitation-she was resumed on cefazolin and started on Suboxone.  She unfortunately left AMA again on 4/27 and returned within 4 hours to resume treatment.  She currently remains on IV Ancef with plans for a 6-week therapy from 4/16.  Subjective: She started having her menstrual cycle this morning-her last menstrual cycle was in October.  Urine pregnancy test yesterday was negative.  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  Repeat blood culture on 4/23 was negative.  Not a candidate for outpatient IV antibiotic therapy given history of IVDA.  Continuing patient until she finishes a course of antimicrobial therapy.    Heroin abuse with withdrawal symptoms: Withdrawal symptoms are controlled with Suboxone.  Mother at bedside on 5/12 indicated that family will take patient directly to a rehab facility in [place] from the hospital.  Will slowly taper Suboxone.  Anxiety/depression: Stable-continue Effexor, low-dose propanolol for anxiety-continue BuSpar as needed.   Right knee pain: Seems to have resolved-MRI on 4/29- for effusion-no clinical signs of septic arthritis at this time.  Chronic hepatitis C: Stable for outpatient follow-up and treatment when she follows  with infectious disease  Amenorrhea: Urine pregnancy test negative-her last menstrual cycle was in [place] she started having menstrual cramps and her cycle starting this morning.  Start NSAIDs as needed for menstrual cramps.    DVT Prophylaxis: Prophylactic Lovenox  Code Status: Full code   Family Communication: None at bedside  Disposition Plan: Remain inpatient-we will remain inpatient until she completes a course of antimicrobial therapy-later this month.  Antimicrobial agents: Anti-infectives (From admission, onward)   Start     Dose/Rate Route Frequency Ordered Stop   10/04/17 1200  Oritavancin Diphosphate (ORBACTIV) 1,200 mg in dextrose 5 % IVPB     1,200 mg 333.3 mL/hr over 180 Minutes Intravenous Once 10/04/17 1108 10/04/17 1600   10/03/17 0600  ceFAZolin (ANCEF) IVPB 2g/100 mL premix     2 g 200 mL/hr over 30 Minutes Intravenous Every 8 hours 10/02/17 2311     10/02/17 2315  ceFAZolin (ANCEF) IVPB 1 g/50 mL premix     1 g 100 mL/hr over 30 Minutes Intravenous  Once 10/02/17 2312 10/03/17 0024   10/02/17 2130  ceFAZolin (ANCEF) IVPB 1 g/50 mL premix     1 g 100 mL/hr over 30 Minutes Intravenous  Once 10/02/17 2118 10/02/17 2224      Procedures: 4/22>> TEE  CONSULTS:  ID  Time spent: 25- minutes-Greater than 50% of this time was spent in counseling, explanation of diagnosis, planning of further management, and coordination of care.  MEDICATIONS: Scheduled Meds: . buprenorphine-naloxone  1 tablet Sublingual Daily  . enoxaparin (LOVENOX) injection  40 mg Subcutaneous Daily  .  gabapentin  200 mg Oral QHS  . gabapentin  400 mg Oral TID WC  . lidocaine  1 patch Transdermal Daily  . nicotine  21 mg Transdermal Daily  . propranolol  10 mg Oral BID  . venlafaxine XR  75 mg Oral Q breakfast   Continuous Infusions: .  ceFAZolin (ANCEF) IV 2 g (10/19/17 1308)   PRN Meds:.acetaminophen **OR** acetaminophen, bisacodyl, busPIRone, cyclobenzaprine, diclofenac  sodium, hydrOXYzine, ibuprofen, ondansetron **OR** ondansetron (ZOFRAN) IV, senna-docusate   PHYSICAL EXAM: Vital signs: Vitals:   10/18/17 0554 10/18/17 1452 10/18/17 2044 10/19/17 0614  BP: (!) 157/83 122/78 (!) 141/91 111/70  Pulse: 99 (!) 107 (!) 117 87  Resp: 18  16 16   Temp: 98.6 F (37 C) 98.1 F (36.7 C) 98.8 F (37.1 C) 98.4 F (36.9 C)  TempSrc: Oral Oral Oral Oral  SpO2: 98% 99% 100% 98%  Weight:      Height:       Filed Weights   10/02/17 1737 10/03/17 1455  Weight: 47.6 kg (105 lb) 47.6 kg (105 lb)   Body mass index is 19.84 kg/m.   General appearance :Awake, alert, not in any distress.  Eyes:, pupils equally reactive to light and accomodation,no scleral icterus. HEENT: Atraumatic and Normocephalic Neck: supple, no JVD. Resp:Good air entry bilaterally, no rales or rhonchi CVS: S1 S2 regular, no murmurs.  GI: Bowel sounds present, Non tender and not distended with no gaurding, rigidity or rebound. Extremities: B/L Lower Ext shows no edema, both legs are warm to touch Neurology:  speech clear,Non focal, sensation is grossly intact. Musculoskeletal:No digital cyanosis Skin:No Rash, warm and dry Wounds:N/A  I have personally reviewed following labs and imaging studies  LABORATORY DATA: CBC: Recent Labs  Lab 10/16/17 0446  WBC 7.9  HGB 9.9*  HCT 33.0*  MCV 78.9  PLT 433*    Basic Metabolic Panel: Recent Labs  Lab 10/16/17 0446  NA 139  K 3.3*  CL 105  CO2 26  GLUCOSE 101*  BUN 13  CREATININE 0.51  CALCIUM 8.8*    GFR: Estimated Creatinine Clearance: 77.3 mL/min (by C-G formula based on SCr of 0.51 mg/dL).  Liver Function Tests: No results for input(s): AST, ALT, ALKPHOS, BILITOT, PROT, ALBUMIN in the last 168 hours. No results for input(s): LIPASE, AMYLASE in the last 168 hours. No results for input(s): AMMONIA in the last 168 hours.  Coagulation Profile: No results for input(s): INR, PROTIME in the last 168 hours.  Cardiac  Enzymes: No results for input(s): CKTOTAL, CKMB, CKMBINDEX, TROPONINI in the last 168 hours.  BNP (last 3 results) No results for input(s): PROBNP in the last 8760 hours.  HbA1C: No results for input(s): HGBA1C in the last 72 hours.  CBG: No results for input(s): GLUCAP in the last 168 hours.  Lipid Profile: No results for input(s): CHOL, HDL, LDLCALC, TRIG, CHOLHDL, LDLDIRECT in the last 72 hours.  Thyroid Function Tests: No results for input(s): TSH, T4TOTAL, FREET4, T3FREE, THYROIDAB in the last 72 hours.  Anemia Panel: No results for input(s): VITAMINB12, FOLATE, FERRITIN, TIBC, IRON, RETICCTPCT in the last 72 hours.  Urine analysis:    Component Value Date/Time   COLORURINE YELLOW 10/02/2017 2117   APPEARANCEUR CLOUDY (A) 10/02/2017 2117   LABSPEC 1.025 10/02/2017 2117   PHURINE 5.0 10/02/2017 2117   GLUCOSEU NEGATIVE 10/02/2017 2117   HGBUR NEGATIVE 10/02/2017 2117   BILIRUBINUR NEGATIVE 10/02/2017 2117   [place] NEGATIVE 10/02/2017 2117   PROTEINUR NEGATIVE 10/02/2017 2117  UROBILINOGEN 0.2 10/15/2012 1341   NITRITE NEGATIVE 10/02/2017 2117   LEUKOCYTESUR NEGATIVE 10/02/2017 2117    Sepsis Labs: Lactic Acid, Venous    Component Value Date/Time   LATICACIDVEN 2.07 (HH) 01/27/2017 1336    MICROBIOLOGY: No results found for this or any previous visit (from the past 240 hour(s)).  RADIOLOGY STUDIES/RESULTS: Dg Chest 2 View  Result Date: 09/20/2017 CLINICAL DATA:  Fever and neck stiffness for 2 days. EXAM: CHEST - 2 VIEW COMPARISON:  PA and lateral chest 02/24/2017 and 01/24/2017. CT chest 01/27/2017. FINDINGS: The lungs are clear. Cavitary lesions in the right chest seen on prior examinations are not visualized today. No pneumothorax or pleural fluid. Heart size is normal. No bony abnormality. IMPRESSION: Negative chest. Electronically Signed   By: Inge Rise M.D.   On: 09/20/2017 09:23   Mr Cervical Spine Wo Contrast  Result Date:  09/20/2017 CLINICAL DATA:  Initial evaluation for possible epidural abscess. History of IVDU, endocarditis. EXAM: MRI CERVICAL SPINE WITHOUT CONTRAST TECHNIQUE: Multiplanar, multisequence MR imaging of the cervical spine was performed. No intravenous contrast was administered. COMPARISON:  None available. FINDINGS: Alignment: Examination limited as the patient was unable to tolerate the full length of the exam. No post-contrast sequences were performed. Additionally, imaging of the thorax was ordered but not performed due to patient's inability to continue. Vertebral bodies normally aligned with preservation of the normal cervical lordosis. No listhesis. Vertebrae: Vertebral body heights maintained without evidence for acute or chronic fracture. Bone marrow signal intensity diffusely decreased on T1 weighted imaging, most commonly related to anemia, smoking, obesity, or other chronic disease. No discrete or worrisome osseous lesions. No abnormal marrow edema to suggest acute osteomyelitis. Cord: Signal intensity within the cervical spinal cord is normal. No discernible epidural collections identified on this noncontrast examination. Posterior Fossa, vertebral arteries, paraspinal tissues: Visualized brain and posterior fossa within normal limits. Scattered mucosal thickening within the left maxillary sinus. There is abnormal prevertebral edema/effusion normal intravascular flow voids present within the vertebral arteries bilaterally. Adjacent longus coli musculature relatively normal in appearance. No other significant soft tissue edema or inflammatory changes within the visualized neck. Mildly prominent bilateral level 2 lymph nodes measure to the upper limits of normal at approximately 1 cm in short axis. Extending from C1 through C5, nonspecific, but could reflect sequelae of acute infection Disc levels: No significant disc pathology seen within the cervical spine. No findings to suggest acute discitis. No disc  bulge or disc protrusion. No significant canal or foraminal stenosis. IMPRESSION: 1. Limited study due to patient's inability to tolerate the full length of the exam. Noncontrast imaging of the cervical spinal E was performed. 2. Abnormal prevertebral edema extending from C1-2 inferiorly through C5. While this finding is nonspecific, this is most suspicious for possible infection given provided history. Source not entirely clear, as there are no other imaging findings to suggest osteomyelitis discitis on this exam. No discernible epidural abscess or other intraspinal infection. Further evaluation with dedicated neck CT may be helpful for further evaluation as clinically warranted. Electronically Signed   By: Jeannine Boga M.D.   On: 09/20/2017 20:06   Mr Thoracic Spine W Contrast  Result Date: 09/20/2017 CLINICAL DATA:  Initial evaluation for possible epidural abscess. History of IVDU, endocarditis. EXAM: MRI CERVICAL SPINE WITHOUT CONTRAST TECHNIQUE: Multiplanar, multisequence MR imaging of the cervical spine was performed. No intravenous contrast was administered. COMPARISON:  None available. FINDINGS: Alignment: Examination limited as the patient was unable to tolerate the full length  of the exam. No post-contrast sequences were performed. Additionally, imaging of the thorax was ordered but not performed due to patient's inability to continue. Vertebral bodies normally aligned with preservation of the normal cervical lordosis. No listhesis. Vertebrae: Vertebral body heights maintained without evidence for acute or chronic fracture. Bone marrow signal intensity diffusely decreased on T1 weighted imaging, most commonly related to anemia, smoking, obesity, or other chronic disease. No discrete or worrisome osseous lesions. No abnormal marrow edema to suggest acute osteomyelitis. Cord: Signal intensity within the cervical spinal cord is normal. No discernible epidural collections identified on this  noncontrast examination. Posterior Fossa, vertebral arteries, paraspinal tissues: Visualized brain and posterior fossa within normal limits. Scattered mucosal thickening within the left maxillary sinus. There is abnormal prevertebral edema/effusion normal intravascular flow voids present within the vertebral arteries bilaterally. Adjacent longus coli musculature relatively normal in appearance. No other significant soft tissue edema or inflammatory changes within the visualized neck. Mildly prominent bilateral level 2 lymph nodes measure to the upper limits of normal at approximately 1 cm in short axis. Extending from C1 through C5, nonspecific, but could reflect sequelae of acute infection Disc levels: No significant disc pathology seen within the cervical spine. No findings to suggest acute discitis. No disc bulge or disc protrusion. No significant canal or foraminal stenosis. IMPRESSION: 1. Limited study due to patient's inability to tolerate the full length of the exam. Noncontrast imaging of the cervical spinal E was performed. 2. Abnormal prevertebral edema extending from C1-2 inferiorly through C5. While this finding is nonspecific, this is most suspicious for possible infection given provided history. Source not entirely clear, as there are no other imaging findings to suggest osteomyelitis discitis on this exam. No discernible epidural abscess or other intraspinal infection. Further evaluation with dedicated neck CT may be helpful for further evaluation as clinically warranted. Electronically Signed   By: Jeannine Boga M.D.   On: 09/20/2017 20:06   Mr Lumbar Spine W Wo Contrast  Result Date: 09/29/2017 CLINICAL DATA:  32 y/o F; back pain with history of polysubstance/IV drug use, concern for infection. EXAM: MRI LUMBAR SPINE WITHOUT AND WITH CONTRAST TECHNIQUE: Multiplanar and multiecho pulse sequences of the lumbar spine were obtained without and with intravenous contrast. CONTRAST:  50mL  MULTIHANCE GADOBENATE DIMEGLUMINE 529 MG/ML IV SOLN COMPARISON:  02/07/2017 CT abdomen and pelvis. FINDINGS: Segmentation:  Standard. Alignment:  Physiologic. Vertebrae: No fracture, evidence of discitis, or bone lesion. No abnormal enhancement. Small L1 inferior endplate chronic Schmorl's node. Conus medullaris and cauda equina: Conus extends to the L1 level. Conus and cauda equina appear normal. No abnormal enhancement. Paraspinal and other soft tissues: 4 mm cyst in the lower pole of right kidney. Disc levels: No significant disc displacement, foraminal stenosis, or canal stenosis. IMPRESSION: Negative lumbar spine MRI.  No evidence of discitis osteomyelitis. Electronically Signed   By: [name] M.D.   On: 09/29/2017 22:21   Mr Knee Right Wo Contrast  Result Date: 10/05/2017 CLINICAL DATA:  Right knee pain and swelling. History of IV drug abuse. EXAM: MRI OF THE RIGHT KNEE WITHOUT CONTRAST TECHNIQUE: Multiplanar, multisequence MR imaging of the knee was performed. No intravenous contrast was administered. COMPARISON:  None. FINDINGS: MENISCI Medial meniscus:  Intact Lateral meniscus:  Intact LIGAMENTS Cruciates:  Intact.  Mucoid degeneration of the ACL. Collaterals:  Intact CARTILAGE Patellofemoral:  Normal Medial:  Normal Lateral:  Normal Joint:  No joint effusion or synovitis. Popliteal Fossa:  No popliteal mass or Baker's cyst. Extensor Mechanism:  The patella retinacular structures are intact and the quadriceps and patellar tendons are intact. Bones:  No acute bony findings.  No evidence of osteomyelitis. Other: Mild subcutaneous soft tissue swelling/edema/fluid suggesting cellulitis. No drainable soft tissue abscess. IMPRESSION: 1. No MR findings to suggest septic arthritis, osteomyelitis or drainable soft tissue abscess. 2. Mild diffuse cellulitis. 3. No findings for internal derangement. Electronically Signed   By: Marijo Sanes M.D.   On: 10/05/2017 10:26     LOS: 17 days   Oren Binet, MD  [facility] Pager:[phone]  If 7PM-7AM, please contact night-coverage [email or web address] Password [facility] 10/19/2017, 2:14 PM

## 2017-10-20 NOTE — Plan of Care (Signed)
  Problem: Health Behavior/Discharge Planning: Goal: Ability to manage health-related needs will improve Outcome: Progressing   Problem: Clinical Measurements: Goal: Ability to maintain clinical measurements within normal limits will improve Outcome: Progressing   Problem: Coping: Goal: Level of anxiety will decrease Outcome: Progressing   

## 2017-10-20 NOTE — Progress Notes (Signed)
PROGRESS NOTE        PATIENT DETAILS Name: Sherri Francis Age: 32 y.o. Sex: female Date of Birth: 1986/02/18 Admit Date: 10/02/2017 Admitting Physician Vianne Bulls, MD EUM:[id number], No Pcp Per  Brief Narrative: Sherri Francis is a 32 y.o. 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  She unfortunately left AMA on 4/16, and then got a self readmitted on 4/17.  A TEE on 4/22 showed resolution of her tricuspid regurgitation-she was resumed on cefazolin and started on Suboxone.  She unfortunately left AMA again on 4/27 and returned within 4 hours to resume treatment.  She currently remains on IV Ancef with plans for a 6-week therapy from 4/16.  Subjective: Lying comfortably in bed-awoke obesity when I walked in-claims menstrual cramps have improved.  Denies any chest pain or shortness of breath.  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  Repeat blood pressure on 4/23.  Not a candidate for outpatient IV antibiotic therapy given history of IVDA.  Plans are to remain inpatient until later this month she completes antimicrobial therapy  Heroin abuse with withdrawal symptoms: Withdrawal symptoms are controlled with Suboxone.  Mother at bedside on 5/12 indicated that family will take patient directly to a rehab facility in [place] from the hospital.  Pharmacy assistance in tapering off Suboxone appreciated.  Anxiety/depression: Stable-continue Effexor, low-dose propanolol for anxiety-continue BuSpar as needed.   Right knee pain: Seems to have resolved-MRI on 4/29- for effusion-no clinical signs of septic arthritis at this time.  Chronic hepatitis C: Stable for outpatient  follow-up and treatment when she follows with infectious disease  Amenorrhea: Pregnancy test negative-she finally had a menstrual cycle on 5/14 last menstrual cycle was this past October.  Defer further work-up to the outpatient setting.   DVT Prophylaxis: Prophylactic Lovenox  Code Status: Full code   Family Communication: None at bedside  Disposition Plan: Remain inpatient-we will remain inpatient until she completes a course of antimicrobial therapy-later this month.  Antimicrobial agents: Anti-infectives (From admission, onward)   Start     Dose/Rate Route Frequency Ordered Stop   10/04/17 1200  Oritavancin Diphosphate (ORBACTIV) 1,200 mg in dextrose 5 % IVPB     1,200 mg 333.3 mL/hr over 180 Minutes Intravenous Once 10/04/17 1108 10/04/17 1600   10/03/17 0600  ceFAZolin (ANCEF) IVPB 2g/100 mL premix     2 g 200 mL/hr over 30 Minutes Intravenous Every 8 hours 10/02/17 2311     10/02/17 2315  ceFAZolin (ANCEF) IVPB 1 g/50 mL premix     1 g 100 mL/hr over 30 Minutes Intravenous  Once 10/02/17 2312 10/03/17 0024   10/02/17 2130  ceFAZolin (ANCEF) IVPB 1 g/50 mL premix     1 g 100 mL/hr over 30 Minutes Intravenous  Once 10/02/17 2118 10/02/17 2224      Procedures: 4/22>> TEE  CONSULTS:  ID  Time spent: 25- minutes-Greater than 50% of this time was spent in counseling, explanation of diagnosis, planning of further management, and coordination of care.  MEDICATIONS: Scheduled Meds: . buprenorphine-naloxone  3 tablet Sublingual Daily   Followed by  . [START ON 10/23/2017] buprenorphine-naloxone  2 tablet Sublingual  Daily   Followed by  . [START ON 10/26/2017] buprenorphine-naloxone  1 tablet Sublingual Daily  . enoxaparin (LOVENOX) injection  40 mg Subcutaneous Daily  . gabapentin  200 mg Oral QHS  . gabapentin  400 mg Oral TID WC  . lidocaine  1 patch Transdermal Daily  . nicotine  21 mg Transdermal Daily  . propranolol  10 mg Oral BID  . venlafaxine XR  75 mg Oral  Q breakfast   Continuous Infusions: .  ceFAZolin (ANCEF) IV 2 g (10/20/17 0550)   PRN Meds:.acetaminophen **OR** acetaminophen, bisacodyl, busPIRone, cyclobenzaprine, diclofenac sodium, hydrOXYzine, ibuprofen, ondansetron **OR** ondansetron (ZOFRAN) IV, senna-docusate   PHYSICAL EXAM: Vital signs: Vitals:   10/19/17 1430 10/19/17 1432 10/19/17 2120 10/20/17 0526  BP: 99/85 107/70 116/74 102/62  Pulse: 92 81 96 74  Resp:   16 16  Temp: 98.2 F (36.8 C)  98.6 F (37 C) 97.7 F (36.5 C)  TempSrc: Oral  Oral Oral  SpO2: 100% 100% 100% 98%  Weight:      Height:       Filed Weights   10/02/17 1737 10/03/17 1455  Weight: 47.6 kg (105 lb) 47.6 kg (105 lb)   Body mass index is 19.84 kg/m.   General appearance :Awake, alert, not in any distress.  HEENT: Atraumatic and Normocephalic Neck: supple, no JVD. Resp:Good air entry bilaterally, no rales or rhonchi CVS: S1 S2 regular, no murmurs.  GI: Bowel sounds present, Non tender and not distended with no gaurding, rigidity or rebound. Extremities: B/L Lower Ext shows no edema, both legs are warm to touch Neurology:  speech clear,Non focal, sensation is grossly intact. Psychiatric: Normal judgment and insight. Normal mood. Musculoskeletal:No digital cyanosis Skin:No Rash, warm and dry Wounds:N/A  I have personally reviewed following labs and imaging studies  LABORATORY DATA: CBC: Recent Labs  Lab 10/16/17 0446  WBC 7.9  HGB 9.9*  HCT 33.0*  MCV 78.9  PLT 433*    Basic Metabolic Panel: Recent Labs  Lab 10/16/17 0446  NA 139  K 3.3*  CL 105  CO2 26  GLUCOSE 101*  BUN 13  CREATININE 0.51  CALCIUM 8.8*    GFR: Estimated Creatinine Clearance: 77.3 mL/min (by C-G formula based on SCr of 0.51 mg/dL).  Liver Function Tests: No results for input(s): AST, ALT, ALKPHOS, BILITOT, PROT, ALBUMIN in the last 168 hours. No results for input(s): LIPASE, AMYLASE in the last 168 hours. No results for input(s): AMMONIA in  the last 168 hours.  Coagulation Profile: No results for input(s): INR, PROTIME in the last 168 hours.  Cardiac Enzymes: No results for input(s): CKTOTAL, CKMB, CKMBINDEX, TROPONINI in the last 168 hours.  BNP (last 3 results) No results for input(s): PROBNP in the last 8760 hours.  HbA1C: No results for input(s): HGBA1C in the last 72 hours.  CBG: No results for input(s): GLUCAP in the last 168 hours.  Lipid Profile: No results for input(s): CHOL, HDL, LDLCALC, TRIG, CHOLHDL, LDLDIRECT in the last 72 hours.  Thyroid Function Tests: No results for input(s): TSH, T4TOTAL, FREET4, T3FREE, THYROIDAB in the last 72 hours.  Anemia Panel: No results for input(s): VITAMINB12, FOLATE, FERRITIN, TIBC, IRON, RETICCTPCT in the last 72 hours.  Urine analysis:    Component Value Date/Time   COLORURINE YELLOW 10/02/2017 2117   APPEARANCEUR CLOUDY (A) 10/02/2017 2117   LABSPEC 1.025 10/02/2017 2117   PHURINE 5.0 10/02/2017 2117   GLUCOSEU NEGATIVE 10/02/2017 2117   HGBUR NEGATIVE 10/02/2017 2117  BILIRUBINUR NEGATIVE 10/02/2017 2117   KETONESUR NEGATIVE 10/02/2017 2117   PROTEINUR NEGATIVE 10/02/2017 2117   UROBILINOGEN 0.2 10/15/2012 1341   NITRITE NEGATIVE 10/02/2017 2117   LEUKOCYTESUR NEGATIVE 10/02/2017 2117    Sepsis Labs: Lactic Acid, Venous    Component Value Date/Time   LATICACIDVEN 2.07 (HH) 01/27/2017 1336    MICROBIOLOGY: No results found for this or any previous visit (from the past 240 hour(s)).  RADIOLOGY STUDIES/RESULTS: Mr Cervical Spine Wo Contrast  Result Date: 09/20/2017 CLINICAL DATA:  Initial evaluation for possible epidural abscess. History of IVDU, endocarditis. EXAM: MRI CERVICAL SPINE WITHOUT CONTRAST TECHNIQUE: Multiplanar, multisequence MR imaging of the cervical spine was performed. No intravenous contrast was administered. COMPARISON:  None available. FINDINGS: Alignment: Examination limited as the patient was unable to tolerate the full length  of the exam. No post-contrast sequences were performed. Additionally, imaging of the thorax was ordered but not performed due to patient's inability to continue. Vertebral bodies normally aligned with preservation of the normal cervical lordosis. No listhesis. Vertebrae: Vertebral body heights maintained without evidence for acute or chronic fracture. Bone marrow signal intensity diffusely decreased on T1 weighted imaging, most commonly related to anemia, smoking, obesity, or other chronic disease. No discrete or worrisome osseous lesions. No abnormal marrow edema to suggest acute osteomyelitis. Cord: Signal intensity within the cervical spinal cord is normal. No discernible epidural collections identified on this noncontrast examination. Posterior Fossa, vertebral arteries, paraspinal tissues: Visualized brain and posterior fossa within normal limits. Scattered mucosal thickening within the left maxillary sinus. There is abnormal prevertebral edema/effusion normal intravascular flow voids present within the vertebral arteries bilaterally. Adjacent longus coli musculature relatively normal in appearance. No other significant soft tissue edema or inflammatory changes within the visualized neck. Mildly prominent bilateral level 2 lymph nodes measure to the upper limits of normal at approximately 1 cm in short axis. Extending from C1 through C5, nonspecific, but could reflect sequelae of acute infection Disc levels: No significant disc pathology seen within the cervical spine. No findings to suggest acute discitis. No disc bulge or disc protrusion. No significant canal or foraminal stenosis. IMPRESSION: 1. Limited study due to patient's inability to tolerate the full length of the exam. Noncontrast imaging of the cervical spinal E was performed. 2. Abnormal prevertebral edema extending from C1-2 inferiorly through C5. While this finding is nonspecific, this is most suspicious for possible infection given provided  history. Source not entirely clear, as there are no other imaging findings to suggest osteomyelitis discitis on this exam. No discernible epidural abscess or other intraspinal infection. Further evaluation with dedicated neck CT may be helpful for further evaluation as clinically warranted. Electronically Signed   By: Jeannine Boga M.D.   On: 09/20/2017 20:06   Mr Thoracic Spine W Contrast  Result Date: 09/20/2017 CLINICAL DATA:  Initial evaluation for possible epidural abscess. History of IVDU, endocarditis. EXAM: MRI CERVICAL SPINE WITHOUT CONTRAST TECHNIQUE: Multiplanar, multisequence MR imaging of the cervical spine was performed. No intravenous contrast was administered. COMPARISON:  None available. FINDINGS: Alignment: Examination limited as the patient was unable to tolerate the full length of the exam. No post-contrast sequences were performed. Additionally, imaging of the thorax was ordered but not performed due to patient's inability to continue. Vertebral bodies normally aligned with preservation of the normal cervical lordosis. No listhesis. Vertebrae: Vertebral body heights maintained without evidence for acute or chronic fracture. Bone marrow signal intensity diffusely decreased on T1 weighted imaging, most commonly related to anemia, smoking, obesity, or  other chronic disease. No discrete or worrisome osseous lesions. No abnormal marrow edema to suggest acute osteomyelitis. Cord: Signal intensity within the cervical spinal cord is normal. No discernible epidural collections identified on this noncontrast examination. Posterior Fossa, vertebral arteries, paraspinal tissues: Visualized brain and posterior fossa within normal limits. Scattered mucosal thickening within the left maxillary sinus. There is abnormal prevertebral edema/effusion normal intravascular flow voids present within the vertebral arteries bilaterally. Adjacent longus coli musculature relatively normal in appearance. No  other significant soft tissue edema or inflammatory changes within the visualized neck. Mildly prominent bilateral level 2 lymph nodes measure to the upper limits of normal at approximately 1 cm in short axis. Extending from C1 through C5, nonspecific, but could reflect sequelae of acute infection Disc levels: No significant disc pathology seen within the cervical spine. No findings to suggest acute discitis. No disc bulge or disc protrusion. No significant canal or foraminal stenosis. IMPRESSION: 1. Limited study due to patient's inability to tolerate the full length of the exam. Noncontrast imaging of the cervical spinal E was performed. 2. Abnormal prevertebral edema extending from C1-2 inferiorly through C5. While this finding is nonspecific, this is most suspicious for possible infection given provided history. Source not entirely clear, as there are no other imaging findings to suggest osteomyelitis discitis on this exam. No discernible epidural abscess or other intraspinal infection. Further evaluation with dedicated neck CT may be helpful for further evaluation as clinically warranted. Electronically Signed   By: Jeannine Boga M.D.   On: 09/20/2017 20:06   Mr Lumbar Spine W Wo Contrast  Result Date: 09/29/2017 CLINICAL DATA:  32 y/o F; back pain with history of polysubstance/IV drug use, concern for infection. EXAM: MRI LUMBAR SPINE WITHOUT AND WITH CONTRAST TECHNIQUE: Multiplanar and multiecho pulse sequences of the lumbar spine were obtained without and with intravenous contrast. CONTRAST:  67mL MULTIHANCE GADOBENATE DIMEGLUMINE 529 MG/ML IV SOLN COMPARISON:  02/07/2017 CT abdomen and pelvis. FINDINGS: Segmentation:  Standard. Alignment:  Physiologic. Vertebrae: No fracture, evidence of discitis, or bone lesion. No abnormal enhancement. Small L1 inferior endplate chronic Schmorl's node. Conus medullaris and cauda equina: Conus extends to the L1 level. Conus and cauda equina appear normal. No  abnormal enhancement. Paraspinal and other soft tissues: 4 mm cyst in the lower pole of right kidney. Disc levels: No significant disc displacement, foraminal stenosis, or canal stenosis. IMPRESSION: Negative lumbar spine MRI.  No evidence of discitis osteomyelitis. Electronically Signed   By: [name] M.D.   On: 09/29/2017 22:21   Mr Knee Right Wo Contrast  Result Date: 10/05/2017 CLINICAL DATA:  Right knee pain and swelling. History of IV drug abuse. EXAM: MRI OF THE RIGHT KNEE WITHOUT CONTRAST TECHNIQUE: Multiplanar, multisequence MR imaging of the knee was performed. No intravenous contrast was administered. COMPARISON:  None. FINDINGS: MENISCI Medial meniscus:  Intact Lateral meniscus:  Intact LIGAMENTS Cruciates:  Intact.  Mucoid degeneration of the ACL. Collaterals:  Intact CARTILAGE Patellofemoral:  Normal Medial:  Normal Lateral:  Normal Joint:  No joint effusion or synovitis. Popliteal Fossa:  No popliteal mass or Baker's cyst. Extensor Mechanism: The patella retinacular structures are intact and the quadriceps and patellar tendons are intact. Bones:  No acute bony findings.  No evidence of osteomyelitis. Other: Mild subcutaneous soft tissue swelling/edema/fluid suggesting cellulitis. No drainable soft tissue abscess. IMPRESSION: 1. No MR findings to suggest septic arthritis, osteomyelitis or drainable soft tissue abscess. 2. Mild diffuse cellulitis. 3. No findings for internal derangement. Electronically Signed  By: Marijo Sanes M.D.   On: 10/05/2017 10:26     LOS: 18 days   Oren Binet, MD  [facility] Pager:[phone]  If 7PM-7AM, please contact night-coverage [email or web address] Password [facility] 10/20/2017, 12:14 PM

## 2017-10-21 MED ORDER — TRAZODONE HCL 50 MG PO TABS
50.0000 mg | ORAL_TABLET | Freq: Every evening | ORAL | Status: DC | PRN
Start: 2017-10-21 — End: 2017-10-28

## 2017-10-21 MED ORDER — POLYETHYLENE GLYCOL 3350 17 G PO PACK
17.0000 g | PACK | Freq: Every day | ORAL | Status: DC
Start: 2017-10-21 — End: 2017-10-28
  Filled 2017-10-21 (×6): qty 1

## 2017-10-21 NOTE — Progress Notes (Signed)
PROGRESS NOTE        PATIENT DETAILS Name: Sherri Francis Age: 32 y.o. Sex: female Date of Birth: 1985-10-18 Admit Date: 10/02/2017 Admitting Physician Vianne Bulls, MD ASN:[id number], No Pcp Per  Brief Narrative: [name] is a 32 y.o. 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  She unfortunately left AMA on 4/16, and then got a self readmitted on 4/17.  A TEE on 4/22 showed resolution of her tricuspid regurgitation-she was resumed on cefazolin and started on Suboxone.  She unfortunately left AMA again on 4/27 and returned within 4 hours to resume treatment.  She currently remains on IV Ancef with plans for a 6-week therapy from 4/16.  Subjective: Menstrual cramps have markedly improved.  Mother at bedside.  No major issues overnight.  Asking for a sleeping aid.  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  Repeat blood pressure on 4/23.  Not a candidate for outpatient IV antibiotic therapy given history of IVDA.  Plans are to remain inpatient until later this month she completes antimicrobial therapy  Heroin abuse with withdrawal symptoms: Withdrawal symptoms are controlled with Suboxone.  Mother at bedside on 5/12 indicated that family will take patient directly to a rehab facility in [place] from the hospital.  Pharmacy assistance in tapering off Suboxone appreciated.  Anxiety/depression: Stable-continue Effexor, low-dose propanolol for anxiety-continue BuSpar as needed.   Right knee pain: Seems to have resolved-MRI on 4/29- for effusion-no clinical signs of septic arthritis at this time.  Chronic hepatitis C: Stable for outpatient follow-up and treatment when she  follows with infectious disease  Amenorrhea: Pregnancy test negative-she finally had a menstrual cycle on 5/14 last menstrual cycle was this past October.  Defer further work-up to the outpatient setting.   DVT Prophylaxis: Prophylactic Lovenox  Code Status: Full code   Family Communication: None at bedside  Disposition Plan: Remain inpatient-we will remain inpatient until she completes a course of antimicrobial therapy-later this month.  Antimicrobial agents: Anti-infectives (From admission, onward)   Start     Dose/Rate Route Frequency Ordered Stop   10/04/17 1200  Oritavancin Diphosphate (ORBACTIV) 1,200 mg in dextrose 5 % IVPB     1,200 mg 333.3 mL/hr over 180 Minutes Intravenous Once 10/04/17 1108 10/04/17 1600   10/03/17 0600  ceFAZolin (ANCEF) IVPB 2g/100 mL premix     2 g 200 mL/hr over 30 Minutes Intravenous Every 8 hours 10/02/17 2311     10/02/17 2315  ceFAZolin (ANCEF) IVPB 1 g/50 mL premix     1 g 100 mL/hr over 30 Minutes Intravenous  Once 10/02/17 2312 10/03/17 0024   10/02/17 2130  ceFAZolin (ANCEF) IVPB 1 g/50 mL premix     1 g 100 mL/hr over 30 Minutes Intravenous  Once 10/02/17 2118 10/02/17 2224      Procedures: 4/22>> TEE  CONSULTS:  ID  Time spent: 25- minutes-Greater than 50% of this time was spent in counseling, explanation of diagnosis, planning of further management, and coordination of care.  MEDICATIONS: Scheduled Meds: . buprenorphine-naloxone  3 tablet Sublingual Daily   Followed by  . [START ON 10/23/2017] buprenorphine-naloxone  2 tablet Sublingual Daily  Followed by  . [START ON 10/26/2017] buprenorphine-naloxone  1 tablet Sublingual Daily  . enoxaparin (LOVENOX) injection  40 mg Subcutaneous Daily  . gabapentin  200 mg Oral QHS  . gabapentin  400 mg Oral TID WC  . lidocaine  1 patch Transdermal Daily  . nicotine  21 mg Transdermal Daily  . propranolol  10 mg Oral BID  . venlafaxine XR  75 mg Oral Q breakfast   Continuous  Infusions: .  ceFAZolin (ANCEF) IV Stopped (10/21/17 1400)   PRN Meds:.acetaminophen **OR** acetaminophen, bisacodyl, busPIRone, cyclobenzaprine, diclofenac sodium, hydrOXYzine, ibuprofen, ondansetron **OR** ondansetron (ZOFRAN) IV, senna-docusate   PHYSICAL EXAM: Vital signs: Vitals:   10/20/17 1321 10/20/17 2037 10/21/17 0511 10/21/17 1328  BP: 130/88 123/80 128/85 130/89  Pulse: 84 (!) 112 (!) 116 (!) 115  Resp:  18  18  Temp: 97.8 F (36.6 C) 98.8 F (37.1 C) 98.3 F (36.8 C) 98.3 F (36.8 C)  TempSrc: Oral  Oral Oral  SpO2: 100% 99% 100% 100%  Weight:      Height:       Filed Weights   10/02/17 1737 10/03/17 1455  Weight: 47.6 kg (105 lb) 47.6 kg (105 lb)   Body mass index is 19.84 kg/m.   General appearance :Awake, alert, not in any distress.  HEENT: Atraumatic and Normocephalic Neck: supple, no JVD. Resp:Good air entry bilaterally, no rales or rhonchi CVS: S1 S2 regular, no murmurs.  GI: Bowel sounds present, Non tender and not distended with no gaurding, rigidity or rebound. Extremities: B/L Lower Ext shows no edema, both legs are warm to touch Neurology:  speech clear,Non focal, sensation is grossly intact. Psychiatric: Normal judgment and insight. Normal mood. Musculoskeletal:No digital cyanosis Skin:No Rash, warm and dry Wounds:N/A  I have personally reviewed following labs and imaging studies  LABORATORY DATA: CBC: Recent Labs  Lab 10/16/17 0446  WBC 7.9  HGB 9.9*  HCT 33.0*  MCV 78.9  PLT 433*    Basic Metabolic Panel: Recent Labs  Lab 10/16/17 0446  NA 139  K 3.3*  CL 105  CO2 26  GLUCOSE 101*  BUN 13  CREATININE 0.51  CALCIUM 8.8*    GFR: Estimated Creatinine Clearance: 77.3 mL/min (by C-G formula based on SCr of 0.51 mg/dL).  Liver Function Tests: No results for input(s): AST, ALT, ALKPHOS, BILITOT, PROT, ALBUMIN in the last 168 hours. No results for input(s): LIPASE, AMYLASE in the last 168 hours. No results for input(s):  AMMONIA in the last 168 hours.  Coagulation Profile: No results for input(s): INR, PROTIME in the last 168 hours.  Cardiac Enzymes: No results for input(s): CKTOTAL, CKMB, CKMBINDEX, TROPONINI in the last 168 hours.  BNP (last 3 results) No results for input(s): PROBNP in the last 8760 hours.  HbA1C: No results for input(s): HGBA1C in the last 72 hours.  CBG: No results for input(s): GLUCAP in the last 168 hours.  Lipid Profile: No results for input(s): CHOL, HDL, LDLCALC, TRIG, CHOLHDL, LDLDIRECT in the last 72 hours.  Thyroid Function Tests: No results for input(s): TSH, T4TOTAL, FREET4, T3FREE, THYROIDAB in the last 72 hours.  Anemia Panel: No results for input(s): VITAMINB12, FOLATE, FERRITIN, TIBC, IRON, RETICCTPCT in the last 72 hours.  Urine analysis:    Component Value Date/Time   COLORURINE YELLOW 10/02/2017 2117   APPEARANCEUR CLOUDY (A) 10/02/2017 2117   LABSPEC 1.025 10/02/2017 2117   PHURINE 5.0 10/02/2017 2117   GLUCOSEU NEGATIVE 10/02/2017 2117   HGBUR NEGATIVE 10/02/2017 2117  BILIRUBINUR NEGATIVE 10/02/2017 2117   KETONESUR NEGATIVE 10/02/2017 2117   PROTEINUR NEGATIVE 10/02/2017 2117   UROBILINOGEN 0.2 10/15/2012 1341   NITRITE NEGATIVE 10/02/2017 2117   LEUKOCYTESUR NEGATIVE 10/02/2017 2117    Sepsis Labs: Lactic Acid, Venous    Component Value Date/Time   LATICACIDVEN 2.07 (HH) 01/27/2017 1336    MICROBIOLOGY: No results found for this or any previous visit (from the past 240 hour(s)).  RADIOLOGY STUDIES/RESULTS: Mr Lumbar Spine W Wo Contrast  Result Date: 09/29/2017 CLINICAL DATA:  32 y/o F; back pain with history of polysubstance/IV drug use, concern for infection. EXAM: MRI LUMBAR SPINE WITHOUT AND WITH CONTRAST TECHNIQUE: Multiplanar and multiecho pulse sequences of the lumbar spine were obtained without and with intravenous contrast. CONTRAST:  11mL MULTIHANCE GADOBENATE DIMEGLUMINE 529 MG/ML IV SOLN COMPARISON:  02/07/2017 CT abdomen  and pelvis. FINDINGS: Segmentation:  Standard. Alignment:  Physiologic. Vertebrae: No fracture, evidence of discitis, or bone lesion. No abnormal enhancement. Small L1 inferior endplate chronic Schmorl's node. Conus medullaris and cauda equina: Conus extends to the L1 level. Conus and cauda equina appear normal. No abnormal enhancement. Paraspinal and other soft tissues: 4 mm cyst in the lower pole of right kidney. Disc levels: No significant disc displacement, foraminal stenosis, or canal stenosis. IMPRESSION: Negative lumbar spine MRI.  No evidence of discitis osteomyelitis. Electronically Signed   By: [name] M.D.   On: 09/29/2017 22:21   Mr Knee Right Wo Contrast  Result Date: 10/05/2017 CLINICAL DATA:  Right knee pain and swelling. History of IV drug abuse. EXAM: MRI OF THE RIGHT KNEE WITHOUT CONTRAST TECHNIQUE: Multiplanar, multisequence MR imaging of the knee was performed. No intravenous contrast was administered. COMPARISON:  None. FINDINGS: MENISCI Medial meniscus:  Intact Lateral meniscus:  Intact LIGAMENTS Cruciates:  Intact.  Mucoid degeneration of the ACL. Collaterals:  Intact CARTILAGE Patellofemoral:  Normal Medial:  Normal Lateral:  Normal Joint:  No joint effusion or synovitis. Popliteal Fossa:  No popliteal mass or Baker's cyst. Extensor Mechanism: The patella retinacular structures are intact and the quadriceps and patellar tendons are intact. Bones:  No acute bony findings.  No evidence of osteomyelitis. Other: Mild subcutaneous soft tissue swelling/edema/fluid suggesting cellulitis. No drainable soft tissue abscess. IMPRESSION: 1. No MR findings to suggest septic arthritis, osteomyelitis or drainable soft tissue abscess. 2. Mild diffuse cellulitis. 3. No findings for internal derangement. Electronically Signed   By: Marijo Sanes M.D.   On: 10/05/2017 10:26     LOS: 56 days   Oren Binet, MD  [facility] Pager:[phone]  If 7PM-7AM, please contact  night-coverage [email or web address] Password [facility] 10/21/2017, 2:36 PM

## 2017-10-22 NOTE — Social Work (Signed)
CSW remains available for consult, aware pt is on abx until 5/27.   Alexander Mt, [place] Work [phone]

## 2017-10-22 NOTE — Progress Notes (Signed)
PROGRESS NOTE        PATIENT DETAILS Name: Sherri Francis Age: 32 y.o. Sex: female Date of Birth: [birth year] Admit Date: 10/02/2017 Admitting Physician Vianne Bulls, MD NID:[id number], No Pcp Per  Brief Narrative: Sherri Francis is a 32 y.o. 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  She unfortunately left AMA on 4/16, and then got a self readmitted on 4/17.  A TEE on 4/22 showed resolution of her tricuspid regurgitation-she was resumed on cefazolin and started on Suboxone.  She unfortunately left AMA again on 4/27 and returned within 4 hours to resume treatment.  She currently remains on IV Ancef with plans for a 6-week therapy from 4/16.  Subjective: Ambulating in the room/bathroom this am. Denies any chest pain or SOB.   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  Repeat blood pressure on 4/23.  Not a candidate for outpatient IV antibiotic therapy given history of IVDA.  Plans are to remain inpatient until later this month she completes antimicrobial therapy  Heroin abuse with withdrawal symptoms: Withdrawal symptoms are controlled with Suboxone.  Mother at bedside on 5/12 indicated that family will take patient directly to a rehab facility in [place] from the hospital.  Pharmacy assistance in tapering off Suboxone appreciated.  Anxiety/depression: Stable-continue Effexor, low-dose propanolol for anxiety-continue BuSpar as needed.   Right knee pain: Seems to have resolved-MRI on 4/29- for effusion-no clinical signs of septic arthritis at this time.  Chronic hepatitis C: Stable for outpatient follow-up and treatment when she follows with infectious  disease  Amenorrhea: Pregnancy test negative-she finally had a menstrual cycle on 5/14 last menstrual cycle was this past October.  Defer further work-up to the outpatient setting.   DVT Prophylaxis: Prophylactic Lovenox  Code Status: Full code   Family Communication: None at bedside  Disposition Plan: Remain inpatient-we will remain inpatient until she completes a course of antimicrobial therapy-later this month.  Antimicrobial agents: Anti-infectives (From admission, onward)   Start     Dose/Rate Route Frequency Ordered Stop   10/04/17 1200  Oritavancin Diphosphate (ORBACTIV) 1,200 mg in dextrose 5 % IVPB     1,200 mg 333.3 mL/hr over 180 Minutes Intravenous Once 10/04/17 1108 10/04/17 1600   10/03/17 0600  ceFAZolin (ANCEF) IVPB 2g/100 mL premix     2 g 200 mL/hr over 30 Minutes Intravenous Every 8 hours 10/02/17 2311     10/02/17 2315  ceFAZolin (ANCEF) IVPB 1 g/50 mL premix     1 g 100 mL/hr over 30 Minutes Intravenous  Once 10/02/17 2312 10/03/17 0024   10/02/17 2130  ceFAZolin (ANCEF) IVPB 1 g/50 mL premix     1 g 100 mL/hr over 30 Minutes Intravenous  Once 10/02/17 2118 10/02/17 2224      Procedures: 4/22>> TEE  CONSULTS:  ID  Time spent: 25- minutes-Greater than 50% of this time was spent in counseling, explanation of diagnosis, planning of further management, and coordination of care.  MEDICATIONS: Scheduled Meds: . [START ON 10/23/2017] buprenorphine-naloxone  2 tablet Sublingual Daily   Followed by  . [START ON 10/26/2017] buprenorphine-naloxone  1 tablet Sublingual Daily  . enoxaparin (LOVENOX) injection  40 mg Subcutaneous Daily  . gabapentin  200 mg Oral QHS  . gabapentin  400 mg Oral TID WC  . lidocaine  1 patch Transdermal Daily  . nicotine  21 mg Transdermal Daily  . polyethylene glycol  17 g Oral Daily  . propranolol  10 mg Oral BID  . venlafaxine XR  75 mg Oral Q breakfast   Continuous Infusions: .  ceFAZolin (ANCEF) IV Stopped (10/22/17  0729)   PRN Meds:.acetaminophen **OR** acetaminophen, bisacodyl, busPIRone, cyclobenzaprine, diclofenac sodium, hydrOXYzine, ibuprofen, ondansetron **OR** ondansetron (ZOFRAN) IV, senna-docusate, traZODone   PHYSICAL EXAM: Vital signs: Vitals:   10/21/17 0511 10/21/17 1328 10/21/17 2128 10/22/17 0532  BP: 128/85 130/89 (!) 141/89 123/75  Pulse: (!) 116 (!) 115 (!) 111 (!) 107  Resp:  18 18 17   Temp: 98.3 F (36.8 C) 98.3 F (36.8 C) 98.2 F (36.8 C) 98.2 F (36.8 C)  TempSrc: Oral Oral Oral Oral  SpO2: 100% 100% 100% 98%  Weight:      Height:       Filed Weights   10/02/17 1737 10/03/17 1455  Weight: 47.6 kg (105 lb) 47.6 kg (105 lb)   Body mass index is 19.84 kg/m.   General appearance :Awake, alert, not in any distress.  HEENT: Atraumatic and Normocephalic Neck: supple, no JVD. Resp:Good air entry bilaterally, no rales or rhonchi CVS: S1 S2 regular, no murmurs.  GI: Bowel sounds present, Non tender and not distended with no gaurding, rigidity or rebound. Extremities: B/L Lower Ext shows no edema, both legs are warm to touch Neurology:  speech clear,Non focal, sensation is grossly intact. Psychiatric: Normal judgment and insight. Normal mood. Musculoskeletal:No digital cyanosis Skin:No Rash, warm and dry Wounds:N/A  I have personally reviewed following labs and imaging studies  LABORATORY DATA: CBC: Recent Labs  Lab 10/16/17 0446  WBC 7.9  HGB 9.9*  HCT 33.0*  MCV 78.9  PLT 433*    Basic Metabolic Panel: Recent Labs  Lab 10/16/17 0446  NA 139  K 3.3*  CL 105  CO2 26  GLUCOSE 101*  BUN 13  CREATININE 0.51  CALCIUM 8.8*    GFR: Estimated Creatinine Clearance: 77.3 mL/min (by C-G formula based on SCr of 0.51 mg/dL).  Liver Function Tests: No results for input(s): AST, ALT, ALKPHOS, BILITOT, PROT, ALBUMIN in the last 168 hours. No results for input(s): LIPASE, AMYLASE in the last 168 hours. No results for input(s): AMMONIA in the last 168  hours.  Coagulation Profile: No results for input(s): INR, PROTIME in the last 168 hours.  Cardiac Enzymes: No results for input(s): CKTOTAL, CKMB, CKMBINDEX, TROPONINI in the last 168 hours.  BNP (last 3 results) No results for input(s): PROBNP in the last 8760 hours.  HbA1C: No results for input(s): HGBA1C in the last 72 hours.  CBG: No results for input(s): GLUCAP in the last 168 hours.  Lipid Profile: No results for input(s): CHOL, HDL, LDLCALC, TRIG, CHOLHDL, LDLDIRECT in the last 72 hours.  Thyroid Function Tests: No results for input(s): TSH, T4TOTAL, FREET4, T3FREE, THYROIDAB in the last 72 hours.  Anemia Panel: No results for input(s): VITAMINB12, FOLATE, FERRITIN, TIBC, IRON, RETICCTPCT in the last 72 hours.  Urine analysis:    Component Value Date/Time   COLORURINE YELLOW 10/02/2017 2117   APPEARANCEUR CLOUDY (A) 10/02/2017 2117   LABSPEC 1.025 10/02/2017 2117   PHURINE 5.0 10/02/2017 2117   GLUCOSEU NEGATIVE 10/02/2017 2117   HGBUR NEGATIVE 10/02/2017 2117   [place] NEGATIVE 10/02/2017 2117  KETONESUR NEGATIVE 10/02/2017 2117   PROTEINUR NEGATIVE 10/02/2017 2117   UROBILINOGEN 0.2 10/15/2012 1341   NITRITE NEGATIVE 10/02/2017 2117   LEUKOCYTESUR NEGATIVE 10/02/2017 2117    Sepsis Labs: Lactic Acid, Venous    Component Value Date/Time   LATICACIDVEN 2.07 (HH) 01/27/2017 1336    MICROBIOLOGY: No results found for this or any previous visit (from the past 240 hour(s)).  RADIOLOGY STUDIES/RESULTS: Mr Lumbar Spine W Wo Contrast  Result Date: 09/29/2017 CLINICAL DATA:  32 y/o F; back pain with history of polysubstance/IV drug use, concern for infection. EXAM: MRI LUMBAR SPINE WITHOUT AND WITH CONTRAST TECHNIQUE: Multiplanar and multiecho pulse sequences of the lumbar spine were obtained without and with intravenous contrast. CONTRAST:  48mL MULTIHANCE GADOBENATE DIMEGLUMINE 529 MG/ML IV SOLN COMPARISON:  02/07/2017 CT abdomen and pelvis. FINDINGS:  Segmentation:  Standard. Alignment:  Physiologic. Vertebrae: No fracture, evidence of discitis, or bone lesion. No abnormal enhancement. Small L1 inferior endplate chronic Schmorl's node. Conus medullaris and cauda equina: Conus extends to the L1 level. Conus and cauda equina appear normal. No abnormal enhancement. Paraspinal and other soft tissues: 4 mm cyst in the lower pole of right kidney. Disc levels: No significant disc displacement, foraminal stenosis, or canal stenosis. IMPRESSION: Negative lumbar spine MRI.  No evidence of discitis osteomyelitis. Electronically Signed   By: [name] M.D.   On: 09/29/2017 22:21   Mr Knee Right Wo Contrast  Result Date: 10/05/2017 CLINICAL DATA:  Right knee pain and swelling. History of IV drug abuse. EXAM: MRI OF THE RIGHT KNEE WITHOUT CONTRAST TECHNIQUE: Multiplanar, multisequence MR imaging of the knee was performed. No intravenous contrast was administered. COMPARISON:  None. FINDINGS: MENISCI Medial meniscus:  Intact Lateral meniscus:  Intact LIGAMENTS Cruciates:  Intact.  Mucoid degeneration of the ACL. Collaterals:  Intact CARTILAGE Patellofemoral:  Normal Medial:  Normal Lateral:  Normal Joint:  No joint effusion or synovitis. Popliteal Fossa:  No popliteal mass or Baker's cyst. Extensor Mechanism: The patella retinacular structures are intact and the quadriceps and patellar tendons are intact. Bones:  No acute bony findings.  No evidence of osteomyelitis. Other: Mild subcutaneous soft tissue swelling/edema/fluid suggesting cellulitis. No drainable soft tissue abscess. IMPRESSION: 1. No MR findings to suggest septic arthritis, osteomyelitis or drainable soft tissue abscess. 2. Mild diffuse cellulitis. 3. No findings for internal derangement. Electronically Signed   By: Marijo Sanes M.D.   On: 10/05/2017 10:26     LOS: 20 days   Oren Binet, MD  [facility] Pager:[phone]  If 7PM-7AM, please contact  night-coverage [email or web address] Password [email or web address] 10/22/2017, 11:59 AM

## 2017-10-22 NOTE — Progress Notes (Addendum)
Pt. was in the bathroom taking shower and stayed more than 45 mints.when the nurse went in to the room to check on pt. and  to give med's pt. was coming from the bath room  bleeding from two sites,from the back of lower left leg and the other higher than the other one.Pt.stated that she cut her self while shaving.The nurse clean the blood,and did dry guaze  dressing.

## 2017-10-23 LAB — RAPID URINE DRUG SCREEN, HOSP PERFORMED
AMPHETAMINES: POSITIVE — AB
BARBITURATES: NOT DETECTED
BENZODIAZEPINES: NOT DETECTED
Cocaine: NOT DETECTED
Opiates: NOT DETECTED
TETRAHYDROCANNABINOL: NOT DETECTED

## 2017-10-23 LAB — CBC
HEMATOCRIT: 36.2 % (ref 36.0–46.0)
Hemoglobin: 11.1 g/dL — ABNORMAL LOW (ref 12.0–15.0)
MCH: 24.2 pg — AB (ref 26.0–34.0)
MCHC: 30.7 g/dL (ref 30.0–36.0)
MCV: 78.9 fL (ref 78.0–100.0)
Platelets: 511 10*3/uL — ABNORMAL HIGH (ref 150–400)
RBC: 4.59 MIL/uL (ref 3.87–5.11)
RDW: 16.7 % — ABNORMAL HIGH (ref 11.5–15.5)
WBC: 6 10*3/uL (ref 4.0–10.5)

## 2017-10-23 LAB — BASIC METABOLIC PANEL
Anion gap: 8 (ref 5–15)
BUN: 17 mg/dL (ref 6–20)
CHLORIDE: 107 mmol/L (ref 101–111)
CO2: 27 mmol/L (ref 22–32)
Calcium: 8.9 mg/dL (ref 8.9–10.3)
Creatinine, Ser: 0.55 mg/dL (ref 0.44–1.00)
GFR calc Af Amer: >60 mL/min (ref >60)
GFR calc non Af Amer: >60 mL/min (ref >60)
Glucose, Bld: 93 mg/dL (ref 65–99)
POTASSIUM: 3.9 mmol/L (ref 3.5–5.1)
SODIUM: 142 mmol/L (ref 135–145)

## 2017-10-23 MED ORDER — ZOLPIDEM TARTRATE 5 MG PO TABS: 5.0000 mg | ORAL_TABLET | Freq: Every evening | ORAL | Status: DC | PRN

## 2017-10-23 NOTE — Progress Notes (Signed)
Pt refusing blood draw at this time, educated. Will have phlebotomist to try again later.

## 2017-10-23 NOTE — Progress Notes (Signed)
MEDICATION RELATED CONSULT NOTE - FOLLOW UP   Pharmacy Consult:  Suboxone tapering  No Known Allergies  Patient Measurements: Height: 5\' 1"  (154.9 cm) Weight: 105 lb (47.6 kg) IBW/kg (Calculated) : 47.8  Vital Signs: Temp: 98.2 F (36.8 C) (05/18 0634) Temp Source: Oral (05/18 0634) BP: 86/57 (05/18 2863) Pulse Rate: 82 (05/18 8177)   Assessment: 78 YOF with history of IVDU, hepatitis C and MSSA bacteremia/endocarditis to undergo Suboxone taper so she could be admitted into Rehab.  Suboxone requires slow tapering to avoid withdrawal and current plan is to taper over 30 days, reducing by increments of 2mg  every 3-5 days.  Patient has tolerated tapering thus far without withdrawal symptoms.  Doing very well per patient.  Stop date for antibiotics ~11/01/17 so need to lower Suboxone dose for discharge.    Plan:  Suboxone 6-1.5mg  SL daily for 3 days, 4-1mg  SL daily for 3 days, 2-0.5mg  SL daily for 3 days, then off Propranolol 10mg  PO BID + PRN hydroxyzine for anxiety Pharmacy will sign off.  Thank you for the consult!   Janaki Exley D. Mina Marble, PharmD, BCPS, BCCCP Pager:  [phone] 10/23/2017, 8:47 AM

## 2017-10-23 NOTE — Progress Notes (Signed)
PROGRESS NOTE        PATIENT DETAILS Name: Sherri Francis Age: 32 y.o. Sex: female Date of Birth: June 28, 1985 Admit Date: 10/02/2017 Admitting Physician Vianne Bulls, MD IDP:[id number], No Pcp Per  Brief Narrative: Mrs. Flatley is a 32 y.o. 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  She unfortunately left AMA on 4/16, and then got a self readmitted on 4/17.  A TEE on 4/22 showed resolution of her tricuspid regurgitation-she was resumed on cefazolin and started on Suboxone.  She unfortunately left AMA again on 4/27 and returned within 4 hours to resume treatment.  She currently remains on IV Ancef with plans for a 6-week therapy from 4/16.  Subjective: Sleeping-appears restless.  Per RN-patient had a "friend" sleep in the room last night-which she became restless-patient denies drug use while in the hospital.    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  Repeat blood pressure on 4/23.  Not a candidate for outpatient IV antibiotic therapy given history of IVDA.  Plans are to remain inpatient until later this month she completes antimicrobial therapy  Heroin abuse with withdrawal symptoms: Withdrawal symptoms will control with Suboxone-this morning she appears more restless/very fidgety-nursing staff concerned that she may be getting illicit drugs 1 some of her friends visit her.  Patient denies when I asked her this morning-check urine drug screen-have warned patient regarding the dangers of polypharmacy if indeed she is abusing drugs in the hospital.  Per nursing staff, patient's mother will now stay in the hospital 24/7.    Anxiety/depression: Was stable-but more jittery and  anxious today-continue daily Effexor, propranolol and BuSpar as needed.  Right knee pain: Seems to have resolved-MRI on 4/29- for effusion-no clinical signs of septic arthritis at this time.  Chronic hepatitis C: Stable for outpatient follow-up and treatment when she follows with infectious disease  Amenorrhea: Pregnancy test negative-she finally had a menstrual cycle on 5/14 last menstrual cycle was this past October.  Defer further work-up to the outpatient setting.   Insomnia: Continue prn trazodone-which patient claims has no effect on her-add prn low-dose Ambien to see if this will help her.  DVT Prophylaxis: Prophylactic Lovenox  Code Status: Full code   Family Communication: None at bedside  Disposition Plan: Remain inpatient-we will remain inpatient until she completes a course of antimicrobial therapy-later this month.  Antimicrobial agents: Anti-infectives (From admission, onward)   Start     Dose/Rate Route Frequency Ordered Stop   10/04/17 1200  Oritavancin Diphosphate (ORBACTIV) 1,200 mg in dextrose 5 % IVPB     1,200 mg 333.3 mL/hr over 180 Minutes Intravenous Once 10/04/17 1108 10/04/17 1600   10/03/17 0600  ceFAZolin (ANCEF) IVPB 2g/100 mL premix     2 g 200 mL/hr over 30 Minutes Intravenous Every 8 hours 10/02/17 2311     10/02/17 2315  ceFAZolin (ANCEF) IVPB 1 g/50 mL premix     1 g 100 mL/hr over 30 Minutes Intravenous  Once 10/02/17 2312 10/03/17 0024   10/02/17 2130  ceFAZolin (ANCEF) IVPB 1 g/50 mL premix     1 g 100 mL/hr over 30 Minutes Intravenous  Once 10/02/17  2118 10/02/17 2224      Procedures: 4/22>> TEE  CONSULTS:  ID  Time spent: 25- minutes-Greater than 50% of this time was spent in counseling, explanation of diagnosis, planning of further management, and coordination of care.  MEDICATIONS: Scheduled Meds: . buprenorphine-naloxone  2 tablet Sublingual Daily   Followed by  . [START ON 10/26/2017] buprenorphine-naloxone  1 tablet  Sublingual Daily  . enoxaparin (LOVENOX) injection  40 mg Subcutaneous Daily  . gabapentin  200 mg Oral QHS  . gabapentin  400 mg Oral TID WC  . lidocaine  1 patch Transdermal Daily  . nicotine  21 mg Transdermal Daily  . polyethylene glycol  17 g Oral Daily  . propranolol  10 mg Oral BID  . venlafaxine XR  75 mg Oral Q breakfast   Continuous Infusions: .  ceFAZolin (ANCEF) IV Stopped (10/23/17 0647)   PRN Meds:.acetaminophen **OR** acetaminophen, bisacodyl, busPIRone, cyclobenzaprine, diclofenac sodium, hydrOXYzine, ibuprofen, ondansetron **OR** ondansetron (ZOFRAN) IV, senna-docusate, traZODone, zolpidem   PHYSICAL EXAM: Vital signs: Vitals:   10/22/17 1634 10/22/17 2102 10/23/17 0634 10/23/17 1312  BP: 115/68 (!) 94/54 (!) 86/57 114/65  Pulse: (!) 109 (!) 104 82 93  Resp: 18 17 16    Temp: 98.1 F (36.7 C) 97.9 F (36.6 C) 98.2 F (36.8 C)   TempSrc: Oral Oral Oral   SpO2: 98% 100% 99% 100%  Weight:      Height:       Filed Weights   10/02/17 1737 10/03/17 1455  Weight: 47.6 kg (105 lb) 47.6 kg (105 lb)   Body mass index is 19.84 kg/m.   General appearance:Awake, alert, jittery/restless.  Not in any distress.  Eyes:no scleral icterus. HEENT: Atraumatic and Normocephalic Neck: supple, no JVD. Resp:Good air entry bilaterally,no rales or rhonchi CVS: S1 S2 regular, no murmurs.  GI: Bowel sounds present, Non tender and not distended with no gaurding, rigidity or rebound. Extremities: B/L Lower Ext shows no edema, both legs are warm to touch Neurology:  Non focal Psychiatric: Normal judgment and insight. Normal mood. Musculoskeletal:No digital cyanosis Skin:No Rash, warm and dry Wounds:N/A  I have personally reviewed following labs and imaging studies  LABORATORY DATA: CBC: Recent Labs  Lab 10/23/17 0734  WBC 6.0  HGB 11.1*  HCT 36.2  MCV 78.9  PLT 511*    Basic Metabolic Panel: Recent Labs  Lab 10/23/17 0734  NA 142  K 3.9  CL 107  CO2 27    GLUCOSE 93  BUN 17  CREATININE 0.55  CALCIUM 8.9    GFR: Estimated Creatinine Clearance: 77.3 mL/min (by C-G formula based on SCr of 0.55 mg/dL).  Liver Function Tests: No results for input(s): AST, ALT, ALKPHOS, BILITOT, PROT, ALBUMIN in the last 168 hours. No results for input(s): LIPASE, AMYLASE in the last 168 hours. No results for input(s): AMMONIA in the last 168 hours.  Coagulation Profile: No results for input(s): INR, PROTIME in the last 168 hours.  Cardiac Enzymes: No results for input(s): CKTOTAL, CKMB, CKMBINDEX, TROPONINI in the last 168 hours.  BNP (last 3 results) No results for input(s): PROBNP in the last 8760 hours.  HbA1C: No results for input(s): HGBA1C in the last 72 hours.  CBG: No results for input(s): GLUCAP in the last 168 hours.  Lipid Profile: No results for input(s): CHOL, HDL, LDLCALC, TRIG, CHOLHDL, LDLDIRECT in the last 72 hours.  Thyroid Function Tests: No results for input(s): TSH, T4TOTAL, FREET4, T3FREE, THYROIDAB in the last 72 hours.  Anemia Panel:  No results for input(s): VITAMINB12, FOLATE, FERRITIN, TIBC, IRON, RETICCTPCT in the last 72 hours.  Urine analysis:    Component Value Date/Time   COLORURINE YELLOW 10/02/2017 2117   APPEARANCEUR CLOUDY (A) 10/02/2017 2117   LABSPEC 1.025 10/02/2017 2117   PHURINE 5.0 10/02/2017 2117   GLUCOSEU NEGATIVE 10/02/2017 2117   HGBUR NEGATIVE 10/02/2017 2117   BILIRUBINUR NEGATIVE 10/02/2017 2117   KETONESUR NEGATIVE 10/02/2017 2117   PROTEINUR NEGATIVE 10/02/2017 2117   UROBILINOGEN 0.2 10/15/2012 1341   NITRITE NEGATIVE 10/02/2017 2117   LEUKOCYTESUR NEGATIVE 10/02/2017 2117    Sepsis Labs: Lactic Acid, Venous    Component Value Date/Time   LATICACIDVEN 2.07 (HH) 01/27/2017 1336    MICROBIOLOGY: No results found for this or any previous visit (from the past 240 hour(s)).  RADIOLOGY STUDIES/RESULTS: Mr Lumbar Spine W Wo Contrast  Result Date: 09/29/2017 CLINICAL DATA:  32  y/o F; back pain with history of polysubstance/IV drug use, concern for infection. EXAM: MRI LUMBAR SPINE WITHOUT AND WITH CONTRAST TECHNIQUE: Multiplanar and multiecho pulse sequences of the lumbar spine were obtained without and with intravenous contrast. CONTRAST:  59mL MULTIHANCE GADOBENATE DIMEGLUMINE 529 MG/ML IV SOLN COMPARISON:  02/07/2017 CT abdomen and pelvis. FINDINGS: Segmentation:  Standard. Alignment:  Physiologic. Vertebrae: No fracture, evidence of discitis, or bone lesion. No abnormal enhancement. Small L1 inferior endplate chronic Schmorl's node. Conus medullaris and cauda equina: Conus extends to the L1 level. Conus and cauda equina appear normal. No abnormal enhancement. Paraspinal and other soft tissues: 4 mm cyst in the lower pole of right kidney. Disc levels: No significant disc displacement, foraminal stenosis, or canal stenosis. IMPRESSION: Negative lumbar spine MRI.  No evidence of discitis osteomyelitis. Electronically Signed   By: [name] M.D.   On: 09/29/2017 22:21   Mr Knee Right Wo Contrast  Result Date: 10/05/2017 CLINICAL DATA:  Right knee pain and swelling. History of IV drug abuse. EXAM: MRI OF THE RIGHT KNEE WITHOUT CONTRAST TECHNIQUE: Multiplanar, multisequence MR imaging of the knee was performed. No intravenous contrast was administered. COMPARISON:  None. FINDINGS: MENISCI Medial meniscus:  Intact Lateral meniscus:  Intact LIGAMENTS Cruciates:  Intact.  Mucoid degeneration of the ACL. Collaterals:  Intact CARTILAGE Patellofemoral:  Normal Medial:  Normal Lateral:  Normal Joint:  No joint effusion or synovitis. Popliteal Fossa:  No popliteal mass or Baker's cyst. Extensor Mechanism: The patella retinacular structures are intact and the quadriceps and patellar tendons are intact. Bones:  No acute bony findings.  No evidence of osteomyelitis. Other: Mild subcutaneous soft tissue swelling/edema/fluid suggesting cellulitis. No drainable soft tissue abscess.  IMPRESSION: 1. No MR findings to suggest septic arthritis, osteomyelitis or drainable soft tissue abscess. 2. Mild diffuse cellulitis. 3. No findings for internal derangement. Electronically Signed   By: Marijo Sanes M.D.   On: 10/05/2017 10:26     LOS: 21 days   Oren Binet, MD  [facility] Pager:[phone]  If 7PM-7AM, please contact night-coverage [email or web address] Password [email or web address] 10/23/2017, 1:45 PM

## 2017-10-24 NOTE — Progress Notes (Addendum)
PROGRESS NOTE        PATIENT DETAILS Name: Sherri Francis Age: 32 y.o. Sex: female Date of Birth: 06-18-1985 Admit Date: 10/02/2017 Admitting Physician Vianne Bulls, MD VPX:[id number], No Pcp Per  Brief Narrative: Sherri Francis is a 32 y.o. 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  She unfortunately left AMA on 4/16, and then got a self readmitted on 4/17.  A TEE on 4/22 showed resolution of her tricuspid regurgitation-she was resumed on cefazolin and started on Suboxone.  She unfortunately left AMA again on 4/27 and returned within 4 hours to resume treatment.  She currently remains on IV Ancef with plans for a 6-week therapy from 4/16.  Subjective: Still very fidgety and restless this morning-mother at bedside.  Patient did not deny using methamphetamine a few days back (while she is hospitalized).  Mother also thinks that she may have done some methamphetamine either on Friday or Thursday.  Urine drug screen was positive for methamphetamine.  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  Repeat blood pressure on 4/23.  Not a candidate for outpatient IV antibiotic therapy given history of IVDA.  Plans are to remain inpatient until later this month she completes antimicrobial therapy  Heroin abuse with withdrawal symptoms: Withdrawal symptoms being treated with Suboxone taper-it appears that her urine drug screen done on 5/18 was positive for methamphetamine-it appears that she may have done methamphetamine Thursday/Friday.  Counseled both patient and mother-that patient is at life-threatening and life disabling risk of reactions due to polypharmacy.  Spoke with charge RN,  bedside RN-and with patient's mother (who was very cooperative and understanding)-security will check patient's room for any illicit drugs/substances, if patient's mother has to leave the room-she will notify the nursing staff who can then use video monitoring/telemetry sitter if needed.  Anxiety/depression: Stable-continue Effexor, propranolol and BuSpar as needed.    Right knee pain: Seems to have resolved-MRI on 4/29- for effusion-no clinical signs of septic arthritis at this time.  Chronic hepatitis C: Stable for outpatient follow-up and treatment when she follows with infectious disease  Amenorrhea: Pregnancy test negative-she finally had a menstrual cycle on 5/14 last menstrual cycle was this past October.  Defer further work-up to the outpatient setting.   Insomnia: Continue prn trazodone-which patient claims has no effect on her-add prn low-dose Ambien to see if this will help her.  DVT Prophylaxis: Prophylactic Lovenox  Code Status: Full code   Family Communication: None at bedside  Disposition Plan: Remain inpatient-we will remain inpatient until she completes a course of antimicrobial therapy-later this month.  Antimicrobial agents: Anti-infectives (From admission, onward)   Start     Dose/Rate Route Frequency Ordered Stop   10/04/17 1200  Oritavancin Diphosphate (ORBACTIV) 1,200 mg in dextrose 5 % IVPB     1,200 mg 333.3 mL/hr over 180 Minutes Intravenous Once 10/04/17 1108 10/04/17 1600   10/03/17 0600  ceFAZolin (ANCEF) IVPB 2g/100 mL premix     2 g 200 mL/hr over 30 Minutes Intravenous Every 8 hours 10/02/17 2311     10/02/17 2315  ceFAZolin (ANCEF) IVPB 1 g/50 mL premix     1 g  100 mL/hr over 30 Minutes Intravenous  Once 10/02/17 2312 10/03/17 0024   10/02/17 2130  ceFAZolin (ANCEF) IVPB 1 g/50 mL premix     1 g 100 mL/hr over 30 Minutes Intravenous  Once 10/02/17 2118 10/02/17 2224      Procedures: 4/22>> TEE  CONSULTS:  ID  Time spent: 25-  minutes-Greater than 50% of this time was spent in counseling, explanation of diagnosis, planning of further management, and coordination of care.  MEDICATIONS: Scheduled Meds: . buprenorphine-naloxone  2 tablet Sublingual Daily   Followed by  . [START ON 10/26/2017] buprenorphine-naloxone  1 tablet Sublingual Daily  . enoxaparin (LOVENOX) injection  40 mg Subcutaneous Daily  . gabapentin  200 mg Oral QHS  . gabapentin  400 mg Oral TID WC  . lidocaine  1 patch Transdermal Daily  . nicotine  21 mg Transdermal Daily  . polyethylene glycol  17 g Oral Daily  . propranolol  10 mg Oral BID  . venlafaxine XR  75 mg Oral Q breakfast   Continuous Infusions: .  ceFAZolin (ANCEF) IV Stopped (10/24/17 0600)   PRN Meds:.acetaminophen **OR** acetaminophen, bisacodyl, busPIRone, cyclobenzaprine, diclofenac sodium, hydrOXYzine, ibuprofen, ondansetron **OR** ondansetron (ZOFRAN) IV, senna-docusate, traZODone, zolpidem   PHYSICAL EXAM: Vital signs: Vitals:   10/23/17 1312 10/23/17 2120 10/24/17 0642 10/24/17 1100  BP: 114/65 (!) 111/59 116/71 104/64  Pulse: 93 74 85 88  Resp:      Temp:  98.1 F (36.7 C) 98.3 F (36.8 C) 98.4 F (36.9 C)  TempSrc:  Oral Oral Oral  SpO2: 100% 98% 98% 99%  Weight:      Height:       Filed Weights   10/02/17 1737 10/03/17 1455  Weight: 47.6 kg (105 lb) 47.6 kg (105 lb)   Body mass index is 19.84 kg/m.   General appearance:Awake, alert, not in any distress.  Eyes:no scleral icterus. HEENT: Atraumatic and Normocephalic Neck: supple, no JVD. Resp:Good air entry bilaterally,no rales or rhonchi CVS: S1 S2 regular, no murmurs.  GI: Bowel sounds present, Non tender and not distended with no gaurding, rigidity or rebound. Extremities: B/L Lower Ext shows no edema, both legs are warm to touch Neurology:  Non focal Musculoskeletal:No digital cyanosis Skin:No Rash, warm and dry Wounds:N/A  I have personally reviewed following labs and imaging  studies  LABORATORY DATA: CBC: Recent Labs  Lab 10/23/17 0734  WBC 6.0  HGB 11.1*  HCT 36.2  MCV 78.9  PLT 511*    Basic Metabolic Panel: Recent Labs  Lab 10/23/17 0734  NA 142  K 3.9  CL 107  CO2 27  GLUCOSE 93  BUN 17  CREATININE 0.55  CALCIUM 8.9    GFR: Estimated Creatinine Clearance: 77.3 mL/min (by C-G formula based on SCr of 0.55 mg/dL).  Liver Function Tests: No results for input(s): AST, ALT, ALKPHOS, BILITOT, PROT, ALBUMIN in the last 168 hours. No results for input(s): LIPASE, AMYLASE in the last 168 hours. No results for input(s): AMMONIA in the last 168 hours.  Coagulation Profile: No results for input(s): INR, PROTIME in the last 168 hours.  Cardiac Enzymes: No results for input(s): CKTOTAL, CKMB, CKMBINDEX, TROPONINI in the last 168 hours.  BNP (last 3 results) No results for input(s): PROBNP in the last 8760 hours.  HbA1C: No results for input(s): HGBA1C in the last 72 hours.  CBG: No results for input(s): GLUCAP in the last 168 hours.  Lipid Profile: No results for input(s): CHOL, HDL, LDLCALC, TRIG, CHOLHDL, LDLDIRECT  in the last 72 hours.  Thyroid Function Tests: No results for input(s): TSH, T4TOTAL, FREET4, T3FREE, THYROIDAB in the last 72 hours.  Anemia Panel: No results for input(s): VITAMINB12, FOLATE, FERRITIN, TIBC, IRON, RETICCTPCT in the last 72 hours.  Urine analysis:    Component Value Date/Time   COLORURINE YELLOW 10/02/2017 2117   APPEARANCEUR CLOUDY (A) 10/02/2017 2117   LABSPEC 1.025 10/02/2017 2117   PHURINE 5.0 10/02/2017 2117   GLUCOSEU NEGATIVE 10/02/2017 2117   HGBUR NEGATIVE 10/02/2017 2117   BILIRUBINUR NEGATIVE 10/02/2017 2117   KETONESUR NEGATIVE 10/02/2017 2117   PROTEINUR NEGATIVE 10/02/2017 2117   UROBILINOGEN 0.2 10/15/2012 1341   NITRITE NEGATIVE 10/02/2017 2117   LEUKOCYTESUR NEGATIVE 10/02/2017 2117    Sepsis Labs: Lactic Acid, Venous    Component Value Date/Time   LATICACIDVEN 2.07 (HH)  01/27/2017 1336    MICROBIOLOGY: No results found for this or any previous visit (from the past 240 hour(s)).  RADIOLOGY STUDIES/RESULTS: Mr Lumbar Spine W Wo Contrast  Result Date: 09/29/2017 CLINICAL DATA:  32 y/o F; back pain with history of polysubstance/IV drug use, concern for infection. EXAM: MRI LUMBAR SPINE WITHOUT AND WITH CONTRAST TECHNIQUE: Multiplanar and multiecho pulse sequences of the lumbar spine were obtained without and with intravenous contrast. CONTRAST:  23mL MULTIHANCE GADOBENATE DIMEGLUMINE 529 MG/ML IV SOLN COMPARISON:  02/07/2017 CT abdomen and pelvis. FINDINGS: Segmentation:  Standard. Alignment:  Physiologic. Vertebrae: No fracture, evidence of discitis, or bone lesion. No abnormal enhancement. Small L1 inferior endplate chronic Schmorl's node. Conus medullaris and cauda equina: Conus extends to the L1 level. Conus and cauda equina appear normal. No abnormal enhancement. Paraspinal and other soft tissues: 4 mm cyst in the lower pole of right kidney. Disc levels: No significant disc displacement, foraminal stenosis, or canal stenosis. IMPRESSION: Negative lumbar spine MRI.  No evidence of discitis osteomyelitis. Electronically Signed   By: [name] M.D.   On: 09/29/2017 22:21   Mr Knee Right Wo Contrast  Result Date: 10/05/2017 CLINICAL DATA:  Right knee pain and swelling. History of IV drug abuse. EXAM: MRI OF THE RIGHT KNEE WITHOUT CONTRAST TECHNIQUE: Multiplanar, multisequence MR imaging of the knee was performed. No intravenous contrast was administered. COMPARISON:  None. FINDINGS: MENISCI Medial meniscus:  Intact Lateral meniscus:  Intact LIGAMENTS Cruciates:  Intact.  Mucoid degeneration of the ACL. Collaterals:  Intact CARTILAGE Patellofemoral:  Normal Medial:  Normal Lateral:  Normal Joint:  No joint effusion or synovitis. Popliteal Fossa:  No popliteal mass or Baker's cyst. Extensor Mechanism: The patella retinacular structures are intact and the  quadriceps and patellar tendons are intact. Bones:  No acute bony findings.  No evidence of osteomyelitis. Other: Mild subcutaneous soft tissue swelling/edema/fluid suggesting cellulitis. No drainable soft tissue abscess. IMPRESSION: 1. No MR findings to suggest septic arthritis, osteomyelitis or drainable soft tissue abscess. 2. Mild diffuse cellulitis. 3. No findings for internal derangement. Electronically Signed   By: Marijo Sanes M.D.   On: 10/05/2017 10:26     LOS: 50 days   Oren Binet, MD  [facility] Pager:[phone]  If 7PM-7AM, please contact night-coverage [email or web address] Password [email or web address] 10/24/2017, 1:56 PM

## 2017-10-25 NOTE — Progress Notes (Signed)
PROGRESS NOTE        PATIENT DETAILS Name: Sherri Francis Age: 32 y.o. Sex: female Date of Birth: [birth year] Admit Date: 10/02/2017 Admitting Physician Vianne Bulls, MD BTD:[id number], No Pcp Per  Brief Narrative: Sherri Francis is a 32 y.o. 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  She unfortunately left AMA on 4/16, and then got a self readmitted on 4/17.  A TEE on 4/22 showed resolution of her tricuspid regurgitation-she was resumed on cefazolin and started on Suboxone.  She unfortunately left AMA again on 4/27 and returned within 4 hours to resume treatment.  She currently remains on IV Ancef with plans for a 6-week therapy from 4/16.  Subjective: Sleeping comfortably-no major issues overnight.  Mother at bedside.  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  Repeat blood pressure on 4/23.  Not a candidate for outpatient IV antibiotic therapy given history of IVDA.  Plans are to remain inpatient until later this month she completes antimicrobial therapy  Heroin abuse with withdrawal symptoms: Withdrawal symptoms being treated with Suboxone taper-unfortunately looks like while the patient is in the hospital she may have used methamphetamine, urine drug screen on 5/18+ for methamphetamine.  See prior notes.  Spoke with mother at bedside-in the nursing leadership been the patient's floor.  Patient's mother wants me to send out a gold QuantiFERON TB test as the outpatient drug rehab facility needs this prior to excepting this patient-I have ordered.   Anxiety/depression: Stable-continue Effexor, propranolol and BuSpar as needed.    Right knee pain: Seems to have  resolved-MRI on 4/29- for effusion-no clinical signs of septic arthritis at this time.  Chronic hepatitis C: Stable for outpatient follow-up and treatment when she follows with infectious disease  Amenorrhea: Pregnancy test negative-she finally had a menstrual cycle on 5/14 last menstrual cycle was this past October.  Defer further work-up to the outpatient setting.   Insomnia: Continue prn trazodone-which patient claims has no effect on her-add prn low-dose Ambien to see if this will help her.  DVT Prophylaxis: Prophylactic Lovenox  Code Status: Full code   Family Communication: None at bedside  Disposition Plan: Remain inpatient-we will remain inpatient until she completes a course of antimicrobial therapy-later this month.  Antimicrobial agents: Anti-infectives (From admission, onward)   Start     Dose/Rate Route Frequency Ordered Stop   10/04/17 1200  Oritavancin Diphosphate (ORBACTIV) 1,200 mg in dextrose 5 % IVPB     1,200 mg 333.3 mL/hr over 180 Minutes Intravenous Once 10/04/17 1108 10/04/17 1600   10/03/17 0600  ceFAZolin (ANCEF) IVPB 2g/100 mL premix     2 g 200 mL/hr over 30 Minutes Intravenous Every 8 hours 10/02/17 2311     10/02/17 2315  ceFAZolin (ANCEF) IVPB 1 g/50 mL premix     1 g 100 mL/hr over 30 Minutes Intravenous  Once 10/02/17 2312 10/03/17 0024   10/02/17 2130  ceFAZolin (ANCEF) IVPB 1 g/50 mL premix     1 g 100 mL/hr over 30 Minutes Intravenous  Once 10/02/17 2118 10/02/17 2224      Procedures: 4/22>> TEE  CONSULTS:  ID  Time spent: 25-  minutes-Greater than 50% of this time was spent in counseling, explanation of diagnosis, planning of further management, and coordination of care.  MEDICATIONS: Scheduled Meds: . [START ON 10/26/2017] buprenorphine-naloxone  1 tablet Sublingual Daily  . enoxaparin (LOVENOX) injection  40 mg Subcutaneous Daily  . gabapentin  200 mg Oral QHS  . gabapentin  400 mg Oral TID WC  . lidocaine  1 patch Transdermal  Daily  . nicotine  21 mg Transdermal Daily  . polyethylene glycol  17 g Oral Daily  . propranolol  10 mg Oral BID  . venlafaxine XR  75 mg Oral Q breakfast   Continuous Infusions: .  ceFAZolin (ANCEF) IV 2 g (10/25/17 0510)   PRN Meds:.acetaminophen **OR** acetaminophen, bisacodyl, busPIRone, cyclobenzaprine, diclofenac sodium, hydrOXYzine, ibuprofen, ondansetron **OR** ondansetron (ZOFRAN) IV, senna-docusate, traZODone, zolpidem   PHYSICAL EXAM: Vital signs: Vitals:   10/24/17 0642 10/24/17 1100 10/24/17 2136 10/25/17 0518  BP: 116/71 104/64 120/77 115/67  Pulse: 85 88 97 92  Resp:   17 18  Temp: 98.3 F (36.8 C) 98.4 F (36.9 C) 98 F (36.7 C) 98.1 F (36.7 C)  TempSrc: Oral Oral Axillary Axillary  SpO2: 98% 99% 100% 100%  Weight:      Height:       Filed Weights   10/02/17 1737 10/03/17 1455  Weight: 47.6 kg (105 lb) 47.6 kg (105 lb)   Body mass index is 19.84 kg/m.   General appearance:Awake, alert, not in any distress.  Eyes:no scleral icterus. HEENT: Atraumatic and Normocephalic Neck: supple, no JVD. Resp:Good air entry bilaterally,no rales or rhonchi CVS: S1 S2 regular, no murmurs.  GI: Bowel sounds present, Non tender and not distended with no gaurding, rigidity or rebound. Extremities: B/L Lower Ext shows no edema, both legs are warm to touch Neurology:  Non focal Musculoskeletal:No digital cyanosis Skin:No Rash, warm and dry Wounds:N/A  I have personally reviewed following labs and imaging studies  LABORATORY DATA: CBC: Recent Labs  Lab 10/23/17 0734  WBC 6.0  HGB 11.1*  HCT 36.2  MCV 78.9  PLT 511*    Basic Metabolic Panel: Recent Labs  Lab 10/23/17 0734  NA 142  K 3.9  CL 107  CO2 27  GLUCOSE 93  BUN 17  CREATININE 0.55  CALCIUM 8.9    GFR: Estimated Creatinine Clearance: 77.3 mL/min (by C-G formula based on SCr of 0.55 mg/dL).  Liver Function Tests: No results for input(s): AST, ALT, ALKPHOS, BILITOT, PROT, ALBUMIN in the  last 168 hours. No results for input(s): LIPASE, AMYLASE in the last 168 hours. No results for input(s): AMMONIA in the last 168 hours.  Coagulation Profile: No results for input(s): INR, PROTIME in the last 168 hours.  Cardiac Enzymes: No results for input(s): CKTOTAL, CKMB, CKMBINDEX, TROPONINI in the last 168 hours.  BNP (last 3 results) No results for input(s): PROBNP in the last 8760 hours.  HbA1C: No results for input(s): HGBA1C in the last 72 hours.  CBG: No results for input(s): GLUCAP in the last 168 hours.  Lipid Profile: No results for input(s): CHOL, HDL, LDLCALC, TRIG, CHOLHDL, LDLDIRECT in the last 72 hours.  Thyroid Function Tests: No results for input(s): TSH, T4TOTAL, FREET4, T3FREE, THYROIDAB in the last 72 hours.  Anemia Panel: No results for input(s): VITAMINB12, FOLATE, FERRITIN, TIBC, IRON, RETICCTPCT in the last 72 hours.  Urine analysis:    Component Value Date/Time   COLORURINE YELLOW 10/02/2017 2117   APPEARANCEUR CLOUDY (A) 10/02/2017 2117   LABSPEC 1.025  10/02/2017 2117   PHURINE 5.0 10/02/2017 2117   GLUCOSEU NEGATIVE 10/02/2017 2117   HGBUR NEGATIVE 10/02/2017 2117   BILIRUBINUR NEGATIVE 10/02/2017 2117   KETONESUR NEGATIVE 10/02/2017 2117   PROTEINUR NEGATIVE 10/02/2017 2117   UROBILINOGEN 0.2 10/15/2012 1341   NITRITE NEGATIVE 10/02/2017 2117   LEUKOCYTESUR NEGATIVE 10/02/2017 2117    Sepsis Labs: Lactic Acid, Venous    Component Value Date/Time   LATICACIDVEN 2.07 (HH) 01/27/2017 1336    MICROBIOLOGY: No results found for this or any previous visit (from the past 240 hour(s)).  RADIOLOGY STUDIES/RESULTS: Mr Lumbar Spine W Wo Contrast  Result Date: 09/29/2017 CLINICAL DATA:  32 y/o F; back pain with history of polysubstance/IV drug use, concern for infection. EXAM: MRI LUMBAR SPINE WITHOUT AND WITH CONTRAST TECHNIQUE: Multiplanar and multiecho pulse sequences of the lumbar spine were obtained without and with intravenous  contrast. CONTRAST:  56mL MULTIHANCE GADOBENATE DIMEGLUMINE 529 MG/ML IV SOLN COMPARISON:  02/07/2017 CT abdomen and pelvis. FINDINGS: Segmentation:  Standard. Alignment:  Physiologic. Vertebrae: No fracture, evidence of discitis, or bone lesion. No abnormal enhancement. Small L1 inferior endplate chronic Schmorl's node. Conus medullaris and cauda equina: Conus extends to the L1 level. Conus and cauda equina appear normal. No abnormal enhancement. Paraspinal and other soft tissues: 4 mm cyst in the lower pole of right kidney. Disc levels: No significant disc displacement, foraminal stenosis, or canal stenosis. IMPRESSION: Negative lumbar spine MRI.  No evidence of discitis osteomyelitis. Electronically Signed   By: [name] M.D.   On: 09/29/2017 22:21   Mr Knee Right Wo Contrast  Result Date: 10/05/2017 CLINICAL DATA:  Right knee pain and swelling. History of IV drug abuse. EXAM: MRI OF THE RIGHT KNEE WITHOUT CONTRAST TECHNIQUE: Multiplanar, multisequence MR imaging of the knee was performed. No intravenous contrast was administered. COMPARISON:  None. FINDINGS: MENISCI Medial meniscus:  Intact Lateral meniscus:  Intact LIGAMENTS Cruciates:  Intact.  Mucoid degeneration of the ACL. Collaterals:  Intact CARTILAGE Patellofemoral:  Normal Medial:  Normal Lateral:  Normal Joint:  No joint effusion or synovitis. Popliteal Fossa:  No popliteal mass or Baker's cyst. Extensor Mechanism: The patella retinacular structures are intact and the quadriceps and patellar tendons are intact. Bones:  No acute bony findings.  No evidence of osteomyelitis. Other: Mild subcutaneous soft tissue swelling/edema/fluid suggesting cellulitis. No drainable soft tissue abscess. IMPRESSION: 1. No MR findings to suggest septic arthritis, osteomyelitis or drainable soft tissue abscess. 2. Mild diffuse cellulitis. 3. No findings for internal derangement. Electronically Signed   By: Marijo Sanes M.D.   On: 10/05/2017 10:26      LOS: 23 days   Oren Binet, MD  [facility] Pager:[phone]  If 7PM-7AM, please contact night-coverage [email or web address] Password [email or web address] 10/25/2017, 1:10 PM

## 2017-10-26 NOTE — Progress Notes (Signed)
PROGRESS NOTE        PATIENT DETAILS Name: Sherri Francis Age: 32 y.o. Sex: female Date of Birth: [birth year] Admit Date: 10/02/2017 Admitting Physician Vianne Bulls, MD [id number], No Pcp Per  Brief Narrative: Sherri Francis is a 32 y.o. 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  She unfortunately left AMA on 4/16, and then got a self readmitted on 4/17.  A TEE on 4/22 showed resolution of her tricuspid regurgitation-she was resumed on cefazolin and started on Suboxone.  She unfortunately left AMA again on 4/27 and returned within 4 hours to resume treatment.  She currently remains on IV Ancef with plans for a 6-week therapy from 4/16.  Subjective: Sleeping-does not want the lights to be turned on.  Family providing almost 24/7 supervision.  Tourist information centre manager in place.  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  Repeat blood pressure on 4/23.  Not a candidate for outpatient IV antibiotic therapy given history of IVDA.  Plans are to remain inpatient until later this month she completes antimicrobial therapy  Heroin abuse with withdrawal symptoms: Withdrawal symptoms being treated with Suboxone taper-unfortunately looks like while the patient is in the hospital she may have used methamphetamine, urine drug screen on 5/18+ for methamphetamine.  See prior notes.  After extensive discussion with nursing staff, and with the patient's mother-family has been providing 24/7 supervision.  We will continue with full supportive care has been doing.  Refused gold QuantiFERON-TB test yesterday-agreeable to have this today.  Apparently is required by outpatient rehab facility prior to  admission there.  As noted in my prior notes, family is contemplating taking her directly from the hospital to drug rehab facility and [place].  Anxiety/depression: Stable-continue Effexor, propranolol and BuSpar as needed.    Right knee pain: Seems to have resolved-MRI on 4/29- for effusion-no clinical signs of septic arthritis at this time.  Chronic hepatitis C: Stable for outpatient follow-up and treatment when she follows with infectious disease  Amenorrhea: Pregnancy test negative-she finally had a menstrual cycle on 5/14 last menstrual cycle was this past October.  Defer further work-up to the outpatient setting.   Insomnia: Continue prn trazodone and Ambien.  DVT Prophylaxis: Prophylactic Lovenox  Code Status: Full code   Family Communication: None at bedside  Disposition Plan: Remain inpatient-we will remain inpatient until she completes a course of antimicrobial therapy-later this month.  Antimicrobial agents: Anti-infectives (From admission, onward)   Start     Dose/Rate Route Frequency Ordered Stop   10/04/17 1200  Oritavancin Diphosphate (ORBACTIV) 1,200 mg in dextrose 5 % IVPB     1,200 mg 333.3 mL/hr over 180 Minutes Intravenous Once 10/04/17 1108 10/04/17 1600   10/03/17 0600  ceFAZolin (ANCEF) IVPB 2g/100 mL premix     2 g 200 mL/hr over 30 Minutes Intravenous Every 8 hours 10/02/17 2311     10/02/17 2315  ceFAZolin (ANCEF) IVPB 1 g/50 mL premix     1 g 100 mL/hr over 30 Minutes Intravenous  Once 10/02/17 2312 10/03/17 0024   10/02/17 2130  ceFAZolin (ANCEF) IVPB 1 g/50 mL premix     1 g 100 mL/hr  over 30 Minutes Intravenous  Once 10/02/17 2118 10/02/17 2224      Procedures: 4/22>> TEE  CONSULTS:  ID  Time spent: 25- minutes-Greater than 50% of this time was spent in counseling, explanation of diagnosis, planning of further management, and coordination of care.  MEDICATIONS: Scheduled Meds: . buprenorphine-naloxone  1 tablet Sublingual Daily  .  enoxaparin (LOVENOX) injection  40 mg Subcutaneous Daily  . gabapentin  200 mg Oral QHS  . gabapentin  400 mg Oral TID WC  . lidocaine  1 patch Transdermal Daily  . nicotine  21 mg Transdermal Daily  . polyethylene glycol  17 g Oral Daily  . propranolol  10 mg Oral BID  . venlafaxine XR  75 mg Oral Q breakfast   Continuous Infusions: .  ceFAZolin (ANCEF) IV 2 g (10/26/17 0531)   PRN Meds:.acetaminophen **OR** acetaminophen, bisacodyl, busPIRone, cyclobenzaprine, diclofenac sodium, hydrOXYzine, ibuprofen, ondansetron **OR** ondansetron (ZOFRAN) IV, senna-docusate, traZODone, zolpidem   PHYSICAL EXAM: Vital signs: Vitals:   10/25/17 1609 10/25/17 1617 10/25/17 2157 10/26/17 0531  BP: (!) 89/53 (!) 94/55 (!) 116/59 (!) 101/58  Pulse: 69 70 65 68  Resp: 14  16 16   Temp: 98.5 F (36.9 C)  97.8 F (36.6 C) 97.9 F (36.6 C)  TempSrc: Oral  Oral Oral  SpO2: 98%  96% 98%  Weight:      Height:       Filed Weights   10/02/17 1737 10/03/17 1455  Weight: 47.6 kg (105 lb) 47.6 kg (105 lb)   Body mass index is 19.84 kg/m.   General appearance:Awake, alert, not in any distress.  Eyes:no scleral icterus. HEENT: Atraumatic and Normocephalic Neck: supple, no JVD. Resp:Good air entry bilaterally,no rales or rhonchi CVS: S1 S2 regular, no murmurs.  GI: Bowel sounds present, Non tender and not distended with no gaurding, rigidity or rebound. Extremities: B/L Lower Ext shows no edema, both legs are warm to touch Neurology:  Non focal Musculoskeletal:No digital cyanosis Skin:No Rash, warm and dry Wounds:N/A  I have personally reviewed following labs and imaging studies  LABORATORY DATA: CBC: Recent Labs  Lab 10/23/17 0734  WBC 6.0  HGB 11.1*  HCT 36.2  MCV 78.9  PLT 511*    Basic Metabolic Panel: Recent Labs  Lab 10/23/17 0734  NA 142  K 3.9  CL 107  CO2 27  GLUCOSE 93  BUN 17  CREATININE 0.55  CALCIUM 8.9    GFR: Estimated Creatinine Clearance: 77.3 mL/min (by  C-G formula based on SCr of 0.55 mg/dL).  Liver Function Tests: No results for input(s): AST, ALT, ALKPHOS, BILITOT, PROT, ALBUMIN in the last 168 hours. No results for input(s): LIPASE, AMYLASE in the last 168 hours. No results for input(s): AMMONIA in the last 168 hours.  Coagulation Profile: No results for input(s): INR, PROTIME in the last 168 hours.  Cardiac Enzymes: No results for input(s): CKTOTAL, CKMB, CKMBINDEX, TROPONINI in the last 168 hours.  BNP (last 3 results) No results for input(s): PROBNP in the last 8760 hours.  HbA1C: No results for input(s): HGBA1C in the last 72 hours.  CBG: No results for input(s): GLUCAP in the last 168 hours.  Lipid Profile: No results for input(s): CHOL, HDL, LDLCALC, TRIG, CHOLHDL, LDLDIRECT in the last 72 hours.  Thyroid Function Tests: No results for input(s): TSH, T4TOTAL, FREET4, T3FREE, THYROIDAB in the last 72 hours.  Anemia Panel: No results for input(s): VITAMINB12, FOLATE, FERRITIN, TIBC, IRON, RETICCTPCT in the last 72 hours.  Urine  analysis:    Component Value Date/Time   COLORURINE YELLOW 10/02/2017 2117   APPEARANCEUR CLOUDY (A) 10/02/2017 2117   LABSPEC 1.025 10/02/2017 2117   PHURINE 5.0 10/02/2017 2117   GLUCOSEU NEGATIVE 10/02/2017 2117   HGBUR NEGATIVE 10/02/2017 2117   BILIRUBINUR NEGATIVE 10/02/2017 2117   KETONESUR NEGATIVE 10/02/2017 2117   PROTEINUR NEGATIVE 10/02/2017 2117   UROBILINOGEN 0.2 10/15/2012 1341   NITRITE NEGATIVE 10/02/2017 2117   LEUKOCYTESUR NEGATIVE 10/02/2017 2117    Sepsis Labs: Lactic Acid, Venous    Component Value Date/Time   LATICACIDVEN 2.07 (HH) 01/27/2017 1336    MICROBIOLOGY: No results found for this or any previous visit (from the past 240 hour(s)).  RADIOLOGY STUDIES/RESULTS: Mr Lumbar Spine W Wo Contrast  Result Date: 09/29/2017 CLINICAL DATA:  32 y/o F; back pain with history of polysubstance/IV drug use, concern for infection. EXAM: MRI LUMBAR SPINE WITHOUT  AND WITH CONTRAST TECHNIQUE: Multiplanar and multiecho pulse sequences of the lumbar spine were obtained without and with intravenous contrast. CONTRAST:  67mL MULTIHANCE GADOBENATE DIMEGLUMINE 529 MG/ML IV SOLN COMPARISON:  02/07/2017 CT abdomen and pelvis. FINDINGS: Segmentation:  Standard. Alignment:  Physiologic. Vertebrae: No fracture, evidence of discitis, or bone lesion. No abnormal enhancement. Small L1 inferior endplate chronic Schmorl's node. Conus medullaris and cauda equina: Conus extends to the L1 level. Conus and cauda equina appear normal. No abnormal enhancement. Paraspinal and other soft tissues: 4 mm cyst in the lower pole of right kidney. Disc levels: No significant disc displacement, foraminal stenosis, or canal stenosis. IMPRESSION: Negative lumbar spine MRI.  No evidence of discitis osteomyelitis. Electronically Signed   By: [name] M.D.   On: 09/29/2017 22:21   Mr Knee Right Wo Contrast  Result Date: 10/05/2017 CLINICAL DATA:  Right knee pain and swelling. History of IV drug abuse. EXAM: MRI OF THE RIGHT KNEE WITHOUT CONTRAST TECHNIQUE: Multiplanar, multisequence MR imaging of the knee was performed. No intravenous contrast was administered. COMPARISON:  None. FINDINGS: MENISCI Medial meniscus:  Intact Lateral meniscus:  Intact LIGAMENTS Cruciates:  Intact.  Mucoid degeneration of the ACL. Collaterals:  Intact CARTILAGE Patellofemoral:  Normal Medial:  Normal Lateral:  Normal Joint:  No joint effusion or synovitis. Popliteal Fossa:  No popliteal mass or Baker's cyst. Extensor Mechanism: The patella retinacular structures are intact and the quadriceps and patellar tendons are intact. Bones:  No acute bony findings.  No evidence of osteomyelitis. Other: Mild subcutaneous soft tissue swelling/edema/fluid suggesting cellulitis. No drainable soft tissue abscess. IMPRESSION: 1. No MR findings to suggest septic arthritis, osteomyelitis or drainable soft tissue abscess. 2. Mild  diffuse cellulitis. 3. No findings for internal derangement. Electronically Signed   By: Marijo Sanes M.D.   On: 10/05/2017 10:26     LOS: 24 days   Oren Binet, MD  [facility] Pager:[phone]  If 7PM-7AM, please contact night-coverage [email or web address] Password [email or web address] 10/26/2017, 1:51 PM

## 2017-10-27 NOTE — Progress Notes (Signed)
PROGRESS NOTE    Sherri Francis  VFI:[id number] DOB: 10-09-1985 DOA: 10/02/2017 PCP: Patient, No Pcp Per Outpatient Specialists:     Brief Narrative:  Sherri Francis is a 32 y.o. 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  She unfortunately left AMA on 4/16, and then got a self readmitted on 4/17.  A TEE on 4/22 showed resolution of her tricuspid regurgitation-she was resumed on cefazolin and started on Suboxone.  She unfortunately left AMA again on 4/27 and returned within 4 hours to resume treatment.  She currently remains on IV Ancef with plans for a 6-week therapy from 4/16.   Assessment & Plan:   Principal Problem:   Acute osteomyelitis of cervical spine (HCC) Active Problems:   Bacteremia due to Staphylococcus aureus   Anemia   IV drug user   Hypokalemia   Bacteremia  MSSA bacteremia with tricuspid valve vegetation and cervical spine discitis: Continue cefazolin-6 weeks from 4/16.  Repeat blood culture on 4/23 with no growth.  Not a candidate for outpatient IV antibiotic therapy given history of IVDA.  Plans are to remain inpatient until later this month she completes antimicrobial therapy  Heroin abuse with withdrawal symptoms: Withdrawal symptoms being treated with Suboxone taper-unfortunately looks like while the patient is in the hospital she may have used methamphetamine, urine drug screen on 5/18+ for methamphetamine.  See prior notes.  After extensive discussion with nursing staff, and with the patient's mother-family has been providing 24/7 supervision.  We will continue with full supportive care has been doing.  Refused gold QuantiFERON-TB test yesterday-agreeable to have this today.  Apparently is required by outpatient rehab facility prior to admission there.  As  noted in my prior notes, family is contemplating taking her directly from the hospital to drug rehab facility and [place].  Anxiety/depression: Stable-continue Effexor, propranolol and BuSpar as needed.    Right knee pain: Seems to have resolved-MRI on 4/29- for effusion-no clinical signs of septic arthritis at this time.  Chronic hepatitis C: Stable for outpatient follow-up and treatment when she follows with infectious disease  Amenorrhea: Pregnancy test negative-she finally had a menstrual cycle on 5/14 last menstrual cycle was this past October.  Defer further work-up to the outpatient setting.   Insomnia: Continue prn trazodone and Ambien.   DVT prophylaxis: Prophylactic Lovenonx Code Status: Full Family Communication: Mother at bedside during assessment, updated  Disposition Plan: Remain inpatient, she will remain inpatient until she completes a course of antimicrobial therapy   Consultants:   ID  Procedures:   4/22 - TEE  Antimicrobials:   Oritavancin 1,200mg  once 10/04/17  Cefazolin 2g every 8hrs started on 10/02/17 end on 6 week out   Cefazolin 1g once started on 10/02/17 ended on 10/02/17     Subjective: Sleeping again this morning-does not want the lights to be turned on.  Family providing almost 24/7 supervision, mom present at bedside today.  Tourist information centre manager in place.   Objective: Vitals:   10/26/17 0531 10/26/17 1431 10/26/17 2134 10/27/17 0505  BP: (!) 101/58 121/64 125/60 118/74  Pulse: 68 75 82 74  Resp: 16  17 18   Temp: 97.9 F (36.6 C) 98 F (36.7 C) 98.5 F (36.9 C) 98.3 F (36.8 C)  TempSrc: Oral Oral Oral Oral  SpO2: 98% 100% 100% 100%  Weight:  Height:        Intake/Output Summary (Last 24 hours) at 10/27/2017 1139 Last data filed at 10/27/2017 0600 Gross per 24 hour  Intake 960 ml  Output -  Net 960 ml   Filed Weights   10/02/17 1737 10/03/17 1455  Weight: 47.6 kg (105 lb) 47.6 kg (105 lb)    Examination:  General  exam: Appears calm and comfortable  Respiratory system: Clear to auscultation. Respiratory effort normal. Cardiovascular system: S1 & S2 heard, RRR. No JVD, murmurs, rubs, gallops or clicks. No pedal edema. Gastrointestinal system: Abdomen is nondistended, soft and nontender. No organomegaly or masses felt. Normal bowel sounds heard. Central nervous system: Alert and oriented. No focal neurological deficits. Extremities: Symmetric 5 x 5 power. Skin: No rashes, lesions or ulcers Psychiatry: Judgement and insight appear normal. Mood & affect appropriate.    Data Reviewed: I have personally reviewed following labs and imaging studies  CBC: Recent Labs  Lab 10/23/17 0734  WBC 6.0  HGB 11.1*  HCT 36.2  MCV 78.9  PLT 417*   Basic Metabolic Panel: Recent Labs  Lab 10/23/17 0734  NA 142  K 3.9  CL 107  CO2 27  GLUCOSE 93  BUN 17  CREATININE 0.55  CALCIUM 8.9   GFR: Estimated Creatinine Clearance: 77.3 mL/min (by C-G formula based on SCr of 0.55 mg/dL). Liver Function Tests: No results for input(s): AST, ALT, ALKPHOS, BILITOT, PROT, ALBUMIN in the last 168 hours. No results for input(s): LIPASE, AMYLASE in the last 168 hours. No results for input(s): AMMONIA in the last 168 hours. Coagulation Profile: No results for input(s): INR, PROTIME in the last 168 hours. Cardiac Enzymes: No results for input(s): CKTOTAL, CKMB, CKMBINDEX, TROPONINI in the last 168 hours. BNP (last 3 results) No results for input(s): PROBNP in the last 8760 hours. HbA1C: No results for input(s): HGBA1C in the last 72 hours. CBG: No results for input(s): GLUCAP in the last 168 hours. Lipid Profile: No results for input(s): CHOL, HDL, LDLCALC, TRIG, CHOLHDL, LDLDIRECT in the last 72 hours. Thyroid Function Tests: No results for input(s): TSH, T4TOTAL, FREET4, T3FREE, THYROIDAB in the last 72 hours. Anemia Panel: No results for input(s): VITAMINB12, FOLATE, FERRITIN, TIBC, IRON, RETICCTPCT in the last  72 hours. Urine analysis:    Component Value Date/Time   COLORURINE YELLOW 10/02/2017 2117   APPEARANCEUR CLOUDY (A) 10/02/2017 2117   LABSPEC 1.025 10/02/2017 2117   PHURINE 5.0 10/02/2017 2117   GLUCOSEU NEGATIVE 10/02/2017 2117   HGBUR NEGATIVE 10/02/2017 2117   BILIRUBINUR NEGATIVE 10/02/2017 2117   KETONESUR NEGATIVE 10/02/2017 2117   PROTEINUR NEGATIVE 10/02/2017 2117   UROBILINOGEN 0.2 10/15/2012 1341   NITRITE NEGATIVE 10/02/2017 2117   LEUKOCYTESUR NEGATIVE 10/02/2017 2117   Sepsis Labs: @LABRCNTIP (procalcitonin:4,lacticidven:4)  )No results found for this or any previous visit (from the past 240 hour(s)).    Radiology Studies: No results found.   Scheduled Meds: . buprenorphine-naloxone  1 tablet Sublingual Daily  . enoxaparin (LOVENOX) injection  40 mg Subcutaneous Daily  . gabapentin  200 mg Oral QHS  . gabapentin  400 mg Oral TID WC  . lidocaine  1 patch Transdermal Daily  . nicotine  21 mg Transdermal Daily  . polyethylene glycol  17 g Oral Daily  . propranolol  10 mg Oral BID  . venlafaxine XR  75 mg Oral Q breakfast   Continuous Infusions: .  ceFAZolin (ANCEF) IV Stopped (10/27/17 0606)     LOS: 25 days  Time spent:  25- minutes-Greater than 50% of this time was spent in counseling, explanation of diagnosis, planning of further management, and coordination of care.   Johnsie Cancel, NP [facility]   If 7PM-7AM, please contact night-coverage [email or web address] Password [facility] 10/27/2017, 11:39 AM

## 2017-10-28 NOTE — Discharge Summary (Signed)
PATIENT DETAILS Name: Sherri Francis Age: 32 y.o. Sex: female Date of Birth: 08/01/1985 MRN: [id number]. Admitting Physician: Vianne Bulls, MD PJK:DTOIZTI, No Pcp Per  Admit Date: 10/02/2017 Discharge date: 10/28/2017  Note:patient left AMA  Recommendations for Outpatient Follow-up:  1. Gold QuantiFERON pending-please follow  PRIMARY DISCHARGE DIAGNOSIS:  Principal Problem:   Acute osteomyelitis of cervical spine (HCC) Active Problems:   Bacteremia due to Staphylococcus aureus   Anemia   IV drug user   Hypokalemia   Bacteremia      PAST MEDICAL HISTORY: Past Medical History:  Diagnosis Date  . Anemia   . Anxiety   . Depression   . Endocarditis   . Hepatitis C   . IVDU (intravenous drug user)   . Osteomyelitis of cervical spine ([place]) 09/2017  . PID (acute pelvic inflammatory disease)   . Suicide attempt [facility])    multiple attempts  . Teratoma   . UTI (urinary tract infection)     ALLERGIES:  No Known Allergies  BRIEF HPI:  See H&P, Labs, Consult and Test reports for all details in brief, Sherri Francis is a 32 y.o. 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 She unfortunately left AMA on 4/16, and then got a self readmitted on 4/17. A TEE on 4/22 showed resolution of her tricuspid regurgitation-she was resumed on cefazolin and started on Suboxone. She unfortunately left AMA again on 4/27 and returned within 4 hours to resume treatment. She currently remains on IV Ancef with plans for a 6-week therapy from 4/16.  She was supposed to continue with IV Ancef with a stop date of around 5/29, however on 5/23 the early morning-patient apparently got irate with her mother and signed out [place].   Hospital course by problem list: MSSA bacteremia  with tricuspid valve vegetation and cervical spine discitis:Continue cefazolin-6 weeks from 4/16. Repeat blood culture on 4/23 with no growth. Not a candidate for outpatient IV antibiotic therapy given history of IVDA. Plans were to remain inpatient until later this month when she completed a course of IV Ancef.  Unfortunately signed out [place].    Heroin abuse with withdrawal symptoms:Withdrawal symptoms were being treated with Suboxone taper-unfortunately looks like while the patient is in the hospital she may have used methamphetamine, urine drug screen on 5/18+ for methamphetamine. After extensive discussion with nursing staff, and with the patient's mother-family has been providing 24/7 supervision.  Family was planning to take patient directly to a drug rehab facility in [place]-directly from the hospital when she was discharged.  Unfortunately she signed out AGAINST MEDICAL ADVICE   Anxiety/depression: was managed Effexor, propranolol and BuSpar as needed.   Right knee pain:Seems to have resolved-MRI on 4/29- for effusion-no clinical signs of septic arthritis at this time.  Chronic hepatitis C: Stable for outpatient follow-up and treatment when she follows with infectious disease  Amenorrhea: Pregnancy test negative-she finally had a menstrual cycle on 5/14 last menstrual cycle was this past October. Defer further work-up to the outpatient setting.   Insomnia: prn trazodoneand Ambien.  CONSULTATIONS:   ID  PERTINENT RADIOLOGIC STUDIES: Mr Lumbar Spine W Wo Contrast  Result Date: 09/29/2017 CLINICAL DATA:  32 y/o F; back pain with history of polysubstance/IV drug use, concern for infection. EXAM: MRI LUMBAR SPINE WITHOUT AND WITH CONTRAST TECHNIQUE: Multiplanar  and multiecho pulse sequences of the lumbar spine were obtained without and with intravenous contrast. CONTRAST:  30mL MULTIHANCE GADOBENATE DIMEGLUMINE 529 MG/ML IV SOLN COMPARISON:  02/07/2017 CT  abdomen and pelvis. FINDINGS: Segmentation:  Standard. Alignment:  Physiologic. Vertebrae: No fracture, evidence of discitis, or bone lesion. No abnormal enhancement. Small L1 inferior endplate chronic Schmorl's node. Conus medullaris and cauda equina: Conus extends to the L1 level. Conus and cauda equina appear normal. No abnormal enhancement. Paraspinal and other soft tissues: 4 mm cyst in the lower pole of right kidney. Disc levels: No significant disc displacement, foraminal stenosis, or canal stenosis. IMPRESSION: Negative lumbar spine MRI.  No evidence of discitis osteomyelitis. Electronically Signed   By: [name] M.D.   On: 09/29/2017 22:21   Mr Knee Right Wo Contrast  Result Date: 10/05/2017 CLINICAL DATA:  Right knee pain and swelling. History of IV drug abuse. EXAM: MRI OF THE RIGHT KNEE WITHOUT CONTRAST TECHNIQUE: Multiplanar, multisequence MR imaging of the knee was performed. No intravenous contrast was administered. COMPARISON:  None. FINDINGS: MENISCI Medial meniscus:  Intact Lateral meniscus:  Intact LIGAMENTS Cruciates:  Intact.  Mucoid degeneration of the ACL. Collaterals:  Intact CARTILAGE Patellofemoral:  Normal Medial:  Normal Lateral:  Normal Joint:  No joint effusion or synovitis. Popliteal Fossa:  No popliteal mass or Baker's cyst. Extensor Mechanism: The patella retinacular structures are intact and the quadriceps and patellar tendons are intact. Bones:  No acute bony findings.  No evidence of osteomyelitis. Other: Mild subcutaneous soft tissue swelling/edema/fluid suggesting cellulitis. No drainable soft tissue abscess. IMPRESSION: 1. No MR findings to suggest septic arthritis, osteomyelitis or drainable soft tissue abscess. 2. Mild diffuse cellulitis. 3. No findings for internal derangement. Electronically Signed   By: Marijo Sanes M.D.   On: 10/05/2017 10:26     PERTINENT LAB RESULTS: CBC: No results for input(s): WBC, HGB, HCT, PLT in the last 72  hours. CMET CMP     Component Value Date/Time   NA 142 10/23/2017 0734   K 3.9 10/23/2017 0734   CL 107 10/23/2017 0734   CO2 27 10/23/2017 0734   GLUCOSE 93 10/23/2017 0734   BUN 17 10/23/2017 0734   CREATININE 0.55 10/23/2017 0734   CALCIUM 8.9 10/23/2017 0734   PROT 6.5 10/02/2017 2117   ALBUMIN 2.9 (L) 10/02/2017 2117   AST 23 10/02/2017 2117   ALT 17 10/02/2017 2117   ALKPHOS 86 10/02/2017 2117   BILITOT 0.5 10/02/2017 2117   GFRNONAA >60 10/23/2017 0734   GFRAA >60 10/23/2017 0734    GFR Estimated Creatinine Clearance: 77.3 mL/min (by C-G formula based on SCr of 0.55 mg/dL). No results for input(s): LIPASE, AMYLASE in the last 72 hours. No results for input(s): CKTOTAL, CKMB, CKMBINDEX, TROPONINI in the last 72 hours. Invalid input(s): POCBNP No results for input(s): DDIMER in the last 72 hours. No results for input(s): HGBA1C in the last 72 hours. No results for input(s): CHOL, HDL, LDLCALC, TRIG, CHOLHDL, LDLDIRECT in the last 72 hours. No results for input(s): TSH, T4TOTAL, T3FREE, THYROIDAB in the last 72 hours.  Invalid input(s): FREET3 No results for input(s): VITAMINB12, FOLATE, FERRITIN, TIBC, IRON, RETICCTPCT in the last 72 hours. Coags: No results for input(s): INR in the last 72 hours.  Invalid input(s): PT Microbiology: No results found for this or any previous visit (from the past 240 hour(s)).   TODAY-DAY OF DISCHARGE:  Subjective:   Keita Demarco  signed out against medical advice.   Objective:  Blood pressure (!) 117/57, pulse 90, temperature 98.8 F (37.1 C), temperature source Oral, resp. rate 16, height 5\' 1"  (1.549 m), weight 47.6 kg (105 lb), last menstrual period 04/03/2017, SpO2 98 %.   DISCHARGE CONDITION: Not stable for discharge-left AMA  DISPOSITION: AMA  Follow-up Information    Campbell Riches, MD Follow up in 2 week(s).   Specialty:  Infectious Diseases Contact information: [place] [address] [phone]        Axel Filler, MD Follow up.   Specialty:  Internal Medicine Contact information: [place] [address] [phone]          Follow with your PCP in 1 week   Total Time spent on discharge equals 25  minutes.  SignedOren Binet 10/28/2017 12:37 PM

## 2017-10-28 NOTE — Progress Notes (Signed)
0017 Pt agitated with mother, insists on leaving AMA. Signed paper and left unit, stated she had a ride downstairs waiting. Dr Tylene Fantasia made aware.

## 2017-10-30 LAB — QUANTIFERON-TB GOLD PLUS (RQFGPL)
QUANTIFERON NIL VALUE: 0.02 [IU]/mL
QuantiFERON Mitogen Value: 8.4 IU/mL
QuantiFERON TB1 Ag Value: 0.42 IU/mL
QuantiFERON TB2 Ag Value: 0.28 IU/mL

## 2017-10-30 LAB — QUANTIFERON-TB GOLD PLUS: QuantiFERON-TB Gold Plus: POSITIVE — AB

## 2017-11-03 ENCOUNTER — Inpatient Hospital Stay: Payer: Self-pay | Admitting: Infectious Diseases

## 2018-01-10 ENCOUNTER — Encounter (HOSPITAL_COMMUNITY): Payer: Self-pay | Admitting: Family Medicine

## 2018-01-10 ENCOUNTER — Emergency Department (HOSPITAL_COMMUNITY): Payer: Self-pay

## 2018-01-10 ENCOUNTER — Emergency Department (HOSPITAL_COMMUNITY)
Admission: EM | Admit: 2018-01-10 | Discharge: 2018-01-10 | Disposition: A | Discharge status: Home / Self Care | Payer: Self-pay | Attending: Emergency Medicine | Admitting: Emergency Medicine

## 2018-01-10 DIAGNOSIS — L03011 Cellulitis of right finger: Secondary | ICD-10-CM | POA: Insufficient documentation

## 2018-01-10 DIAGNOSIS — L03019 Cellulitis of unspecified finger: Secondary | ICD-10-CM

## 2018-01-10 DIAGNOSIS — R69 Illness, unspecified: Secondary | ICD-10-CM

## 2018-01-10 DIAGNOSIS — M549 Dorsalgia, unspecified: Secondary | ICD-10-CM | POA: Insufficient documentation

## 2018-01-10 DIAGNOSIS — R079 Chest pain, unspecified: Secondary | ICD-10-CM | POA: Insufficient documentation

## 2018-01-10 DIAGNOSIS — F17210 Nicotine dependence, cigarettes, uncomplicated: Secondary | ICD-10-CM | POA: Insufficient documentation

## 2018-01-10 DIAGNOSIS — G8929 Other chronic pain: Secondary | ICD-10-CM | POA: Insufficient documentation

## 2018-01-10 DIAGNOSIS — M542 Cervicalgia: Secondary | ICD-10-CM | POA: Insufficient documentation

## 2018-01-10 DIAGNOSIS — F1910 Other psychoactive substance abuse, uncomplicated: Secondary | ICD-10-CM | POA: Insufficient documentation

## 2018-01-10 MED ORDER — DOXYCYCLINE HYCLATE 100 MG PO CAPS: 100.0000 mg | ORAL_CAPSULE | Freq: Two times a day (BID) | ORAL | 0 refills | Status: DC

## 2018-01-10 MED ORDER — ONDANSETRON 4 MG PO TBDP: 4.0000 mg | ORAL_TABLET | ORAL | 0 refills | Status: DC | PRN

## 2018-01-10 MED ORDER — FAMOTIDINE 20 MG PO TABS: 20.0000 mg | ORAL_TABLET | Freq: Two times a day (BID) | ORAL | 0 refills | Status: DC | PRN

## 2018-01-10 MED ORDER — IBUPROFEN 600 MG PO TABS: 600.0000 mg | ORAL_TABLET | Freq: Four times a day (QID) | ORAL | 0 refills | Status: DC | PRN

## 2018-01-10 MED ORDER — CEPHALEXIN 500 MG PO CAPS: 1000.0000 mg | ORAL_CAPSULE | Freq: Two times a day (BID) | ORAL | 0 refills | Status: DC

## 2018-01-10 NOTE — ED Provider Notes (Signed)
[place] DEPT Provider Note   CSN: [id number] Arrival date & time: 01/10/18  1314     History   Chief Complaint Chief Complaint  Patient presents with  . Neck Pain  . Fatigue    HPI Sherri Francis is a 32 y.o. female.  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  Police arrived at her house unexpectedly today to serve her warrant for arrest.  After police arrival, patient reports that she has been very sick.  She is thus brought to the emergency department.  Patient reports that she left the hospital [place] and is supposed to be getting along the course of antibiotics.  She reports that she has now been starting to feel sick.  She reports that she is sleeping a lot and knows that the infection is coming back.  She reports she is always hurting in her neck and her back.  Reports that she is got a red swollen thumb and she knows that that is where the infection starts when she is getting sick again.  She does not have any documented fevers.  No cough.  Shortness of breath.  Past Medical History:  Diagnosis Date  . Anemia   . Anxiety   . Depression   . Endocarditis   . Hepatitis C   . IVDU (intravenous drug user)   . Osteomyelitis of cervical spine ([place]) 09/2017  . PID (acute pelvic inflammatory disease)   . Suicide attempt [facility])    multiple attempts  . Teratoma   . UTI (urinary tract infection)     Patient Active Problem List   Diagnosis Date Noted  . Hypokalemia 10/02/2017  . Bacteremia 10/02/2017  . Anxiety and depression   . Osteomyelitis of cervical spine ([place]) 09/22/2017  . IV drug user   . Acute osteomyelitis of cervical spine ([place]) 09/20/2017  . Opioid abuse with opioid-induced mood disorder ([place]) 02/21/2017  . Fever 02/20/2017  . Anemia 02/20/2017  . Acute bacterial endocarditis   . Substance induced mood disorder ([place])  01/30/2017  . Opioid type dependence, continuous ([place]) 01/30/2017  . 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 Bacteremia due to Staphylococcus aureus 01/27/2017  . Pneumonia 01/24/2017  . Pelvic abscess in female 01/13/2016  . PID (acute pelvic inflammatory disease) 01/13/2016    Past Surgical History:  Procedure Laterality Date  . LAPAROSCOPY    . TEE WITHOUT CARDIOVERSION N/A 09/27/2017   Procedure: TRANSESOPHAGEAL ECHOCARDIOGRAM (TEE);  Surgeon: Pixie Casino, MD;  Location: [facility] ENDOSCOPY;  Service: Cardiovascular;  Laterality: N/A;     OB History    Gravida  0   Para  0   Term  0   Preterm  0   AB  0   Living  0     SAB  0   TAB  0   Ectopic  0   Multiple  0   Live Births  0            Home Medications    Prior to Admission medications   Medication Sig Start Date End Date Taking? Authorizing Provider  cephALEXin (KEFLEX) 500 MG capsule Take 2 capsules (1,000 mg total) by mouth 2 (two) times daily. 01/10/18   Charlesetta Shanks, MD  doxycycline (VIBRAMYCIN) 100 MG capsule Take 1 capsule (100 mg total) by mouth 2 (two) times daily. One po  bid x 7 days 01/10/18   Charlesetta Shanks, MD  famotidine (PEPCID) 20 MG tablet Take 1 tablet (20 mg total) by mouth 2 (two) times daily as needed for heartburn or indigestion. 01/10/18   Charlesetta Shanks, MD  ibuprofen (ADVIL,MOTRIN) 600 MG tablet Take 1 tablet (600 mg total) by mouth every 6 (six) hours as needed. 01/10/18   Charlesetta Shanks, MD  ondansetron (ZOFRAN ODT) 4 MG disintegrating tablet Take 1 tablet (4 mg total) by mouth every 4 (four) hours as needed for nausea or vomiting. 01/10/18   Charlesetta Shanks, MD    Family History Family History  Problem Relation Age of Onset  . Heart disease Maternal Grandfather   . Anxiety disorder Maternal Grandfather   . Heart disease Maternal Aunt   . Alcohol abuse Father   . Drug abuse Father   . Cancer Neg Hx      Social History Social History   Tobacco Use  . Smoking status: Current Every Day Smoker    Packs/day: 0.50    Types: Cigarettes  . Smokeless tobacco: Never Used  Substance Use Topics  . Alcohol use: Yes    Comment: Recovering, 3 years ago   . Drug use: Yes    Types: IV, Cocaine, Methamphetamines    Comment: Heronin: 2 days ago, Methamphetamines: 2 days ago, Deneis cocaine and marijuaina use      Allergies   Patient has no known allergies.   Review of Systems Review of Systems 10 Systems reviewed and are negative for acute change except as noted in the HPI.  Physical Exam Updated Vital Signs BP 131/87 (BP Location: Left Arm)   Pulse (!) 107   Temp 98.4 F (36.9 C) (Oral)   Resp 12   Ht 5\' 1"  (1.549 m)   Wt 52.2 kg (115 lb)   SpO2 99%   BMI 21.73 kg/m   Physical Exam  Constitutional: She is oriented to person, place, and time.  Patient is alert and nontoxic.  She is clinically well in appearance.  No respiratory distress.  She is tearful.  She is sitting up in the triage room.  HENT:  Head: Normocephalic and atraumatic.  Mouth/Throat: Oropharynx is clear and moist.  Eyes: EOM are normal.  Neck: Neck supple.  No meningismus.  Normal range of motion of the neck.  Patient spontaneously looking around putting her head up and down without limitation.  Cardiovascular:  Mild tachycardia.  No rub murmur gallop.  Pulmonary/Chest: Effort normal and breath sounds normal.  Abdominal: Soft. She exhibits no distension. There is no tenderness. There is no guarding.  Musculoskeletal: Normal range of motion.  Patient has an isolated diffuse erythema of the right thumb.  This appears to have a chronic ulcer on the thumb pad.  Possibly this is from some repetitive activity.  There is diffuse erythema of the thumb but it is fairly mild without any fluctuance.  See attached images.  There are no other areas of abscess.  Patient's forearms and and AC fossa are examined.  No inflamed  track marks.  Also examined lower legs without any peripheral edema or erythema.  Neurological: She is alert and oriented to person, place, and time. No cranial nerve deficit. She exhibits normal muscle tone. Coordination normal.  Skin: Skin is warm and dry. No rash noted.  Psychiatric:  Patient is tearful and anxious.           ED Treatments / Results  Labs (all labs ordered are listed, but only  abnormal results are displayed) Labs Reviewed - No data to display  EKG None  Radiology Dg Chest 2 View  Result Date: 01/10/2018 CLINICAL DATA:  Chest pain and endocarditis EXAM: CHEST - 2 VIEW COMPARISON:  09/20/2017 FINDINGS: The heart size and mediastinal contours are within normal limits. Both lungs are clear. The visualized skeletal structures are unremarkable. IMPRESSION: Normal chest. Electronically Signed   By: Ulyses Jarred M.D.   On: 01/10/2018 15:20    Procedures Procedures (including critical care time)  Medications Ordered in ED Medications - No data to display   Initial Impression / Assessment and Plan / ED Course  I have reviewed the triage vital signs and the nursing notes.  Pertinent labs & imaging results that were available during my care of the patient were reviewed by me and considered in my medical decision making (see chart for details).      Final Clinical Impressions(s) / ED Diagnoses   Final diagnoses:  Other chronic pain  Drug abuse ([place])  Felon of finger  Severe comorbid illness   Patient has history of serious complications of IV drug abuse.  She left the hospital AMA 5\27.  Today, patient was arrested at her home on outstanding warrants.  Once police had her in custody, she described medical symptoms precipitating a visit to the emergency department.  At this time, there do not appear to be acute findings that would suggest that the patient has exacerbation of her known underlying endocarditis/valvular vegetations and discitis.  She is nontoxic  and has normal vital signs except for mild tachycardia which I suspect is due to anxiety frombeing under arrest at this time.  She is very tearful.  Patient points out erythema of her right thumb, stating this is how her bacterial infection start.  This is relatively mild in appearance.  It appears that she has some chronic ulcerated wound on the finger pad.  This may be from some repetitive activity.  There is no fluctuance.  Nothing that would suggest need for incision and drainage.  Nothing that suggest tenosynovitis.  Due to the patient's known history of IV drug abuse will choose for prolonged double coverage with Keflex and Bactrim to be administered at the jail.  I anticipate the patient may experience symptoms of  withdrawal from narcotic dependency and have empirically provided prescriptions for Pepcid, Zofran and ibuprofen to be used on a as needed basis.  Solely because the patient's complex medical history, recommendation is for close medical monitoring but at this time there is no indication that patient is having complications of discitis or endocarditis that she is known to have and for which she has known risk factors.  My impression is that the patient is here for evaluation due to desire to avoid being taken to jail.  Staff at jail will be able to administer medications and observe the patient for any signs of medical illness and return her for reassessment if needed. ED Discharge Orders        Ordered    cephALEXin (KEFLEX) 500 MG capsule  2 times daily     01/10/18 1533    doxycycline (VIBRAMYCIN) 100 MG capsule  2 times daily     01/10/18 1533    ibuprofen (ADVIL,MOTRIN) 600 MG tablet  Every 6 hours PRN     01/10/18 1535    famotidine (PEPCID) 20 MG tablet  2 times daily PRN     01/10/18 1535    ondansetron (ZOFRAN ODT) 4 MG disintegrating  tablet  Every 4 hours PRN     01/10/18 1535       Charlesetta Shanks, MD 01/10/18 1654

## 2018-01-10 NOTE — ED Notes (Signed)
Bed: [facility]PT4 Expected date:  Expected time:  Means of arrival:  Comments: 

## 2018-01-10 NOTE — ED Triage Notes (Signed)
Patient reports she is has not been feeling good, sleeping more than usual and experencing  neck pain. Patient reports she left AMA from [facility] about two months ago. 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 Patient is currently in [facility] Department custody. Patient reports she has had fever but didn't measure. Denies taking any medication for the fever. Denies using heroin.

## 2018-01-10 NOTE — Discharge Instructions (Signed)
Patient has history of severe chronic medical problems due to applications of infections from prior drug abuse.  She will require close observation for fevers or signs of recurrence of historical illnesses. Patient has minor fingertip infection at this time.  There is no sign of abscess or pus collection.  She is to receive Bactrim and Keflex for the next 2 weeks. Patient has history of IV drug abuse and may experience withdrawal.  Medications are included for gastritis, nausea and general pain. Patient will need recheck with a primary care provider as soon as possible.

## 2018-01-20 ENCOUNTER — Emergency Department (HOSPITAL_COMMUNITY)
Admission: EM | Admit: 2018-01-20 | Discharge: 2018-01-20 | Disposition: A | Discharge status: Home / Self Care | Payer: Self-pay | Attending: Emergency Medicine | Admitting: Emergency Medicine

## 2018-01-20 ENCOUNTER — Emergency Department (HOSPITAL_COMMUNITY): Payer: Self-pay

## 2018-01-20 ENCOUNTER — Other Ambulatory Visit: Payer: Self-pay

## 2018-01-20 DIAGNOSIS — F17210 Nicotine dependence, cigarettes, uncomplicated: Secondary | ICD-10-CM | POA: Insufficient documentation

## 2018-01-20 DIAGNOSIS — M436 Torticollis: Secondary | ICD-10-CM

## 2018-01-20 DIAGNOSIS — R0789 Other chest pain: Secondary | ICD-10-CM

## 2018-01-20 DIAGNOSIS — Z79899 Other long term (current) drug therapy: Secondary | ICD-10-CM | POA: Insufficient documentation

## 2018-01-20 DIAGNOSIS — M542 Cervicalgia: Secondary | ICD-10-CM | POA: Insufficient documentation

## 2018-01-20 DIAGNOSIS — R110 Nausea: Secondary | ICD-10-CM | POA: Insufficient documentation

## 2018-01-20 DIAGNOSIS — R079 Chest pain, unspecified: Secondary | ICD-10-CM | POA: Insufficient documentation

## 2018-01-20 LAB — BASIC METABOLIC PANEL
Anion gap: 12 (ref 5–15)
BUN: 11 mg/dL (ref 6–20)
CO2: 20 mmol/L — ABNORMAL LOW (ref 22–32)
Calcium: 9.1 mg/dL (ref 8.9–10.3)
Chloride: 107 mmol/L (ref 98–111)
Creatinine, Ser: 0.58 mg/dL (ref 0.44–1.00)
GFR calc Af Amer: >60 mL/min (ref >60)
GFR calc non Af Amer: >60 mL/min (ref >60)
Glucose, Bld: 99 mg/dL (ref 70–99)
Potassium: 3.9 mmol/L (ref 3.5–5.1)
Sodium: 139 mmol/L (ref 135–145)

## 2018-01-20 LAB — CBC WITH DIFFERENTIAL/PLATELET
Basophils Absolute: 0 10*3/uL (ref 0.0–0.1)
Basophils Relative: 0 %
Eosinophils Absolute: 0.1 10*3/uL (ref 0.0–0.7)
Eosinophils Relative: 1 %
HCT: 42.8 % (ref 36.0–46.0)
Hemoglobin: 13.9 g/dL (ref 12.0–15.0)
Lymphocytes Relative: 42 %
Lymphs Abs: 4.3 10*3/uL — ABNORMAL HIGH (ref 0.7–4.0)
MCH: 25.7 pg — ABNORMAL LOW (ref 26.0–34.0)
MCHC: 32.5 g/dL (ref 30.0–36.0)
MCV: 79.3 fL (ref 78.0–100.0)
Monocytes Absolute: 1 10*3/uL (ref 0.1–1.0)
Monocytes Relative: 9 %
Neutro Abs: 5 10*3/uL (ref 1.7–7.7)
Neutrophils Relative %: 48 %
Platelets: 430 10*3/uL — ABNORMAL HIGH (ref 150–400)
RBC: 5.4 MIL/uL — ABNORMAL HIGH (ref 3.87–5.11)
RDW: 15.3 % (ref 11.5–15.5)
WBC: 10.3 10*3/uL (ref 4.0–10.5)

## 2018-01-20 MED ORDER — PROMETHAZINE HCL 25 MG/ML IJ SOLN
25.0000 mg | Freq: Once | INTRAMUSCULAR | Status: AC
  Administered 2018-01-20: 25 mg via INTRAVENOUS
  Filled 2018-01-20: qty 1

## 2018-01-20 MED ORDER — SODIUM CHLORIDE 0.9 % IV BOLUS
1000.0000 mL | Freq: Once | INTRAVENOUS | Status: AC
  Administered 2018-01-20: 1000 mL via INTRAVENOUS

## 2018-01-20 MED ORDER — LORAZEPAM 2 MG/ML IJ SOLN
1.0000 mg | Freq: Once | INTRAMUSCULAR | Status: AC
  Administered 2018-01-20: 1 mg via INTRAVENOUS
  Filled 2018-01-20: qty 1

## 2018-01-20 MED ORDER — GADOBENATE DIMEGLUMINE 529 MG/ML IV SOLN
10.0000 mL | Freq: Once | INTRAVENOUS | Status: AC | PRN
  Administered 2018-01-20: 10 mL via INTRAVENOUS

## 2018-01-20 MED ORDER — ONDANSETRON HCL 4 MG/2ML IJ SOLN
4.0000 mg | Freq: Once | INTRAMUSCULAR | Status: AC
  Administered 2018-01-20: 4 mg via INTRAVENOUS
  Filled 2018-01-20: qty 2

## 2018-01-20 NOTE — ED Provider Notes (Addendum)
[place] DEPT Provider Note   CSN: [id number] Arrival date & time: 01/20/18  0813     History   Chief Complaint Chief Complaint  Patient presents with  . Chest Pain  . Neck Pain  . Nausea    HPI Sherri Francis is a 32 y.o. female.  HPI Patient presents to the emergency department with neck pain nausea that now ongoing for quite a while.  Patient states she also will get these sharp pains in her chest from time to time.  The patient states that her stomach feels upset on a fairly consistent basis over the last several weeks.  Patient states that nothing seemed to make the condition better or worse.  Patient states she did not take any medications prior to arrival.  Patient states that she was in the hospital for presumed bone infection in the cervical spine.  The patient denies chest pain, shortness of  fever, cough, weakness, numbness, dizziness, anorexia, edema, abdominal pain, nausea, vomiting, diarrhea, rash, back pain, dysuria, hematemesis, bloody stool, near syncope, or syncope. Past Medical History:  Diagnosis Date  . Anemia   . Anxiety   . Depression   . Endocarditis   . Hepatitis C   . IVDU (intravenous drug user)   . Osteomyelitis of cervical spine ([place]) 09/2017  . PID (acute pelvic inflammatory disease)   . Suicide attempt [facility])    multiple attempts  . Teratoma   . UTI (urinary tract infection)     Patient Active Problem List   Diagnosis Date Noted  . Hypokalemia 10/02/2017  . Bacteremia 10/02/2017  . Anxiety and depression   . Osteomyelitis of cervical spine ([place]) 09/22/2017  . IV drug user   . Acute osteomyelitis of cervical spine ([place]) 09/20/2017  . Opioid abuse with opioid-induced mood disorder ([place]) 02/21/2017  . Fever 02/20/2017  . Anemia 02/20/2017  . Acute bacterial endocarditis   . Substance induced mood disorder ([place]) 01/30/2017  . Opioid type dependence, continuous ([place]) 01/30/2017  . Opioid-induced anxiety  disorder with moderate or severe use disorder with onset during withdrawal ([place]) 01/28/2017  . Bacteremia due to Staphylococcus aureus 01/27/2017  . Pneumonia 01/24/2017  . Pelvic abscess in female 01/13/2016  . PID (acute pelvic inflammatory disease) 01/13/2016    Past Surgical History:  Procedure Laterality Date  . LAPAROSCOPY    . TEE WITHOUT CARDIOVERSION N/A 09/27/2017   Procedure: TRANSESOPHAGEAL ECHOCARDIOGRAM (TEE);  Surgeon: Pixie Casino, MD;  Location: [facility] ENDOSCOPY;  Service: Cardiovascular;  Laterality: N/A;     OB History    Gravida  0   Para  0   Term  0   Preterm  0   AB  0   Living  0     SAB  0   TAB  0   Ectopic  0   Multiple  0   Live Births  0            Home Medications    Prior to Admission medications   Medication Sig Start Date End Date Taking? Authorizing Provider  acetaminophen (TYLENOL) 500 MG tablet Take 1,000 mg by mouth every 6 (six) hours as needed for mild pain.   Yes [provider]  famotidine (PEPCID) 20 MG tablet Take 1 tablet (20 mg total) by mouth 2 (two) times daily as needed for heartburn or indigestion. 01/10/18  Yes Charlesetta Shanks, MD  ibuprofen (ADVIL,MOTRIN) 600 MG tablet Take 1 tablet (600 mg total) by mouth every  6 (six) hours as needed. Patient taking differently: Take 600 mg by mouth every 6 (six) hours as needed for moderate pain.  01/10/18  Yes Charlesetta Shanks, MD  loperamide (IMODIUM A-D) 2 MG tablet Take 2 mg by mouth daily as needed for diarrhea or loose stools.   Yes [provider]  meclizine (ANTIVERT) 12.5 MG tablet Take 12.5 mg by mouth daily as needed for dizziness.   Yes [provider]  ondansetron (ZOFRAN ODT) 4 MG disintegrating tablet Take 1 tablet (4 mg total) by mouth every 4 (four) hours as needed for nausea or vomiting. 01/10/18  Yes Pfeiffer, Jeannie Done, MD  cephALEXin (KEFLEX) 500 MG capsule Take 2 capsules (1,000 mg total) by mouth 2 (two) times daily. Patient not taking:  Reported on 01/20/2018 01/10/18   Charlesetta Shanks, MD  doxycycline (VIBRAMYCIN) 100 MG capsule Take 1 capsule (100 mg total) by mouth 2 (two) times daily. One po bid x 7 days Patient not taking: Reported on 01/20/2018 01/10/18   Charlesetta Shanks, MD    Family History Family History  Problem Relation Age of Onset  . Heart disease Maternal Grandfather   . Anxiety disorder Maternal Grandfather   . Heart disease Maternal Aunt   . Alcohol abuse Father   . Drug abuse Father   . Cancer Neg Hx     Social History Social History   Tobacco Use  . Smoking status: Current Every Day Smoker    Packs/day: 0.50    Types: Cigarettes  . Smokeless tobacco: Never Used  Substance Use Topics  . Alcohol use: Yes    Comment: Recovering, 3 years ago   . Drug use: Yes    Types: IV, Cocaine, Methamphetamines    Comment: Heronin: 2 days ago, Methamphetamines: 2 days ago, Deneis cocaine and marijuaina use      Allergies   Patient has no known allergies.   Review of Systems Review of Systems   Physical Exam Updated Vital Signs BP (!) 146/104 (BP Location: Left Arm)   Pulse 85   Temp 97.7 F (36.5 C) (Oral)   Resp 18   Ht 5\' 1"  (1.549 m)   Wt 49.9 kg   LMP 01/13/2018 (Approximate)   SpO2 100%   BMI 20.78 kg/m   Physical Exam  Constitutional: She is oriented to person, place, and time. She appears well-developed and well-nourished. No distress.  HENT:  Head: Normocephalic and atraumatic.  Mouth/Throat: Oropharynx is clear and moist.  Eyes: Pupils are equal, round, and reactive to light.  Neck: Normal range of motion. Neck supple.  Cardiovascular: Normal rate, regular rhythm and normal heart sounds. Exam reveals no gallop and no friction rub.  No murmur heard. Pulmonary/Chest: Effort normal and breath sounds normal. No respiratory distress. She has no wheezes.  Abdominal: Soft. Bowel sounds are normal. She exhibits no distension. There is no tenderness.  Neurological: She is alert and  oriented to person, place, and time. She has normal strength and normal reflexes. No sensory deficit. She exhibits normal muscle tone. Coordination and gait normal.  Skin: Skin is warm and dry. Capillary refill takes less than 2 seconds. No abrasion, no ecchymosis and no rash noted. She is not diaphoretic. No erythema. No pallor.  Psychiatric: She has a normal mood and affect. Her behavior is normal.  Nursing note and vitals reviewed.    ED Treatments / Results  Labs (all labs ordered are listed, but only abnormal results are displayed) Labs Reviewed  BASIC METABOLIC PANEL - Abnormal;  Notable for the following components:      Result Value   CO2 20 (*)    All other components within normal limits  CBC WITH DIFFERENTIAL/PLATELET - Abnormal; Notable for the following components:   RBC 5.40 (*)    MCH 25.7 (*)    Platelets 430 (*)    Lymphs Abs 4.3 (*)    All other components within normal limits    EKG EKG Interpretation  Date/Time:  Thursday January 20 2018 09:15:56 EDT Ventricular Rate:  83 PR Interval:    QRS Duration: 78 QT Interval:  379 QTC Calculation: 446 R Axis:   90 Text Interpretation:  Sinus rhythm Borderline right axis deviation No significant change since last tracing Confirmed by Merrily Pew [phone]) on 01/20/2018 12:41:50 PM   Radiology Ct Cervical Spine Wo Contrast  Result Date: 01/20/2018 CLINICAL DATA:  Nausea, vomiting and chills in a patient with a history of IV drug abuse and abnormal fluid collection in the prevertebral soft tissues of the upper cervical spine EXAM: CT CERVICAL SPINE WITHOUT CONTRAST TECHNIQUE: Multidetector CT imaging of the cervical spine was performed without intravenous contrast. Multiplanar CT image reconstructions were also generated. COMPARISON:  MRI cervical spine 09/20/2017. FINDINGS: Alignment: Maintained with mild reversal of lordosis noted. Skull base and vertebrae: No acute fracture. No primary bone lesion or focal pathologic  process. Soft tissues and spinal canal: A small amount of prevertebral fluid is seen centered at the C4 level and measuring approximately 0.9 cm transverse by 0.2 cm AP by 1.7 cm craniocaudal. Disc levels: Intervertebral disc space height is maintained. The central canal appears open. Upper chest: Lung apices are clear. Other: None. IMPRESSION: Small volume of prevertebral soft tissue fluid centered at the C4 level appears decreased compared to the prior MRI. There is no CT evidence of discitis or osteomyelitis but MRI with and without contrast is much more sensitive for the evaluation of spine infection. If clinical concern persists, MRI is recommended. Electronically Signed   By: Inge Rise M.D.   On: 01/20/2018 11:48    Procedures Procedures (including critical care time)  Medications Ordered in ED Medications  sodium chloride 0.9 % bolus 1,000 mL (0 mLs Intravenous Stopped 01/20/18 1216)  ondansetron (ZOFRAN) injection 4 mg (4 mg Intravenous Given 01/20/18 0938)  promethazine (PHENERGAN) injection 25 mg (25 mg Intravenous Given 01/20/18 1218)  sodium chloride 0.9 % bolus 1,000 mL ( Intravenous Stopped 01/20/18 1348)     Initial Impression / Assessment and Plan / ED Course  I have reviewed the triage vital signs and the nursing notes.  Pertinent labs & imaging results that were available during my care of the patient were reviewed by me and considered in my medical decision making (see chart for details).     I spoke with the radiologist about the patient's MRI findings from April and he was stating that he does not see any signs of discitis or osteomyelitis on those images but there was some fluid collection in the prevertebral space and was wondering if she had any pharyngitis at that time.  The patient will have CT scan performed.  CT scan was done and showed that the patient had a lesser fluid collection and soft tissue area in the prevertebral space but there was a recommendation  for further MRI imaging.  Patient has been stable here in the emergency department.  Otherwise she does not have any abnormalities on her testing or vital signs at this time.  She is been  given 2 L of fluid along with antiemetics.  Final Clinical Impressions(s) / ED Diagnoses   Final diagnoses:  None    ED Discharge Orders    None       Dalia Heading, PA-C 01/20/18 1623    Prabhleen Montemayor, [place], PA-C 01/20/18 1625    Mesner, Corene Cornea, MD 01/26/18 1901

## 2018-01-20 NOTE — ED Notes (Signed)
Pt states she feels nauseated and terrible. Pt would like an update on her work up.  Pt tearful.  Gerald Stabs PA made aware.

## 2018-01-20 NOTE — ED Provider Notes (Signed)
Physical Exam  BP (!) 146/104 (BP Location: Left Arm)   Pulse 85   Temp 97.7 F (36.5 C) (Oral)   Resp 18   Ht 5\' 1"  (1.549 m)   Wt 49.9 kg   LMP 01/13/2018 (Approximate)   SpO2 100%   BMI 20.78 kg/m   Physical Exam  Constitutional: She appears well-developed and well-nourished. No distress.  HENT:  Head: Normocephalic and atraumatic.  Eyes: Conjunctivae and EOM are normal. No scleral icterus.  Neck: Normal range of motion.  Pulmonary/Chest: Effort normal. No respiratory distress.  Neurological: She is alert.  Skin: No rash noted. She is not diaphoretic.  Psychiatric: She has a normal mood and affect.  Nursing note and vitals reviewed.   ED Course/Procedures     Procedures   Dg Chest 2 View  Result Date: 01/10/2018 CLINICAL DATA:  Chest pain and endocarditis EXAM: CHEST - 2 VIEW COMPARISON:  09/20/2017 FINDINGS: The heart size and mediastinal contours are within normal limits. Both lungs are clear. The visualized skeletal structures are unremarkable. IMPRESSION: Normal chest. Electronically Signed   By: Ulyses Jarred M.D.   On: 01/10/2018 15:20   Ct Cervical Spine Wo Contrast  Result Date: 01/20/2018 CLINICAL DATA:  Nausea, vomiting and chills in a patient with a history of IV drug abuse and abnormal fluid collection in the prevertebral soft tissues of the upper cervical spine EXAM: CT CERVICAL SPINE WITHOUT CONTRAST TECHNIQUE: Multidetector CT imaging of the cervical spine was performed without intravenous contrast. Multiplanar CT image reconstructions were also generated. COMPARISON:  MRI cervical spine 09/20/2017. FINDINGS: Alignment: Maintained with mild reversal of lordosis noted. Skull base and vertebrae: No acute fracture. No primary bone lesion or focal pathologic process. Soft tissues and spinal canal: A small amount of prevertebral fluid is seen centered at the C4 level and measuring approximately 0.9 cm transverse by 0.2 cm AP by 1.7 cm craniocaudal. Disc levels:  Intervertebral disc space height is maintained. The central canal appears open. Upper chest: Lung apices are clear. Other: None. IMPRESSION: Small volume of prevertebral soft tissue fluid centered at the C4 level appears decreased compared to the prior MRI. There is no CT evidence of discitis or osteomyelitis but MRI with and without contrast is much more sensitive for the evaluation of spine infection. If clinical concern persists, MRI is recommended. Electronically Signed   By: Inge Rise M.D.   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 Evaluate for worsening disease. EXAM: MRI CERVICAL SPINE WITHOUT AND WITH CONTRAST TECHNIQUE: Multiplanar and multiecho pulse sequences of the cervical spine, to include the craniocervical junction and cervicothoracic junction, were obtained without and with intravenous contrast. CONTRAST:  49mL MULTIHANCE GADOBENATE DIMEGLUMINE 529 MG/ML IV SOLN COMPARISON:  09/20/2017 cervical spine radiographs. 01/20/2018 CT cervical spine. FINDINGS: Alignment: Physiologic. Vertebrae: No fracture, evidence of discitis, or bone lesion. No abnormal enhancement. Decreased T1 signal within the anterior aspect of the C4 vertebral body with a small Schmorl's node and adjacent sclerosis on prior CT. Cord: Normal signal and morphology. Posterior Fossa, vertebral arteries, paraspinal tissues: Negative. No prevertebral edema or epidural fluid collection. Disc levels: No significant disc displacement, foraminal stenosis, or canal stenosis. IMPRESSION: No findings of discitis osteomyelitis at this time. Resolution of prevertebral edema. Normal cord signal. Electronically Signed   By: [name] M.D.   On: 01/20/2018 17:41  MDM  Care handed off from previous provider, C.Lawyer, PA-C.  Please see their note for further detail.  Briefly, patient with a past  medical history of hepatitis C, endocarditis, IV drug use presents to ED from jail with neck pain, nausea for the past several months.  She has been seen and evaluated in the ED multiple times for similar symptoms.  There is a question of possible osteomyelitis, discitis in her cervical spine.  Lab work here including BMP, CBC, EKG unremarkable.  She is afebrile.  Other vital signs are within normal limits.  CT of the cervical spine shows fluid collection although decreased from prior.  They recommended MRI of the cervical spine.  Will discharge home if MRI is negative.  She is currently on p.o. antibiotics.  MRI of the cervical spine returned as negative for osteomyelitis, discitis or other acute abnormality, with resolution of the prevertebral edema that was previously seen. I do not feel at this time that she needs to continue her p.o. antibiotics.  Patient asked me if she could be admitted to the hospital for 2 days to "just get back on track."  I told her that there is no medical reason for her to be admitted to the hospital.  Will advise her to return to ED for any severe worsening symptoms.  Portions of this note were generated with [employer]. Dictation errors may occur despite best attempts at proofreading.    Delia Heady, PA-C 01/20/18 1811    Nat Christen, MD 01/25/18 [phone]

## 2018-01-20 NOTE — Discharge Instructions (Signed)
Return to ED for worsening symptoms, trouble breathing or trouble swallowing, coughing or vomiting up blood, lightheadedness or loss of consciousness.

## 2018-01-20 NOTE — ED Triage Notes (Signed)
Patient dx with osteomyelitis, c-spine. Patient was supposed to stay in hospital for 8 weeks and for IV abx and left AMA. Patient states she has a hx of endocarditis, recently experiencing N/V, chills. Patient was taking oral abx at jail but states they were making her vomit. Patient reports chest pain.

## 2018-06-03 ENCOUNTER — Emergency Department (HOSPITAL_COMMUNITY): Payer: Self-pay

## 2018-06-03 ENCOUNTER — Encounter (HOSPITAL_COMMUNITY): Payer: Self-pay | Admitting: Emergency Medicine

## 2018-06-03 ENCOUNTER — Inpatient Hospital Stay (HOSPITAL_COMMUNITY)
Admission: EM | Admit: 2018-06-03 | Discharge: 2018-06-04 | DRG: 690 | Discharge status: Against Medical Advice | Payer: Self-pay | Attending: Family Medicine | Admitting: Family Medicine

## 2018-06-03 DIAGNOSIS — F1910 Other psychoactive substance abuse, uncomplicated: Secondary | ICD-10-CM

## 2018-06-03 DIAGNOSIS — N739 Female pelvic inflammatory disease, unspecified: Secondary | ICD-10-CM | POA: Diagnosis present

## 2018-06-03 DIAGNOSIS — Z5329 Procedure and treatment not carried out because of patient's decision for other reasons: Secondary | ICD-10-CM | POA: Diagnosis not present

## 2018-06-03 DIAGNOSIS — F419 Anxiety disorder, unspecified: Secondary | ICD-10-CM

## 2018-06-03 DIAGNOSIS — D72820 Lymphocytosis (symptomatic): Secondary | ICD-10-CM | POA: Diagnosis present

## 2018-06-03 DIAGNOSIS — F17210 Nicotine dependence, cigarettes, uncomplicated: Secondary | ICD-10-CM | POA: Diagnosis present

## 2018-06-03 DIAGNOSIS — Z79899 Other long term (current) drug therapy: Secondary | ICD-10-CM

## 2018-06-03 DIAGNOSIS — R000 Tachycardia, unspecified: Secondary | ICD-10-CM

## 2018-06-03 DIAGNOSIS — K219 Gastro-esophageal reflux disease without esophagitis: Secondary | ICD-10-CM | POA: Diagnosis present

## 2018-06-03 DIAGNOSIS — F418 Other specified anxiety disorders: Secondary | ICD-10-CM | POA: Diagnosis present

## 2018-06-03 DIAGNOSIS — R509 Fever, unspecified: Secondary | ICD-10-CM

## 2018-06-03 DIAGNOSIS — F1990 Other psychoactive substance use, unspecified, uncomplicated: Secondary | ICD-10-CM

## 2018-06-03 DIAGNOSIS — D509 Iron deficiency anemia, unspecified: Secondary | ICD-10-CM

## 2018-06-03 DIAGNOSIS — Z8249 Family history of ischemic heart disease and other diseases of the circulatory system: Secondary | ICD-10-CM

## 2018-06-03 DIAGNOSIS — F1410 Cocaine abuse, uncomplicated: Secondary | ICD-10-CM | POA: Diagnosis present

## 2018-06-03 DIAGNOSIS — F329 Major depressive disorder, single episode, unspecified: Secondary | ICD-10-CM

## 2018-06-03 DIAGNOSIS — M869 Osteomyelitis, unspecified: Secondary | ICD-10-CM | POA: Diagnosis present

## 2018-06-03 DIAGNOSIS — Z8679 Personal history of other diseases of the circulatory system: Secondary | ICD-10-CM

## 2018-06-03 DIAGNOSIS — Z720 Tobacco use: Secondary | ICD-10-CM | POA: Diagnosis present

## 2018-06-03 DIAGNOSIS — F1510 Other stimulant abuse, uncomplicated: Secondary | ICD-10-CM | POA: Diagnosis present

## 2018-06-03 DIAGNOSIS — A419 Sepsis, unspecified organism: Secondary | ICD-10-CM

## 2018-06-03 DIAGNOSIS — Z811 Family history of alcohol abuse and dependence: Secondary | ICD-10-CM

## 2018-06-03 DIAGNOSIS — F32A Depression, unspecified: Secondary | ICD-10-CM | POA: Diagnosis present

## 2018-06-03 DIAGNOSIS — N10 Acute tubulo-interstitial nephritis: Principal | ICD-10-CM

## 2018-06-03 DIAGNOSIS — I38 Endocarditis, valve unspecified: Secondary | ICD-10-CM | POA: Diagnosis present

## 2018-06-03 DIAGNOSIS — Z813 Family history of other psychoactive substance abuse and dependence: Secondary | ICD-10-CM

## 2018-06-03 DIAGNOSIS — E871 Hypo-osmolality and hyponatremia: Secondary | ICD-10-CM

## 2018-06-03 DIAGNOSIS — N12 Tubulo-interstitial nephritis, not specified as acute or chronic: Secondary | ICD-10-CM

## 2018-06-03 DIAGNOSIS — Z8619 Personal history of other infectious and parasitic diseases: Secondary | ICD-10-CM

## 2018-06-03 DIAGNOSIS — Z791 Long term (current) use of non-steroidal anti-inflammatories (NSAID): Secondary | ICD-10-CM

## 2018-06-03 LAB — CBC WITH DIFFERENTIAL/PLATELET
ABS IMMATURE GRANULOCYTES: 0.24 10*3/uL — AB (ref 0.00–0.07)
Basophils Absolute: 0 10*3/uL (ref 0.0–0.1)
Basophils Relative: 0 %
Eosinophils Absolute: 0.1 10*3/uL (ref 0.0–0.5)
Eosinophils Relative: 0 %
HCT: 31.1 % — ABNORMAL LOW (ref 36.0–46.0)
Hemoglobin: 9.9 g/dL — ABNORMAL LOW (ref 12.0–15.0)
IMMATURE GRANULOCYTES: 1 %
Lymphocytes Relative: 3 %
Lymphs Abs: 0.8 10*3/uL (ref 0.7–4.0)
MCH: 24.5 pg — ABNORMAL LOW (ref 26.0–34.0)
MCHC: 31.8 g/dL (ref 30.0–36.0)
MCV: 77 fL — ABNORMAL LOW (ref 80.0–100.0)
Monocytes Absolute: 2.3 10*3/uL — ABNORMAL HIGH (ref 0.1–1.0)
Monocytes Relative: 9 %
NEUTROS ABS: 23.1 10*3/uL — AB (ref 1.7–7.7)
Neutrophils Relative %: 87 %
PLATELETS: 313 10*3/uL (ref 150–400)
RBC: 4.04 MIL/uL (ref 3.87–5.11)
RDW: 14.5 % (ref 11.5–15.5)
WBC: 26.6 10*3/uL — ABNORMAL HIGH (ref 4.0–10.5)
nRBC: 0 % (ref 0.0–0.2)

## 2018-06-03 LAB — COMPREHENSIVE METABOLIC PANEL
ALT: 32 U/L (ref 0–44)
AST: 19 U/L (ref 15–41)
Albumin: 3.1 g/dL — ABNORMAL LOW (ref 3.5–5.0)
Alkaline Phosphatase: 74 U/L (ref 38–126)
Anion gap: 12 (ref 5–15)
BUN: 12 mg/dL (ref 6–20)
CHLORIDE: 93 mmol/L — AB (ref 98–111)
CO2: 23 mmol/L (ref 22–32)
Calcium: 8.3 mg/dL — ABNORMAL LOW (ref 8.9–10.3)
Creatinine, Ser: 0.73 mg/dL (ref 0.44–1.00)
GFR calc Af Amer: >60 mL/min (ref >60)
GFR calc non Af Amer: >60 mL/min (ref >60)
Glucose, Bld: 141 mg/dL — ABNORMAL HIGH (ref 70–99)
Potassium: 3.8 mmol/L (ref 3.5–5.1)
Sodium: 128 mmol/L — ABNORMAL LOW (ref 135–145)
Total Bilirubin: 0.9 mg/dL (ref 0.3–1.2)
Total Protein: 7 g/dL (ref 6.5–8.1)

## 2018-06-03 LAB — RAPID HIV SCREEN (HIV 1/2 AB+AG)
HIV 1/2 Antibodies: NONREACTIVE
HIV-1 P24 Antigen - HIV24: NONREACTIVE

## 2018-06-03 LAB — INFLUENZA PANEL BY PCR (TYPE A & B)
INFLAPCR: NEGATIVE
Influenza B By PCR: NEGATIVE

## 2018-06-03 LAB — MAGNESIUM: Magnesium: 1.9 mg/dL (ref 1.7–2.4)

## 2018-06-03 LAB — WET PREP, GENITAL
Clue Cells Wet Prep HPF POC: NONE SEEN
Sperm: NONE SEEN
Trich, Wet Prep: NONE SEEN
Yeast Wet Prep HPF POC: NONE SEEN

## 2018-06-03 LAB — LIPASE, BLOOD: Lipase: 14 U/L (ref 11–51)

## 2018-06-03 LAB — URINALYSIS, ROUTINE W REFLEX MICROSCOPIC
Bilirubin Urine: NEGATIVE
Glucose, UA: NEGATIVE mg/dL
Ketones, ur: 5 mg/dL — AB
Nitrite: NEGATIVE
PROTEIN: 100 mg/dL — AB
Specific Gravity, Urine: 1.014 (ref 1.005–1.030)
pH: 6 (ref 5.0–8.0)

## 2018-06-03 LAB — I-STAT CG4 LACTIC ACID, ED: Lactic Acid, Venous: 1.27 mmol/L (ref 0.5–1.9)

## 2018-06-03 LAB — PREGNANCY, URINE: Preg Test, Ur: NEGATIVE

## 2018-06-03 MED ORDER — IOPAMIDOL (ISOVUE-300) INJECTION 61%
100.0000 mL | Freq: Once | INTRAVENOUS | Status: AC | PRN
  Administered 2018-06-03: 100 mL via INTRAVENOUS

## 2018-06-03 MED ORDER — SODIUM CHLORIDE 0.9 % IV SOLN
INTRAVENOUS | Status: DC
  Administered 2018-06-03 – 2018-06-04 (×2): via INTRAVENOUS

## 2018-06-03 MED ORDER — SODIUM CHLORIDE 0.9 % IV SOLN
1.0000 g | INTRAVENOUS | Status: DC
  Filled 2018-06-03: qty 10

## 2018-06-03 MED ORDER — ZOLPIDEM TARTRATE 5 MG PO TABS: 5.0000 mg | ORAL_TABLET | Freq: Every evening | ORAL | Status: DC | PRN

## 2018-06-03 MED ORDER — LOPERAMIDE HCL 2 MG PO CAPS: 2.0000 mg | ORAL_CAPSULE | Freq: Every day | ORAL | Status: DC | PRN

## 2018-06-03 MED ORDER — NICOTINE 21 MG/24HR TD PT24
21.0000 mg | MEDICATED_PATCH | Freq: Every day | TRANSDERMAL | Status: DC
  Filled 2018-06-03 (×2): qty 1

## 2018-06-03 MED ORDER — ENOXAPARIN SODIUM 40 MG/0.4ML ~~LOC~~ SOLN: 40.0000 mg | Freq: Every day | SUBCUTANEOUS | Status: DC

## 2018-06-03 MED ORDER — KETOROLAC TROMETHAMINE 15 MG/ML IJ SOLN
15.0000 mg | Freq: Four times a day (QID) | INTRAMUSCULAR | Status: DC | PRN
  Administered 2018-06-03 – 2018-06-04 (×2): 15 mg via INTRAVENOUS
  Filled 2018-06-03 (×2): qty 1

## 2018-06-03 MED ORDER — SODIUM CHLORIDE 0.9 % IV SOLN
1.0000 g | Freq: Once | INTRAVENOUS | Status: AC
  Administered 2018-06-03: 1 g via INTRAVENOUS
  Filled 2018-06-03: qty 10

## 2018-06-03 MED ORDER — ONDANSETRON HCL 4 MG/2ML IJ SOLN
4.0000 mg | Freq: Once | INTRAMUSCULAR | Status: AC
  Administered 2018-06-03: 4 mg via INTRAVENOUS
  Filled 2018-06-03: qty 2

## 2018-06-03 MED ORDER — ACETAMINOPHEN 325 MG PO TABS: 650.0000 mg | ORAL_TABLET | Freq: Four times a day (QID) | ORAL | Status: DC | PRN

## 2018-06-03 MED ORDER — FAMOTIDINE 20 MG PO TABS: 20.0000 mg | ORAL_TABLET | Freq: Two times a day (BID) | ORAL | Status: DC | PRN

## 2018-06-03 MED ORDER — ONDANSETRON HCL 4 MG/2ML IJ SOLN
4.0000 mg | Freq: Four times a day (QID) | INTRAMUSCULAR | Status: DC | PRN
  Administered 2018-06-03: 4 mg via INTRAVENOUS
  Filled 2018-06-03: qty 2

## 2018-06-03 MED ORDER — ACETAMINOPHEN 500 MG PO TABS
1000.0000 mg | ORAL_TABLET | Freq: Once | ORAL | Status: DC
  Administered 2018-06-03: 1000 mg via ORAL
  Filled 2018-06-03: qty 2

## 2018-06-03 MED ORDER — SODIUM CHLORIDE 0.9 % IV BOLUS
1000.0000 mL | Freq: Once | INTRAVENOUS | Status: AC
  Administered 2018-06-03: 1000 mL via INTRAVENOUS

## 2018-06-03 MED ORDER — VANCOMYCIN HCL IN DEXTROSE 1-5 GM/200ML-% IV SOLN
1000.0000 mg | Freq: Once | INTRAVENOUS | Status: AC
  Administered 2018-06-03: 1000 mg via INTRAVENOUS
  Filled 2018-06-03: qty 200

## 2018-06-03 MED ORDER — LORAZEPAM 2 MG/ML IJ SOLN
0.5000 mg | Freq: Four times a day (QID) | INTRAMUSCULAR | Status: DC | PRN
  Administered 2018-06-03 – 2018-06-04 (×2): 0.5 mg via INTRAVENOUS
  Filled 2018-06-03 (×3): qty 1

## 2018-06-03 MED ORDER — ONDANSETRON HCL 4 MG PO TABS: 4.0000 mg | ORAL_TABLET | Freq: Four times a day (QID) | ORAL | Status: DC | PRN

## 2018-06-03 MED ORDER — ACETAMINOPHEN 650 MG RE SUPP: 650.0000 mg | Freq: Four times a day (QID) | RECTAL | Status: DC | PRN

## 2018-06-03 MED ORDER — IBUPROFEN 400 MG PO TABS: 400.0000 mg | ORAL_TABLET | Freq: Four times a day (QID) | ORAL | Status: DC | PRN

## 2018-06-03 MED ORDER — LACTATED RINGERS IV BOLUS
1000.0000 mL | Freq: Once | INTRAVENOUS | Status: AC
  Administered 2018-06-03: 1000 mL via INTRAVENOUS

## 2018-06-03 NOTE — H&P (Signed)
History and Physical    Sherri Francis PXT:[id number] DOB: [birth year] DOA: 06/03/2018  Referring MD/NP/PA:   PCP: Patient, No Pcp Per   Patient coming from:  The patient is coming from home.  At baseline, pt is independent for most of ADL.        Chief Complaint: Dysuria, fever, chills, right flank pain  HPI: Sherri Francis is a 32 y.o. female with medical history significant of polysubstance abuse (cocaine, methamphetamine, heroin, tobacco), GERD, depression with anxiety, anemia, HCV, endocarditis, C-spine osteomyelitis, PID, who presents with dysuria, fever, chills, right flank pain.  Patient states that she has been having fever, chills, right flank pain in the past 3 days. She also has dysuria, burning on urination, increased urinary frequency.  She has body aches and generalized weakness.  Denies cough, chest pain, shortness of breath. Pt has nausea and vomited several times, but no diarrhea or abdominal pain, symptoms of UTI or unilateral weakness.  Patient states that her previous endocarditis and C-spine osteomyelitis had resolved.  Currently patient does not have any neck pain.  No unilateral weakness.  Patient states that she used heroin yesterday.  ED Course: pt was found to have positive urinalysis (cloudy appearance, moderate amount of leukocyte, many bacteria, WBC 21-50), lipase 14, WBC 26.6, lactic acid 1.27, sodium 128, creatinine and BUN normal, temperature well 1.6, tachycardia, tachypnea, oxygen saturation 98% on room air.  Chest x-ray is negative.  CT abdomen/pelvis is suggestive for pyelonephritis.  Patient is admitted to telemetry bed as inpatient.  Review of Systems:   General: Has fevers, chills, no body weight gain, has poor appetite, has fatigue HEENT: no blurry vision, hearing changes or sore throat Respiratory: no dyspnea, coughing, wheezing CV: no chest pain, no palpitations GI: has nausea, vomiting, no abdominal pain, diarrhea, constipation GU: has dysuria, burning  on urination, increased urinary frequency, no hematuria. Has right flank pain Ext: no leg edema Neuro: no unilateral weakness, numbness, or tingling, no vision change or hearing loss Skin: no rash, no skin tear. MSK: No muscle spasm, no deformity, no limitation of range of movement in spin Heme: No easy bruising.  Travel history: No recent long distant travel.  Allergy: No Known Allergies  Past Medical History:  Diagnosis Date  . Anemia   . Anxiety   . Depression   . Endocarditis   . Hepatitis C   . IVDU (intravenous drug user)   . Osteomyelitis of cervical spine ([place]) 09/2017  . PID (acute pelvic inflammatory disease)   . Suicide attempt [facility])    multiple attempts  . Teratoma   . UTI (urinary tract infection)     Past Surgical History:  Procedure Laterality Date  . LAPAROSCOPY    . TEE WITHOUT CARDIOVERSION N/A 09/27/2017   Procedure: TRANSESOPHAGEAL ECHOCARDIOGRAM (TEE);  Surgeon: Pixie Casino, MD;  Location: [facility] ENDOSCOPY;  Service: Cardiovascular;  Laterality: N/A;    Social History:  reports that she has been smoking cigarettes. She has been smoking about 0.50 packs per day. She has never used smokeless tobacco. She reports current alcohol use. She reports current drug use. Drugs: IV, Cocaine, and Methamphetamines.  Family History:  Family History  Problem Relation Age of Onset  . Heart disease Maternal Grandfather   . Anxiety disorder Maternal Grandfather   . Heart disease Maternal Aunt   . Alcohol abuse Father   . Drug abuse Father   . Cancer Neg Hx      Prior to Admission medications   Medication Sig  Start Date End Date Taking? Authorizing Provider  acetaminophen (TYLENOL) 500 MG tablet Take 1,000 mg by mouth every 6 (six) hours as needed for mild pain.    [provider]  cephALEXin (KEFLEX) 500 MG capsule Take 2 capsules (1,000 mg total) by mouth 2 (two) times daily. Patient not taking: Reported on 01/20/2018 01/10/18   Charlesetta Shanks, MD    doxycycline (VIBRAMYCIN) 100 MG capsule Take 1 capsule (100 mg total) by mouth 2 (two) times daily. One po bid x 7 days Patient not taking: Reported on 01/20/2018 01/10/18   Charlesetta Shanks, MD  famotidine (PEPCID) 20 MG tablet Take 1 tablet (20 mg total) by mouth 2 (two) times daily as needed for heartburn or indigestion. 01/10/18   Charlesetta Shanks, MD  ibuprofen (ADVIL,MOTRIN) 600 MG tablet Take 1 tablet (600 mg total) by mouth every 6 (six) hours as needed. Patient taking differently: Take 600 mg by mouth every 6 (six) hours as needed for moderate pain.  01/10/18   Charlesetta Shanks, MD  loperamide (IMODIUM A-D) 2 MG tablet Take 2 mg by mouth daily as needed for diarrhea or loose stools.    [provider]  meclizine (ANTIVERT) 12.5 MG tablet Take 12.5 mg by mouth daily as needed for dizziness.    [provider]  ondansetron (ZOFRAN ODT) 4 MG disintegrating tablet Take 1 tablet (4 mg total) by mouth every 4 (four) hours as needed for nausea or vomiting. 01/10/18   Charlesetta Shanks, MD    Physical Exam: Vitals:   06/03/18 1715 06/03/18 2226 06/03/18 2244 06/04/18 0048  BP: 121/71 (!) 142/97 (!) 138/92 125/69  Pulse: (!) 114 (!) 125 (!) 118 (!) 132  Resp:  20 18 18   Temp:  (!) 100.7 F (38.2 C) 100.2 F (37.9 C) (!) 100.8 F (38.2 C)  TempSrc:  Oral Axillary Oral  SpO2: 98% 97% 99% 96%   General: Not in acute distress HEENT:       Eyes: PERRL, EOMI, no scleral icterus.       ENT: No discharge from the ears and nose, no pharynx injection, no tonsillar enlargement.        Neck: No JVD, no bruit, no mass felt. Heme: No neck lymph node enlargement. Cardiac: S1/S2, RRR, No murmurs, No gallops or rubs. Respiratory: No rales, wheezing, rhonchi or rubs. GI: Soft, nondistended, nontender, no rebound pain, no organomegaly, BS present. GU: No hematuria. Positive R CVA tenderness Ext: No pitting leg edema bilaterally. 2+DP/PT pulse bilaterally. Musculoskeletal: No joint deformities,  No joint redness or warmth, no limitation of ROM in spin. Skin: No rashes.  Neuro: Alert, oriented X3, cranial nerves II-XII grossly intact, moves all extremities normally.  Psych: Patient is not psychotic, no suicidal or hemocidal ideation.  Labs on Admission: I have personally reviewed following labs and imaging studies  CBC: Recent Labs  Lab 06/03/18 1728  WBC 26.6*  NEUTROABS 23.1*  HGB 9.9*  HCT 31.1*  MCV 77.0*  PLT 916   Basic Metabolic Panel: Recent Labs  Lab 06/03/18 1728  NA 128*  K 3.8  CL 93*  CO2 23  GLUCOSE 141*  BUN 12  CREATININE 0.73  CALCIUM 8.3*  MG 1.9   GFR: CrCl cannot be calculated (Unknown ideal weight.). Liver Function Tests: Recent Labs  Lab 06/03/18 1728  AST 19  ALT 32  ALKPHOS 74  BILITOT 0.9  PROT 7.0  ALBUMIN 3.1*   Recent Labs  Lab 06/03/18 1728  LIPASE 14   No  results for input(s): AMMONIA in the last 168 hours. Coagulation Profile: No results for input(s): INR, PROTIME in the last 168 hours. Cardiac Enzymes: No results for input(s): CKTOTAL, CKMB, CKMBINDEX, TROPONINI in the last 168 hours. BNP (last 3 results) No results for input(s): PROBNP in the last 8760 hours. HbA1C: No results for input(s): HGBA1C in the last 72 hours. CBG: No results for input(s): GLUCAP in the last 168 hours. Lipid Profile: No results for input(s): CHOL, HDL, LDLCALC, TRIG, CHOLHDL, LDLDIRECT in the last 72 hours. Thyroid Function Tests: No results for input(s): TSH, T4TOTAL, FREET4, T3FREE, THYROIDAB in the last 72 hours. Anemia Panel: No results for input(s): VITAMINB12, FOLATE, FERRITIN, TIBC, IRON, RETICCTPCT in the last 72 hours. Urine analysis:    Component Value Date/Time   COLORURINE YELLOW 06/03/2018 1601   APPEARANCEUR CLOUDY (A) 06/03/2018 1601   LABSPEC 1.014 06/03/2018 1601   PHURINE 6.0 06/03/2018 1601   GLUCOSEU NEGATIVE 06/03/2018 1601   HGBUR SMALL (A) 06/03/2018 1601   BILIRUBINUR NEGATIVE 06/03/2018 1601    KETONESUR 5 (A) 06/03/2018 1601   PROTEINUR 100 (A) 06/03/2018 1601   UROBILINOGEN 0.2 10/15/2012 1341   NITRITE NEGATIVE 06/03/2018 1601   LEUKOCYTESUR MODERATE (A) 06/03/2018 1601   Sepsis Labs: @LABRCNTIP (procalcitonin:4,lacticidven:4) ) Recent Results (from the past 240 hour(s))  Wet prep, genital     Status: Abnormal   Collection Time: 06/03/18  4:00 PM  Result Value Ref Range Status   Yeast Wet Prep HPF POC NONE SEEN NONE SEEN Final   Trich, Wet Prep NONE SEEN NONE SEEN Final   Clue Cells Wet Prep HPF POC NONE SEEN NONE SEEN Final   WBC, Wet Prep HPF POC MODERATE (A) NONE SEEN Final   Sperm NONE SEEN  Final    Comment: Performed at [facility], [place] [address] [place], [place] [address]     Radiological Exams on Admission: Dg Chest 2 View  Result Date: 06/03/2018 CLINICAL DATA:  Three day history of nausea and vomiting with fever and body ache. EXAM: CHEST - 2 VIEW COMPARISON:  01/10/2018 FINDINGS: The heart size and mediastinal contours are within normal limits. Both lungs are clear. The visualized skeletal structures are unremarkable. IMPRESSION: No active cardiopulmonary disease. Electronically Signed   By: Ashley Royalty M.D.   On: 06/03/2018 18:02   Ct Abdomen Pelvis W Contrast  Result Date: 06/03/2018 CLINICAL DATA:  Three day history of fever and body aches. Right flank pain. EXAM: CT ABDOMEN AND PELVIS WITH CONTRAST TECHNIQUE: Multidetector CT imaging of the abdomen and pelvis was performed using the standard protocol following bolus administration of intravenous contrast. CONTRAST:  138mL ISOVUE-300 IOPAMIDOL (ISOVUE-300) INJECTION 61% COMPARISON:  February 07, 2017 FINDINGS: Lower chest: Minor scar is identified in the posterior right lung base. There is a 2 mm peripheral nodule in the lateral left lung base on image 9 unchanged compared to prior exam probably a focal scar. The lung bases are otherwise clear. Hepatobiliary: No focal liver abnormality is seen. No  gallstones, gallbladder wall thickening, or biliary dilatation. Pancreas: Unremarkable. No pancreatic ductal dilatation or surrounding inflammatory changes. Spleen: Normal in size without focal abnormality. Adrenals/Urinary Tract: The adrenal glands are normal. There are small cysts in both kidneys. There is right perinephric stranding with striated decreased enhancement of the right kidney. There is thickening of the urothelium at the right renal pelvis and proximal right ureter. Small amount of right perinephric fluid is identified. There is no hydronephrosis bilaterally. The bladder is normal. Stomach/Bowel:  Stomach is within normal limits. The appendix is not definitely seen but no inflammation noted around the cecum. No evidence of bowel wall thickening, distention, or inflammatory changes. Vascular/Lymphatic: No significant vascular findings are present. No enlarged abdominal or pelvic lymph nodes. Reproductive: Uterus and bilateral adnexa are unremarkable. Other: None. Musculoskeletal: No acute or significant osseous findings. IMPRESSION: Findings are suspicious for right pyelonephritis. Clinical correlation with urinalysis is recommended. There is no hydronephrosis bilaterally. Electronically Signed   By: Abelardo Diesel M.D.   On: 06/03/2018 19:16     EKG: Not done in ED  Assessment/Plan Principal Problem:   Acute pyelonephritis Active Problems:   Microcytic anemia   IV drug user   Anxiety and depression   Sepsis ([place])   Hyponatremia   Polysubstance abuse ([place])   Tobacco abuse   Sepsis due to acute pyelonephritis: Patient meets criteria for sepsis with leukocytosis, fever, tachycardia and tachypneic.  Patient will need Rocephin for pyelonephritis.  Given history of IV drug use, will give 1 dose of vancomycin while pending blood culture.   - Admit to telemetry bed as inpt -  Ceftriaxone by IV - Give 1 dose of vancomycin  - Follow up results of urine and blood cx and amend antibiotic  regimen if needed per sensitivity results - prn Zofran for nausea - will get Procalcitonin and trend lactic acid levels per sepsis protocol. - IVF: 1L of NS and 1L of ringer solution, followed by 100 cc/h  Microcytic anemia: Hemoglobin dropped from 13.9 on 01/20/18-9.9.  No active bleeding. -Check anemia panel  IV drug user and Polysubstance abuse and tobacco abuse: Patient admitted that she used heroin yesterday. -Did counseling about importance of quitting substance. -Nicotine patch  Anxiety and depression: no SI or HI -prn ativen  GERD: -Pepcid  Hyponatremia: Na 128, mental status normal. -IV NS as above    Inpatient status:  # Patient requires inpatient status due to high intensity of service, high risk for further deterioration and high frequency of surveillance required.  I certify that at the point of admission it is my clinical judgment that the patient will require inpatient hospital care spanning beyond 2 midnights from the point of admission.  . This patient has multiple chronic comorbidities including polysubstance abuse (cocaine, methamphetamine, heroin, tobacco), GERD, depression with anxiety, anemia, HCV, endocarditis, C-spine osteomyelitis, PID . Now patient has presenting symptoms include with dysuria, fever, chills, right flank pain. . The worrisome physical exam findings include right CVA tenderness . The initial radiographic and laboratory data are worrisome because of positive urinalysis. CT of abdomen/pelvis is suggestive for pyelonephritis.  Hyponatremia, sepsis and worsening anemia. . Current medical needs: please see my assessment and plan . Predictability of an adverse outcome (risk): Patient has multiple comorbidities, now presents with sepsis due to pyelonephritis.  At high risk for deteriorating.  Patient will need to stay in hospital for at least 2 days.    DVT ppx: SQ Lovenox Code Status: Full code Family Communication: None at bed  side. Disposition Plan:  Anticipate discharge back to previous home environment Consults called:  none Admission status:  Inpatient/tele      Date of Service 06/04/2018    Ivor Costa [facility] Pager [phone]  If 7PM-7AM, please contact night-coverage [email or web address] Password [email or web address] 06/04/2018, 2:16 AM

## 2018-06-03 NOTE — ED Notes (Signed)
EMS unable to start IV and MD attempted with [place] and unable to establish IV- will consult IV team due to current IV drug use and difficult stick.

## 2018-06-03 NOTE — Progress Notes (Signed)
ED RN will call me back with report.  Earleen Reaper RN

## 2018-06-03 NOTE — Progress Notes (Signed)
Pharmacy Antibiotic Note  Sherri Francis is a 32 y.o. female admitted on 06/03/2018 with UTI.  Pharmacy has been consulted for vancomycin dosing given patient's history of IVDA. Pharmacy consulted to give one dose of vancomycin.  Plan: -Vancomycin 1 gm IV once -f/u plans for further vancomycin dosing      Temp (24hrs), Avg:101.6 F (38.7 C), Min:101.6 F (38.7 C), Max:101.6 F (38.7 C)  Recent Labs  Lab 06/03/18 1728 06/03/18 1740  WBC 26.6*  --   CREATININE 0.73  --   LATICACIDVEN  --  1.27    CrCl cannot be calculated (Unknown ideal weight.).    No Known Allergies   Thank you for allowing pharmacy to be a part of this patient's care.   Albertina Parr, PharmD., BCPS Clinical Pharmacist Clinical phone for 06/03/18 until 10:30pm: [phone] If after 10:30pm, please refer to [facility] for unit-specific pharmacist

## 2018-06-03 NOTE — ED Notes (Signed)
Report given to 5M RN. All questions answered 

## 2018-06-03 NOTE — ED Triage Notes (Addendum)
Pt arrives by [facility] for 3 day history of n/v with fever and body aches. Pt also reports right flank pain tender to touch. Ems unable to get IV access- 4mg  zofran given IM.

## 2018-06-03 NOTE — ED Provider Notes (Signed)
[facility] EMERGENCY DEPARTMENT Provider Note   CSN: [id number] Arrival date & time: 06/03/18  1551     History   Chief Complaint Chief Complaint  Patient presents with  . Fever  . Emesis    HPI Sherri Francis is a 32 y.o. female.  HPI  . Patient is a 32 year old female with a past medical history of hep C and ongoing heroin abuse who presents for evaluation of 3 days of fevers, chills, vomiting, myalgias, and right-sided flank pain.  Patient also notes she has had vaginal bleeding and discharge over the last several days as well.  Patient denies any diarrhea, cough, shortness of breath, or chest pain.  She denies any focal weakness.  Denies prior similar episodes.  Denies alleviating or aggravating factors.  Past Medical History:  Diagnosis Date  . Anemia   . Anxiety   . Depression   . Endocarditis   . Hepatitis C   . IVDU (intravenous drug user)   . Osteomyelitis of cervical spine ([place]) 09/2017  . PID (acute pelvic inflammatory disease)   . Suicide attempt [facility])    multiple attempts  . Teratoma   . UTI (urinary tract infection)     Patient Active Problem List   Diagnosis Date Noted  . Sepsis ([place]) 06/03/2018  . Acute pyelonephritis 06/03/2018  . Hyponatremia 06/03/2018  . Polysubstance abuse ([place]) 06/03/2018  . Tobacco abuse 06/03/2018  . Hypokalemia 10/02/2017  . Bacteremia 10/02/2017  . Anxiety and depression   . Osteomyelitis of cervical spine ([place]) 09/22/2017  . IV drug user   . Acute osteomyelitis of cervical spine ([place]) 09/20/2017  . Opioid abuse with opioid-induced mood disorder ([place]) 02/21/2017  . Fever 02/20/2017  . Microcytic anemia 02/20/2017  . Acute bacterial endocarditis   . Substance induced mood disorder ([place]) 01/30/2017  . Opioid type dependence, continuous ([place]) 01/30/2017  . 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 Bacteremia due to  Staphylococcus aureus 01/27/2017  . Pneumonia 01/24/2017  . Pelvic abscess in female 01/13/2016  . PID (acute pelvic inflammatory disease) 01/13/2016    Past Surgical History:  Procedure Laterality Date  . LAPAROSCOPY    . TEE WITHOUT CARDIOVERSION N/A 09/27/2017   Procedure: TRANSESOPHAGEAL ECHOCARDIOGRAM (TEE);  Surgeon: Pixie Casino, MD;  Location[facility] ENDOSCOPY;  Service: Cardiovascular;  Laterality: N/A;     OB History    Gravida  0   Para  0   Term  0   Preterm  0   AB  0   Living  0     SAB  0   TAB  0   Ectopic  0   Multiple  0   Live Births  0            Home Medications    Prior to Admission medications   Medication Sig Start Date End Date Taking? Authorizing Provider  acetaminophen (TYLENOL) 500 MG tablet Take 1,000 mg by mouth every 6 (six) hours as needed for mild pain.    [provider]  cephALEXin (KEFLEX) 500 MG capsule Take 2 capsules (1,000 mg total) by mouth 2 (two) times daily. Patient not taking: Reported on 01/20/2018 01/10/18   Charlesetta Shanks, MD  doxycycline (VIBRAMYCIN) 100 MG capsule Take 1 capsule (100 mg total) by mouth 2 (two) times daily. One po bid x 7 days Patient not taking: Reported on 01/20/2018 01/10/18   Charlesetta Shanks, MD  famotidine (PEPCID) 20 MG tablet Take 1 tablet (20 mg total) by mouth 2 (two) times daily as needed for heartburn or indigestion. 01/10/18   Charlesetta Shanks, MD  ibuprofen (ADVIL,MOTRIN) 600 MG tablet Take 1 tablet (600 mg total) by mouth every 6 (six) hours as needed. Patient taking differently: Take 600 mg by mouth every 6 (six) hours as needed for moderate pain.  01/10/18   Charlesetta Shanks, MD  loperamide (IMODIUM A-D) 2 MG tablet Take 2 mg by mouth daily as needed for diarrhea or loose stools.    [provider]  meclizine (ANTIVERT) 12.5 MG tablet Take 12.5 mg by mouth daily as needed for dizziness.    [provider]  ondansetron (ZOFRAN ODT) 4 MG disintegrating tablet Take 1  tablet (4 mg total) by mouth every 4 (four) hours as needed for nausea or vomiting. 01/10/18   Charlesetta Shanks, MD    Family History Family History  Problem Relation Age of Onset  . Heart disease Maternal Grandfather   . Anxiety disorder Maternal Grandfather   . Heart disease Maternal Aunt   . Alcohol abuse Father   . Drug abuse Father   . Cancer Neg Hx     Social History Social History   Tobacco Use  . Smoking status: Current Every Day Smoker    Packs/day: 0.50    Types: Cigarettes  . Smokeless tobacco: Never Used  Substance Use Topics  . Alcohol use: Yes    Comment: Recovering, 3 years ago   . Drug use: Yes    Types: IV, Cocaine, Methamphetamines    Comment: Heronin: 2 days ago, Methamphetamines: 2 days ago, Deneis cocaine and marijuaina use      Allergies   Patient has no known allergies.   Review of Systems Review of Systems  Constitutional: Positive for chills and fever.  HENT: Negative for ear pain and sore throat.   Eyes: Negative for pain and visual disturbance.  Respiratory: Positive for cough. Negative for shortness of breath.   Cardiovascular: Negative for chest pain and palpitations.  Gastrointestinal: Negative for abdominal pain and vomiting.  Genitourinary: Positive for flank pain, vaginal bleeding and vaginal discharge. Negative for dysuria and hematuria.  Musculoskeletal: Positive for myalgias ( diffuse). Negative for arthralgias and back pain.  Skin: Positive for rash ( L neck and L upper chest). Negative for color change.  Neurological: Negative for seizures and syncope.  All other systems reviewed and are negative.    Physical Exam Updated Vital Signs BP 121/71   Pulse (!) 114   Temp (!) 101.6 F (38.7 C) (Oral)   Resp 20   SpO2 98%   Physical Exam Vitals signs and nursing note reviewed. Exam conducted with a chaperone present.  Constitutional:      General: She is not in acute distress.    Appearance: Normal appearance. She is  well-developed and normal weight.  HENT:     Head: Normocephalic and atraumatic.     Right Ear: External ear normal.     Left Ear: External ear normal.     Nose: Nose normal.     Mouth/Throat:     Mouth: Mucous membranes are moist.  Eyes:     Conjunctiva/sclera: Conjunctivae normal.  Neck:     Musculoskeletal: Neck supple.  Cardiovascular:     Rate and Rhythm: Regular rhythm. Tachycardia present.     Heart sounds: No murmur.  Pulmonary:     Effort: Pulmonary effort is normal. No respiratory distress.  Breath sounds: Normal breath sounds.  Abdominal:     Palpations: Abdomen is soft.     Tenderness: There is abdominal tenderness in the right upper quadrant. There is right CVA tenderness.  Genitourinary:    Cervix: No cervical motion tenderness, discharge, friability, lesion, erythema or cervical bleeding.  Skin:    General: Skin is warm and dry.     Capillary Refill: Capillary refill takes 2 to 3 seconds.     Findings: Rash present. Rash is macular.     Comments: Patient has a blanching erythematous rash over her left neck and left chest.  She is also noted to have multiple scabs over all extremities.  Neurological:     General: No focal deficit present.     Mental Status: She is alert.       ED Treatments / Results  Labs (all labs ordered are listed, but only abnormal results are displayed) Labs Reviewed  WET PREP, GENITAL - Abnormal; Notable for the following components:      Result Value   WBC, Wet Prep HPF POC MODERATE (*)    All other components within normal limits  CBC WITH DIFFERENTIAL/PLATELET - Abnormal; Notable for the following components:   WBC 26.6 (*)    Hemoglobin 9.9 (*)    HCT 31.1 (*)    MCV 77.0 (*)    MCH 24.5 (*)    Neutro Abs 23.1 (*)    Monocytes Absolute 2.3 (*)    Abs Immature Granulocytes 0.24 (*)    All other components within normal limits  COMPREHENSIVE METABOLIC PANEL - Abnormal; Notable for the following components:   Sodium 128  (*)    Chloride 93 (*)    Glucose, Bld 141 (*)    Calcium 8.3 (*)    Albumin 3.1 (*)    All other components within normal limits  URINALYSIS, ROUTINE W REFLEX MICROSCOPIC - Abnormal; Notable for the following components:   APPearance CLOUDY (*)    Hgb urine dipstick SMALL (*)    Ketones, ur 5 (*)    Protein, ur 100 (*)    Leukocytes, UA MODERATE (*)    Bacteria, UA MANY (*)    All other components within normal limits  CULTURE, BLOOD (ROUTINE X 2)  CULTURE, BLOOD (ROUTINE X 2)  URINE CULTURE  LIPASE, BLOOD  PREGNANCY, URINE  MAGNESIUM  RAPID HIV SCREEN (HIV 1/2 AB+AG)  RPR  INFLUENZA PANEL BY PCR (TYPE A & B)  VITAMIN B12  FOLATE  IRON AND TIBC  FERRITIN  RETICULOCYTES  BASIC METABOLIC PANEL  CBC  TSH  LACTIC ACID, PLASMA  PROCALCITONIN  I-STAT CG4 LACTIC ACID, ED  I-STAT CG4 LACTIC ACID, ED  GC/CHLAMYDIA PROBE AMP ([place]) NOT AT [facility]    EKG None  Radiology Dg Chest 2 View  Result Date: 06/03/2018 CLINICAL DATA:  Three day history of nausea and vomiting with fever and body ache. EXAM: CHEST - 2 VIEW COMPARISON:  01/10/2018 FINDINGS: The heart size and mediastinal contours are within normal limits. Both lungs are clear. The visualized skeletal structures are unremarkable. IMPRESSION: No active cardiopulmonary disease. Electronically Signed   By: Ashley Royalty M.D.   On: 06/03/2018 18:02   Ct Abdomen Pelvis W Contrast  Result Date: 06/03/2018 CLINICAL DATA:  Three day history of fever and body aches. Right flank pain. EXAM: CT ABDOMEN AND PELVIS WITH CONTRAST TECHNIQUE: Multidetector CT imaging of the abdomen and pelvis was performed using the standard protocol following bolus administration of intravenous  contrast. CONTRAST:  131mL ISOVUE-300 IOPAMIDOL (ISOVUE-300) INJECTION 61% COMPARISON:  February 07, 2017 FINDINGS: Lower chest: Minor scar is identified in the posterior right lung base. There is a 2 mm peripheral nodule in the lateral left lung base on image 9  unchanged compared to prior exam probably a focal scar. The lung bases are otherwise clear. Hepatobiliary: No focal liver abnormality is seen. No gallstones, gallbladder wall thickening, or biliary dilatation. Pancreas: Unremarkable. No pancreatic ductal dilatation or surrounding inflammatory changes. Spleen: Normal in size without focal abnormality. Adrenals/Urinary Tract: The adrenal glands are normal. There are small cysts in both kidneys. There is right perinephric stranding with striated decreased enhancement of the right kidney. There is thickening of the urothelium at the right renal pelvis and proximal right ureter. Small amount of right perinephric fluid is identified. There is no hydronephrosis bilaterally. The bladder is normal. Stomach/Bowel: Stomach is within normal limits. The appendix is not definitely seen but no inflammation noted around the cecum. No evidence of bowel wall thickening, distention, or inflammatory changes. Vascular/Lymphatic: No significant vascular findings are present. No enlarged abdominal or pelvic lymph nodes. Reproductive: Uterus and bilateral adnexa are unremarkable. Other: None. Musculoskeletal: No acute or significant osseous findings. IMPRESSION: Findings are suspicious for right pyelonephritis. Clinical correlation with urinalysis is recommended. There is no hydronephrosis bilaterally. Electronically Signed   By: Abelardo Diesel M.D.   On: 06/03/2018 19:16    Procedures Procedures (including critical care time)  Medications Ordered in ED Medications  cefTRIAXone (ROCEPHIN) 1 g in sodium chloride 0.9 % 100 mL IVPB (1 g Intravenous New Bag/Given 06/03/18 2017)  famotidine (PEPCID) tablet 20 mg (has no administration in time range)  loperamide (IMODIUM A-D) tablet 2 mg (has no administration in time range)  sodium chloride 0.9 % bolus 1,000 mL (has no administration in time range)  0.9 %  sodium chloride infusion (has no administration in time range)  cefTRIAXone  (ROCEPHIN) 1 g in sodium chloride 0.9 % 100 mL IVPB (has no administration in time range)  LORazepam (ATIVAN) injection 0.5 mg (has no administration in time range)  ibuprofen (ADVIL,MOTRIN) tablet 400 mg (has no administration in time range)  ketorolac (TORADOL) 15 MG/ML injection 15 mg (has no administration in time range)  enoxaparin (LOVENOX) injection 40 mg (has no administration in time range)  acetaminophen (TYLENOL) tablet 650 mg (has no administration in time range)    Or  acetaminophen (TYLENOL) suppository 650 mg (has no administration in time range)  ondansetron (ZOFRAN) tablet 4 mg (has no administration in time range)    Or  ondansetron (ZOFRAN) injection 4 mg (has no administration in time range)  zolpidem (AMBIEN) tablet 5 mg (has no administration in time range)  nicotine (NICODERM CQ - dosed in mg/24 hours) patch 21 mg (has no administration in time range)  vancomycin (VANCOCIN) IVPB 1000 mg/200 mL premix (has no administration in time range)  lactated ringers bolus 1,000 mL (1,000 mLs Intravenous New Bag/Given 06/03/18 1740)  ondansetron (ZOFRAN) injection 4 mg (4 mg Intravenous Given 06/03/18 1734)  iopamidol (ISOVUE-300) 61 % injection 100 mL (100 mLs Intravenous Contrast Given 06/03/18 1840)     Initial Impression / Assessment and Plan / ED Course  I have reviewed the triage vital signs and the nursing notes.  Pertinent labs & imaging results that were available during my care of the patient were reviewed by me and considered in my medical decision making (see chart for details).     Patient is a  32 year old female who presents above-stated history exam.  On presentation patient is tachycardic to 116 with temperature one 1.6 otherwise stable vital signs.  Exam as above remarkable tachycardia, delayed capillary refill, abdominal right upper quadrant tenderness as well as right flank and CVA tenderness in the above described rash.  Regarding the patient's cough I have  low suspicion for pneumonia given chest x-ray shows no focal consolidations, pleural effusions, or other acute intrathoracic abnormalities.  Regarding the patient's right flank pain CT is consistent with pyelonephritis.  No findings to suggest appendicitis, ovarian torsion, kidney stone, or other acute intra-abdominal etiology.  UA is consistent with infected urine.  CBC remarkable for lymphocytosis and otherwise WNL.  Pelvic exam is not consistent with PID or T OA.  Wet prep is only remarkable for WBCs.  Gonorrhea and Chlamydia studies were sent.  HIV studies were sent.  Doubt acute pancreatitis given lipase WNL.  Urine pregnancy test is negative.  Patient given IV fluids, Tylenol, Zofran, and IV antibiotics without nephritis.  Admitted to hospital service in stable condition.  Final Clinical Impressions(s) / ED Diagnoses   Final diagnoses:  Pyelonephritis  Fever, unspecified fever cause  Tachycardia  Lymphocytosis    ED Discharge Orders    None       Hulan Saas, MD 06/03/18 2031    Tegeler, Gwenyth Allegra, MD 06/04/18 [phone]

## 2018-06-04 LAB — BASIC METABOLIC PANEL
Anion gap: 11 (ref 5–15)
BUN: 12 mg/dL (ref 6–20)
CO2: 20 mmol/L — ABNORMAL LOW (ref 22–32)
Calcium: 7.9 mg/dL — ABNORMAL LOW (ref 8.9–10.3)
Chloride: 102 mmol/L (ref 98–111)
Creatinine, Ser: 0.65 mg/dL (ref 0.44–1.00)
GFR calc Af Amer: >60 mL/min (ref >60)
GFR calc non Af Amer: >60 mL/min (ref >60)
Glucose, Bld: 126 mg/dL — ABNORMAL HIGH (ref 70–99)
Potassium: 3.9 mmol/L (ref 3.5–5.1)
SODIUM: 133 mmol/L — AB (ref 135–145)

## 2018-06-04 LAB — VITAMIN B12: VITAMIN B 12: 215 pg/mL (ref 180–914)

## 2018-06-04 LAB — TSH: TSH: 0.798 u[IU]/mL (ref 0.350–4.500)

## 2018-06-04 LAB — IRON AND TIBC
Iron: 6 ug/dL — ABNORMAL LOW (ref 28–170)
Saturation Ratios: 3 % — ABNORMAL LOW (ref 10.4–31.8)
TIBC: 206 ug/dL — AB (ref 250–450)
UIBC: 200 ug/dL

## 2018-06-04 LAB — RPR: RPR Ser Ql: NONREACTIVE

## 2018-06-04 LAB — CBC
HCT: 28.2 % — ABNORMAL LOW (ref 36.0–46.0)
Hemoglobin: 8.9 g/dL — ABNORMAL LOW (ref 12.0–15.0)
MCH: 24.2 pg — ABNORMAL LOW (ref 26.0–34.0)
MCHC: 31.6 g/dL (ref 30.0–36.0)
MCV: 76.6 fL — ABNORMAL LOW (ref 80.0–100.0)
Platelets: 257 10*3/uL (ref 150–400)
RBC: 3.68 MIL/uL — ABNORMAL LOW (ref 3.87–5.11)
RDW: 14.5 % (ref 11.5–15.5)
WBC: 20.8 10*3/uL — ABNORMAL HIGH (ref 4.0–10.5)
nRBC: 0 % (ref 0.0–0.2)

## 2018-06-04 LAB — PROCALCITONIN: Procalcitonin: 2.99 ng/mL

## 2018-06-04 LAB — LACTIC ACID, PLASMA: LACTIC ACID, VENOUS: 0.8 mmol/L (ref 0.5–1.9)

## 2018-06-04 LAB — FOLATE: Folate: 10.4 ng/mL (ref >5.9)

## 2018-06-04 LAB — RETICULOCYTES
Immature Retic Fract: 11.3 % (ref 2.3–15.9)
RBC.: 3.62 MIL/uL — ABNORMAL LOW (ref 3.87–5.11)
Retic Count, Absolute: 27.5 10*3/uL (ref 19.0–186.0)
Retic Ct Pct: 0.8 % (ref 0.4–3.1)

## 2018-06-04 LAB — FERRITIN: Ferritin: 94 ng/mL (ref 11–307)

## 2018-06-04 LAB — HCG, QUANTITATIVE, PREGNANCY: hCG, Beta Chain, Quant, S: <1 m[IU]/mL (ref <5)

## 2018-06-04 MED ORDER — SODIUM CHLORIDE 0.9 % IV BOLUS
500.0000 mL | Freq: Once | INTRAVENOUS | Status: AC
  Administered 2018-06-04: 500 mL via INTRAVENOUS

## 2018-06-04 MED ORDER — LORAZEPAM 0.5 MG PO TABS
0.5000 mg | ORAL_TABLET | Freq: Four times a day (QID) | ORAL | Status: DC | PRN
  Administered 2018-06-04: 15:00:00 via ORAL
  Filled 2018-06-04: qty 1

## 2018-06-04 MED ORDER — CEFTRIAXONE SODIUM 1 G IJ SOLR
1.0000 g | INTRAMUSCULAR | Status: DC
  Filled 2018-06-04: qty 10

## 2018-06-04 NOTE — Progress Notes (Signed)
PROGRESS NOTE    Sherri Francis  VWU:[id number] DOB: 1985/09/22 DOA: 06/03/2018 PCP: Patient, No Pcp Per   Brief Narrative: Sherri Francis is a 32 y.o. female with medical history significant of polysubstance abuse (cocaine, methamphetamine, heroin, tobacco), GERD, depression with anxiety, anemia, HCV, endocarditis. She presented with fever, dysuria, flank pain with evidence of pyelonephritis. Started on ceftriaxone.   Assessment & Plan:   Principal Problem:   Acute pyelonephritis Active Problems:   Microcytic anemia   IV drug user   Anxiety and depression   Sepsis ([place])   Hyponatremia   Polysubstance abuse ([place])   Tobacco abuse   Acute pyelonephritis Evidence of right sided pyelonephritis on CT scan. Urinalysis suggests UTI. Started empirically on ceftriaxone. Given a dose of vancomycin secondary to history of IV drug use and previous MSSA bacteremia. -Continue Ceftriaxone -Blood cultures pending  Leukocytosis In setting of acute infection. Improved from yesterday -CBC in AM  IV drug use Polysubstance abuse -Ativan prn -Zofran prn  Tobacco abuse -Continue nicotine patch  Microcytic anemia Evidence of iron deficiency anemia. Menstruating female. -Iron supplementation after treatment of acute infection  DVT prophylaxis: Lovenox Code Status:   Code Status: Full Code Family Communication: None at bedside Disposition Plan: Discharge pending culture results   Consultants:   None  Procedures:   None  Antimicrobials:  Vancomycin (12/27)  Ceftriaxone (12/27>>   Subjective: Right flank pain  Objective: Vitals:   06/03/18 1715 06/03/18 2226 06/03/18 2244 06/04/18 0048  BP: 121/71 (!) 142/97 (!) 138/92 125/69  Pulse: (!) 114 (!) 125 (!) 118 (!) 132  Resp:  20 18 18   Temp:  (!) 100.7 F (38.2 C) 100.2 F (37.9 C) (!) 100.8 F (38.2 C)  TempSrc:  Oral Axillary Oral  SpO2: 98% 97% 99% 96%    Intake/Output Summary (Last 24 hours) at 06/04/2018  1322 Last data filed at 06/04/2018 1120 Gross per 24 hour  Intake 2040 ml  Output 200 ml  Net 1840 ml   There were no vitals filed for this visit.  Examination:  General exam: Appears calm and comfortable Respiratory system: Clear to auscultation. Respiratory effort normal. Cardiovascular system: S1 & S2 heard, RRR. No murmurs, rubs, gallops or clicks. Gastrointestinal system: Abdomen is nondistended, soft. Right flank tenderness. No organomegaly or masses felt. Normal bowel sounds heard. Central nervous system: Alert and oriented. No focal neurological deficits. Extremities: No edema. No calf tenderness Skin: No cyanosis. No rashes Psychiatry: Judgement and insight appear normal. Mood & affect appropriate.     Data Reviewed: I have personally reviewed following labs and imaging studies  CBC: Recent Labs  Lab 06/03/18 1728 06/04/18 0710  WBC 26.6* 20.8*  NEUTROABS 23.1*  --   HGB 9.9* 8.9*  HCT 31.1* 28.2*  MCV 77.0* 76.6*  PLT 313 295   Basic Metabolic Panel: Recent Labs  Lab 06/03/18 1728 06/04/18 0710  NA 128* 133*  K 3.8 3.9  CL 93* 102  CO2 23 20*  GLUCOSE 141* 126*  BUN 12 12  CREATININE 0.73 0.65  CALCIUM 8.3* 7.9*  MG 1.9  --    GFR: CrCl cannot be calculated (Unknown ideal weight.). Liver Function Tests: Recent Labs  Lab 06/03/18 1728  AST 19  ALT 32  ALKPHOS 74  BILITOT 0.9  PROT 7.0  ALBUMIN 3.1*   Recent Labs  Lab 06/03/18 1728  LIPASE 14   No results for input(s): AMMONIA in the last 168 hours. Coagulation Profile: No results for input(s): INR, PROTIME in  the last 168 hours. Cardiac Enzymes: No results for input(s): CKTOTAL, CKMB, CKMBINDEX, TROPONINI in the last 168 hours. BNP (last 3 results) No results for input(s): PROBNP in the last 8760 hours. HbA1C: No results for input(s): HGBA1C in the last 72 hours. CBG: No results for input(s): GLUCAP in the last 168 hours. Lipid Profile: No results for input(s): CHOL, HDL,  LDLCALC, TRIG, CHOLHDL, LDLDIRECT in the last 72 hours. Thyroid Function Tests: Recent Labs    06/04/18 0710  TSH 0.798   Anemia Panel: Recent Labs    06/04/18 0710  VITAMINB12 215  FOLATE 10.4  FERRITIN 94  TIBC 206*  IRON 6*  RETICCTPCT 0.8   Sepsis Labs: Recent Labs  Lab 06/03/18 1740 06/04/18 0710  PROCALCITON  --  2.99  LATICACIDVEN 1.27 0.8    Recent Results (from the past 240 hour(s))  Wet prep, genital     Status: Abnormal   Collection Time: 06/03/18  4:00 PM  Result Value Ref Range Status   Yeast Wet Prep HPF POC NONE SEEN NONE SEEN Final   Trich, Wet Prep NONE SEEN NONE SEEN Final   Clue Cells Wet Prep HPF POC NONE SEEN NONE SEEN Final   WBC, Wet Prep HPF POC MODERATE (A) NONE SEEN Final   Sperm NONE SEEN  Final    Comment: Performed at [facility], [place] [address], [place], [place] [address]  Blood culture (routine x 2)     Status: None (Preliminary result)   Collection Time: 06/03/18  5:28 PM  Result Value Ref Range Status   Specimen Description BLOOD SITE NOT SPECIFIED  Final   Special Requests   Final    BOTTLES DRAWN AEROBIC AND ANAEROBIC Blood Culture adequate volume   Culture   Final    NO GROWTH < 24 HOURS Performed at [facility], [place] [address] [place], [place] [address]    Report Status PENDING  Incomplete         Radiology Studies: Dg Chest 2 View  Result Date: 06/03/2018 CLINICAL DATA:  Three day history of nausea and vomiting with fever and body ache. EXAM: CHEST - 2 VIEW COMPARISON:  01/10/2018 FINDINGS: The heart size and mediastinal contours are within normal limits. Both lungs are clear. The visualized skeletal structures are unremarkable. IMPRESSION: No active cardiopulmonary disease. Electronically Signed   By: Ashley Royalty M.D.   On: 06/03/2018 18:02   Ct Abdomen Pelvis W Contrast  Result Date: 06/03/2018 CLINICAL DATA:  Three day history of fever and body aches. Right flank pain. EXAM: CT ABDOMEN AND PELVIS  WITH CONTRAST TECHNIQUE: Multidetector CT imaging of the abdomen and pelvis was performed using the standard protocol following bolus administration of intravenous contrast. CONTRAST:  167mL ISOVUE-300 IOPAMIDOL (ISOVUE-300) INJECTION 61% COMPARISON:  February 07, 2017 FINDINGS: Lower chest: Minor scar is identified in the posterior right lung base. There is a 2 mm peripheral nodule in the lateral left lung base on image 9 unchanged compared to prior exam probably a focal scar. The lung bases are otherwise clear. Hepatobiliary: No focal liver abnormality is seen. No gallstones, gallbladder wall thickening, or biliary dilatation. Pancreas: Unremarkable. No pancreatic ductal dilatation or surrounding inflammatory changes. Spleen: Normal in size without focal abnormality. Adrenals/Urinary Tract: The adrenal glands are normal. There are small cysts in both kidneys. There is right perinephric stranding with striated decreased enhancement of the right kidney. There is thickening of the urothelium at the right renal pelvis and proximal right ureter.  Small amount of right perinephric fluid is identified. There is no hydronephrosis bilaterally. The bladder is normal. Stomach/Bowel: Stomach is within normal limits. The appendix is not definitely seen but no inflammation noted around the cecum. No evidence of bowel wall thickening, distention, or inflammatory changes. Vascular/Lymphatic: No significant vascular findings are present. No enlarged abdominal or pelvic lymph nodes. Reproductive: Uterus and bilateral adnexa are unremarkable. Other: None. Musculoskeletal: No acute or significant osseous findings. IMPRESSION: Findings are suspicious for right pyelonephritis. Clinical correlation with urinalysis is recommended. There is no hydronephrosis bilaterally. Electronically Signed   By: Abelardo Diesel M.D.   On: 06/03/2018 19:16        Scheduled Meds: . enoxaparin (LOVENOX) injection  40 mg Subcutaneous QHS  . nicotine   21 mg Transdermal Daily   Continuous Infusions: . sodium chloride 100 mL/hr at 06/04/18 0603  . cefTRIAXone (ROCEPHIN)  IV       LOS: 1 day     Cordelia Poche, MD [facility] 06/04/2018, 1:22 PM  If 7PM-7AM, please contact night-coverage [email or web address]

## 2018-06-05 LAB — URINE CULTURE: Culture: >=100000 — AB

## 2018-06-06 LAB — GC/CHLAMYDIA PROBE AMP (~~LOC~~) NOT AT ARMC
CHLAMYDIA, DNA PROBE: NEGATIVE
Neisseria Gonorrhea: NEGATIVE

## 2018-06-08 LAB — CULTURE, BLOOD (ROUTINE X 2)
Culture: NO GROWTH
Special Requests: ADEQUATE

## 2018-06-08 NOTE — Discharge Summary (Signed)
Brief Discharge Summary  HIPI written by Ivor Costa, MD  HPI: Sherri Francis is a 33 y.o. female with medical history significant of polysubstance abuse (cocaine, methamphetamine, heroin, tobacco), GERD, depression with anxiety, anemia, HCV, endocarditis, C-spine osteomyelitis, PID, who presents with dysuria, fever, chills, right flank pain.  Patient states that she has been having fever, chills, right flank pain in the past 3 days. She also has dysuria, burning on urination, increased urinary frequency.  She has body aches and generalized weakness.  Denies cough, chest pain, shortness of breath. Pt has nausea and vomited several times, but no diarrhea or abdominal pain, symptoms of UTI or unilateral weakness.  Patient states that her previous endocarditis and C-spine osteomyelitis had resolved.  Currently patient does not have any neck pain.  No unilateral weakness.  Patient states that she used heroin yesterday.  ED Course: pt was found to have positive urinalysis (cloudy appearance, moderate amount of leukocyte, many bacteria, WBC 21-50), lipase 14, WBC 26.6, lactic acid 1.27, sodium 128, creatinine and BUN normal, temperature well 1.6, tachycardia, tachypnea, oxygen saturation 98% on room air.  Chest x-ray is negative.  CT abdomen/pelvis is suggestive for pyelonephritis.  Patient is admitted to telemetry bed as inpatient.   Brief course: Patient treated for acute pyelonephritis of her right kidney with ceftriaxone. Blood cultures obtained and were pending. Patient apparently left the floor and presumably out the hospital. If patient returns, will need to return through the ED.  Cordelia Poche, MD [facility] 06/08/2018, 7:39 AM

## 2019-06-01 IMAGING — CR DG CHEST 2V
2 series · 2 of 2 positions shown · non-contrast
Comparison: None.

CLINICAL DATA: Fever

EXAM:
CHEST  2 VIEW

[w chest pa]
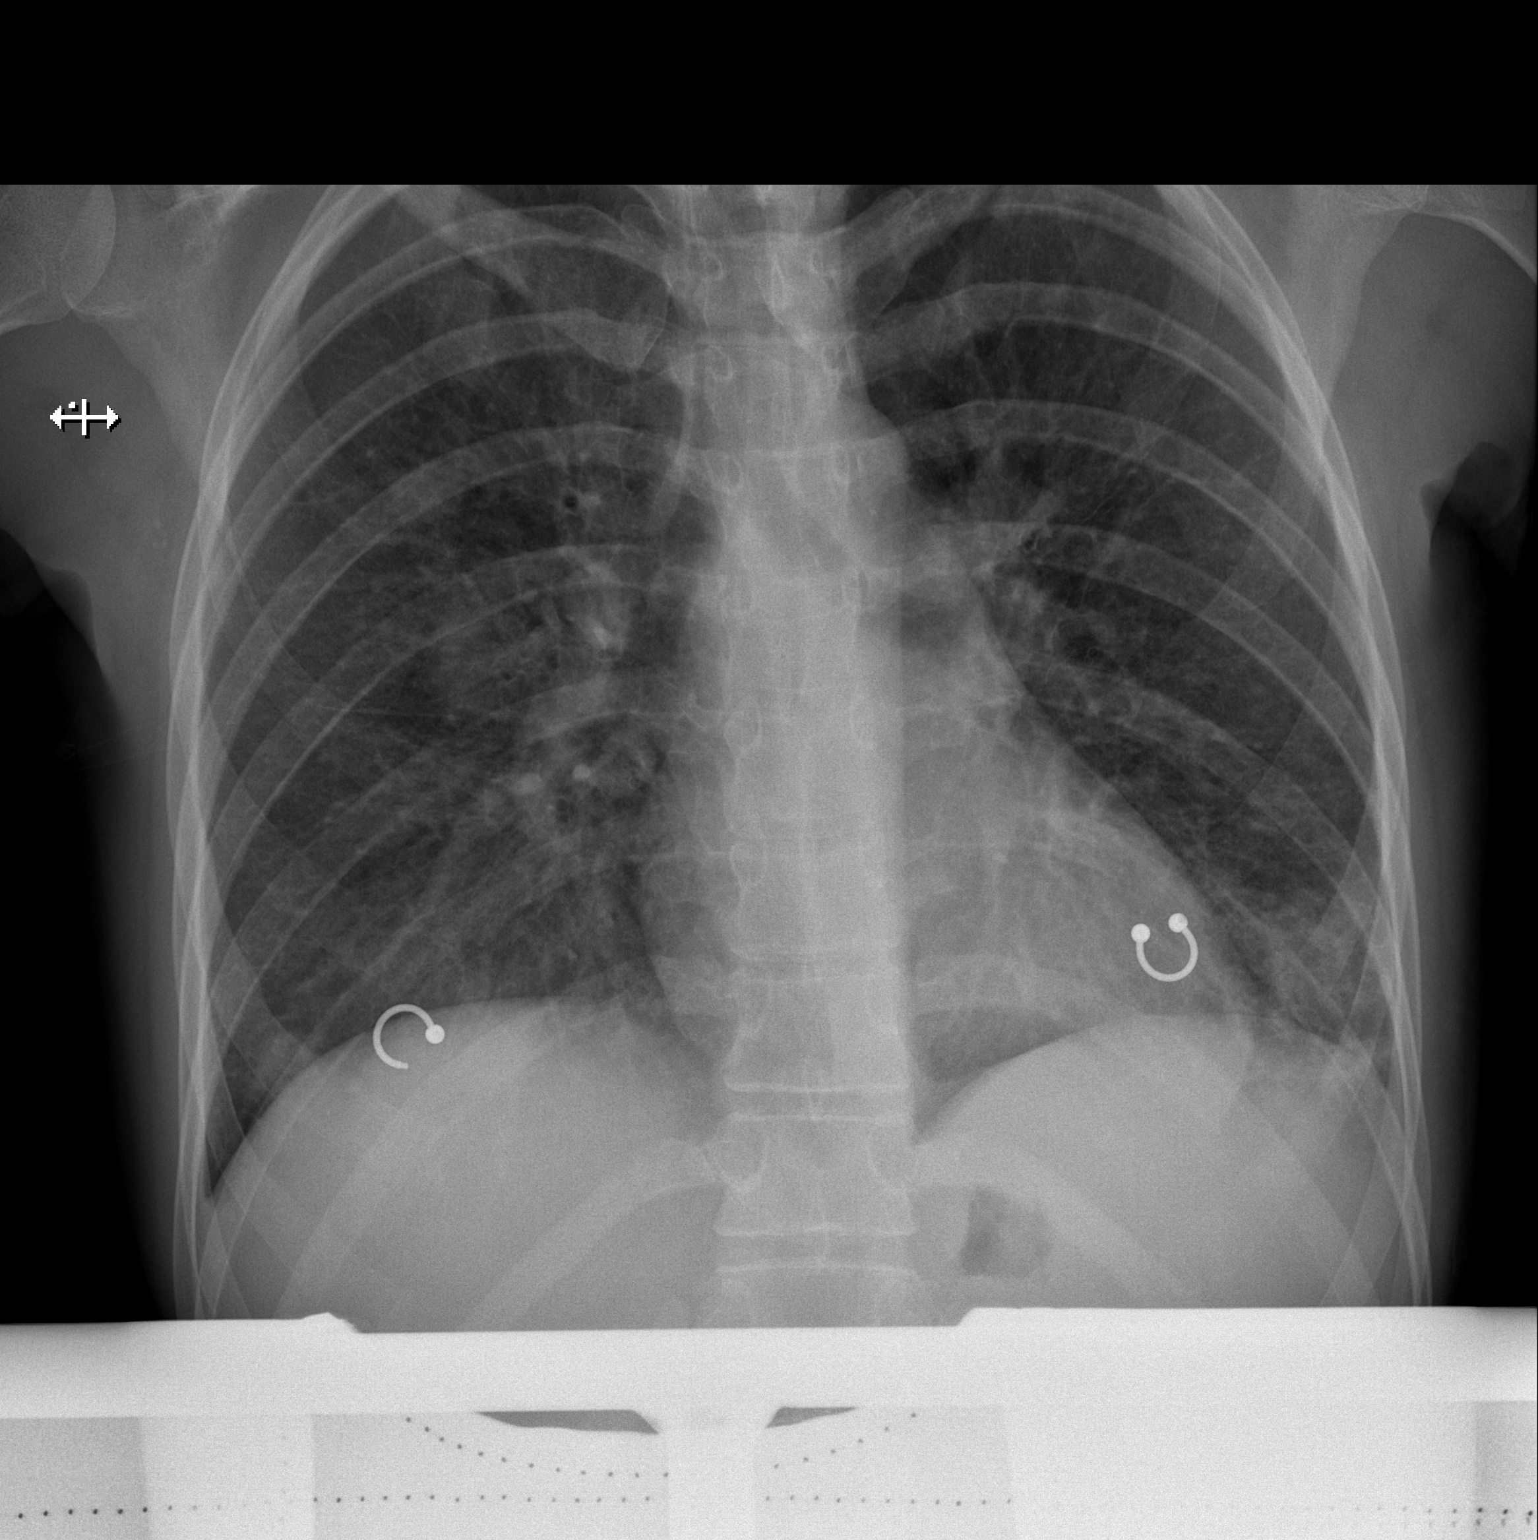

[w chest lat]
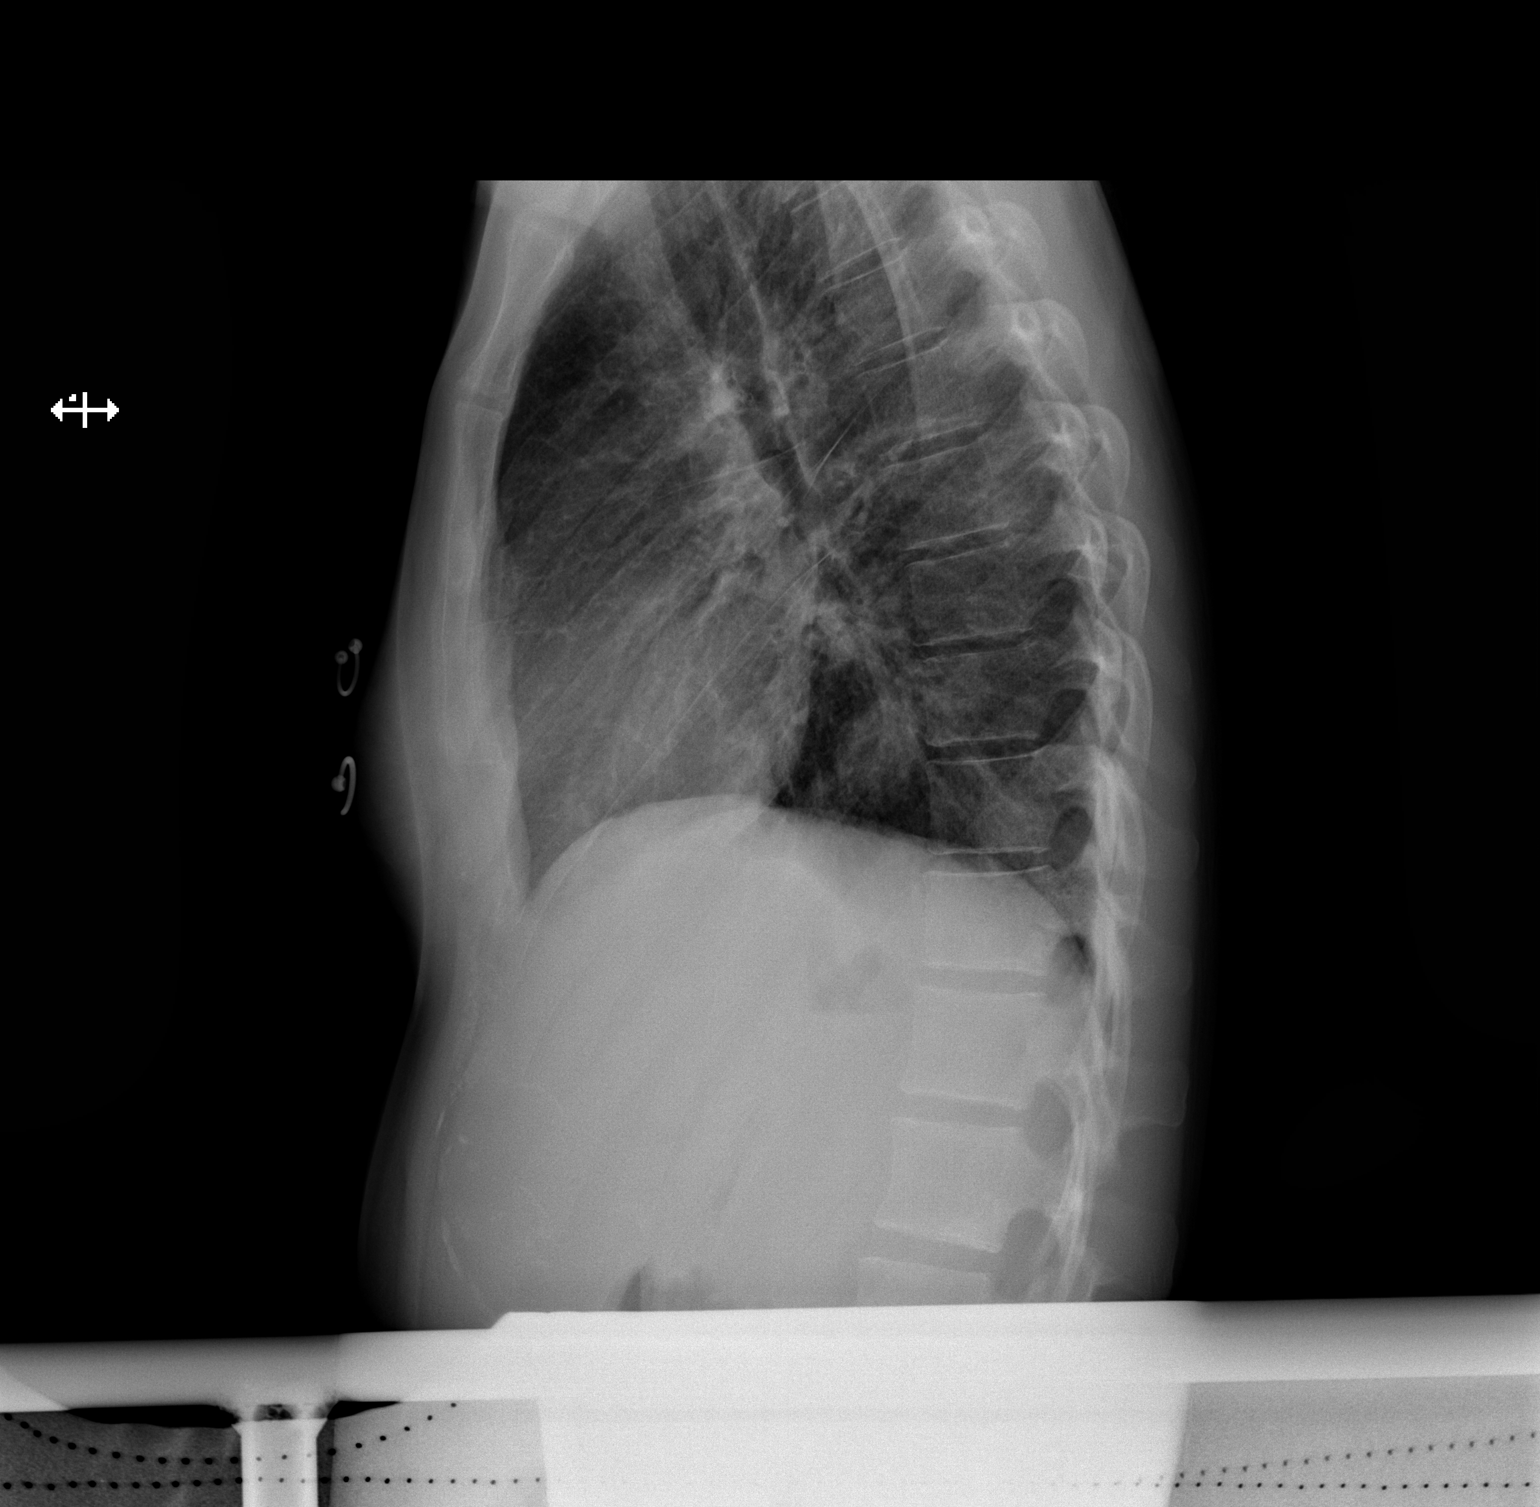

[2 of 2 positions shown; findings below may reference images not displayed]

FINDINGS: Patchy opacity at the left base and right perihilar/ basal lung. No
edema, effusion, or pneumothorax. Normal heart size. No acute
osseous finding.
IMPRESSION: Bilateral pneumonia.

## 2019-06-04 IMAGING — CT CT CHEST W/ CM
2 of 6 series · 15 of 36 positions shown, 19 images · IV contrast (iopamidol)
Comparison: Chest radiograph 01/24/2017

CLINICAL DATA: Cough and fever. Bacteremia. IV drug abuse. Abnormal
chest x-ray.

EXAM:
CT CHEST WITH CONTRAST
TECHNIQUE: Multidetector CT imaging of the chest was performed during
intravenous contrast administration.
CONTRAST:  75mL [id number] IOPAMIDOL ([id number]) INJECTION 61%

[Series 2: axial st · axial · 0.52mm/px · z∈[-274,-22]mm · 14 of 140 slices shown, 18 images]
[im 7/140  mediastinal]
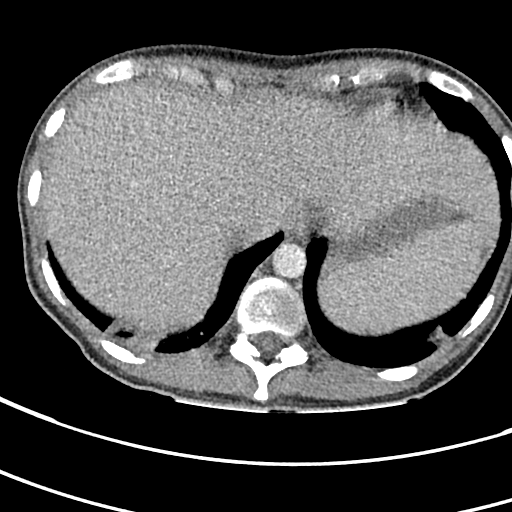
[im 7/140  lung]
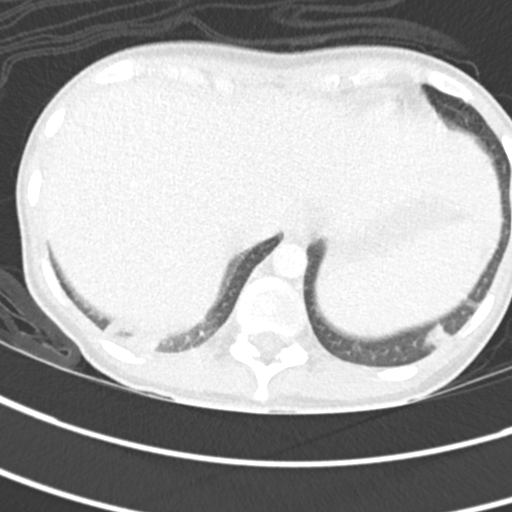
[im 20/140  lung]
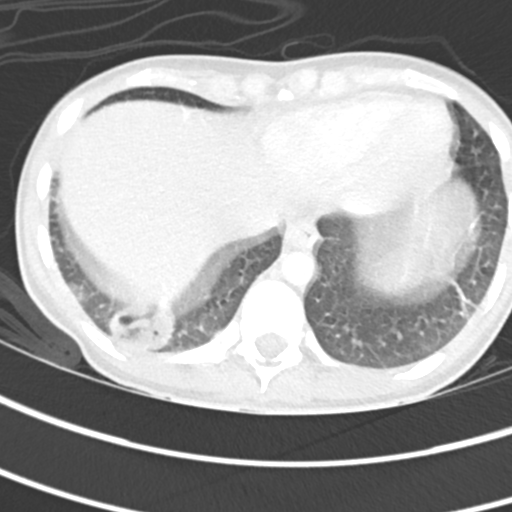
[im 27/140  lung]
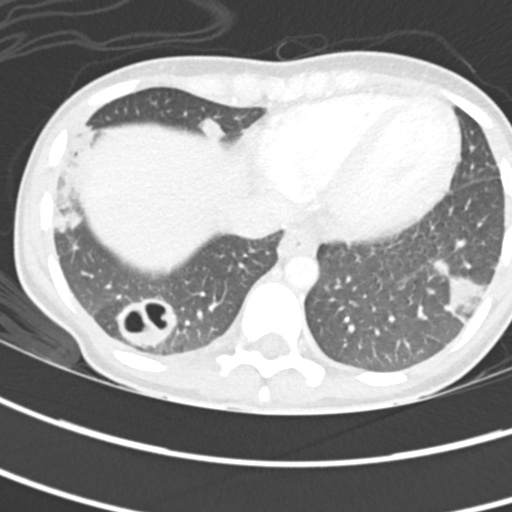
[im 40/140  lung]
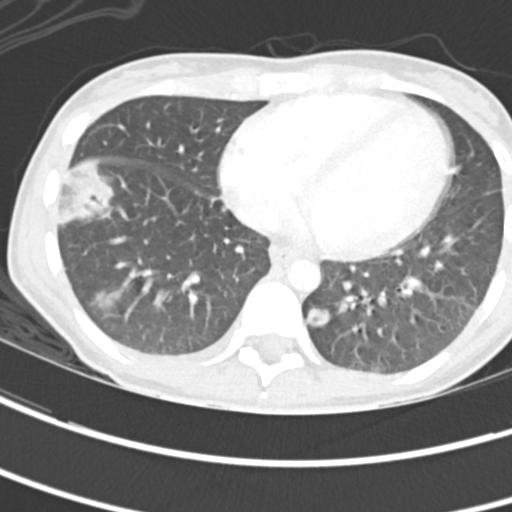
[im 47/140  mediastinal]
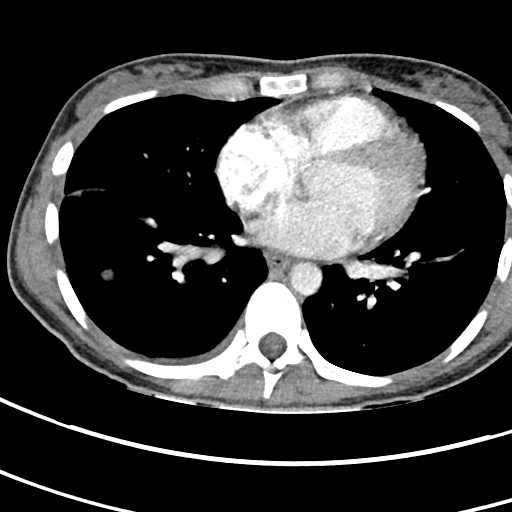
[im 47/140  lung]
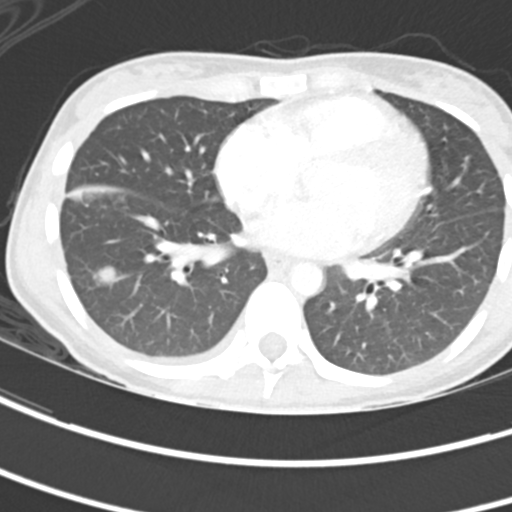
[im 53/140  lung]
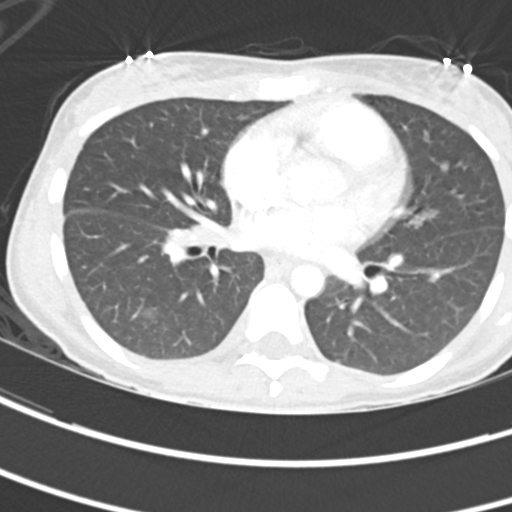
[im 67/140  lung]
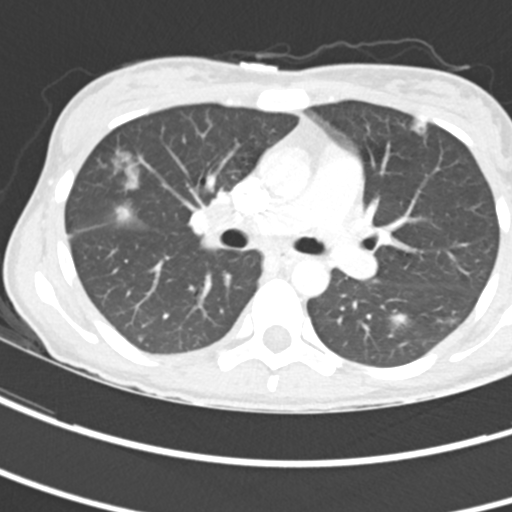
[im 73/140  lung]
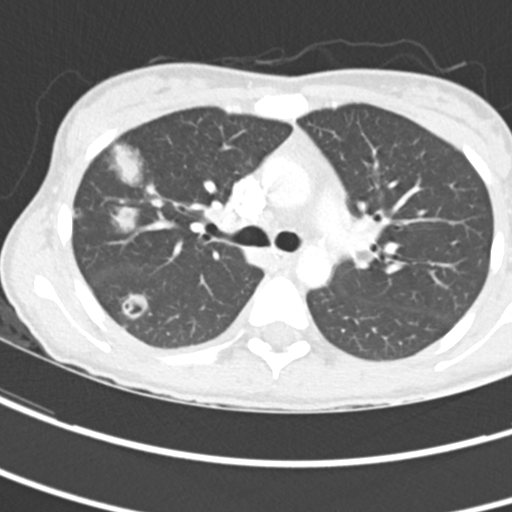
[im 87/140  mediastinal]
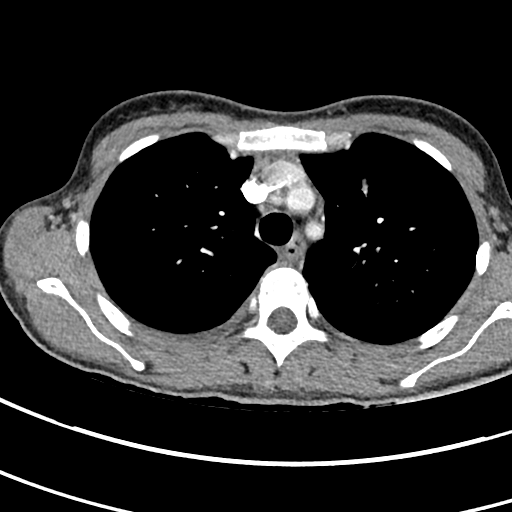
[im 87/140  lung]
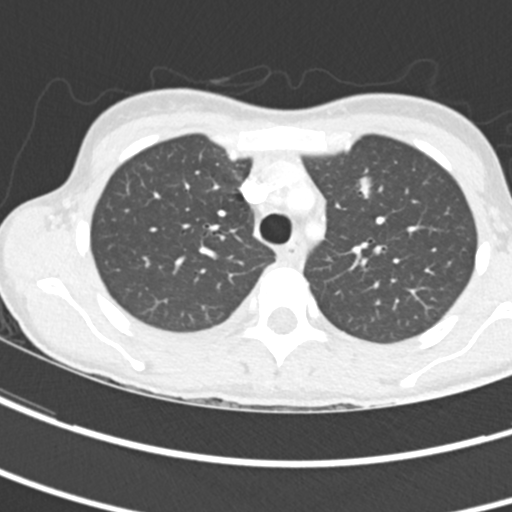
[im 93/140  lung]
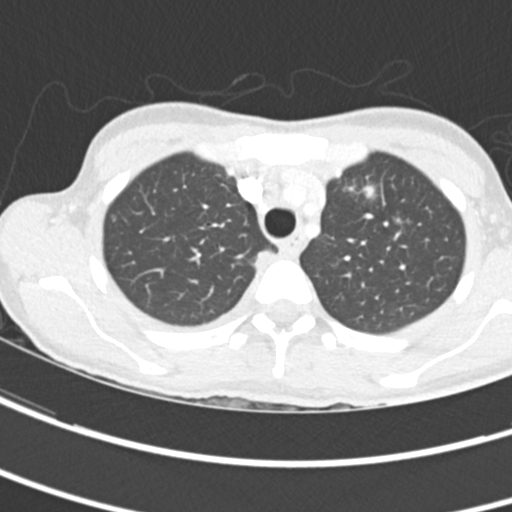
[im 106/140  lung]
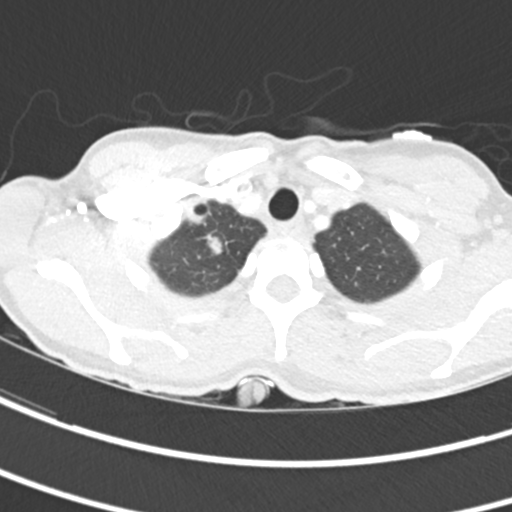
[im 113/140  lung]
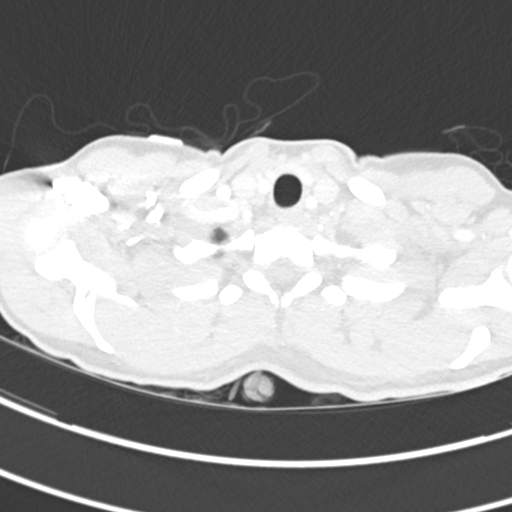
[im 120/140  mediastinal]
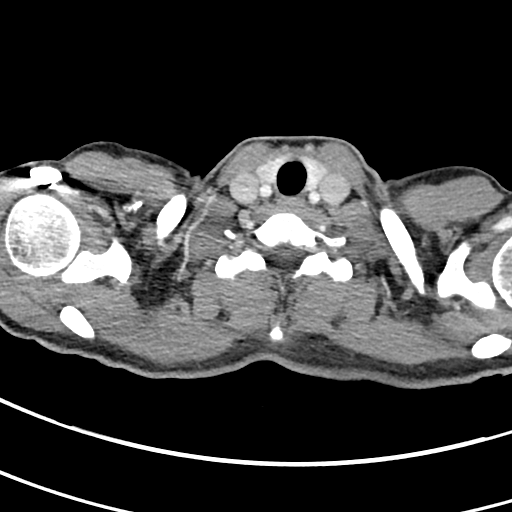
[im 120/140  lung]
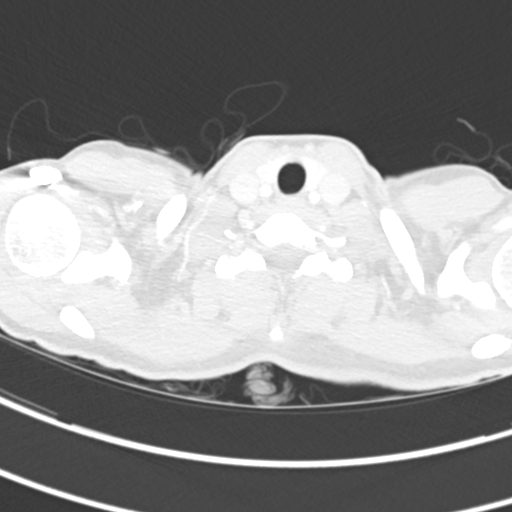
[im 133/140  lung]
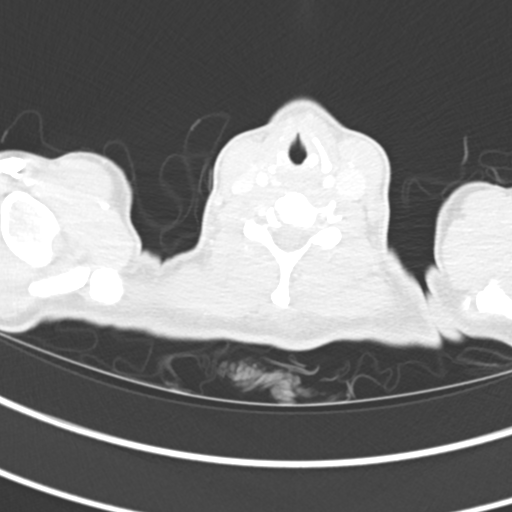

[Series 10: coronal · coronal · 0.48mm/px · 1 of 96 slices shown]
[im 48/96  lung]
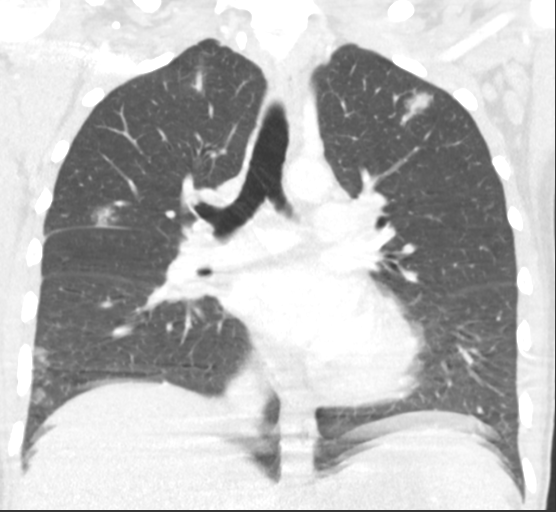

[15 of 36 positions shown; findings below may reference images not displayed]

FINDINGS: Cardiovascular: The heart is normal in size. No pericardial
effusion. Thoracic aorta is normal in caliber.

Mediastinum/Nodes: Patient motion artifact limits assessment,
patient had difficulty tolerating the exam. Enlarged right hilar
node measures 10 mm. Prominent right infrahilar node measures 8 mm.
There is distal esophageal wall thickening. Visualized thyroid gland
is normal.

Lungs/Pleura: Multiple bilateral pulmonary nodules, many of which
are cavitary consistent with septic emboli. Adjacent lesions in the
right upper lobe measure 1.7 and 2.3 cm respectively. Right lower
lobe cavitary lesion measures 3.1 cm. Confluent and ground-glass
opacity with early cavitation in the lateral segment of the right
lower lobe. Cavitary left lower lobe nodule measuring 2.2 x 1.9 cm
abuts the pleura. Additional cavitary and non cavitary nodules in
both lungs. Minimal right pleural thickening/ fluid.

Upper Abdomen: Cyst in the right kidney. No evidence of acute
abnormality.

Musculoskeletal: Motion artifact limits detailed assessment. There
are no acute or suspicious osseous abnormalities.
IMPRESSION: 1. Innumerable bilateral cavitary nodules consistent with septic
emboli, right greater than left. Minimal right pleural
thickening/fluid.
2. Mild right hilar adenopathy is likely reactive.

## 2019-08-30 ENCOUNTER — Encounter (HOSPITAL_COMMUNITY)
Admission: EM | Disposition: A | Discharge status: Home / Self Care | Payer: Self-pay | Source: Ambulatory Visit | Attending: General Surgery

## 2019-08-30 ENCOUNTER — Inpatient Hospital Stay (HOSPITAL_COMMUNITY)
Admission: EM | Admit: 2019-08-30 | Discharge: 2019-09-04 | DRG: 501 | Disposition: A | Discharge status: Home / Self Care | Payer: Self-pay | Source: Ambulatory Visit | Attending: General Surgery | Admitting: General Surgery

## 2019-08-30 ENCOUNTER — Emergency Department (HOSPITAL_COMMUNITY): Payer: Self-pay | Admitting: Certified Registered Nurse Anesthetist

## 2019-08-30 ENCOUNTER — Emergency Department (HOSPITAL_COMMUNITY): Payer: Self-pay

## 2019-08-30 ENCOUNTER — Other Ambulatory Visit: Payer: Self-pay

## 2019-08-30 DIAGNOSIS — F17210 Nicotine dependence, cigarettes, uncomplicated: Secondary | ICD-10-CM | POA: Diagnosis present

## 2019-08-30 DIAGNOSIS — L03114 Cellulitis of left upper limb: Secondary | ICD-10-CM | POA: Diagnosis present

## 2019-08-30 DIAGNOSIS — L02414 Cutaneous abscess of left upper limb: Secondary | ICD-10-CM | POA: Diagnosis present

## 2019-08-30 DIAGNOSIS — Z20822 Contact with and (suspected) exposure to covid-19: Secondary | ICD-10-CM | POA: Diagnosis present

## 2019-08-30 DIAGNOSIS — F1510 Other stimulant abuse, uncomplicated: Secondary | ICD-10-CM | POA: Diagnosis present

## 2019-08-30 DIAGNOSIS — B182 Chronic viral hepatitis C: Secondary | ICD-10-CM | POA: Diagnosis present

## 2019-08-30 DIAGNOSIS — L02512 Cutaneous abscess of left hand: Secondary | ICD-10-CM | POA: Diagnosis present

## 2019-08-30 DIAGNOSIS — M25532 Pain in left wrist: Secondary | ICD-10-CM

## 2019-08-30 DIAGNOSIS — Z8249 Family history of ischemic heart disease and other diseases of the circulatory system: Secondary | ICD-10-CM

## 2019-08-30 DIAGNOSIS — F1990 Other psychoactive substance use, unspecified, uncomplicated: Secondary | ICD-10-CM | POA: Diagnosis present

## 2019-08-30 DIAGNOSIS — I8290 Acute embolism and thrombosis of unspecified vein: Secondary | ICD-10-CM

## 2019-08-30 DIAGNOSIS — Z8679 Personal history of other diseases of the circulatory system: Secondary | ICD-10-CM

## 2019-08-30 DIAGNOSIS — M60042 Infective myositis, left hand: Principal | ICD-10-CM | POA: Diagnosis present

## 2019-08-30 DIAGNOSIS — Z8619 Personal history of other infectious and parasitic diseases: Secondary | ICD-10-CM

## 2019-08-30 DIAGNOSIS — F1410 Cocaine abuse, uncomplicated: Secondary | ICD-10-CM | POA: Diagnosis present

## 2019-08-30 DIAGNOSIS — L02519 Cutaneous abscess of unspecified hand: Secondary | ICD-10-CM | POA: Diagnosis present

## 2019-08-30 DIAGNOSIS — L03119 Cellulitis of unspecified part of limb: Secondary | ICD-10-CM | POA: Diagnosis present

## 2019-08-30 DIAGNOSIS — F1910 Other psychoactive substance abuse, uncomplicated: Secondary | ICD-10-CM | POA: Diagnosis present

## 2019-08-30 DIAGNOSIS — F1120 Opioid dependence, uncomplicated: Secondary | ICD-10-CM | POA: Diagnosis present

## 2019-08-30 DIAGNOSIS — B950 Streptococcus, group A, as the cause of diseases classified elsewhere: Secondary | ICD-10-CM | POA: Diagnosis present

## 2019-08-30 HISTORY — PX: I & D EXTREMITY: SHX5045

## 2019-08-30 LAB — CBC WITH DIFFERENTIAL/PLATELET
Abs Immature Granulocytes: 0.03 10*3/uL (ref 0.00–0.07)
Basophils Absolute: 0 10*3/uL (ref 0.0–0.1)
Basophils Relative: 0 %
Eosinophils Absolute: 0.1 10*3/uL (ref 0.0–0.5)
Eosinophils Relative: 1 %
HCT: 33.1 % — ABNORMAL LOW (ref 36.0–46.0)
Hemoglobin: 10.3 g/dL — ABNORMAL LOW (ref 12.0–15.0)
Immature Granulocytes: 0 %
Lymphocytes Relative: 26 %
Lymphs Abs: 2.7 10*3/uL (ref 0.7–4.0)
MCH: 25.2 pg — ABNORMAL LOW (ref 26.0–34.0)
MCHC: 31.1 g/dL (ref 30.0–36.0)
MCV: 81.1 fL (ref 80.0–100.0)
Monocytes Absolute: 1.3 10*3/uL — ABNORMAL HIGH (ref 0.1–1.0)
Monocytes Relative: 13 %
Neutro Abs: 6 10*3/uL (ref 1.7–7.7)
Neutrophils Relative %: 60 %
Platelets: 332 10*3/uL (ref 150–400)
RBC: 4.08 MIL/uL (ref 3.87–5.11)
RDW: 15.9 % — ABNORMAL HIGH (ref 11.5–15.5)
WBC: 10.1 10*3/uL (ref 4.0–10.5)
nRBC: 0 % (ref 0.0–0.2)

## 2019-08-30 LAB — COMPREHENSIVE METABOLIC PANEL
ALT: 33 U/L (ref 0–44)
AST: 17 U/L (ref 15–41)
Albumin: 3.4 g/dL — ABNORMAL LOW (ref 3.5–5.0)
Alkaline Phosphatase: 71 U/L (ref 38–126)
Anion gap: 10 (ref 5–15)
BUN: 8 mg/dL (ref 6–20)
CO2: 22 mmol/L (ref 22–32)
Calcium: 8.5 mg/dL — ABNORMAL LOW (ref 8.9–10.3)
Chloride: 105 mmol/L (ref 98–111)
Creatinine, Ser: 0.57 mg/dL (ref 0.44–1.00)
GFR calc Af Amer: >60 mL/min (ref >60)
GFR calc non Af Amer: >60 mL/min (ref >60)
Glucose, Bld: 90 mg/dL (ref 70–99)
Potassium: 3.9 mmol/L (ref 3.5–5.1)
Sodium: 137 mmol/L (ref 135–145)
Total Bilirubin: 0.4 mg/dL (ref 0.3–1.2)
Total Protein: 6.9 g/dL (ref 6.5–8.1)

## 2019-08-30 LAB — RESPIRATORY PANEL BY RT PCR (FLU A&B, COVID)
Influenza A by PCR: NEGATIVE
Influenza B by PCR: NEGATIVE
SARS Coronavirus 2 by RT PCR: NEGATIVE

## 2019-08-30 LAB — I-STAT BETA HCG BLOOD, ED (MC, WL, AP ONLY): I-stat hCG, quantitative: <5 m[IU]/mL

## 2019-08-30 LAB — LACTIC ACID, PLASMA: Lactic Acid, Venous: 0.6 mmol/L (ref 0.5–1.9)

## 2019-08-30 SURGERY — IRRIGATION AND DEBRIDEMENT EXTREMITY
Anesthesia: General | Site: Wrist | Laterality: Left

## 2019-08-30 MED ORDER — FENTANYL CITRATE (PF) 250 MCG/5ML IJ SOLN
INTRAMUSCULAR | Status: AC
  Filled 2019-08-30: qty 5

## 2019-08-30 MED ORDER — FENTANYL CITRATE (PF) 100 MCG/2ML IJ SOLN
INTRAMUSCULAR | Status: AC
  Filled 2019-08-30: qty 2

## 2019-08-30 MED ORDER — ONDANSETRON HCL 4 MG/2ML IJ SOLN
INTRAMUSCULAR | Status: DC | PRN
  Administered 2019-08-30: 4 mg via INTRAVENOUS

## 2019-08-30 MED ORDER — DEXMEDETOMIDINE HCL 200 MCG/2ML IV SOLN
INTRAVENOUS | Status: DC | PRN
  Administered 2019-08-30: 20 ug via INTRAVENOUS

## 2019-08-30 MED ORDER — BUPIVACAINE HCL (PF) 0.25 % IJ SOLN
INTRAMUSCULAR | Status: DC | PRN
  Administered 2019-08-30: 20 mL

## 2019-08-30 MED ORDER — PROPOFOL 10 MG/ML IV BOLUS
INTRAVENOUS | Status: AC
  Filled 2019-08-30: qty 20

## 2019-08-30 MED ORDER — HYDROCODONE-ACETAMINOPHEN 5-325 MG PO TABS
1.0000 | ORAL_TABLET | ORAL | Status: DC | PRN
  Administered 2019-08-31 – 2019-09-01 (×6): 2 via ORAL
  Filled 2019-08-30 (×6): qty 2

## 2019-08-30 MED ORDER — MORPHINE SULFATE (PF) 2 MG/ML IV SOLN
0.5000 mg | INTRAVENOUS | Status: DC | PRN
  Administered 2019-08-31 – 2019-09-01 (×5): 1 mg via INTRAVENOUS
  Filled 2019-08-30 (×5): qty 1

## 2019-08-30 MED ORDER — DOUBLE ANTIBIOTIC 500-10000 UNIT/GM EX OINT
TOPICAL_OINTMENT | CUTANEOUS | Status: AC
  Filled 2019-08-30: qty 28.4

## 2019-08-30 MED ORDER — OXYCODONE HCL 5 MG PO TABS
ORAL_TABLET | ORAL | Status: AC
  Filled 2019-08-30: qty 1

## 2019-08-30 MED ORDER — SUCCINYLCHOLINE CHLORIDE 200 MG/10ML IV SOSY
PREFILLED_SYRINGE | INTRAVENOUS | Status: AC
  Filled 2019-08-30: qty 10

## 2019-08-30 MED ORDER — PROPOFOL 10 MG/ML IV BOLUS
INTRAVENOUS | Status: DC | PRN
  Administered 2019-08-30: 15 mg via INTRAVENOUS

## 2019-08-30 MED ORDER — DEXAMETHASONE SODIUM PHOSPHATE 10 MG/ML IJ SOLN
INTRAMUSCULAR | Status: DC | PRN
  Administered 2019-08-30: 10 mg via INTRAVENOUS

## 2019-08-30 MED ORDER — ACETAMINOPHEN 10 MG/ML IV SOLN
1000.0000 mg | Freq: Once | INTRAVENOUS | Status: AC
  Administered 2019-08-30: 1000 mg via INTRAVENOUS

## 2019-08-30 MED ORDER — LACTATED RINGERS IV SOLN: INTRAVENOUS | Status: DC | PRN

## 2019-08-30 MED ORDER — FENTANYL CITRATE (PF) 250 MCG/5ML IJ SOLN
INTRAMUSCULAR | Status: DC | PRN
  Administered 2019-08-30: 100 ug via INTRAVENOUS
  Administered 2019-08-30: 50 ug via INTRAVENOUS
  Administered 2019-08-30: 100 ug via INTRAVENOUS

## 2019-08-30 MED ORDER — BUPIVACAINE HCL (PF) 0.25 % IJ SOLN
INTRAMUSCULAR | Status: AC
  Filled 2019-08-30: qty 30

## 2019-08-30 MED ORDER — TETANUS-DIPHTH-ACELL PERTUSSIS 5-2.5-18.5 LF-MCG/0.5 IM SUSP
0.5000 mL | Freq: Once | INTRAMUSCULAR | Status: AC
  Administered 2019-08-30: 0.5 mL via INTRAMUSCULAR
  Filled 2019-08-30: qty 0.5

## 2019-08-30 MED ORDER — NAPROXEN 375 MG PO TABS
200.0000 mg | ORAL_TABLET | Freq: Two times a day (BID) | ORAL | Status: DC
  Administered 2019-08-31: 187.5 mg via ORAL
  Filled 2019-08-30 (×2): qty 1

## 2019-08-30 MED ORDER — FENTANYL CITRATE (PF) 100 MCG/2ML IJ SOLN
25.0000 ug | INTRAMUSCULAR | Status: DC | PRN
  Administered 2019-08-30 (×3): 50 ug via INTRAVENOUS

## 2019-08-30 MED ORDER — OXYCODONE HCL 5 MG/5ML PO SOLN: 5.0000 mg | Freq: Once | ORAL | Status: AC | PRN

## 2019-08-30 MED ORDER — DIPHENHYDRAMINE HCL 50 MG/ML IJ SOLN
INTRAMUSCULAR | Status: DC | PRN
  Administered 2019-08-30: 12.5 mg via INTRAVENOUS

## 2019-08-30 MED ORDER — ONDANSETRON HCL 4 MG/2ML IJ SOLN: 4.0000 mg | Freq: Once | INTRAMUSCULAR | Status: DC | PRN

## 2019-08-30 MED ORDER — VANCOMYCIN HCL IN DEXTROSE 1-5 GM/200ML-% IV SOLN
1000.0000 mg | Freq: Once | INTRAVENOUS | Status: AC
  Administered 2019-08-30: 1000 mg via INTRAVENOUS
  Filled 2019-08-30: qty 200

## 2019-08-30 MED ORDER — OXYCODONE HCL 5 MG PO TABS
5.0000 mg | ORAL_TABLET | Freq: Once | ORAL | Status: AC | PRN
  Administered 2019-08-30: 5 mg via ORAL

## 2019-08-30 MED ORDER — MIDAZOLAM HCL 2 MG/2ML IJ SOLN
INTRAMUSCULAR | Status: AC
  Filled 2019-08-30: qty 2

## 2019-08-30 MED ORDER — ONDANSETRON HCL 4 MG/2ML IJ SOLN
4.0000 mg | Freq: Once | INTRAMUSCULAR | Status: AC
  Administered 2019-08-30: 4 mg via INTRAVENOUS
  Filled 2019-08-30: qty 2

## 2019-08-30 MED ORDER — LIDOCAINE 2% (20 MG/ML) 5 ML SYRINGE
INTRAMUSCULAR | Status: DC | PRN
  Administered 2019-08-30: 30 mg via INTRAVENOUS

## 2019-08-30 MED ORDER — MORPHINE SULFATE (PF) 4 MG/ML IV SOLN
4.0000 mg | Freq: Once | INTRAVENOUS | Status: AC
  Administered 2019-08-30: 4 mg via INTRAVENOUS
  Filled 2019-08-30: qty 1

## 2019-08-30 MED ORDER — DEXAMETHASONE SODIUM PHOSPHATE 10 MG/ML IJ SOLN
INTRAMUSCULAR | Status: AC
  Filled 2019-08-30: qty 2

## 2019-08-30 MED ORDER — SUCCINYLCHOLINE CHLORIDE 200 MG/10ML IV SOSY
PREFILLED_SYRINGE | INTRAVENOUS | Status: DC | PRN
  Administered 2019-08-30: 110 mg via INTRAVENOUS

## 2019-08-30 MED ORDER — ACETAMINOPHEN 10 MG/ML IV SOLN
INTRAVENOUS | Status: AC
  Filled 2019-08-30: qty 100

## 2019-08-30 MED ORDER — MIDAZOLAM HCL 5 MG/5ML IJ SOLN
INTRAMUSCULAR | Status: DC | PRN
  Administered 2019-08-30: 2 mg via INTRAVENOUS

## 2019-08-30 MED ORDER — 0.9 % SODIUM CHLORIDE (POUR BTL) OPTIME
TOPICAL | Status: DC | PRN
  Administered 2019-08-30: 1000 mL

## 2019-08-30 MED ORDER — SODIUM CHLORIDE 0.9% FLUSH: 3.0000 mL | Freq: Once | INTRAVENOUS | Status: DC

## 2019-08-30 MED ORDER — LIDOCAINE 2% (20 MG/ML) 5 ML SYRINGE
INTRAMUSCULAR | Status: AC
  Filled 2019-08-30: qty 5

## 2019-08-30 MED ORDER — EPHEDRINE 5 MG/ML INJ
INTRAVENOUS | Status: AC
  Filled 2019-08-30: qty 10

## 2019-08-30 MED ORDER — PHENYLEPHRINE 40 MCG/ML (10ML) SYRINGE FOR IV PUSH (FOR BLOOD PRESSURE SUPPORT)
PREFILLED_SYRINGE | INTRAVENOUS | Status: AC
  Filled 2019-08-30: qty 10

## 2019-08-30 MED ORDER — ONDANSETRON HCL 4 MG/2ML IJ SOLN
INTRAMUSCULAR | Status: AC
  Filled 2019-08-30: qty 2

## 2019-08-30 MED ORDER — VANCOMYCIN HCL 750 MG/150ML IV SOLN
750.0000 mg | Freq: Two times a day (BID) | INTRAVENOUS | Status: DC
  Administered 2019-08-31 – 2019-09-01 (×3): 750 mg via INTRAVENOUS
  Filled 2019-08-30 (×4): qty 150

## 2019-08-30 SURGICAL SUPPLY — 47 items
BAG DECANTER FOR FLEXI CONT (MISCELLANEOUS) ×1 IMPLANT
BNDG ELASTIC 3X5.8 VLCR STR LF (GAUZE/BANDAGES/DRESSINGS) ×2 IMPLANT
BNDG ELASTIC 4X5.8 VLCR STR LF (GAUZE/BANDAGES/DRESSINGS) ×2 IMPLANT
BNDG GAUZE ELAST 4 BULKY (GAUZE/BANDAGES/DRESSINGS) ×2 IMPLANT
CORD BIPOLAR FORCEPS 12FT (ELECTRODE) ×2 IMPLANT
COVER WAND RF STERILE (DRAPES) ×3 IMPLANT
CUFF TOURN SGL QUICK 18X4 (TOURNIQUET CUFF) ×2 IMPLANT
DRAPE SURG 17X23 STRL (DRAPES) ×3 IMPLANT
ELECT REM PT RETURN 9FT ADLT (ELECTROSURGICAL) ×3
ELECTRODE REM PT RTRN 9FT ADLT (ELECTROSURGICAL) IMPLANT
GAUZE PACKING IODOFORM 1/4X15 (GAUZE/BANDAGES/DRESSINGS) IMPLANT
GAUZE SPONGE 4X4 12PLY STRL (GAUZE/BANDAGES/DRESSINGS) ×3 IMPLANT
GAUZE XEROFORM 1X8 LF (GAUZE/BANDAGES/DRESSINGS) ×3 IMPLANT
GLOVE BIOGEL M 8.0 STRL (GLOVE) ×3 IMPLANT
GOWN STRL REUS W/ TWL LRG LVL3 (GOWN DISPOSABLE) ×2 IMPLANT
GOWN STRL REUS W/TWL LRG LVL3 (GOWN DISPOSABLE) ×6
HANDPIECE INTERPULSE COAX TIP (DISPOSABLE)
IV NS IRRIG 3000ML ARTHROMATIC (IV SOLUTION) IMPLANT
JET LAVAGE IRRISEPT WOUND (IRRIGATION / IRRIGATOR) ×3
KIT BASIN OR (CUSTOM PROCEDURE TRAY) ×3 IMPLANT
KIT TURNOVER KIT B (KITS) ×3 IMPLANT
LAVAGE JET IRRISEPT WOUND (IRRIGATION / IRRIGATOR) IMPLANT
MANIFOLD NEPTUNE II (INSTRUMENTS) ×3 IMPLANT
NDL 18GX1X1/2 (RX/OR ONLY) (NEEDLE) IMPLANT
NDL HYPO 25GX1X1/2 BEV (NEEDLE) IMPLANT
NEEDLE 18GX1X1/2 (RX/OR ONLY) (NEEDLE) ×3 IMPLANT
NEEDLE HYPO 25GX1X1/2 BEV (NEEDLE) ×3 IMPLANT
NS IRRIG 1000ML POUR BTL (IV SOLUTION) ×3 IMPLANT
PACK ORTHO EXTREMITY (CUSTOM PROCEDURE TRAY) ×3 IMPLANT
PAD ARMBOARD 7.5X6 YLW CONV (MISCELLANEOUS) ×6 IMPLANT
PAD CAST 4YDX4 CTTN HI CHSV (CAST SUPPLIES) IMPLANT
PADDING CAST COTTON 4X4 STRL (CAST SUPPLIES) ×3
SET CYSTO W/LG BORE CLAMP LF (SET/KITS/TRAYS/PACK) ×2 IMPLANT
SET HNDPC FAN SPRY TIP SCT (DISPOSABLE) IMPLANT
SOAP 2 % CHG 4 OZ (WOUND CARE) ×3 IMPLANT
SPONGE LAP 18X18 RF (DISPOSABLE) IMPLANT
SPONGE LAP 4X18 RFD (DISPOSABLE) ×3 IMPLANT
SUT ETHILON 4 0 PS 2 18 (SUTURE) ×2 IMPLANT
SWAB CULTURE ESWAB REG 1ML (MISCELLANEOUS) ×2 IMPLANT
SYR 5ML LUER SLIP (SYRINGE) ×2 IMPLANT
SYR CONTROL 10ML LL (SYRINGE) ×2 IMPLANT
TOWEL GREEN STERILE (TOWEL DISPOSABLE) ×3 IMPLANT
TOWEL GREEN STERILE FF (TOWEL DISPOSABLE) ×3 IMPLANT
TUBE CONNECTING 12'X1/4 (SUCTIONS) ×1
TUBE CONNECTING 12X1/4 (SUCTIONS) ×2 IMPLANT
WATER STERILE IRR 1000ML POUR (IV SOLUTION) ×3 IMPLANT
YANKAUER SUCT BULB TIP NO VENT (SUCTIONS) ×3 IMPLANT

## 2019-08-30 NOTE — Anesthesia Preprocedure Evaluation (Signed)
Anesthesia Evaluation  Patient identified by MRN, date of birth, ID band Patient awake    Reviewed: Allergy & Precautions, NPO status , Patient's Chart, lab work & pertinent test results  Airway Mallampati: II  TM Distance: >3 FB Neck ROM: Full    Dental  (+) Teeth Intact, Dental Advisory Given   Pulmonary    breath sounds clear to auscultation       Cardiovascular  Rhythm:Regular Rate:Normal     Neuro/Psych    GI/Hepatic   Endo/Other    Renal/GU      Musculoskeletal   Abdominal   Peds  Hematology   Anesthesia Other Findings   Reproductive/Obstetrics                             Anesthesia Physical Anesthesia Plan  ASA: III and emergent  Anesthesia Plan: General   Post-op Pain Management:    Induction: Intravenous, Rapid sequence and Cricoid pressure planned  PONV Risk Score and Plan: Ondansetron and Dexamethasone  Airway Management Planned: Oral ETT  Additional Equipment:   Intra-op Plan:   Post-operative Plan: Extubation in OR  Informed Consent: I have reviewed the patients History and Physical, chart, labs and discussed the procedure including the risks, benefits and alternatives for the proposed anesthesia with the patient or authorized representative who has indicated his/her understanding and acceptance.     Dental advisory given  Plan Discussed with: CRNA and Anesthesiologist  Anesthesia Plan Comments:         Anesthesia Quick Evaluation

## 2019-08-30 NOTE — ED Provider Notes (Signed)
[facility] EMERGENCY DEPARTMENT Provider Note   CSN: [id number] Arrival date & time: 08/30/19  1406     History Chief Complaint  Patient presents with  . Wrist Pain    Sherri Francis is a 34 y.o. female past medical history significant for IVDU, osteomyelitis of cervical spine, endocarditis, anemia presents to emergency department today with chief complaint of progressively worsening left wrist pain x2 days.  Patient states she has sharp shooting pain in her wrist that radiates up her arm.  She rates the pain 10 of 10 in severity.  She also noticed that the wrist and hand started to swell last night.  She has tried taking Tylenol and ibuprofen without any symptom relief.  She denies any fall or injury to the wrist. Patient states she last used IV heroin x2 days ago.  She did not inject in her left wrist however.  She also endorses generalized weakness and overall just not feeling well. Patient does admit to fever over the last week that she attributed to withdrawal.  Denies any fever in the last 2 days.  She denies chills, chest pain, shortness of breath, abdominal pain, nausea, vomiting, numbness, weakness, tingling.  Patient went to urgent care prior to arrival and was recommended to have evaluation in the emergency department. Patient reports lat PO intake was 6 pm when she ate a bag of chips, last full meal was at 730 this morning.  Past Medical History:  Diagnosis Date  . Anemia   . Anxiety   . Depression   . Endocarditis   . Hepatitis C   . IVDU (intravenous drug user)   . Osteomyelitis of cervical spine ([place]) 09/2017  . PID (acute pelvic inflammatory disease)   . Suicide attempt [facility])    multiple attempts  . Teratoma   . UTI (urinary tract infection)     Patient Active Problem List   Diagnosis Date Noted  . Sepsis ([place]) 06/03/2018  . Acute pyelonephritis 06/03/2018  . Hyponatremia 06/03/2018  . Polysubstance abuse ([place]) 06/03/2018  . Tobacco abuse  06/03/2018  . Hypokalemia 10/02/2017  . Bacteremia 10/02/2017  . Anxiety and depression   . Osteomyelitis of cervical spine ([place]) 09/22/2017  . IV drug user   . Acute osteomyelitis of cervical spine ([place]) 09/20/2017  . Opioid abuse with opioid-induced mood disorder ([place]) 02/21/2017  . Fever 02/20/2017  . Microcytic anemia 02/20/2017  . Acute bacterial endocarditis   . Substance induced mood disorder ([place]) 01/30/2017  . Opioid type dependence, continuous ([place]) 01/30/2017  . 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 Bacteremia due to Staphylococcus aureus 01/27/2017  . Pneumonia 01/24/2017  . Pelvic abscess in female 01/13/2016  . PID (acute pelvic inflammatory disease) 01/13/2016    Past Surgical History:  Procedure Laterality Date  . LAPAROSCOPY    . TEE WITHOUT CARDIOVERSION N/A 09/27/2017   Procedure: TRANSESOPHAGEAL ECHOCARDIOGRAM (TEE);  Surgeon: Pixie Casino, MD;  Location[facility] ENDOSCOPY;  Service: Cardiovascular;  Laterality: N/A;     OB History    Gravida  0   Para  0   Term  0   Preterm  0   AB  0   Living  0     SAB  0   TAB  0   Ectopic  0   Multiple  0   Live Births  0           Family History  Problem Relation Age of Onset  . Heart disease Maternal Grandfather   . Anxiety disorder Maternal Grandfather   . Heart disease Maternal Aunt   . Alcohol abuse Father   . Drug abuse Father   . Cancer Neg Hx     Social History   Tobacco Use  . Smoking status: Current Every Day Smoker    Packs/day: 0.50    Types: Cigarettes  . Smokeless tobacco: Never Used  Substance Use Topics  . Alcohol use: Yes    Comment: Recovering, 3 years ago   . Drug use: Yes    Types: IV, Cocaine, Methamphetamines    Comment: Heronin: 2 days ago, Methamphetamines: 2 days ago, Deneis cocaine and marijuaina use     Home Medications Prior to Admission medications   Medication Sig Start  Date End Date Taking? Authorizing Provider  acetaminophen (TYLENOL) 500 MG tablet Take 1,000 mg by mouth every 6 (six) hours as needed for mild pain.    [provider]  cephALEXin (KEFLEX) 500 MG capsule Take 2 capsules (1,000 mg total) by mouth 2 (two) times daily. Patient not taking: Reported on 01/20/2018 01/10/18   Charlesetta Shanks, MD  doxycycline (VIBRAMYCIN) 100 MG capsule Take 1 capsule (100 mg total) by mouth 2 (two) times daily. One po bid x 7 days Patient not taking: Reported on 01/20/2018 01/10/18   Charlesetta Shanks, MD  famotidine (PEPCID) 20 MG tablet Take 1 tablet (20 mg total) by mouth 2 (two) times daily as needed for heartburn or indigestion. 01/10/18   Charlesetta Shanks, MD  ibuprofen (ADVIL,MOTRIN) 600 MG tablet Take 1 tablet (600 mg total) by mouth every 6 (six) hours as needed. Patient taking differently: Take 600 mg by mouth every 6 (six) hours as needed for moderate pain.  01/10/18   Charlesetta Shanks, MD  loperamide (IMODIUM A-D) 2 MG tablet Take 2 mg by mouth daily as needed for diarrhea or loose stools.    [provider]  meclizine (ANTIVERT) 12.5 MG tablet Take 12.5 mg by mouth daily as needed for dizziness.    [provider]  ondansetron (ZOFRAN ODT) 4 MG disintegrating tablet Take 1 tablet (4 mg total) by mouth every 4 (four) hours as needed for nausea or vomiting. 01/10/18   Charlesetta Shanks, MD    Allergies    Patient has no known allergies.  Review of Systems   Review of Systems  All other systems are reviewed and are negative for acute change except as noted in the HPI.   Physical Exam Updated Vital Signs BP (!) 144/94 (BP Location: Right Arm)   Pulse 98   Temp 98.4 F (36.9 C) (Oral)   Resp 16   SpO2 100%   Physical Exam Vitals and nursing note reviewed.  Constitutional:      General: She is not in acute distress.    Appearance: She is not ill-appearing.  HENT:     Head: Normocephalic and atraumatic.     Right Ear: Tympanic membrane  and external ear normal.     Left Ear: Tympanic membrane and external ear normal.     Nose: Nose normal.     Mouth/Throat:     Mouth: Mucous membranes are moist.     Pharynx: Oropharynx is clear.  Eyes:     General: No scleral icterus.       Right eye: No discharge.        Left eye: No discharge.     Extraocular Movements: Extraocular movements intact.  Conjunctiva/sclera: Conjunctivae normal.     Pupils: Pupils are equal, round, and reactive to light.  Neck:     Vascular: No JVD.  Cardiovascular:     Rate and Rhythm: Normal rate and regular rhythm.     Pulses: Normal pulses.          Radial pulses are 2+ on the right side and 2+ on the left side.     Heart sounds: Normal heart sounds.  Pulmonary:     Comments: Lungs clear to auscultation in all fields. Symmetric chest rise. No wheezing, rales, or rhonchi. Abdominal:     Comments: Abdomen is soft, non-distended, and non-tender in all quadrants. No rigidity, no guarding. No peritoneal signs.  Musculoskeletal:     Cervical back: Normal range of motion.     Comments: left hand with generalized edema.  Patient has 5 x 3 cm area of blanching erythema on volar aspect of left wrist.  No palpable fluctuance, no induration. the area is exquisitely tender to palpation.  Decreased range of motion secondary to pain. Neurovascularly intact distally. Compartments soft above and below affected joint.     Skin:    General: Skin is warm and dry.     Capillary Refill: Capillary refill takes less than 2 seconds.     Comments: Multiple needle track marks with open and scabbed wounds noted on bilateral forearms.  Neurological:     Mental Status: She is oriented to person, place, and time.     GCS: GCS eye subscore is 4. GCS verbal subscore is 5. GCS motor subscore is 6.     Comments: Fluent speech, no facial droop.  Psychiatric:        Behavior: Behavior normal.       ED Results / Procedures / Treatments   Labs (all labs ordered are  listed, but only abnormal results are displayed) Labs Reviewed  COMPREHENSIVE METABOLIC PANEL - Abnormal; Notable for the following components:      Result Value   Calcium 8.5 (*)    Albumin 3.4 (*)    All other components within normal limits  CBC WITH DIFFERENTIAL/PLATELET - Abnormal; Notable for the following components:   Hemoglobin 10.3 (*)    HCT 33.1 (*)    MCH 25.2 (*)    RDW 15.9 (*)    Monocytes Absolute 1.3 (*)    All other components within normal limits  RESPIRATORY PANEL BY RT PCR (FLU A&B, COVID)  LACTIC ACID, PLASMA  URINALYSIS, ROUTINE W REFLEX MICROSCOPIC  I-STAT BETA HCG BLOOD, ED ([facility], [facility], [facility] ONLY)    EKG None  Radiology DG Wrist Complete Left  Result Date: 08/30/2019 CLINICAL DATA:  Pt here from UC with reports of L wrist pain yesterday with sudden worsening to where she was unable to move thumb/wrist due to pain. Pt with some swelling to R thumb with hx of endocarditis that presented the same way several years ago. EXAM: LEFT WRIST - COMPLETE 3+ VIEW COMPARISON:  None. FINDINGS: No fracture or bone lesion. Joints are normally spaced and aligned.  No arthropathic changes. There is nonspecific soft tissue swelling that is most evident along the radial and volar aspect the wrist and distal forearm, extending into the hand. No soft tissue air. IMPRESSION: 1. No fracture, bone lesion or joint abnormality. 2. Nonspecific soft tissue swelling. Electronically Signed   By: Lajean Manes M.D.   On: 08/30/2019 14:45    Procedures Procedures (including critical care time)  Medications Ordered in ED  Medications  sodium chloride flush (NS) 0.9 % injection 3 mL (3 mLs Intravenous Not Given 08/30/19 1929)  morphine 4 MG/ML injection 4 mg (has no administration in time range)  ondansetron (ZOFRAN) injection 4 mg (has no administration in time range)  Tdap (BOOSTRIX) injection 0.5 mL (has no administration in time range)  vancomycin (VANCOCIN) IVPB 1000 mg/200 mL premix (has  no administration in time range)    ED Course  I have reviewed the triage vital signs and the nursing notes.  Pertinent labs & imaging results that were available during my care of the patient were reviewed by me and considered in my medical decision making (see chart for details).    MDM Rules/Calculators/A&P                      Patient is afebrile, nontoxic-appearing.  She has severe pain of left wrist and decreased range of motion.  She has an area of erythema on the volar aspect of her wrist.  It does not feel like a superficial abscess, there is no palpable fluctuance, no induration. She has track marks on bilateral forearms, last admits to IVDU x 2 days ago in right arm.  Labs collected in triage.  CBC without leukocytosis, hemoglobin 10.3 consistent with her baseline.  CMP without severe electrolyte derangement, no renal insufficiency.  Lactic acid is within normal range.  Pregnancy test negative.  X-ray of left wrist shows nonspecific soft tissue swelling, no fracture or dislocation. Concern for septic joint. Updated patient's tetanus and IV vancomycin ordered. Patient made NPO.  This case was discussed with Dr. Zenia Resides who has seen the patient and agrees with plan to admit.  Case discussed with on call hand attending Dr. Lenon Curt who plans to take patient to the OR tonight for an irrigation and debridement of left wrist.  Covid PCR is pending.  Appreciate consultation   Portions of this note were generated with Dragon dictation software. Dictation errors may occur despite best attempts at proofreading.   Final Clinical Impression(s) / ED Diagnoses Final diagnoses:  Left wrist pain    Rx / DC Orders ED Discharge Orders    None       Flint Melter 08/30/19 2008    Lacretia Leigh, MD 09/01/19 2319

## 2019-08-30 NOTE — Anesthesia Postprocedure Evaluation (Signed)
Anesthesia Post Note  Patient: Sherri Francis  Procedure(s) Performed: IRRIGATION AND DEBRIDEMENT LEFT WRIST (Left Wrist)     Patient location during evaluation: PACU Anesthesia Type: General Level of consciousness: awake Pain management: pain level controlled Vital Signs Assessment: post-procedure vital signs reviewed and stable Respiratory status: spontaneous breathing Cardiovascular status: stable Postop Assessment: no apparent nausea or vomiting Anesthetic complications: no    Last Vitals:  Vitals:   08/30/19 1927 08/30/19 2000  BP: 128/82 129/78  Pulse: 90 86  Resp: 18 16  Temp:    SpO2: 99% 97%    Last Pain:  Vitals:   08/30/19 1900  TempSrc:   PainSc: 10-Worst pain ever                 Raylea Adcox

## 2019-08-30 NOTE — ED Provider Notes (Signed)
Medical screening examination/treatment/procedure(s) were conducted as a shared visit with non-physician practitioner(s) and myself.  I personally evaluated the patient during the encounter.    34 year old female who presents with left volar wrist pain.  Does have history of IV drug abuse.  Concern for septic joint.  Discussed with hand surgeon who will take patient to the OR   Lacretia Leigh, MD 08/30/19 1956

## 2019-08-30 NOTE — H&P (Signed)
Reason for Consult:wrist pain  Referring Physician: ER  CC:My wrist hurts really bad  HPI:  Sherri Francis is an 34 y.o. right handed female who presents with 1 day history of L wrist pain.  Denies trauma, last IV drug R hand 2 da ago, cant remember when last time shot up in L .   Pain is rated at   10 /10 and is described as sharp.  Pain is constant.  Pain is made better by rest/immobilization, worse with motion.   Associated signs/symptoms: multiple track marks bilaterally Previous treatment:    Past Medical History:  Diagnosis Date  . Anemia   . Anxiety   . Depression   . Endocarditis   . Hepatitis C   . IVDU (intravenous drug user)   . Osteomyelitis of cervical spine ([place]) 09/2017  . PID (acute pelvic inflammatory disease)   . Suicide attempt [facility])    multiple attempts  . Teratoma   . UTI (urinary tract infection)     Past Surgical History:  Procedure Laterality Date  . LAPAROSCOPY    . TEE WITHOUT CARDIOVERSION N/A 09/27/2017   Procedure: TRANSESOPHAGEAL ECHOCARDIOGRAM (TEE);  Surgeon: Pixie Casino, MD;  Location: [facility] ENDOSCOPY;  Service: Cardiovascular;  Laterality: N/A;    Family History  Problem Relation Age of Onset  . Heart disease Maternal Grandfather   . Anxiety disorder Maternal Grandfather   . Heart disease Maternal Aunt   . Alcohol abuse Father   . Drug abuse Father   . Cancer Neg Hx     Social History:  reports that she has been smoking cigarettes. She has been smoking about 0.50 packs per day. She has never used smokeless tobacco. She reports current alcohol use. She reports current drug use. Drugs: IV, Cocaine, and Methamphetamines.  Allergies: No Known Allergies  Medications: I have reviewed the patient's current medications.  Results for orders placed or performed during the hospital encounter of 08/30/19 (from the past 48 hour(s))  Lactic acid, plasma     Status: None   Collection Time: 08/30/19  2:30 PM  Result Value Ref Range   Lactic Acid,  Venous 0.6 0.5 - 1.9 mmol/L    Comment: Performed at [facility], [address], [place], [place] [address]  Comprehensive metabolic panel     Status: Abnormal   Collection Time: 08/30/19  2:30 PM  Result Value Ref Range   Sodium 137 135 - 145 mmol/L   Potassium 3.9 3.5 - 5.1 mmol/L   Chloride 105 98 - 111 mmol/L   CO2 22 22 - 32 mmol/L   Glucose, Bld 90 70 - 99 mg/dL    Comment: Glucose reference range applies only to samples taken after fasting for at least 8 hours.   BUN 8 6 - 20 mg/dL   Creatinine, Ser 0.57 0.44 - 1.00 mg/dL   Calcium 8.5 (L) 8.9 - 10.3 mg/dL   Total Protein 6.9 6.5 - 8.1 g/dL   Albumin 3.4 (L) 3.5 - 5.0 g/dL   AST 17 15 - 41 U/L   ALT 33 0 - 44 U/L   Alkaline Phosphatase 71 38 - 126 U/L   Total Bilirubin 0.4 0.3 - 1.2 mg/dL   GFR calc non Af Amer >60 >60 mL/min   GFR calc Af Amer >60 >60 mL/min   Anion gap 10 5 - 15    Comment: Performed at [facility], [place] [address], [place], [place] [address]  CBC with Differential  Status: Abnormal   Collection Time: 08/30/19  2:30 PM  Result Value Ref Range   WBC 10.1 4.0 - 10.5 K/uL   RBC 4.08 3.87 - 5.11 MIL/uL   Hemoglobin 10.3 (L) 12.0 - 15.0 g/dL   HCT 33.1 (L) 36.0 - 46.0 %   MCV 81.1 80.0 - 100.0 fL   MCH 25.2 (L) 26.0 - 34.0 pg   MCHC 31.1 30.0 - 36.0 g/dL   RDW 15.9 (H) 11.5 - 15.5 %   Platelets 332 150 - 400 K/uL   nRBC 0.0 0.0 - 0.2 %   Neutrophils Relative % 60 %   Neutro Abs 6.0 1.7 - 7.7 K/uL   Lymphocytes Relative 26 %   Lymphs Abs 2.7 0.7 - 4.0 K/uL   Monocytes Relative 13 %   Monocytes Absolute 1.3 (H) 0.1 - 1.0 K/uL   Eosinophils Relative 1 %   Eosinophils Absolute 0.1 0.0 - 0.5 K/uL   Basophils Relative 0 %   Basophils Absolute 0.0 0.0 - 0.1 K/uL   Immature Granulocytes 0 %   Abs Immature Granulocytes 0.03 0.00 - 0.07 K/uL    Comment: Performed at [facility], [address], [place], [place] [address]  I-Stat beta hCG blood, ED     Status: None   Collection  Time: 08/30/19  2:44 PM  Result Value Ref Range   I-stat hCG, quantitative <5.0 <5 mIU/mL   Comment 3            Comment:   GEST. AGE      CONC.  (mIU/mL)   <=1 WEEK        5 - 50     2 WEEKS       50 - 500     3 WEEKS       100 - 10,000     4 WEEKS     1,000 - 30,000        FEMALE AND NON-PREGNANT FEMALE:     LESS THAN 5 mIU/mL   Respiratory Panel by RT PCR (Flu A&B, Covid) - Nasopharyngeal Swab     Status: None   Collection Time: 08/30/19  7:45 PM   Specimen: Nasopharyngeal Swab  Result Value Ref Range   SARS Coronavirus 2 by RT PCR NEGATIVE NEGATIVE    Comment: (NOTE) SARS-CoV-2 target nucleic acids are NOT DETECTED. The SARS-CoV-2 RNA is generally detectable in upper respiratoy specimens during the acute phase of infection. The lowest concentration of SARS-CoV-2 viral copies this assay can detect is 131 copies/mL. A negative result does not preclude SARS-Cov-2 infection and should not be used as the sole basis for treatment or other patient management decisions. A negative result may occur with  improper specimen collection/handling, submission of specimen other than nasopharyngeal swab, presence of viral mutation(s) within the areas targeted by this assay, and inadequate number of viral copies (<131 copies/mL). A negative result must be combined with clinical observations, patient history, and epidemiological information. The expected result is Negative. Fact Sheet for Patients:  PinkCheek.be Fact Sheet for Healthcare Providers:  GravelBags.it This test is not yet ap proved or cleared by the [place] FDA and  has been authorized for detection and/or diagnosis of SARS-CoV-2 by FDA under an Emergency Use Authorization (EUA). This EUA will remain  in effect (meaning this test can be used) for the duration of the COVID-19 declaration under Section 564(b)(1) of the Act, 21 U.S.C. section 360bbb-3(b)(1), unless the  authorization is terminated or revoked sooner.  Influenza A by PCR NEGATIVE NEGATIVE   Influenza B by PCR NEGATIVE NEGATIVE    Comment: (NOTE) The Xpert Xpress SARS-CoV-2/FLU/RSV assay is intended as an aid in  the diagnosis of influenza from Nasopharyngeal swab specimens and  should not be used as a sole basis for treatment. Nasal washings and  aspirates are unacceptable for Xpert Xpress SARS-CoV-2/FLU/RSV  testing. Fact Sheet for Patients: PinkCheek.be Fact Sheet for Healthcare Providers: GravelBags.it This test is not yet approved or cleared by the [place] FDA and  has been authorized for detection and/or diagnosis of SARS-CoV-2 by  FDA under an Emergency Use Authorization (EUA). This EUA will remain  in effect (meaning this test can be used) for the duration of the  Covid-19 declaration under Section 564(b)(1) of the Act, 21  U.S.C. section 360bbb-3(b)(1), unless the authorization is  terminated or revoked. Performed at [facility], [place] [address] [place],  [address]     DG Wrist Complete Left  Result Date: 08/30/2019 CLINICAL DATA:  Pt here from UC with reports of L wrist pain yesterday with sudden worsening to where she was unable to move thumb/wrist due to pain. Pt with some swelling to R thumb with hx of endocarditis that presented the same way several years ago. EXAM: LEFT WRIST - COMPLETE 3+ VIEW COMPARISON:  None. FINDINGS: No fracture or bone lesion. Joints are normally spaced and aligned.  No arthropathic changes. There is nonspecific soft tissue swelling that is most evident along the radial and volar aspect the wrist and distal forearm, extending into the hand. No soft tissue air. IMPRESSION: 1. No fracture, bone lesion or joint abnormality. 2. Nonspecific soft tissue swelling. Electronically Signed   By: Lajean Manes M.D.   On: 08/30/2019 14:45    Pertinent items are noted in HPI. Temp:   [98.3 F (36.8 C)-98.4 F (36.9 C)] 98.3 F (36.8 C) (03/24 1725) Pulse Rate:  [80-98] 86 (03/24 2000) Resp:  [16-18] 16 (03/24 2000) BP: (119-144)/(65-94) 129/78 (03/24 2000) SpO2:  [97 %-100 %] 97 % (03/24 2000) Weight:  [49.9 kg] 49.9 kg (03/24 1929) General appearance: alert and cooperative Resp: clear to auscultation bilaterally Cardio: regular rate and rhythm Extremities: multiple track marks bilateral hands, L volar wrist with swelling, erythema, tender with wrist and finger motion, concerning for deep space infection/ ? joint involvement   Assessment: ? Deep abscess vs septic wrist joint Plan: Needs I&D I have discussed this treatment plan in detail with patient , including the risks of the recommended  surgery, the benefits and the alternatives.  The patient understands that additional treatment may be necessary.  Jermisha Hoffart C Cloy Cozzens 08/30/2019, 9:30 PM

## 2019-08-30 NOTE — Progress Notes (Signed)
Report attempted x 1

## 2019-08-30 NOTE — Progress Notes (Signed)
Pharmacy Antibiotic Note  Sherri Francis is a 34 y.o. female admitted on 08/30/2019 with possible septic joint. Pharmacy has been consulted for vancomycin dosing.  She presents with left wrist pain and has a history of IV drug abuse. Concern for septic joint. Hand surgeon will take patient to the OR tonight for I&D.   Plan: Vancomycin 1000mg  IV x1 then 750mg  IV Q12h Goal AUC 400-550. Expected AUC: 448 SCr used: 0.57 F/u clinical progress, c/s, de-escalation, and LOT  Height: 5\' 1"  (154.9 cm) Weight: 110 lb 0.2 oz (49.9 kg) IBW/kg (Calculated) : 47.8  Temp (24hrs), Avg:98.4 F (36.9 C), Min:98.3 F (36.8 C), Max:98.4 F (36.9 C)  Recent Labs  Lab 08/30/19 1430  WBC 10.1  CREATININE 0.57  LATICACIDVEN 0.6    Estimated Creatinine Clearance: 76.2 mL/min (by C-G formula based on SCr of 0.57 mg/dL).    No Known Allergies  Antimicrobials this admission: 3/24 vancomycin >>   Dose adjustments this admission: N/A  Microbiology results: N/A  Thank you for allowing pharmacy to be a part of this patient's care.  Kennon Holter, PharmD PGY1 Ambulatory Care Pharmacy Resident 08/30/2019 8:17 PM

## 2019-08-30 NOTE — Transfer of Care (Signed)
Immediate Anesthesia Transfer of Care Note  Patient: Sherri Francis  Procedure(s) Performed: IRRIGATION AND DEBRIDEMENT LEFT WRIST (Left Wrist)  Patient Location: PACU  Anesthesia Type:General  Level of Consciousness: awake, alert , oriented and patient cooperative  Airway & Oxygen Therapy: Patient Spontanous Breathing and Patient connected to nasal cannula oxygen  Post-op Assessment: Report given to RN and Post -op Vital signs reviewed and stable  Post vital signs: Reviewed and stable  Last Vitals:  Vitals Value Taken Time  BP 134/72 08/30/19 2231  Temp    Pulse 102 08/30/19 2232  Resp 20 08/30/19 2232  SpO2 99 % 08/30/19 2232  Vitals shown include unvalidated device data.  Last Pain:  Vitals:   08/30/19 1900  TempSrc:   PainSc: 10-Worst pain ever         Complications: No apparent anesthesia complications

## 2019-08-30 NOTE — Anesthesia Postprocedure Evaluation (Signed)
Anesthesia Post Note  Patient: Sherri Francis  Procedure(s) Performed: IRRIGATION AND DEBRIDEMENT LEFT WRIST (Left Wrist)     Patient location during evaluation: PACU Anesthesia Type: General Level of consciousness: awake Pain management: pain level controlled Vital Signs Assessment: post-procedure vital signs reviewed and stable Respiratory status: spontaneous breathing Cardiovascular status: stable Postop Assessment: no apparent nausea or vomiting Anesthetic complications: no    Last Vitals:  Vitals:   08/30/19 2245 08/30/19 2300  BP: 140/87 (!) 150/82  Pulse: 94 92  Resp: 14 20  Temp:    SpO2: 100% 99%    Last Pain:  Vitals:   08/30/19 2230  TempSrc:   PainSc: Asleep                 Serah Nicoletti

## 2019-08-30 NOTE — Progress Notes (Signed)
Report attempted x2

## 2019-08-30 NOTE — Anesthesia Procedure Notes (Signed)
Procedure Name: Intubation Date/Time: 08/30/2019 9:52 PM Performed by: Shirlyn Goltz, CRNA Pre-anesthesia Checklist: Patient identified, Emergency Drugs available, Suction available and Patient being monitored Patient Re-evaluated:Patient Re-evaluated prior to induction Oxygen Delivery Method: Circle system utilized Preoxygenation: Pre-oxygenation with 100% oxygen Induction Type: IV induction, Rapid sequence and Cricoid Pressure applied Laryngoscope Size: Mac and 3 Grade View: Grade I Tube type: Oral Tube size: 7.0 mm Number of attempts: 1 Airway Equipment and Method: Stylet Placement Confirmation: ETT inserted through vocal cords under direct vision,  positive ETCO2 and breath sounds checked- equal and bilateral Secured at: 23 cm Tube secured with: Tape Dental Injury: Teeth and Oropharynx as per pre-operative assessment

## 2019-08-30 NOTE — ED Notes (Signed)
Lactic /wbc are WNL

## 2019-08-30 NOTE — ED Triage Notes (Addendum)
Pt here from UC with reports of L wrist pain yesterday with sudden worsening to where she was unable to move thumb/wrist due to pain. Pt with some swelling to R thumb with hx of endocarditis that presented the same way several years ago. Pt also endorses "feeling bad" for a few days.

## 2019-08-31 ENCOUNTER — Encounter (HOSPITAL_COMMUNITY): Payer: Self-pay | Admitting: General Surgery

## 2019-08-31 MED ORDER — NAPROXEN 250 MG PO TABS
250.0000 mg | ORAL_TABLET | Freq: Two times a day (BID) | ORAL | Status: DC
  Administered 2019-08-31 – 2019-09-01 (×3): 250 mg via ORAL
  Filled 2019-08-31 (×4): qty 1

## 2019-08-31 MED ORDER — PIPERACILLIN-TAZOBACTAM 3.375 G IVPB
3.3750 g | Freq: Three times a day (TID) | INTRAVENOUS | Status: DC
  Administered 2019-08-31 – 2019-09-03 (×8): 3.375 g via INTRAVENOUS
  Filled 2019-08-31 (×8): qty 50

## 2019-08-31 NOTE — Progress Notes (Signed)
Pharmacy Antibiotic Note  Sherri Francis is a 34 y.o. female admitted on 08/30/2019 with cellulitis. Currently on vancomycin 750 mg IV q12h per pharmacy consult.  Pharmacy has also been consulted for Zosyn dosing.    Plan: Zosyn 3.375g IV q8h (4 hour infusion).  Height: 5\' 1"  (154.9 cm) Weight: 110 lb 0.2 oz (49.9 kg) IBW/kg (Calculated) : 47.8  Temp (24hrs), Avg:98.4 F (36.9 C), Min:97.7 F (36.5 C), Max:98.9 F (37.2 C)  Recent Labs  Lab 08/30/19 1430  WBC 10.1  CREATININE 0.57  LATICACIDVEN 0.6    Estimated Creatinine Clearance: 76.2 mL/min (by C-G formula based on SCr of 0.57 mg/dL).    No Known Allergies  Antimicrobials this admission: 08/30/19 vancomycin  >> 08/31/19 Zosyn  >>    Microbiology results: 08/30/19 wound:  pending 08/30/19 Respiratory panel: negative  Thank you for allowing pharmacy to be a part of this patient's care.  Efraim Kaufmann, PharmD, BCPS 08/31/2019 7:23 PM

## 2019-08-31 NOTE — Op Note (Signed)
NAMESHERMYA, ATTEBERY MEDICAL RECORD [id number] ACCOUNT [id number] DATE OF BIRTH:1986-05-15 FACILITY: [facility] LOCATION: [facility] PHYSICIAN:Louiza Moor C. Kortnee Bas, MD  OPERATIVE REPORT  DATE OF PROCEDURE:  08/30/2019  PREOPERATIVE DIAGNOSIS:  Cellulitis and possible deep abscess and septic joint of the left wrist.  POSTOPERATIVE DIAGNOSIS:  Cellulitis and possible deep abscess and septic joint of the left wrist.  PROCEDURE:   1.  Incision and drainage of left wrist including deep fascia. 2.  Arthrotomy with joint wrist radiocarpal joint aspiration.  INDICATIONS:  The patient is a 34 year old female with history of IV drug use comes in with approximately 2 days of left wrist pain.  She has erythema on the volar aspect of the wrist.  It is very tender for her to flex and extend the wrist.  There was  concern for septic joint or deep abscess.  Risks, benefits and alternatives of surgery were discussed with her.  She agreed with I and D.  Consent was obtained.  DESCRIPTION OF PROCEDURE:  The patient was taken to the operating room and placed supine on the operating table.  The left upper extremity was prepped and draped in normal sterile fashion.  Small incision over the volar wrist over the FCR tendon was then  made.  There was no purulence encountered in the subcutaneous tissues.  The deep fascia underlying the FCR tendon was then incised.  There was again no fluid.  Probing of this deep compartment did not reveal any loculated pus or fluid collections.   Following the dorsal wrist was evaluated.  The radiocarpal joint was palpated.  It did not feel to be bulging.  Access of the wrist joint with an 18-gauge needle was then performed.  There was no significant fluid obtained.  There was a small amount of  what appeared to be normal joint fluid.  Following the tourniquet was released.  Hemostasis was controlled.  The small volar incision was loosely closed with multiple interrupted 4-0 nylon sutures and a  sterile dressing was applied.  The patient will be  admitted overnight in the hospital and continued on her vancomycin for her infection.  ESTIMATED BLOOD LOSS:  Minimal.    COMPLICATIONS:  No acute complications.    SPECIMENS:  There was a culture taken of the deep space.  However, I doubt that this will return anything of value.  CN/NUANCE  D:08/30/2019 T:08/31/2019 JOB:[id number]

## 2019-08-31 NOTE — Progress Notes (Signed)
S:  Pt states hand still hurts.  O:Blood pressure 127/71, pulse 89, temperature 98.8 F (37.1 C), temperature source Oral, resp. rate 16, height 5\' 1"  (1.549 m), weight 49.9 kg, last menstrual period 08/17/2019, SpO2 98 %.  Dressing removed, forearm still swollen, warm to touch, with some erythema entire arm, no purulent drainage from loosely approximated wound Gram stain - GPC strept pyogenes  A:s/p I&D L wrist - still with significant swelling, warmth, erythema   P: cont hospital stay, consult ID for ? Additional coverage with arm still significantly swollen .

## 2019-08-31 NOTE — Plan of Care (Signed)
  Problem: Skin Integrity: Goal: Skin integrity will improve Outcome: Progressing   

## 2019-09-01 DIAGNOSIS — B182 Chronic viral hepatitis C: Secondary | ICD-10-CM | POA: Diagnosis present

## 2019-09-01 DIAGNOSIS — L03119 Cellulitis of unspecified part of limb: Secondary | ICD-10-CM

## 2019-09-01 DIAGNOSIS — F17210 Nicotine dependence, cigarettes, uncomplicated: Secondary | ICD-10-CM

## 2019-09-01 DIAGNOSIS — L02519 Cutaneous abscess of unspecified hand: Secondary | ICD-10-CM

## 2019-09-01 LAB — CBC WITH DIFFERENTIAL/PLATELET
Abs Immature Granulocytes: 0.1 10*3/uL — ABNORMAL HIGH (ref 0.00–0.07)
Basophils Absolute: 0 10*3/uL (ref 0.0–0.1)
Basophils Relative: 0 %
Eosinophils Absolute: 0.1 10*3/uL (ref 0.0–0.5)
Eosinophils Relative: 1 %
HCT: 33 % — ABNORMAL LOW (ref 36.0–46.0)
Hemoglobin: 10.6 g/dL — ABNORMAL LOW (ref 12.0–15.0)
Immature Granulocytes: 1 %
Lymphocytes Relative: 39 %
Lymphs Abs: 4.1 10*3/uL — ABNORMAL HIGH (ref 0.7–4.0)
MCH: 25.4 pg — ABNORMAL LOW (ref 26.0–34.0)
MCHC: 32.1 g/dL (ref 30.0–36.0)
MCV: 78.9 fL — ABNORMAL LOW (ref 80.0–100.0)
Monocytes Absolute: 1 10*3/uL (ref 0.1–1.0)
Monocytes Relative: 10 %
Neutro Abs: 5.3 10*3/uL (ref 1.7–7.7)
Neutrophils Relative %: 49 %
Platelets: 380 10*3/uL (ref 150–400)
RBC: 4.18 MIL/uL (ref 3.87–5.11)
RDW: 16 % — ABNORMAL HIGH (ref 11.5–15.5)
WBC: 10.6 10*3/uL — ABNORMAL HIGH (ref 4.0–10.5)
nRBC: 0 % (ref 0.0–0.2)

## 2019-09-01 MED ORDER — LOPERAMIDE HCL 2 MG PO CAPS
2.0000 mg | ORAL_CAPSULE | ORAL | Status: DC | PRN
  Administered 2019-09-04: 4 mg via ORAL
  Filled 2019-09-01: qty 2

## 2019-09-01 MED ORDER — METHADONE HCL 5 MG/5ML PO SOLN: 35.0000 mg | Freq: Every day | ORAL | Status: DC

## 2019-09-01 MED ORDER — DICYCLOMINE HCL 20 MG PO TABS
20.0000 mg | ORAL_TABLET | Freq: Four times a day (QID) | ORAL | Status: DC | PRN
  Filled 2019-09-01: qty 1

## 2019-09-01 MED ORDER — NICOTINE 7 MG/24HR TD PT24
7.0000 mg | MEDICATED_PATCH | Freq: Every day | TRANSDERMAL | Status: DC
  Filled 2019-09-01 (×4): qty 1

## 2019-09-01 MED ORDER — METHADONE HCL 10 MG PO TABS
35.0000 mg | ORAL_TABLET | Freq: Every day | ORAL | Status: DC
  Administered 2019-09-01 – 2019-09-04 (×4): 35 mg via ORAL
  Filled 2019-09-01 (×4): qty 4

## 2019-09-01 MED ORDER — ONDANSETRON 4 MG PO TBDP: 4.0000 mg | ORAL_TABLET | Freq: Four times a day (QID) | ORAL | Status: DC | PRN

## 2019-09-01 MED ORDER — METHOCARBAMOL 500 MG PO TABS
500.0000 mg | ORAL_TABLET | Freq: Three times a day (TID) | ORAL | Status: DC | PRN
  Administered 2019-09-01 – 2019-09-03 (×3): 500 mg via ORAL
  Filled 2019-09-01 (×3): qty 1

## 2019-09-01 MED ORDER — NAPROXEN 250 MG PO TABS
500.0000 mg | ORAL_TABLET | Freq: Two times a day (BID) | ORAL | Status: DC | PRN
  Filled 2019-09-01: qty 2

## 2019-09-01 MED ORDER — CLINDAMYCIN PHOSPHATE 900 MG/50ML IV SOLN
900.0000 mg | Freq: Three times a day (TID) | INTRAVENOUS | Status: AC
  Administered 2019-09-01 – 2019-09-04 (×8): 900 mg via INTRAVENOUS
  Filled 2019-09-01 (×9): qty 50

## 2019-09-01 MED ORDER — HYDROXYZINE HCL 25 MG PO TABS
25.0000 mg | ORAL_TABLET | Freq: Four times a day (QID) | ORAL | Status: DC | PRN
  Administered 2019-09-03: 25 mg via ORAL
  Filled 2019-09-01: qty 1

## 2019-09-01 NOTE — Plan of Care (Signed)
?  Problem: Clinical Measurements: ?Goal: Ability to avoid or minimize complications of infection will improve ?Outcome: Progressing ?  ?Problem: Skin Integrity: ?Goal: Skin integrity will improve ?Outcome: Progressing ?  ?

## 2019-09-01 NOTE — Consult Note (Signed)
[facility] for Infectious Disease    Date of Admission:  08/30/2019           Day 3 vancomycin        Day 2 piperacillin tazobactam       Reason for Consult: Left wrist infection    Referring Provider: Dr. Melvyn Novas  Assessment: She has left wrist infection with group A strep.  This probably occurred at the site of a recent injection.  These infections are often polymicrobial.  We will continue piperacillin tazobactam pending further observation and change vancomycin to IV clindamycin which is particularly useful for severe group A strep infections.  She tells me that she is seen in the local methadone clinic and takes methadone 35 mg daily.  She is asking that it be restarted here.    Plan: 1. Continue piperacillin tazobactam 2. Change vancomycin to clindamycin 3. Restart her daily methadone  Principal Problem:   Cellulitis and abscess of hand Active Problems:   IV drug user   Polysubstance abuse ([place])   Chronic hepatitis C without hepatic coma (HCC)   Scheduled Meds: . naproxen  250 mg Oral BID WC  . sodium chloride flush  3 mL Intravenous Once   Continuous Infusions: . piperacillin-tazobactam (ZOSYN)  IV 3.375 g (09/01/19 1356)  . vancomycin 750 mg (09/01/19 0922)   PRN Meds:.HYDROcodone-acetaminophen, morphine injection  HPI: Sherri Francis is a 34 y.o. female with a history of addiction, polysubstance abuse and injecting drug use.  Hospitalized here in the past with MSSA bacteremia, tricuspid valve endocarditis and C-spine infection.  She was admitted 2 days ago with worsening left wrist pain.  She was seen and evaluated by Dr. Lenon Curt who decided to take her to the operating room for emergent I&D.  No purulence was encountered.  Fluid is aspirated from the joint and Gram stain revealed gram-positive cocci.  Cultures grew group A strep.  He has been on broad empiric antibiotic therapy.  She has had an increase in postoperative swelling from her elbow to her  fingertips with scattered erythema.   Review of Systems: Review of Systems  Constitutional: Positive for malaise/fatigue. Negative for chills, diaphoresis and fever.  Respiratory: Negative for cough and shortness of breath.   Cardiovascular: Negative for chest pain.  Gastrointestinal: Negative for abdominal pain, diarrhea, nausea and vomiting.  Genitourinary: Negative for dysuria.  Musculoskeletal: Positive for joint pain.  Psychiatric/Behavioral: Positive for substance abuse.    Past Medical History:  Diagnosis Date  . Anemia   . Anxiety   . Depression   . Endocarditis   . Hepatitis C   . IVDU (intravenous drug user)   . Osteomyelitis of cervical spine ([place]) 09/2017  . PID (acute pelvic inflammatory disease)   . Suicide attempt [facility])    multiple attempts  . Teratoma   . UTI (urinary tract infection)     Social History   Tobacco Use  . Smoking status: Current Every Day Smoker    Packs/day: 0.25    Types: Cigarettes  . Smokeless tobacco: Never Used  . Tobacco comment: about 2 to 3  cigg per day   Substance Use Topics  . Alcohol use: Yes    Comment: Recovering, 3 years ago   . Drug use: Yes    Types: IV, Cocaine, Methamphetamines    Comment: pt denies any current use , states she goes to methadone clinic q am     Family History  Problem  Relation Age of Onset  . Heart disease Maternal Grandfather   . Anxiety disorder Maternal Grandfather   . Heart disease Maternal Aunt   . Alcohol abuse Father   . Drug abuse Father   . Cancer Neg Hx    No Known Allergies  OBJECTIVE: Blood pressure 105/66, pulse 75, temperature 98.4 F (36.9 C), temperature source Oral, resp. rate 19, height 5\' 1"  (1.549 m), weight 49.9 kg, last menstrual period 08/17/2019, SpO2 98 %.  Physical Exam Constitutional:      Comments: She is slightly drowsy.  She is pleasant and in no distress.  Cardiovascular:     Rate and Rhythm: Normal rate and regular rhythm.     Heart sounds: No murmur.    Pulmonary:     Effort: Pulmonary effort is normal.     Breath sounds: Normal breath sounds.  Musculoskeletal:     Comments: Her left wrist incision is clean covered by a clean surgical dressing.  She has diffuse swelling of her forearm, wrist and hand with scattered erythema she has pain on palpation of her hand and distal forearm.  Skin:    Comments: She has scattered track marks on her hands and forearms  Psychiatric:        Mood and Affect: Mood normal.     Lab Results Lab Results  Component Value Date   WBC 10.6 (H) 09/01/2019   HGB 10.6 (L) 09/01/2019   HCT 33.0 (L) 09/01/2019   MCV 78.9 (L) 09/01/2019   PLT 380 09/01/2019    Lab Results  Component Value Date   CREATININE 0.57 08/30/2019   BUN 8 08/30/2019   NA 137 08/30/2019   K 3.9 08/30/2019   CL 105 08/30/2019   CO2 22 08/30/2019    Lab Results  Component Value Date   ALT 33 08/30/2019   AST 17 08/30/2019   ALKPHOS 71 08/30/2019   BILITOT 0.4 08/30/2019     Microbiology: Recent Results (from the past 240 hour(s))  Respiratory Panel by RT PCR (Flu A&B, Covid) - Nasopharyngeal Swab     Status: None   Collection Time: 08/30/19  7:45 PM   Specimen: Nasopharyngeal Swab  Result Value Ref Range Status   SARS Coronavirus 2 by RT PCR NEGATIVE NEGATIVE Final    Comment: (NOTE) SARS-CoV-2 target nucleic acids are NOT DETECTED. The SARS-CoV-2 RNA is generally detectable in upper respiratoy specimens during the acute phase of infection. The lowest concentration of SARS-CoV-2 viral copies this assay can detect is 131 copies/mL. A negative result does not preclude SARS-Cov-2 infection and should not be used as the sole basis for treatment or other patient management decisions. A negative result may occur with  improper specimen collection/handling, submission of specimen other than nasopharyngeal swab, presence of viral mutation(s) within the areas targeted by this assay, and inadequate number of viral copies (<131  copies/mL). A negative result must be combined with clinical observations, patient history, and epidemiological information. The expected result is Negative. Fact Sheet for Patients:  PinkCheek.be Fact Sheet for Healthcare Providers:  GravelBags.it This test is not yet ap proved or cleared by the [place] FDA and  has been authorized for detection and/or diagnosis of SARS-CoV-2 by FDA under an Emergency Use Authorization (EUA). This EUA will remain  in effect (meaning this test can be used) for the duration of the COVID-19 declaration under Section 564(b)(1) of the Act, 21 U.S.C. section 360bbb-3(b)(1), unless the authorization is terminated or revoked sooner.    Influenza  A by PCR NEGATIVE NEGATIVE Final   Influenza B by PCR NEGATIVE NEGATIVE Final    Comment: (NOTE) The Xpert Xpress SARS-CoV-2/FLU/RSV assay is intended as an aid in  the diagnosis of influenza from Nasopharyngeal swab specimens and  should not be used as a sole basis for treatment. Nasal washings and  aspirates are unacceptable for Xpert Xpress SARS-CoV-2/FLU/RSV  testing. Fact Sheet for Patients: PinkCheek.be Fact Sheet for Healthcare Providers: GravelBags.it This test is not yet approved or cleared by the [place] FDA and  has been authorized for detection and/or diagnosis of SARS-CoV-2 by  FDA under an Emergency Use Authorization (EUA). This EUA will remain  in effect (meaning this test can be used) for the duration of the  Covid-19 declaration under Section 564(b)(1) of the Act, 21  U.S.C. section 360bbb-3(b)(1), unless the authorization is  terminated or revoked. Performed at [facility], [place] [address] [place], [place] [address]   Aerobic/Anaerobic Culture (surgical/deep wound)     Status: None (Preliminary result)   Collection Time: 08/30/19 10:08 PM   Specimen: Forearm,  Left; Tissue  Result Value Ref Range Status   Specimen Description WOUND BLOOD LEFT FOREARM  Final   Special Requests DEEP  Final   Gram Stain   Final    FEW WBC PRESENT, PREDOMINANTLY PMN RARE GRAM POSITIVE COCCI Performed at [facility], [place] [address], [place], [place] [address]    Culture   Final    FEW STREPTOCOCCUS PYOGENES NO ANAEROBES ISOLATED; CULTURE IN PROGRESS FOR 5 DAYS    Report Status PENDING  Incomplete    Michel Bickers, MD [facility] for Infectious [place] [facility] [phone] pager   [phone] cell 09/01/2019, 2:13 PM

## 2019-09-01 NOTE — Progress Notes (Signed)
S:hand/arm still sore  O:Blood pressure 105/66, pulse 75, temperature 98.4 F (36.9 C), temperature source Oral, resp. rate 19, height 5\' 1"  (1.549 m), weight 49.9 kg, last menstrual period 08/17/2019, SpO2 98 %.  Forearm to fingers still swollen, unchanged from yesterday, still with some erythema to elbow.  No real point tenderness/fluctuance c/w abscess, I&D site looks ok without drainage   A:s/p I&D   P:zosyn added, final cultures still pending, still concerning for continued edema/swelling/cellulitis .  ? Venous thrombosis will get ultrasound

## 2019-09-01 NOTE — Consult Note (Signed)
Medical Consultation   Sherri Francis  [id number]  DOB: 05/28/1986  DOA: 08/30/2019  PCP: Patient, No Pcp Per   Outpatient Specialists: None   Requesting physician: Lenon Curt  Reason for consultation: IVDU admitted Wednesday.  Wrist abscess, now with cellulitis.  [place] ordered, ID to see.  Started on Methadone recently.   History of Present Illness: Sherri Francis is an 34 y.o. female with h/o polysubstance abuse (cocaine, methamphetamine, heroin,tobacco); depression/anxiety; HCV; endocarditis; C-spine osteomyelitis; and PID presenting on 3/24 with wrist pain.  She reports having an infection associated with IVDA.  She had I&D and is continuing to have pain and swelling but it does feel better.  Minimally erythema.  No fevers.  She was recently started on Methadone and would like very much to resume the medication - she thinks some of her pain and nausea may be associated with opiate withdrawal.    Review of Systems:  ROS As per HPI otherwise 10 point review of systems negative.    Past Medical History: Past Medical History:  Diagnosis Date  . Anemia   . Anxiety   . Depression   . Endocarditis   . Hepatitis C   . IVDU (intravenous drug user)   . Osteomyelitis of cervical spine ([place]) 09/2017  . PID (acute pelvic inflammatory disease)   . Suicide attempt [facility])    multiple attempts  . Teratoma   . UTI (urinary tract infection)     Past Surgical History: Past Surgical History:  Procedure Laterality Date  . I & D EXTREMITY Left 08/30/2019   Procedure: IRRIGATION AND DEBRIDEMENT LEFT WRIST;  Surgeon: Dayna Barker, MD;  Location: [facility];  Service: Plastics;  Laterality: Left;  . LAPAROSCOPY    . TEE WITHOUT CARDIOVERSION N/A 09/27/2017   Procedure: TRANSESOPHAGEAL ECHOCARDIOGRAM (TEE);  Surgeon: Pixie Casino, MD;  Location: [facility] ENDOSCOPY;  Service: Cardiovascular;  Laterality: N/A;     Allergies:  No Known Allergies   Social History:  reports that she  has been smoking cigarettes. She has been smoking about 0.25 packs per day. She has never used smokeless tobacco. She reports current alcohol use. She reports current drug use. Drugs: IV, Cocaine, and Methamphetamines.   Family History: Family History  Problem Relation Age of Onset  . Heart disease Maternal Grandfather   . Anxiety disorder Maternal Grandfather   . Heart disease Maternal Aunt   . Alcohol abuse Father   . Drug abuse Father   . Cancer Neg Hx       Physical Exam: Vitals:   08/31/19 1943 09/01/19 0259 09/01/19 0300 09/01/19 0723  BP: 122/71 (!) 185/114 133/86 105/66  Pulse: 81 98 77 75  Resp: 17 18 17 19   Temp: 98.7 F (37.1 C) 98.4 F (36.9 C) 98.4 F (36.9 C) 98.4 F (36.9 C)  TempSrc: Oral Oral Oral Oral  SpO2: 98% 99% 98% 98%  Weight:      Height:        Constitutional: Alert and awake, oriented x3, not in any acute distress. Eyes: EOMI, irises appear normal, anicteric sclera,  ENMT: external ears and nose appear normal, normal hearing, Lips appear normal Neck: neck appears normal, no masses, normal ROM, no thyromegaly, no JVD  CVS: S1-S2 clear, no murmur rubs or gallops, no LE edema, normal pedal pulses  Respiratory:  clear to auscultation bilaterally, no wheezing, rales or rhonchi. Respiratory effort normal. No accessory muscle use.  Abdomen: soft nontender, nondistended Musculoskeletal: : bandage is C/D/I on her left distal-medial forearm with persistent edema of the forearm and hand but minimal erythema Neuro: Cranial nervesgrossly  II-XII intact, strength, sensation, reflexes Psych: judgement and insight appear normal, stable mood and affect, mental status Skin: diffuse track marks    Data reviewed:  I have personally reviewed the recent labs and imaging studies  Pertinent Labs:   WBC 10.6 Hgb 10.6 Respiratory panel PCR negative Wound culture - Strep pyogenes HCG negative   Inpatient Medications:   Scheduled Meds: . naproxen  250 mg Oral  BID WC  . sodium chloride flush  3 mL Intravenous Once   Continuous Infusions: . piperacillin-tazobactam (ZOSYN)  IV 3.375 g (09/01/19 1356)  . vancomycin 750 mg (09/01/19 [id number])     Radiological Exams on Admission: DG Wrist Complete Left  Result Date: 08/30/2019 CLINICAL DATA:  Pt here from UC with reports of L wrist pain yesterday with sudden worsening to where she was unable to move thumb/wrist due to pain. Pt with some swelling to R thumb with hx of endocarditis that presented the same way several years ago. EXAM: LEFT WRIST - COMPLETE 3+ VIEW COMPARISON:  None. FINDINGS: No fracture or bone lesion. Joints are normally spaced and aligned.  No arthropathic changes. There is nonspecific soft tissue swelling that is most evident along the radial and volar aspect the wrist and distal forearm, extending into the hand. No soft tissue air. IMPRESSION: 1. No fracture, bone lesion or joint abnormality. 2. Nonspecific soft tissue swelling. Electronically Signed   By: Lajean Manes M.D.   On: 08/30/2019 14:45    Impression/Recommendations Principal Problem:   Cellulitis and abscess of hand Active Problems:   IV drug user   Polysubstance abuse ([place])   Chronic hepatitis C without hepatic coma (HCC)  Cellulitis and abscess of left wrist, hand -Patient with h/o IVDA, multiple associated prior infections -She had I&D performed on 3/25 but has ongoing edema of the wrist and hand -Not overly concerning for refractory infection at this time -She was seen by Dr. Megan Salon from ID and antibiotics tweaked -Will remain hospitalized for now  Opiate (and polysubstance) dependence -Long-standing heroin dependence -She recently started on Methadone and is noticing withdrawal symptoms -Will resume Methadone -Will monitor on COWS protocol -prn orders from the Clonidine withdrawal order set were also ordered. -Avoid other narcotics, if possible  Tobacco dependence -encourage cessation.     -Patch ordered        Thank you for this consultation.  Our [facility] hospitalist team will follow the patient with you.   Time Spent: 40 minutes  Karmen Bongo M.D. [facility] 09/01/2019, 2:21 PM

## 2019-09-02 ENCOUNTER — Inpatient Hospital Stay (HOSPITAL_COMMUNITY): Payer: Self-pay

## 2019-09-02 DIAGNOSIS — F1990 Other psychoactive substance use, unspecified, uncomplicated: Secondary | ICD-10-CM

## 2019-09-02 DIAGNOSIS — I8290 Acute embolism and thrombosis of unspecified vein: Secondary | ICD-10-CM

## 2019-09-02 DIAGNOSIS — L03114 Cellulitis of left upper limb: Secondary | ICD-10-CM

## 2019-09-02 DIAGNOSIS — Z8679 Personal history of other diseases of the circulatory system: Secondary | ICD-10-CM

## 2019-09-02 DIAGNOSIS — Z8739 Personal history of other diseases of the musculoskeletal system and connective tissue: Secondary | ICD-10-CM

## 2019-09-02 DIAGNOSIS — L02512 Cutaneous abscess of left hand: Secondary | ICD-10-CM

## 2019-09-02 DIAGNOSIS — Z8619 Personal history of other infectious and parasitic diseases: Secondary | ICD-10-CM

## 2019-09-02 DIAGNOSIS — M00232 Other streptococcal arthritis, left wrist: Secondary | ICD-10-CM

## 2019-09-02 DIAGNOSIS — B950 Streptococcus, group A, as the cause of diseases classified elsewhere: Secondary | ICD-10-CM

## 2019-09-02 LAB — BASIC METABOLIC PANEL
Anion gap: 10 (ref 5–15)
BUN: 10 mg/dL (ref 6–20)
CO2: 21 mmol/L — ABNORMAL LOW (ref 22–32)
Calcium: 8.3 mg/dL — ABNORMAL LOW (ref 8.9–10.3)
Chloride: 107 mmol/L (ref 98–111)
Creatinine, Ser: 0.57 mg/dL (ref 0.44–1.00)
GFR calc Af Amer: >60 mL/min (ref >60)
GFR calc non Af Amer: >60 mL/min (ref >60)
Glucose, Bld: 91 mg/dL (ref 70–99)
Potassium: 4.2 mmol/L (ref 3.5–5.1)
Sodium: 138 mmol/L (ref 135–145)

## 2019-09-02 MED ORDER — NAPROXEN 250 MG PO TABS
500.0000 mg | ORAL_TABLET | Freq: Two times a day (BID) | ORAL | Status: DC
  Administered 2019-09-02 – 2019-09-04 (×6): 500 mg via ORAL
  Filled 2019-09-02 (×7): qty 2

## 2019-09-02 MED ORDER — MORPHINE SULFATE (PF) 2 MG/ML IV SOLN
2.0000 mg | INTRAVENOUS | Status: DC | PRN
  Administered 2019-09-02 – 2019-09-03 (×4): 2 mg via INTRAVENOUS
  Filled 2019-09-02 (×5): qty 1

## 2019-09-02 MED ORDER — OXYCODONE HCL 5 MG PO TABS: 5.0000 mg | ORAL_TABLET | ORAL | Status: DC | PRN

## 2019-09-02 MED ORDER — OXYCODONE HCL 5 MG PO TABS
10.0000 mg | ORAL_TABLET | ORAL | Status: DC | PRN
  Administered 2019-09-02 – 2019-09-04 (×4): 10 mg via ORAL
  Filled 2019-09-02 (×4): qty 2

## 2019-09-02 MED ORDER — ACETAMINOPHEN 500 MG PO TABS
1000.0000 mg | ORAL_TABLET | Freq: Three times a day (TID) | ORAL | Status: DC
  Administered 2019-09-02 (×2): 1000 mg via ORAL
  Filled 2019-09-02 (×7): qty 2

## 2019-09-02 NOTE — Plan of Care (Signed)
?  Problem: Clinical Measurements: ?Goal: Ability to avoid or minimize complications of infection will improve ?Outcome: Progressing ?  ?Problem: Skin Integrity: ?Goal: Skin integrity will improve ?Outcome: Progressing ?  ?

## 2019-09-02 NOTE — Progress Notes (Signed)
MRI wrist and hand with and without contrast exams attempted. Patient experiencing pain and discomfort while in position with scanning equipment. Equipment and position readjusted where possible. Patient ultimately unable to complete exams. RN notified.

## 2019-09-02 NOTE — Progress Notes (Signed)
Consult NOTE    Sherri Francis  PJA:[id number] DOB: January 08, 1986 DOA: 08/30/2019 PCP: Patient, No Pcp Per   Brief Narrative:  Sherri Francis is an 34 y.o. female with h/o polysubstance abuse (cocaine, methamphetamine, heroin,tobacco); depression/anxiety; HCV; endocarditis; C-spine osteomyelitis; and PID presenting on 3/24 with wrist pain.  She reports having an infection associated with IVDA.  She had I&D and is continuing to have pain and swelling but it does feel better.  Minimally erythema.  No fevers.  She was recently started on Methadone and would like very much to resume the medication - she thinks some of her pain and nausea may be associated with opiate withdrawal.  Assessment & Plan:   Principal Problem:   Cellulitis and abscess of hand Active Problems:   IV drug user   Polysubstance abuse ([place])   Chronic hepatitis C without hepatic coma ([place])  Cellulitis and abscess of left wrist, hand -Patient with h/o IVDA, multiple associated prior infections -She had I&D performed on 3/25 but has ongoing edema of the wrist and hand -strep pyogenes on cx, follow final cx results -appreciate ID recs - continue zosyn and clindamycin, follow LUE [place] and MRI -Appreciate Dr. Lenon Curt recs - c/o significant pain, tearful from this, will schedule APAP, naproxen.  Oxycodone prn, morphine prn.  She's on methadone chronically.  -Will remain hospitalized for now  Opiate (and polysubstance) dependence -Long-standing heroin dependence -She recently started on Methadone and is noticing withdrawal symptoms -Continue methadone.  Tobacco dependence -encourage cessation.     -Patch ordered   DVT prophylaxis: SCD Code Status: full Family Communication: none at bedside Disposition Plan:  . Patient came from: pending            . Anticipated d/c place: pending . Barriers to d/c OR conditions which need to be met to effect Navie Lamoreaux safe d/c: pending further improvement, pending MRI, [place], clearance by ID and  ortho   Consultants:   ID  Orthopedics is primary service  Procedures:  I&D L wrist including deep fascia, arthrotomy with joint wrist radiocarpal joint aspiration 3/25  Antimicrobials:  Anti-infectives (From admission, onward)   Start     Dose/Rate Route Frequency Ordered Stop   09/01/19 1430  clindamycin (CLEOCIN) IVPB 900 mg     900 mg 100 mL/hr over 30 Minutes Intravenous Every 8 hours 09/01/19 1427     08/31/19 2000  piperacillin-tazobactam (ZOSYN) IVPB 3.375 g     3.375 g 12.5 mL/hr over 240 Minutes Intravenous Every 8 hours 08/31/19 1920     08/31/19 0900  vancomycin (VANCOREADY) IVPB 750 mg/150 mL  Status:  Discontinued     750 mg 150 mL/hr over 60 Minutes Intravenous Every 12 hours 08/30/19 2019 09/01/19 1427   08/30/19 2015  vancomycin (VANCOCIN) IVPB 1000 mg/200 mL premix     1,000 mg 200 mL/hr over 60 Minutes Intravenous  Once 08/30/19 2005 08/30/19 2132     Subjective: C/o L wrist pain  Objective: Vitals:   09/01/19 2109 09/02/19 0247 09/02/19 0831 09/02/19 1244  BP: 121/71 101/67 106/62 105/77  Pulse: 76 77 70 74  Resp:   18 18  Temp: 98.6 F (37 C) 98.4 F (36.9 C) 98.3 F (36.8 C) 99.1 F (37.3 C)  TempSrc: Oral Oral Oral Oral  SpO2: 100% 99% 98% 100%  Weight:      Height:        Intake/Output Summary (Last 24 hours) at 09/02/2019 1317 Last data filed at 09/02/2019 0900 Gross per 24 hour  Intake 523.65 ml  Output --  Net 523.65 ml   Filed Weights   08/30/19 1929  Weight: 49.9 kg    Examination:  General exam: Appears calm and comfortable  Respiratory system: Clear to auscultation. Respiratory effort normal. Cardiovascular system: S1 & S2 heard, RRR. Gastrointestinal system: Abdomen is nondistended, soft and nontender.  Central nervous system: Alert and oriented. No focal neurological deficits. Extremities: LUE swollen, erythematous, TTP - dressing in place Psychiatry: Judgement and insight appear normal. Mood & affect appropriate.    Data Reviewed: I have personally reviewed following labs and imaging studies  CBC: Recent Labs  Lab 08/30/19 1430 09/01/19 0516  WBC 10.1 10.6*  NEUTROABS 6.0 5.3  HGB 10.3* 10.6*  HCT 33.1* 33.0*  MCV 81.1 78.9*  PLT 332 431   Basic Metabolic Panel: Recent Labs  Lab 08/30/19 1430 09/02/19 1056  NA 137 138  K 3.9 4.2  CL 105 107  CO2 22 21*  GLUCOSE 90 91  BUN 8 10  CREATININE 0.57 0.57  CALCIUM 8.5* 8.3*   GFR: Estimated Creatinine Clearance: 76.2 mL/min (by C-G formula based on SCr of 0.57 mg/dL). Liver Function Tests: Recent Labs  Lab 08/30/19 1430  AST 17  ALT 33  ALKPHOS 71  BILITOT 0.4  PROT 6.9  ALBUMIN 3.4*   No results for input(s): LIPASE, AMYLASE in the last 168 hours. No results for input(s): AMMONIA in the last 168 hours. Coagulation Profile: No results for input(s): INR, PROTIME in the last 168 hours. Cardiac Enzymes: No results for input(s): CKTOTAL, CKMB, CKMBINDEX, TROPONINI in the last 168 hours. BNP (last 3 results) No results for input(s): PROBNP in the last 8760 hours. HbA1C: No results for input(s): HGBA1C in the last 72 hours. CBG: No results for input(s): GLUCAP in the last 168 hours. Lipid Profile: No results for input(s): CHOL, HDL, LDLCALC, TRIG, CHOLHDL, LDLDIRECT in the last 72 hours. Thyroid Function Tests: No results for input(s): TSH, T4TOTAL, FREET4, T3FREE, THYROIDAB in the last 72 hours. Anemia Panel: No results for input(s): VITAMINB12, FOLATE, FERRITIN, TIBC, IRON, RETICCTPCT in the last 72 hours. Sepsis Labs: Recent Labs  Lab 08/30/19 1430  LATICACIDVEN 0.6    Recent Results (from the past 240 hour(s))  Respiratory Panel by RT PCR (Flu Ceylon Arenson&B, Covid) - Nasopharyngeal Swab     Status: None   Collection Time: 08/30/19  7:45 PM   Specimen: Nasopharyngeal Swab  Result Value Ref Range Status   SARS Coronavirus 2 by RT PCR NEGATIVE NEGATIVE Final    Comment: (NOTE) SARS-CoV-2 target nucleic acids are NOT  DETECTED. The SARS-CoV-2 RNA is generally detectable in upper respiratoy specimens during the acute phase of infection. The lowest concentration of SARS-CoV-2 viral copies this assay can detect is 131 copies/mL. Tanis Hensarling negative result does not preclude SARS-Cov-2 infection and should not be used as the sole basis for treatment or other patient management decisions. Kameryn Tisdel negative result may occur with  improper specimen collection/handling, submission of specimen other than nasopharyngeal swab, presence of viral mutation(s) within the areas targeted by this assay, and inadequate number of viral copies (<131 copies/mL). Trisa Cranor negative result must be combined with clinical observations, patient history, and epidemiological information. The expected result is Negative. Fact Sheet for Patients:  PinkCheek.be Fact Sheet for Healthcare Providers:  GravelBags.it This test is not yet ap proved or cleared by the [place] FDA and  has been authorized for detection and/or diagnosis of SARS-CoV-2 by FDA under an Emergency Use Authorization (EUA). This  EUA will remain  in effect (meaning this test can be used) for the duration of the COVID-19 declaration under Section 564(b)(1) of the Act, 21 U.S.C. section 360bbb-3(b)(1), unless the authorization is terminated or revoked sooner.    Influenza Buell Parcel by PCR NEGATIVE NEGATIVE Final   Influenza B by PCR NEGATIVE NEGATIVE Final    Comment: (NOTE) The Xpert Xpress SARS-CoV-2/FLU/RSV assay is intended as an aid in  the diagnosis of influenza from Nasopharyngeal swab specimens and  should not be used as Varun Jourdan sole basis for treatment. Nasal washings and  aspirates are unacceptable for Xpert Xpress SARS-CoV-2/FLU/RSV  testing. Fact Sheet for Patients: PinkCheek.be Fact Sheet for Healthcare Providers: GravelBags.it This test is not yet approved or cleared  by the [place] FDA and  has been authorized for detection and/or diagnosis of SARS-CoV-2 by  FDA under an Emergency Use Authorization (EUA). This EUA will remain  in effect (meaning this test can be used) for the duration of the  Covid-19 declaration under Section 564(b)(1) of the Act, 21  U.S.C. section 360bbb-3(b)(1), unless the authorization is  terminated or revoked. Performed at [facility], [place] [address] [place], [place] [address]   Aerobic/Anaerobic Culture (surgical/deep wound)     Status: None (Preliminary result)   Collection Time: 08/30/19 10:08 PM   Specimen: Forearm, Left; Tissue  Result Value Ref Range Status   Specimen Description WOUND BLOOD LEFT FOREARM  Final   Special Requests DEEP  Final   Gram Stain   Final    FEW WBC PRESENT, PREDOMINANTLY PMN RARE GRAM POSITIVE COCCI    Culture   Final    FEW STREPTOCOCCUS PYOGENES NO ANAEROBES ISOLATED; CULTURE IN PROGRESS FOR 5 DAYS Beta hemolytic streptococci are predictably susceptible to penicillin and other beta lactams. Susceptibility testing not routinely performed. Performed at [facility], [place] [address], [place], [place] [address]    Report Status PENDING  Incomplete         Radiology Studies: No results found.      Scheduled Meds: . acetaminophen  1,000 mg Oral Q8H  . methadone  35 mg Oral Daily  . naproxen  500 mg Oral BID WC  . nicotine  7 mg Transdermal Daily  . sodium chloride flush  3 mL Intravenous Once   Continuous Infusions: . clindamycin (CLEOCIN) IV 900 mg (09/02/19 0548)  . piperacillin-tazobactam (ZOSYN)  IV 3.375 g (09/02/19 0548)     LOS: 1 day    Time spent: over 20 min    Fayrene Helper, MD [facility]   To contact the attending provider between 7A-7P or the covering provider during after hours 7P-7A, please log into the web site [email or web address] and access using universal [place] password for that web site. If you do not have the password,  please call the hospital operator.  09/02/2019, 1:17 PM

## 2019-09-02 NOTE — Progress Notes (Signed)
VASCULAR LAB PRELIMINARY  PRELIMINARY  PRELIMINARY  PRELIMINARY  Left upper extremity venous duplex completed.    Preliminary report:  See CV proc for preliminary results.   Itzae Mccurdy, RVT 09/02/2019, 2:05 PM

## 2019-09-02 NOTE — Progress Notes (Signed)
Subjective:  Complaining of pain and swelling in her forearm wrist and her hand  Antibiotics:  Anti-infectives (From admission, onward)   Start     Dose/Rate Route Frequency Ordered Stop   09/01/19 1430  clindamycin (CLEOCIN) IVPB 900 mg     900 mg 100 mL/hr over 30 Minutes Intravenous Every 8 hours 09/01/19 1427     08/31/19 2000  piperacillin-tazobactam (ZOSYN) IVPB 3.375 g     3.375 g 12.5 mL/hr over 240 Minutes Intravenous Every 8 hours 08/31/19 1920     08/31/19 0900  vancomycin (VANCOREADY) IVPB 750 mg/150 mL  Status:  Discontinued     750 mg 150 mL/hr over 60 Minutes Intravenous Every 12 hours 08/30/19 2019 09/01/19 1427   08/30/19 2015  vancomycin (VANCOCIN) IVPB 1000 mg/200 mL premix     1,000 mg 200 mL/hr over 60 Minutes Intravenous  Once 08/30/19 2005 08/30/19 2132      Medications: Scheduled Meds: . methadone  35 mg Oral Daily  . nicotine  7 mg Transdermal Daily  . sodium chloride flush  3 mL Intravenous Once   Continuous Infusions: . clindamycin (CLEOCIN) IV 900 mg (09/02/19 0548)  . piperacillin-tazobactam (ZOSYN)  IV 3.375 g (09/02/19 0548)   PRN Meds:.dicyclomine, hydrOXYzine, loperamide, methocarbamol, morphine injection, naproxen, ondansetron    Objective: Weight change:   Intake/Output Summary (Last 24 hours) at 09/02/2019 1049 Last data filed at 09/02/2019 0900 Gross per 24 hour  Intake 523.65 ml  Output --  Net 523.65 ml   Blood pressure 106/62, pulse 70, temperature 98.3 F (36.8 C), temperature source Oral, resp. rate 18, height 5\' 1"  (1.549 m), weight 49.9 kg, last menstrual period 08/17/2019, SpO2 98 %. Temp:  [98.3 F (36.8 C)-98.6 F (37 C)] 98.3 F (36.8 C) (03/27 0831) Pulse Rate:  [70-77] 70 (03/27 0831) Resp:  [18] 18 (03/27 0831) BP: (101-121)/(62-71) 106/62 (03/27 0831) SpO2:  [98 %-100 %] 98 % (03/27 0831)  Physical Exam: General: Alert and awake, oriented x3, not in any acute distress. HEENT: anicteric sclera,  EOMI CVS regular rate, normal  Chest: , no wheezing, no respiratory distress Abdomen: soft non-distended,  Extremities: Left forearm is edematous she has special tenderness in the palm of her hand which is also quite edematous Skin: Tattoos Neuro: nonfocal  CBC:    BMET Recent Labs    08/30/19 1430  NA 137  K 3.9  CL 105  CO2 22  GLUCOSE 90  BUN 8  CREATININE 0.57  CALCIUM 8.5*     Liver Panel  Recent Labs    08/30/19 1430  PROT 6.9  ALBUMIN 3.4*  AST 17  ALT 33  ALKPHOS 71  BILITOT 0.4       Sedimentation Rate No results for input(s): ESRSEDRATE in the last 72 hours. C-Reactive Protein No results for input(s): CRP in the last 72 hours.  Micro Results: Recent Results (from the past 720 hour(s))  Respiratory Panel by RT PCR (Flu A&B, Covid) - Nasopharyngeal Swab     Status: None   Collection Time: 08/30/19  7:45 PM   Specimen: Nasopharyngeal Swab  Result Value Ref Range Status   SARS Coronavirus 2 by RT PCR NEGATIVE NEGATIVE Final    Comment: (NOTE) SARS-CoV-2 target nucleic acids are NOT DETECTED. The SARS-CoV-2 RNA is generally detectable in upper respiratoy specimens during the acute phase of infection. The lowest concentration of SARS-CoV-2 viral copies this assay can detect is 131 copies/mL. A negative result  does not preclude SARS-Cov-2 infection and should not be used as the sole basis for treatment or other patient management decisions. A negative result may occur with  improper specimen collection/handling, submission of specimen other than nasopharyngeal swab, presence of viral mutation(s) within the areas targeted by this assay, and inadequate number of viral copies (<131 copies/mL). A negative result must be combined with clinical observations, patient history, and epidemiological information. The expected result is Negative. Fact Sheet for Patients:  PinkCheek.be Fact Sheet for Healthcare Providers:    GravelBags.it This test is not yet ap proved or cleared by the [place] FDA and  has been authorized for detection and/or diagnosis of SARS-CoV-2 by FDA under an Emergency Use Authorization (EUA). This EUA will remain  in effect (meaning this test can be used) for the duration of the COVID-19 declaration under Section 564(b)(1) of the Act, 21 U.S.C. section 360bbb-3(b)(1), unless the authorization is terminated or revoked sooner.    Influenza A by PCR NEGATIVE NEGATIVE Final   Influenza B by PCR NEGATIVE NEGATIVE Final    Comment: (NOTE) The Xpert Xpress SARS-CoV-2/FLU/RSV assay is intended as an aid in  the diagnosis of influenza from Nasopharyngeal swab specimens and  should not be used as a sole basis for treatment. Nasal washings and  aspirates are unacceptable for Xpert Xpress SARS-CoV-2/FLU/RSV  testing. Fact Sheet for Patients: PinkCheek.be Fact Sheet for Healthcare Providers: GravelBags.it This test is not yet approved or cleared by the [place] FDA and  has been authorized for detection and/or diagnosis of SARS-CoV-2 by  FDA under an Emergency Use Authorization (EUA). This EUA will remain  in effect (meaning this test can be used) for the duration of the  Covid-19 declaration under Section 564(b)(1) of the Act, 21  U.S.C. section 360bbb-3(b)(1), unless the authorization is  terminated or revoked. Performed at [facility], [place] [address] [place], [place] [address]   Aerobic/Anaerobic Culture (surgical/deep wound)     Status: None (Preliminary result)   Collection Time: 08/30/19 10:08 PM   Specimen: Forearm, Left; Tissue  Result Value Ref Range Status   Specimen Description WOUND BLOOD LEFT FOREARM  Final   Special Requests DEEP  Final   Gram Stain   Final    FEW WBC PRESENT, PREDOMINANTLY PMN RARE GRAM POSITIVE COCCI Performed at [facility], [place] [address] [place], [place] [address]    Culture   Final    FEW STREPTOCOCCUS PYOGENES NO ANAEROBES ISOLATED; CULTURE IN PROGRESS FOR 5 DAYS    Report Status PENDING  Incomplete    Studies/Results: No results found.    Assessment/Plan:  INTERVAL HISTORY: She continues to have a significant amount of edema in her upper extremity and pain in   Principal Problem:   Cellulitis and abscess of hand Active Problems:   IV drug user   Polysubstance abuse ([place])   Chronic hepatitis C without hepatic coma ([facility])    Sherri Francis is a 34 y.o. 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  She is status post I&D where nonpurulent fluid was taken and sent for culture now growing group A strep.  She on quite broad coverage with Zosyn and also clindamycin for inhibition of ribosomal production of toxins.  Her left forearm is quite edematous and her hand is edematous and quite tender to palpation  #1 Septic left wrist:  GAS on  culture. I agree with covering not just it but anerobes. Could accomplish with unasyn instead of zosyn but will leave this for now and also continue clindamycin for toxin inhibition  I agree with Dr Lenon Curt concern for septic thrombophlebitis and getting a Doppler  I will also order an MRI of her wrist and hand because of the significant amount of tenderness in the hand periods may build to be due to edema but I like to make sure there is no evidence of undrained pockets of pus  2.  IV drug use: on methadone  LOS: 1 day   Alcide Evener 09/02/2019, 10:49 AM

## 2019-09-03 ENCOUNTER — Inpatient Hospital Stay (HOSPITAL_COMMUNITY): Payer: Self-pay

## 2019-09-03 MED ORDER — SODIUM CHLORIDE 0.9 % IV SOLN
3.0000 g | Freq: Three times a day (TID) | INTRAVENOUS | Status: DC
  Administered 2019-09-03 – 2019-09-04 (×3): 3 g via INTRAVENOUS
  Filled 2019-09-03 (×2): qty 8
  Filled 2019-09-03: qty 3
  Filled 2019-09-03 (×3): qty 8

## 2019-09-03 NOTE — Plan of Care (Signed)
  Problem: Skin Integrity: Goal: Skin integrity will improve Outcome: Progressing   Problem: Education: Goal: Knowledge of General Education information will improve Description: Including pain rating scale, medication(s)/side effects and non-pharmacologic comfort measures Outcome: Progressing   Problem: Activity: Goal: Risk for activity intolerance will decrease Outcome: Progressing   Problem: Nutrition: Goal: Adequate nutrition will be maintained Outcome: Progressing

## 2019-09-03 NOTE — Plan of Care (Signed)
  Problem: Clinical Measurements: Goal: Ability to avoid or minimize complications of infection will improve Outcome: Progressing   Problem: Skin Integrity: Goal: Skin integrity will improve Outcome: Progressing   Problem: Activity: Goal: Risk for activity intolerance will decrease Outcome: Progressing   Problem: Nutrition: Goal: Adequate nutrition will be maintained Outcome: Progressing   

## 2019-09-03 NOTE — Progress Notes (Signed)
Subjective:  She feels less pain and swelling in her left arm today Antibiotics:  Anti-infectives (From admission, onward)   Start     Dose/Rate Route Frequency Ordered Stop   09/03/19 1400  Ampicillin-Sulbactam (UNASYN) 3 g in sodium chloride 0.9 % 100 mL IVPB     3 g 200 mL/hr over 30 Minutes Intravenous Every 8 hours 09/03/19 1015     09/01/19 1430  clindamycin (CLEOCIN) IVPB 900 mg     900 mg 100 mL/hr over 30 Minutes Intravenous Every 8 hours 09/01/19 1427 09/04/19 0559   08/31/19 2000  piperacillin-tazobactam (ZOSYN) IVPB 3.375 g  Status:  Discontinued     3.375 g 12.5 mL/hr over 240 Minutes Intravenous Every 8 hours 08/31/19 1920 09/03/19 1015   08/31/19 0900  vancomycin (VANCOREADY) IVPB 750 mg/150 mL  Status:  Discontinued     750 mg 150 mL/hr over 60 Minutes Intravenous Every 12 hours 08/30/19 2019 09/01/19 1427   08/30/19 2015  vancomycin (VANCOCIN) IVPB 1000 mg/200 mL premix     1,000 mg 200 mL/hr over 60 Minutes Intravenous  Once 08/30/19 2005 08/30/19 2132      Medications: Scheduled Meds: . acetaminophen  1,000 mg Oral Q8H  . methadone  35 mg Oral Daily  . naproxen  500 mg Oral BID WC  . nicotine  7 mg Transdermal Daily  . sodium chloride flush  3 mL Intravenous Once   Continuous Infusions: . ampicillin-sulbactam (UNASYN) IV Stopped (09/03/19 1433)  . clindamycin (CLEOCIN) IV Stopped (09/03/19 1344)   PRN Meds:.dicyclomine, hydrOXYzine, loperamide, methocarbamol, morphine injection, ondansetron, oxyCODONE **OR** oxyCODONE    Objective: Weight change:   Intake/Output Summary (Last 24 hours) at 09/03/2019 1609 Last data filed at 09/03/2019 1500 Gross per 24 hour  Intake 1090 ml  Output 1 ml  Net 1089 ml   Blood pressure 120/66, pulse 79, temperature 99.7 F (37.6 C), temperature source Oral, resp. rate 18, height 5\' 1"  (1.549 m), weight 49.9 kg, last menstrual period 08/17/2019, SpO2 99 %. Temp:  [98.4 F (36.9 C)-99.7 F (37.6 C)] 99.7  F (37.6 C) (03/28 1230) Pulse Rate:  [76-80] 79 (03/28 1230) Resp:  [18] 18 (03/28 1230) BP: (110-126)/(54-84) 120/66 (03/28 1230) SpO2:  [99 %] 99 % (03/28 1230)  Physical Exam: General: Alert and awake, oriented x3, not in any acute distress. HEENT: anicteric sclera, EOMI CVS regular rate, normal  Chest: , no wheezing, no respiratory distress Abdomen: soft non-distended,  Extremities:  Left forearm today September 03, 2019:        Skin: Tattoos Neuro: nonfocal  CBC:    BMET Recent Labs    09/02/19 1056  NA 138  K 4.2  CL 107  CO2 21*  GLUCOSE 91  BUN 10  CREATININE 0.57  CALCIUM 8.3*     Liver Panel  No results for input(s): PROT, ALBUMIN, AST, ALT, ALKPHOS, BILITOT, BILIDIR, IBILI in the last 72 hours.     Sedimentation Rate No results for input(s): ESRSEDRATE in the last 72 hours. C-Reactive Protein No results for input(s): CRP in the last 72 hours.  Micro Results: Recent Results (from the past 720 hour(s))  Respiratory Panel by RT PCR (Flu A&B, Covid) - Nasopharyngeal Swab     Status: None   Collection Time: 08/30/19  7:45 PM   Specimen: Nasopharyngeal Swab  Result Value Ref Range Status   SARS Coronavirus 2 by RT PCR NEGATIVE NEGATIVE Final    Comment: (NOTE) SARS-CoV-2  target nucleic acids are NOT DETECTED. The SARS-CoV-2 RNA is generally detectable in upper respiratoy specimens during the acute phase of infection. The lowest concentration of SARS-CoV-2 viral copies this assay can detect is 131 copies/mL. A negative result does not preclude SARS-Cov-2 infection and should not be used as the sole basis for treatment or other patient management decisions. A negative result may occur with  improper specimen collection/handling, submission of specimen other than nasopharyngeal swab, presence of viral mutation(s) within the areas targeted by this assay, and inadequate number of viral copies (<131 copies/mL). A negative result must be combined  with clinical observations, patient history, and epidemiological information. The expected result is Negative. Fact Sheet for Patients:  PinkCheek.be Fact Sheet for Healthcare Providers:  GravelBags.it This test is not yet ap proved or cleared by the [place] FDA and  has been authorized for detection and/or diagnosis of SARS-CoV-2 by FDA under an Emergency Use Authorization (EUA). This EUA will remain  in effect (meaning this test can be used) for the duration of the COVID-19 declaration under Section 564(b)(1) of the Act, 21 U.S.C. section 360bbb-3(b)(1), unless the authorization is terminated or revoked sooner.    Influenza A by PCR NEGATIVE NEGATIVE Final   Influenza B by PCR NEGATIVE NEGATIVE Final    Comment: (NOTE) The Xpert Xpress SARS-CoV-2/FLU/RSV assay is intended as an aid in  the diagnosis of influenza from Nasopharyngeal swab specimens and  should not be used as a sole basis for treatment. Nasal washings and  aspirates are unacceptable for Xpert Xpress SARS-CoV-2/FLU/RSV  testing. Fact Sheet for Patients: PinkCheek.be Fact Sheet for Healthcare Providers: GravelBags.it This test is not yet approved or cleared by the [place] FDA and  has been authorized for detection and/or diagnosis of SARS-CoV-2 by  FDA under an Emergency Use Authorization (EUA). This EUA will remain  in effect (meaning this test can be used) for the duration of the  Covid-19 declaration under Section 564(b)(1) of the Act, 21  U.S.C. section 360bbb-3(b)(1), unless the authorization is  terminated or revoked. Performed at [facility], [place] [address], [place], [place] [address]   Aerobic/Anaerobic Culture (surgical/deep wound)     Status: None (Preliminary result)   Collection Time: 08/30/19 10:08 PM   Specimen: Forearm, Left; Tissue  Result Value Ref Range Status    Specimen Description WOUND BLOOD LEFT FOREARM  Final   Special Requests DEEP  Final   Gram Stain   Final    FEW WBC PRESENT, PREDOMINANTLY PMN RARE GRAM POSITIVE COCCI    Culture   Final    FEW STREPTOCOCCUS PYOGENES NO ANAEROBES ISOLATED; CULTURE IN PROGRESS FOR 5 DAYS Beta hemolytic streptococci are predictably susceptible to penicillin and other beta lactams. Susceptibility testing not routinely performed. Performed at [facility], [place] [address], [place], [place] [address]    Report Status PENDING  Incomplete    Studies/Results: VAS [place] UPPER EXTREMITY VENOUS DUPLEX  Result Date: 09/02/2019 UPPER VENOUS STUDY  Indications: Pain, Swelling, and IV drug user, infection Comparison Study: No prior study on file. Performing Technologist: Sharion Dove RVS  Examination Guidelines: A complete evaluation includes B-mode imaging, spectral Doppler, color Doppler, and power Doppler as needed of all accessible portions of each vessel. Bilateral testing is considered an integral part of a complete examination. Limited examinations for reoccurring indications may be performed as noted.  Left Findings: +----------+------------+---------+-----------+----------+-------+ LEFT      CompressiblePhasicitySpontaneousPropertiesSummary +----------+------------+---------+-----------+----------+-------+ IJV  Full       Yes       Yes                      +----------+------------+---------+-----------+----------+-------+ Subclavian    Full       Yes       Yes                      +----------+------------+---------+-----------+----------+-------+ Axillary      Full       Yes       Yes                      +----------+------------+---------+-----------+----------+-------+ Brachial      Full       Yes       Yes                      +----------+------------+---------+-----------+----------+-------+ Cephalic      Full                                           +----------+------------+---------+-----------+----------+-------+ Basilic       Full                                          +----------+------------+---------+-----------+----------+-------+  Summary:  Left: No evidence of deep vein thrombosis in the upper extremity. No evidence of superficial vein thrombosis in the upper extremity.  *See table(s) above for measurements and observations.  Diagnosing physician: Deitra Mayo MD Electronically signed by Deitra Mayo MD on 09/02/2019 at 2:35:16 PM.    Final       Assessment/Plan:  INTERVAL HISTORY: She continues to have a significant amount of edema in her upper extremity and pain in   Principal Problem:   Cellulitis and abscess of hand Active Problems:   IV drug user   Polysubstance abuse ([place])   Chronic hepatitis C without hepatic coma (HCC)    Sherri Francis is a 34 y.o. 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  She is status post I&D where nonpurulent fluid was taken and sent for culture now growing group A strep.  She on quite broad coverage with Zosyn and also clindamycin for inhibition of ribosomal production of toxins.  Her left forearm is quite edematous and her hand is edematous and quite tender to palpation  #1 Septic left wrist: There is my understanding that she had cultures from deep in the joint sent that grew the group A strep?  Dopplers did not show DVT  She was not able to have an MRI late yesterday.  We will stop clindamycin as we are 3 days into using it and I would like to narrow from Zosyn to Unasyn which will give [place] good coverage for the group A strep plus anaerobes.    2.  IV drug use: on methadone  Dr. Megan Salon will be back tomorrow  LOS: 2 days   Alcide Evener 09/03/2019, 4:09 PM

## 2019-09-03 NOTE — Progress Notes (Signed)
Consult NOTE    Sherri Francis  PJA:[id number] DOB: Nov 05, 1985 DOA: 08/30/2019 PCP: Patient, No Pcp Per   Brief Narrative:  Sherri Francis is an 34 y.o. female with h/o polysubstance abuse (cocaine, methamphetamine, heroin,tobacco); depression/anxiety; HCV; endocarditis; C-spine osteomyelitis; and PID presenting on 3/24 with wrist pain.  She reports having an infection associated with IVDA.  She had I&D and is continuing to have pain and swelling but it does feel better.  Minimally erythema.  No fevers.  She was recently started on Methadone and would like very much to resume the medication - she thinks some of her pain and nausea may be associated with opiate withdrawal.  Assessment & Plan:   Principal Problem:   Cellulitis and abscess of hand Active Problems:   IV drug user   Polysubstance abuse ([place])   Chronic hepatitis C without hepatic coma ([place])  Cellulitis and abscess of left wrist, hand -Patient with h/o IVDA, multiple associated prior infections - I&D L wrist including deep fascia, arthrotomy with joint wrist radiocarpal joint aspiration -> septic arthritis? Will need to discuss with Dr. Lenon Curt.  -strep pyogenes on cx, follow final cx results  -appreciate ID recs - continue zosyn and clindamycin, follow LUE [place] and MRI -Appreciate Dr. Lenon Curt recs - c/o significant pain.  schedule APAP, naproxen.  Oxycodone prn, morphine prn.  She's on methadone chronically.  -Will remain hospitalized for now  Opiate (and polysubstance) dependence -Long-standing heroin dependence -She recently started on Methadone and is noticing withdrawal symptoms -Continue methadone.  Tobacco dependence -encourage cessation.     -Patch ordered   Diarrhea: continue to monitor, mild at this time  DVT prophylaxis: SCD Code Status: full Family Communication: none at bedside Disposition Plan:  . Patient came from: pending            . Anticipated d/c place: pending . Barriers to d/c OR conditions which  need to be met to effect [name] safe d/c: pending further improvement, pending MRI, [place], clearance by ID and ortho   Consultants:   ID  Orthopedics is primary service  Procedures:  I&D L wrist including deep fascia, arthrotomy with joint wrist radiocarpal joint aspiration 3/25  Antimicrobials:  Anti-infectives (From admission, onward)   Start     Dose/Rate Route Frequency Ordered Stop   09/03/19 1400  Ampicillin-Sulbactam (UNASYN) 3 g in sodium chloride 0.9 % 100 mL IVPB     3 g 200 mL/hr over 30 Minutes Intravenous Every 8 hours 09/03/19 1015     09/01/19 1430  clindamycin (CLEOCIN) IVPB 900 mg     900 mg 100 mL/hr over 30 Minutes Intravenous Every 8 hours 09/01/19 1427 09/04/19 0559   08/31/19 2000  piperacillin-tazobactam (ZOSYN) IVPB 3.375 g  Status:  Discontinued     3.375 g 12.5 mL/hr over 240 Minutes Intravenous Every 8 hours 08/31/19 1920 09/03/19 1015   08/31/19 0900  vancomycin (VANCOREADY) IVPB 750 mg/150 mL  Status:  Discontinued     750 mg 150 mL/hr over 60 Minutes Intravenous Every 12 hours 08/30/19 2019 09/01/19 1427   08/30/19 2015  vancomycin (VANCOCIN) IVPB 1000 mg/200 mL premix     1,000 mg 200 mL/hr over 60 Minutes Intravenous  Once 08/30/19 2005 08/30/19 2132     Subjective: L wrist with improved swelling Notes nausea, diarrhea  Objective: Vitals:   09/02/19 1244 09/02/19 1958 09/03/19 0933 09/03/19 1230  BP: 105/77 (!) 110/54 126/84 120/66  Pulse: 74 76 80 79  Resp: _0 18  Temp: 99.1 F (37.3 C) 98.4 F (36.9 C) 98.7 F (37.1 C) 99.7 F (37.6 C)  TempSrc: Oral Oral Oral Oral  SpO2: 100% 99% 99% 99%  Weight:      Height:        Intake/Output Summary (Last 24 hours) at 09/03/2019 1445 Last data filed at 09/03/2019 1313 Gross per 24 hour  Intake 560 ml  Output 1 ml  Net 559 ml   Filed Weights   08/30/19 1929  Weight: 49.9 kg    Examination:  General: No acute distress. Cardiovascular: Heart sounds show Sherri Francis regular rate, and  rhythm Lungs: Clear to auscultation bilaterally  Abdomen: Soft, nontender, nondistended  Neurological: Alert and oriented 3. Moves all extremities 4. Cranial nerves II through XII grossly intact. Skin: Warm and dry. No rashes or lesions. Extremities: LUE with improved swelling   Data Reviewed: I have personally reviewed following labs and imaging studies  CBC: Recent Labs  Lab 08/30/19 1430 09/01/19 0516  WBC 10.1 10.6*  NEUTROABS 6.0 5.3  HGB 10.3* 10.6*  HCT 33.1* 33.0*  MCV 81.1 78.9*  PLT 332 643   Basic Metabolic Panel: Recent Labs  Lab 08/30/19 1430 09/02/19 1056  NA 137 138  K 3.9 4.2  CL 105 107  CO2 22 21*  GLUCOSE 90 91  BUN 8 10  CREATININE 0.57 0.57  CALCIUM 8.5* 8.3*   GFR: Estimated Creatinine Clearance: 76.2 mL/min (by C-G formula based on SCr of 0.57 mg/dL). Liver Function Tests: Recent Labs  Lab 08/30/19 1430  AST 17  ALT 33  ALKPHOS 71  BILITOT 0.4  PROT 6.9  ALBUMIN 3.4*   No results for input(s): LIPASE, AMYLASE in the last 168 hours. No results for input(s): AMMONIA in the last 168 hours. Coagulation Profile: No results for input(s): INR, PROTIME in the last 168 hours. Cardiac Enzymes: No results for input(s): CKTOTAL, CKMB, CKMBINDEX, TROPONINI in the last 168 hours. BNP (last 3 results) No results for input(s): PROBNP in the last 8760 hours. HbA1C: No results for input(s): HGBA1C in the last 72 hours. CBG: No results for input(s): GLUCAP in the last 168 hours. Lipid Profile: No results for input(s): CHOL, HDL, LDLCALC, TRIG, CHOLHDL, LDLDIRECT in the last 72 hours. Thyroid Function Tests: No results for input(s): TSH, T4TOTAL, FREET4, T3FREE, THYROIDAB in the last 72 hours. Anemia Panel: No results for input(s): VITAMINB12, FOLATE, FERRITIN, TIBC, IRON, RETICCTPCT in the last 72 hours. Sepsis Labs: Recent Labs  Lab 08/30/19 1430  LATICACIDVEN 0.6    Recent Results (from the past 240 hour(s))  Respiratory Panel by RT  PCR (Flu [name]&B, Covid) - Nasopharyngeal Swab     Status: None   Collection Time: 08/30/19  7:45 PM   Specimen: Nasopharyngeal Swab  Result Value Ref Range Status   SARS Coronavirus 2 by RT PCR NEGATIVE NEGATIVE Final    Comment: (NOTE) SARS-CoV-2 target nucleic acids are NOT DETECTED. The SARS-CoV-2 RNA is generally detectable in upper respiratoy specimens during the acute phase of infection. The lowest concentration of SARS-CoV-2 viral copies this assay can detect is 131 copies/mL. Sherri Francis negative result does not preclude SARS-Cov-2 infection and should not be used as the sole basis for treatment or other patient management decisions. Sherri Francis negative result may occur with  improper specimen collection/handling, submission of specimen other than nasopharyngeal swab, presence of viral mutation(s) within the areas targeted by this assay, and inadequate number of viral copies (<131 copies/mL). Sherri Francis negative result must be combined with clinical observations,  patient history, and epidemiological information. The expected result is Negative. Fact Sheet for Patients:  PinkCheek.be Fact Sheet for Healthcare Providers:  GravelBags.it This test is not yet ap proved or cleared by the [place] FDA and  has been authorized for detection and/or diagnosis of SARS-CoV-2 by FDA under an Emergency Use Authorization (EUA). This EUA will remain  in effect (meaning this test can be used) for the duration of the COVID-19 declaration under Section 564(b)(1) of the Act, 21 U.S.C. section 360bbb-3(b)(1), unless the authorization is terminated or revoked sooner.    Influenza Yenesis Even by PCR NEGATIVE NEGATIVE Final   Influenza B by PCR NEGATIVE NEGATIVE Final    Comment: (NOTE) The Xpert Xpress SARS-CoV-2/FLU/RSV assay is intended as an aid in  the diagnosis of influenza from Nasopharyngeal swab specimens and  should not be used as Sherri Francis sole basis for treatment. Nasal  washings and  aspirates are unacceptable for Xpert Xpress SARS-CoV-2/FLU/RSV  testing. Fact Sheet for Patients: PinkCheek.be Fact Sheet for Healthcare Providers: GravelBags.it This test is not yet approved or cleared by the [place] FDA and  has been authorized for detection and/or diagnosis of SARS-CoV-2 by  FDA under an Emergency Use Authorization (EUA). This EUA will remain  in effect (meaning this test can be used) for the duration of the  Covid-19 declaration under Section 564(b)(1) of the Act, 21  U.S.C. section 360bbb-3(b)(1), unless the authorization is  terminated or revoked. Performed at [facility], [place] [address] [place], [place] [address]   Aerobic/Anaerobic Culture (surgical/deep wound)     Status: None (Preliminary result)   Collection Time: 08/30/19 10:08 PM   Specimen: Forearm, Left; Tissue  Result Value Ref Range Status   Specimen Description WOUND BLOOD LEFT FOREARM  Final   Special Requests DEEP  Final   Gram Stain   Final    FEW WBC PRESENT, PREDOMINANTLY PMN RARE GRAM POSITIVE COCCI    Culture   Final    FEW STREPTOCOCCUS PYOGENES NO ANAEROBES ISOLATED; CULTURE IN PROGRESS FOR 5 DAYS Beta hemolytic streptococci are predictably susceptible to penicillin and other beta lactams. Susceptibility testing not routinely performed. Performed at [facility], [place] [address], [place], [place] [address]    Report Status PENDING  Incomplete         Radiology Studies: VAS [place] UPPER EXTREMITY VENOUS DUPLEX  Result Date: 09/02/2019 UPPER VENOUS STUDY  Indications: Pain, Swelling, and IV drug user, infection Comparison Study: No prior study on file. Performing Technologist: Sherri Francis RVS  Examination Guidelines: Sherri Francis complete evaluation includes B-mode imaging, spectral Doppler, color Doppler, and power Doppler as needed of all accessible portions of each vessel. Bilateral testing is  considered an integral part of Sherri Francis complete examination. Limited examinations for reoccurring indications may be performed as noted.  Left Findings: +----------+------------+---------+-----------+----------+-------+ LEFT      CompressiblePhasicitySpontaneousPropertiesSummary +----------+------------+---------+-----------+----------+-------+ IJV           Full       Yes       Yes                      +----------+------------+---------+-----------+----------+-------+ Subclavian    Full       Yes       Yes                      +----------+------------+---------+-----------+----------+-------+ Axillary      Full       Yes  Yes                      +----------+------------+---------+-----------+----------+-------+ Brachial      Full       Yes       Yes                      +----------+------------+---------+-----------+----------+-------+ Cephalic      Full                                          +----------+------------+---------+-----------+----------+-------+ Basilic       Full                                          +----------+------------+---------+-----------+----------+-------+  Summary:  Left: No evidence of deep vein thrombosis in the upper extremity. No evidence of superficial vein thrombosis in the upper extremity.  *See table(s) above for measurements and observations.  Diagnosing physician: Deitra Mayo MD Electronically signed by Deitra Mayo MD on 09/02/2019 at 2:35:16 PM.    Final         Scheduled Meds: . acetaminophen  1,000 mg Oral Q8H  . methadone  35 mg Oral Daily  . naproxen  500 mg Oral BID WC  . nicotine  7 mg Transdermal Daily  . sodium chloride flush  3 mL Intravenous Once   Continuous Infusions: . ampicillin-sulbactam (UNASYN) IV 3 g (09/03/19 1403)  . clindamycin (CLEOCIN) IV 900 mg (09/03/19 1313)     LOS: 2 days    Time spent: over 30 min    Fayrene Helper, MD [facility]   To contact  the attending provider between 7A-7P or the covering provider during after hours 7P-7A, please log into the web site [email or web address] and access using universal [place] password for that web site. If you do not have the password, please call the hospital operator.  09/03/2019, 2:45 PM

## 2019-09-03 NOTE — Progress Notes (Signed)
S:  Appreciate ID/Hospitalist consults.  Pt unable to tolerate arm position for MRI yesterday, another attempt today.  Pt states hand/arm feel better. Mostly tender thenar area  O:Blood pressure 126/84, pulse 80, temperature 98.7 F (37.1 C), temperature source Oral, resp. rate 18, height 5\' 1"  (1.549 m), weight 49.9 kg, last menstrual period 08/17/2019, SpO2 99 %.  Forearm less warm and less swollen, incision site with minimal erythema/no drainage  No fluctuance in thenar area or palm of hand, no cmcj tenderness, no wrist joint tenderness [place] negative for thrombosis  A:s/p I&D with continued cellulitis - improved   P: cont with abx per ID, will re-attempt MRI today.

## 2019-09-03 NOTE — Progress Notes (Signed)
Attempted Mri on left hand and wrist, pt had meds before coming to MRI. I was able to get 2 images of the hand pt refused to continue study.

## 2019-09-04 DIAGNOSIS — M609 Myositis, unspecified: Secondary | ICD-10-CM

## 2019-09-04 DIAGNOSIS — M659 Synovitis and tenosynovitis, unspecified: Secondary | ICD-10-CM

## 2019-09-04 MED ORDER — ORITAVANCIN DIPHOSPHATE 400 MG IV SOLR
1200.0000 mg | Freq: Once | INTRAVENOUS | Status: AC
  Administered 2019-09-04: 1200 mg via INTRAVENOUS
  Filled 2019-09-04: qty 120

## 2019-09-04 MED ORDER — NAPROXEN 500 MG PO TABS: 500.0000 mg | ORAL_TABLET | Freq: Two times a day (BID) | ORAL | 0 refills | Status: AC

## 2019-09-04 MED ORDER — AMOXICILLIN 500 MG PO TABS: 500.0000 mg | ORAL_TABLET | Freq: Three times a day (TID) | ORAL | 0 refills | Status: AC

## 2019-09-04 MED FILL — AMOXICILLIN 500 MG CAPS: 500 | 21 days supply | Qty: 63 | Fill #0

## 2019-09-04 NOTE — Discharge Summary (Signed)
Physician Discharge Summary  Sherri Francis [id number] DOB: Nov 10, 1985 DOA: 08/30/2019  PCP: Patient, No Pcp Per  Admit date: 08/30/2019 Discharge date: 09/04/2019  Time spent: 40 minutes  Recommendations for Outpatient Follow-up:  1. Follow outpatient CBC/CMP 2. Given oritavancin and then discharged with amoxicillin x 3 weeks  3. Follow outpatient with hand surgery  Discharge Diagnoses:  Principal Problem:   Cellulitis and abscess of hand Active Problems:   IV drug user   Polysubstance abuse ([place])   Chronic hepatitis C without hepatic coma [facility])   Discharge Condition: stable  Diet recommendation: heart healthy  Filed Weights   08/30/19 1929  Weight: 49.9 kg    History of present illness:  Sherri Francis an 34 y.o.femalewith h/opolysubstance abuse (cocaine, methamphetamine, heroin,tobacco);depression/anxiety;HCV;endocarditis;C-spine osteomyelitis; andPIDpresenting on 3/24 with wrist pain.She reports having an infection associated with IVDA. She had I&D and is continuing to have pain and swelling but it does feel better. Minimally erythema. No fevers. She was recently started on Methadone and would like very much to resume the medication - she thinks some of her pain and nausea may be associated with opiate withdrawal.  She was admitted with cellulitis and abscess of the L wrist and hand.  She's improved after I&D as well as IV abx.  Discharged with PO abx.  Plan to follow with ortho.    See below for additional details  Hospital Course:  Cellulitis and abscess of left wrist, hand -Patient with h/oIVDA, multiple associated prior infections - I&D L wrist including deep fascia, arthrotomy with joint wrist radiocarpal joint aspiration -> discussed with Dr. Lenon Curt who denied evidence of septic joint - MRI from 3/29 should cellulitis and myositis, but no findings for septic arthritis or osteomyelitis. Mild tenosynovitis. -strep pyogenes on cx, follow final cx  results  -appreciate ID recs - oritavancin, plan to discharge with amoxicillin x 3 weeks -Appreciate Dr. Lenon Curt recs -> follow up in office  Opiate (and polysubstance) dependence -Long-standing heroin dependence -She recently started on Methadone and is noticing withdrawal symptoms -Continue methadone.  Tobacco dependence -encourage cessation.  -Patch ordered  Diarrhea: continue to monitor, mild at this time  Procedures:  I&D L wrist including deep fascia, arthrotomy with joint wrist radiocarpal joint aspiration  Consultations:  Orthopedics  ID  Discharge Exam: Vitals:   09/04/19 0758 09/04/19 1602  BP: 117/64 (!) 132/91  Pulse: 67 75  Resp: 18 17  Temp: 98.2 F (36.8 C) 99.2 F (37.3 C)  SpO2: 100% 100%   Asking to go home  General: No acute distress. Cardiovascular: Heart sounds show Sherri Francis regular rate, and rhythm Lungs: Clear to auscultation bilaterally  Abdomen: Soft, nontender, nondistended  Neurological: Alert and oriented 3. Moves all extremities 4. Cranial nerves II through XII grossly intact. Skin: Warm and dry. No rashes or lesions. Extremities: No clubbing or cyanosis. No edema.   Discharge Instructions   Discharge Instructions    Call MD for:  difficulty breathing, headache or visual disturbances   Complete by: As directed    Call MD for:  extreme fatigue   Complete by: As directed    Call MD for:  hives   Complete by: As directed    Call MD for:  persistant dizziness or light-headedness   Complete by: As directed    Call MD for:  persistant nausea and vomiting   Complete by: As directed    Call MD for:  redness, tenderness, or signs of infection (pain, swelling, redness, odor or green/yellow discharge around incision  site)   Complete by: As directed    Call MD for:  severe uncontrolled pain   Complete by: As directed    Call MD for:  temperature >100.4   Complete by: As directed    Diet - low sodium heart healthy   Complete by: As  directed    Discharge instructions   Complete by: As directed    You were seen for cellulitis and an abscess in your left wrist.   You've been treated with surgery and IV antibiotics.  Please follow up with orthopedics within 10 days.  You're being prescribed amoxicillin to take for the next 3 weeks.  Abstinence from drugs is extremely important for your health as this puts you at higher risk of infection and other complications related to drug use.  Return for new, recurrent, or worsening symptoms.  Please ask your PCP to request records from this hospitalization so they know what was done and what the next steps will be.   Increase activity slowly   Complete by: As directed      Allergies as of 09/04/2019   No Known Allergies     Medication List    TAKE these medications   acetaminophen 500 MG tablet Commonly known as: TYLENOL Take 1,000 mg by mouth every 6 (six) hours as needed for mild pain.   amoxicillin 500 MG tablet Commonly known as: AMOXIL Take 1 tablet (500 mg total) by mouth 3 (three) times daily for 21 days.   methadone 10 MG/ML solution Commonly known as: DOLOPHINE Take 35 mg by mouth daily.   naproxen 500 MG tablet Commonly known as: NAPROSYN Take 1 tablet (500 mg total) by mouth 2 (two) times daily with Sherri Francis meal for 14 days.      No Known Allergies Follow-up Information    Dayna Barker, MD. Schedule an appointment as soon as possible for Sherri Francis visit in 10 day(s).   Specialty: General Surgery Contact information: [address] [place] [address] [phone]            The results of significant diagnostics from this hospitalization (including imaging, microbiology, ancillary and laboratory) are listed below for reference.    Significant Diagnostic Studies: DG Wrist Complete Left  Result Date: 08/30/2019 CLINICAL DATA:  Pt here from [facility] with reports of L wrist pain yesterday with sudden worsening to where she was unable to move  thumb/wrist due to pain. Pt with some swelling to R thumb with hx of endocarditis that presented the same way several years ago. EXAM: LEFT WRIST - COMPLETE 3+ VIEW COMPARISON:  None. FINDINGS: No fracture or bone lesion. Joints are normally spaced and aligned.  No arthropathic changes. There is nonspecific soft tissue swelling that is most evident along the radial and volar aspect the wrist and distal forearm, extending into the hand. No soft tissue air. IMPRESSION: 1. No fracture, bone lesion or joint abnormality. 2. Nonspecific soft tissue swelling. Electronically Signed   By: Lajean Manes M.D.   On: 08/30/2019 14:45   MR HAND LEFT WO CONTRAST  Result Date: 09/04/2019 CLINICAL DATA:  Pain and swelling. EXAM: MRI OF THE LEFT HAND WITHOUT CONTRAST TECHNIQUE: Multiplanar, multisequence MR imaging of the left hand was performed. No intravenous contrast was administered. COMPARISON:  None. FINDINGS: Examination is quite limited as the patient could only tolerate 2 sequences despite meds. T1 and T2 axials with fat saturation were performed. These 2 sequences demonstrate dorsal soft tissue swelling/edema/fluid suggesting cellulitis. No discrete  fluid collection to suggest Kahlea Cobert drainable soft tissue abscess. There is also mild myositis mainly involving the radial aspect of the hand, predominantly the thenar muscles but no findings for pyomyositis. No findings suspicious for septic arthritis or osteomyelitis. The flexor and extensor tendons are intact. There is mild tenosynovitis involving the flexor pollicis longus tendon and the flexor carpi radialis. IMPRESSION: 1. Limited examination as discussed above. 2. Cellulitis and myositis but no discrete drainable soft tissue abscess or pyomyositis. 3. No findings for septic arthritis or osteomyelitis. 4. Mild tenosynovitis involving the flexor pollicis longus tendon and the flexor carpi radialis. Electronically Signed   By: Marijo Sanes M.D.   On: 09/04/2019 05:00   VAS  [place] UPPER EXTREMITY VENOUS DUPLEX  Result Date: 09/02/2019 UPPER VENOUS STUDY  Indications: Pain, Swelling, and IV drug user, infection Comparison Study: No prior study on file. Performing Technologist: Sharion Dove RVS  Examination Guidelines: Aliyyah Riese complete evaluation includes B-mode imaging, spectral Doppler, color Doppler, and power Doppler as needed of all accessible portions of each vessel. Bilateral testing is considered an integral part of Jaimee Corum complete examination. Limited examinations for reoccurring indications may be performed as noted.  Left Findings: +----------+------------+---------+-----------+----------+-------+ LEFT      CompressiblePhasicitySpontaneousPropertiesSummary +----------+------------+---------+-----------+----------+-------+ IJV           Full       Yes       Yes                      +----------+------------+---------+-----------+----------+-------+ Subclavian    Full       Yes       Yes                      +----------+------------+---------+-----------+----------+-------+ Axillary      Full       Yes       Yes                      +----------+------------+---------+-----------+----------+-------+ Brachial      Full       Yes       Yes                      +----------+------------+---------+-----------+----------+-------+ Cephalic      Full                                          +----------+------------+---------+-----------+----------+-------+ Basilic       Full                                          +----------+------------+---------+-----------+----------+-------+  Summary:  Left: No evidence of deep vein thrombosis in the upper extremity. No evidence of superficial vein thrombosis in the upper extremity.  *See table(s) above for measurements and observations.  Diagnosing physician: Deitra Mayo MD Electronically signed by Deitra Mayo MD on 09/02/2019 at 2:35:16 PM.    Final     Microbiology: Recent Results (from the  past 240 hour(s))  Respiratory Panel by RT PCR (Flu Susan Bleich&B, Covid) - Nasopharyngeal Swab     Status: None   Collection Time: 08/30/19  7:45 PM   Specimen: Nasopharyngeal Swab  Result Value Ref Range Status   SARS Coronavirus 2 by RT PCR NEGATIVE NEGATIVE Final  Comment: (NOTE) SARS-CoV-2 target nucleic acids are NOT DETECTED. The SARS-CoV-2 RNA is generally detectable in upper respiratoy specimens during the acute phase of infection. The lowest concentration of SARS-CoV-2 viral copies this assay can detect is 131 copies/mL. Donnesha Karg negative result does not preclude SARS-Cov-2 infection and should not be used as the sole basis for treatment or other patient management decisions. Ina Poupard negative result may occur with  improper specimen collection/handling, submission of specimen other than nasopharyngeal swab, presence of viral mutation(s) within the areas targeted by this assay, and inadequate number of viral copies (<131 copies/mL). Anysa Tacey negative result must be combined with clinical observations, patient history, and epidemiological information. The expected result is Negative. Fact Sheet for Patients:  PinkCheek.be Fact Sheet for Healthcare Providers:  GravelBags.it This test is not yet ap proved or cleared by the [place] FDA and  has been authorized for detection and/or diagnosis of SARS-CoV-2 by FDA under an Emergency Use Authorization (EUA). This EUA will remain  in effect (meaning this test can be used) for the duration of the COVID-19 declaration under Section 564(b)(1) of the Act, 21 U.S.C. section 360bbb-3(b)(1), unless the authorization is terminated or revoked sooner.    Influenza Brittinie Wherley by PCR NEGATIVE NEGATIVE Final   Influenza B by PCR NEGATIVE NEGATIVE Final    Comment: (NOTE) The Xpert Xpress SARS-CoV-2/FLU/RSV assay is intended as an aid in  the diagnosis of influenza from Nasopharyngeal swab specimens and  should not be  used as Staphany Ditton sole basis for treatment. Nasal washings and  aspirates are unacceptable for Xpert Xpress SARS-CoV-2/FLU/RSV  testing. Fact Sheet for Patients: PinkCheek.be Fact Sheet for Healthcare Providers: GravelBags.it This test is not yet approved or cleared by the [place] FDA and  has been authorized for detection and/or diagnosis of SARS-CoV-2 by  FDA under an Emergency Use Authorization (EUA). This EUA will remain  in effect (meaning this test can be used) for the duration of the  Covid-19 declaration under Section 564(b)(1) of the Act, 21  U.S.C. section 360bbb-3(b)(1), unless the authorization is  terminated or revoked. Performed at [facility], [place] [address], [place], [place] [address]   Aerobic/Anaerobic Culture (surgical/deep wound)     Status: None (Preliminary result)   Collection Time: 08/30/19 10:08 PM   Specimen: Forearm, Left; Tissue  Result Value Ref Range Status   Specimen Description WOUND BLOOD LEFT FOREARM  Final   Special Requests DEEP  Final   Gram Stain   Final    FEW WBC PRESENT, PREDOMINANTLY PMN RARE GRAM POSITIVE COCCI    Culture   Final    FEW STREPTOCOCCUS PYOGENES NO ANAEROBES ISOLATED; CULTURE IN PROGRESS FOR 5 DAYS Beta hemolytic streptococci are predictably susceptible to penicillin and other beta lactams. Susceptibility testing not routinely performed. Performed at [facility], [place] [address] [place], [place] [address]    Report Status PENDING  Incomplete     Labs: Basic Metabolic Panel: Recent Labs  Lab 08/30/19 1430 09/02/19 1056  NA 137 138  K 3.9 4.2  CL 105 107  CO2 22 21*  GLUCOSE 90 91  BUN 8 10  CREATININE 0.57 0.57  CALCIUM 8.5* 8.3*   Liver Function Tests: Recent Labs  Lab 08/30/19 1430  AST 17  ALT 33  ALKPHOS 71  BILITOT 0.4  PROT 6.9  ALBUMIN 3.4*   No results for input(s): LIPASE, AMYLASE in the last 168 hours. No results for  input(s): AMMONIA in the last 168 hours. CBC:  Recent Labs  Lab 08/30/19 1430 09/01/19 0516  WBC 10.1 10.6*  NEUTROABS 6.0 5.3  HGB 10.3* 10.6*  HCT 33.1* 33.0*  MCV 81.1 78.9*  PLT 332 380   Cardiac Enzymes: No results for input(s): CKTOTAL, CKMB, CKMBINDEX, TROPONINI in the last 168 hours. BNP: BNP (last 3 results) No results for input(s): BNP in the last 8760 hours.  ProBNP (last 3 results) No results for input(s): PROBNP in the last 8760 hours.  CBG: No results for input(s): GLUCAP in the last 168 hours.     Signed:  Fayrene Helper MD.  [facility] 09/04/2019, 5:25 PM

## 2019-09-04 NOTE — Progress Notes (Signed)
S: feeling better, hand less swollen and painful  O:Blood pressure 117/64, pulse 67, temperature 98.2 F (36.8 C), resp. rate 18, height 5\' 1"  (1.549 m), weight 49.9 kg, last menstrual period 08/17/2019, SpO2 100 %.  Decreased swelling and decreased tenderness , min to no erythema, I&D site without signs of infection  A: s/p I&D infection/cellulitis LUE   P: transition to oral abx per ID, discharge planning.  Local wound care .

## 2019-09-04 NOTE — Anesthesia Postprocedure Evaluation (Signed)
Anesthesia Post Note  Patient: Sherri Francis  Procedure(s) Performed: IRRIGATION AND DEBRIDEMENT LEFT WRIST (Left Wrist)     Patient location during evaluation: PACU Anesthesia Type: General Level of consciousness: awake Pain management: pain level controlled Vital Signs Assessment: post-procedure vital signs reviewed and stable Cardiovascular status: stable Postop Assessment: no apparent nausea or vomiting Anesthetic complications: no    Last Vitals:  Vitals:   09/03/19 1953 09/04/19 0758  BP: 138/76 117/64  Pulse: 70 67  Resp: 18 18  Temp:  36.8 C  SpO2: 100% 100%    Last Pain:  Vitals:   09/04/19 0900  TempSrc:   PainSc: 9                  [name]

## 2019-09-04 NOTE — Progress Notes (Signed)
Called at 2:40 pm on 3/29 to see if patient was ready to re-attempt MRI. Patient refused.

## 2019-09-04 NOTE — Progress Notes (Signed)
Pt refused tylenol overnight.  Pt refused vital signs.

## 2019-09-04 NOTE — Progress Notes (Signed)
Patient ID: Sherri Francis, female   DOB: [birth year], 34 y.o.   MRN: [id number]         [facility]  Date of Admission:  08/30/2019   Total days of antibiotics 6        Day 2 ampicillin sulbactam         ASSESSMENT: She is improving on therapy for group A streptococcal wrist infection.  MRI showed cellulitis, myositis and tenosynovitis without abscess, established septic arthritis or osteomyelitis.  I will give her 1 dose of long-acting oritavancin today oral amoxicillin for 3 more weeks after discharge.  PLAN: 1. IV oritavancin  Principal Problem:   Cellulitis and abscess of hand Active Problems:   IV drug user   Polysubstance abuse ([facility])   Chronic hepatitis C without hepatic coma ([facility])   Scheduled Meds: . acetaminophen  1,000 mg Oral Q8H  . methadone  35 mg Oral Daily  . naproxen  500 mg Oral BID WC  . nicotine  7 mg Transdermal Daily  . sodium chloride flush  3 mL Intravenous Once   Continuous Infusions: . ampicillin-sulbactam (UNASYN) IV 3 g (09/04/19 0553)   PRN Meds:.dicyclomine, hydrOXYzine, loperamide, methocarbamol, morphine injection, ondansetron, oxyCODONE **OR** oxyCODONE   SUBJECTIVE: She is feeling better.  Review of Systems: Review of Systems  Constitutional: Negative for fever.  Musculoskeletal: Positive for joint pain.    No Known Allergies  OBJECTIVE: Vitals:   09/03/19 0933 09/03/19 1230 09/03/19 1953 09/04/19 0758  BP: 126/84 120/66 138/76 117/64  Pulse: 80 79 70 67  Resp: 18 18 18 18   Temp: 98.7 F (37.1 C) 99.7 F (37.6 C) 98.3 F (36.8 C) 98.2 F (36.8 C)  TempSrc: Oral Oral Oral   SpO2: 99% 99% 100% 100%  Weight:      Height:       Body mass index is 20.79 kg/m.  Physical Exam Constitutional:      Comments: She is in good spirits.  The father is visiting.  Musculoskeletal:     Comments: The swelling in her left forearm and hand is much improved.  There is no drainage from her incision.     Lab  Results Lab Results  Component Value Date   WBC 10.6 (H) 09/01/2019   HGB 10.6 (L) 09/01/2019   HCT 33.0 (L) 09/01/2019   MCV 78.9 (L) 09/01/2019   PLT 380 09/01/2019    Lab Results  Component Value Date   CREATININE 0.57 09/02/2019   BUN 10 09/02/2019   NA 138 09/02/2019   K 4.2 09/02/2019   CL 107 09/02/2019   CO2 21 (L) 09/02/2019    Lab Results  Component Value Date   ALT 33 08/30/2019   AST 17 08/30/2019   ALKPHOS 71 08/30/2019   BILITOT 0.4 08/30/2019     Microbiology: Recent Results (from the past 240 hour(s))  Respiratory Panel by RT PCR (Flu A&B, Covid) - Nasopharyngeal Swab     Status: None   Collection Time: 08/30/19  7:45 PM   Specimen: Nasopharyngeal Swab  Result Value Ref Range Status   SARS Coronavirus 2 by RT PCR NEGATIVE NEGATIVE Final    Comment: (NOTE) SARS-CoV-2 target nucleic acids are NOT DETECTED. The SARS-CoV-2 RNA is generally detectable in upper respiratoy specimens during the acute phase of infection. The lowest concentration of SARS-CoV-2 viral copies this assay can detect is 131 copies/mL. A negative result does not preclude SARS-Cov-2 infection and should not be used as the sole basis  for treatment or other patient management decisions. A negative result may occur with  improper specimen collection/handling, submission of specimen other than nasopharyngeal swab, presence of viral mutation(s) within the areas targeted by this assay, and inadequate number of viral copies (<131 copies/mL). A negative result must be combined with clinical observations, patient history, and epidemiological information. The expected result is Negative. Fact Sheet for Patients:  PinkCheek.be Fact Sheet for Healthcare Providers:  GravelBags.it This test is not yet ap proved or cleared by the [place] FDA and  has been authorized for detection and/or diagnosis of SARS-CoV-2 by FDA under an Emergency  Use Authorization (EUA). This EUA will remain  in effect (meaning this test can be used) for the duration of the COVID-19 declaration under Section 564(b)(1) of the Act, 21 U.S.C. section 360bbb-3(b)(1), unless the authorization is terminated or revoked sooner.    Influenza A by PCR NEGATIVE NEGATIVE Final   Influenza B by PCR NEGATIVE NEGATIVE Final    Comment: (NOTE) The Xpert Xpress SARS-CoV-2/FLU/RSV assay is intended as an aid in  the diagnosis of influenza from Nasopharyngeal swab specimens and  should not be used as a sole basis for treatment. Nasal washings and  aspirates are unacceptable for Xpert Xpress SARS-CoV-2/FLU/RSV  testing. Fact Sheet for Patients: PinkCheek.be Fact Sheet for Healthcare Providers: GravelBags.it This test is not yet approved or cleared by the [place] FDA and  has been authorized for detection and/or diagnosis of SARS-CoV-2 by  FDA under an Emergency Use Authorization (EUA). This EUA will remain  in effect (meaning this test can be used) for the duration of the  Covid-19 declaration under Section 564(b)(1) of the Act, 21  U.S.C. section 360bbb-3(b)(1), unless the authorization is  terminated or revoked. Performed at [facility], [place] [address] [place], [place] [address]   Aerobic/Anaerobic Culture (surgical/deep wound)     Status: None (Preliminary result)   Collection Time: 08/30/19 10:08 PM   Specimen: Forearm, Left; Tissue  Result Value Ref Range Status   Specimen Description WOUND BLOOD LEFT FOREARM  Final   Special Requests DEEP  Final   Gram Stain   Final    FEW WBC PRESENT, PREDOMINANTLY PMN RARE GRAM POSITIVE COCCI    Culture   Final    FEW STREPTOCOCCUS PYOGENES NO ANAEROBES ISOLATED; CULTURE IN PROGRESS FOR 5 DAYS Beta hemolytic streptococci are predictably susceptible to penicillin and other beta lactams. Susceptibility testing not routinely performed. Performed  at [facility], [place] [address] [place], [place] [address]    Report Status PENDING  Incomplete    Michel Bickers, MD [facility] [place] [facility] [phone] pager   [phone] cell 09/04/2019, 12:37 PM

## 2019-09-04 NOTE — Progress Notes (Signed)
Patient discharging home today. Discharge instructions explained to patient and she verbalized understanding.  Waiting for IV ABT to complete. No further questions or concerns voiced. Pharmacy delivered medicine to patients room.

## 2019-09-05 LAB — AEROBIC/ANAEROBIC CULTURE W GRAM STAIN (SURGICAL/DEEP WOUND)

## 2021-01-29 ENCOUNTER — Emergency Department (HOSPITAL_COMMUNITY)
Admission: EM | Admit: 2021-01-29 | Discharge: 2021-01-29 | Disposition: A | Discharge status: Home / Self Care | Payer: BLUE CROSS/BLUE SHIELD | Attending: Emergency Medicine | Admitting: Emergency Medicine

## 2021-01-29 ENCOUNTER — Emergency Department (HOSPITAL_COMMUNITY): Payer: BLUE CROSS/BLUE SHIELD

## 2021-01-29 ENCOUNTER — Other Ambulatory Visit: Payer: Self-pay

## 2021-01-29 DIAGNOSIS — R102 Pelvic and perineal pain: Secondary | ICD-10-CM | POA: Insufficient documentation

## 2021-01-29 DIAGNOSIS — Z20822 Contact with and (suspected) exposure to covid-19: Secondary | ICD-10-CM | POA: Diagnosis not present

## 2021-01-29 DIAGNOSIS — F17210 Nicotine dependence, cigarettes, uncomplicated: Secondary | ICD-10-CM | POA: Insufficient documentation

## 2021-01-29 DIAGNOSIS — M25461 Effusion, right knee: Secondary | ICD-10-CM

## 2021-01-29 DIAGNOSIS — B9689 Other specified bacterial agents as the cause of diseases classified elsewhere: Secondary | ICD-10-CM | POA: Insufficient documentation

## 2021-01-29 DIAGNOSIS — N390 Urinary tract infection, site not specified: Secondary | ICD-10-CM | POA: Diagnosis not present

## 2021-01-29 DIAGNOSIS — M25561 Pain in right knee: Secondary | ICD-10-CM | POA: Diagnosis present

## 2021-01-29 LAB — COMPREHENSIVE METABOLIC PANEL
ALT: 33 U/L (ref 0–44)
AST: 28 U/L (ref 15–41)
Albumin: 3.2 g/dL — ABNORMAL LOW (ref 3.5–5.0)
Alkaline Phosphatase: 99 U/L (ref 38–126)
Anion gap: 11 (ref 5–15)
BUN: 11 mg/dL (ref 6–20)
CO2: 21 mmol/L — ABNORMAL LOW (ref 22–32)
Calcium: 8.7 mg/dL — ABNORMAL LOW (ref 8.9–10.3)
Chloride: 99 mmol/L (ref 98–111)
Creatinine, Ser: 0.5 mg/dL (ref 0.44–1.00)
GFR, Estimated: >60 mL/min (ref >60)
Glucose, Bld: 117 mg/dL — ABNORMAL HIGH (ref 70–99)
Potassium: 4.1 mmol/L (ref 3.5–5.1)
Sodium: 131 mmol/L — ABNORMAL LOW (ref 135–145)
Total Bilirubin: 0.3 mg/dL (ref 0.3–1.2)
Total Protein: 7.7 g/dL (ref 6.5–8.1)

## 2021-01-29 LAB — URINALYSIS, ROUTINE W REFLEX MICROSCOPIC
Bilirubin Urine: NEGATIVE
Glucose, UA: NEGATIVE mg/dL
Hgb urine dipstick: NEGATIVE
Ketones, ur: NEGATIVE mg/dL
Nitrite: POSITIVE — AB
Protein, ur: NEGATIVE mg/dL
Specific Gravity, Urine: 1.02 (ref 1.005–1.030)
pH: 6 (ref 5.0–8.0)

## 2021-01-29 LAB — CBC WITH DIFFERENTIAL/PLATELET
Abs Immature Granulocytes: 0.04 10*3/uL (ref 0.00–0.07)
Basophils Absolute: 0 10*3/uL (ref 0.0–0.1)
Basophils Relative: 0 %
Eosinophils Absolute: 0 10*3/uL (ref 0.0–0.5)
Eosinophils Relative: 0 %
HCT: 35.6 % — ABNORMAL LOW (ref 36.0–46.0)
Hemoglobin: 11.3 g/dL — ABNORMAL LOW (ref 12.0–15.0)
Immature Granulocytes: 1 %
Lymphocytes Relative: 17 %
Lymphs Abs: 1.4 10*3/uL (ref 0.7–4.0)
MCH: 25.5 pg — ABNORMAL LOW (ref 26.0–34.0)
MCHC: 31.7 g/dL (ref 30.0–36.0)
MCV: 80.2 fL (ref 80.0–100.0)
Monocytes Absolute: 0.6 10*3/uL (ref 0.1–1.0)
Monocytes Relative: 7 %
Neutro Abs: 6.3 10*3/uL (ref 1.7–7.7)
Neutrophils Relative %: 75 %
Platelets: 573 10*3/uL — ABNORMAL HIGH (ref 150–400)
RBC: 4.44 MIL/uL (ref 3.87–5.11)
RDW: 14.6 % (ref 11.5–15.5)
WBC: 8.3 10*3/uL (ref 4.0–10.5)
nRBC: 0 % (ref 0.0–0.2)

## 2021-01-29 LAB — RESP PANEL BY RT-PCR (FLU A&B, COVID) ARPGX2
Influenza A by PCR: NEGATIVE
Influenza B by PCR: NEGATIVE
SARS Coronavirus 2 by RT PCR: NEGATIVE

## 2021-01-29 LAB — LACTIC ACID, PLASMA
Lactic Acid, Venous: 0.7 mmol/L (ref 0.5–1.9)
Lactic Acid, Venous: 1 mmol/L (ref 0.5–1.9)

## 2021-01-29 LAB — APTT: aPTT: 32 seconds (ref 24–36)

## 2021-01-29 LAB — PROTIME-INR
INR: 1 (ref 0.8–1.2)
Prothrombin Time: 12.9 seconds (ref 11.4–15.2)

## 2021-01-29 LAB — SYNOVIAL CELL COUNT + DIFF, W/ CRYSTALS
Crystals, Fluid: NONE SEEN
Eosinophils-Synovial: 0 % (ref 0–1)
Lymphocytes-Synovial Fld: 11 % (ref 0–20)
Monocyte-Macrophage-Synovial Fluid: 6 % — ABNORMAL LOW (ref 50–90)
Neutrophil, Synovial: 83 % — ABNORMAL HIGH (ref 0–25)
WBC, Synovial: 8275 /mm3 — ABNORMAL HIGH (ref 0–200)

## 2021-01-29 LAB — C-REACTIVE PROTEIN: CRP: 5.8 mg/dL — ABNORMAL HIGH (ref <1.0)

## 2021-01-29 LAB — I-STAT BETA HCG BLOOD, ED (MC, WL, AP ONLY): I-stat hCG, quantitative: <5 m[IU]/mL (ref <5)

## 2021-01-29 LAB — SEDIMENTATION RATE: Sed Rate: 32 mm/hr — ABNORMAL HIGH (ref 0–22)

## 2021-01-29 MED ORDER — DICLOFENAC SODIUM 1 % EX GEL: 2.0000 g | Freq: Four times a day (QID) | CUTANEOUS | 0 refills | Status: AC

## 2021-01-29 MED ORDER — MORPHINE SULFATE (PF) 4 MG/ML IV SOLN
4.0000 mg | Freq: Once | INTRAVENOUS | Status: AC
Start: 2021-01-29 — End: 2021-01-29
  Administered 2021-01-29: 4 mg via INTRAVENOUS
  Filled 2021-01-29: qty 1

## 2021-01-29 MED ORDER — SODIUM CHLORIDE 0.9 % IV SOLN
1.0000 g | Freq: Once | INTRAVENOUS | Status: AC
  Administered 2021-01-29: 1 g via INTRAVENOUS
  Filled 2021-01-29: qty 10

## 2021-01-29 MED ORDER — HYDROMORPHONE HCL 1 MG/ML IJ SOLN
1.0000 mg | Freq: Once | INTRAMUSCULAR | Status: AC
  Administered 2021-01-29: 1 mg via INTRAVENOUS
  Filled 2021-01-29: qty 1

## 2021-01-29 MED ORDER — SULFAMETHOXAZOLE-TRIMETHOPRIM 800-160 MG PO TABS: 1.0000 | ORAL_TABLET | Freq: Two times a day (BID) | ORAL | 0 refills | Status: AC

## 2021-01-29 MED ORDER — IBUPROFEN 600 MG PO TABS: 600.0000 mg | ORAL_TABLET | Freq: Four times a day (QID) | ORAL | 0 refills | Status: AC | PRN

## 2021-01-29 MED ORDER — LIDOCAINE-EPINEPHRINE 1 %-1:100000 IJ SOLN
20.0000 mL | Freq: Once | INTRAMUSCULAR | Status: AC
  Administered 2021-01-29: 20 mL
  Filled 2021-01-29: qty 1

## 2021-01-29 MED ORDER — ACETAMINOPHEN 325 MG PO TABS: 650.0000 mg | ORAL_TABLET | Freq: Once | ORAL | Status: DC

## 2021-01-29 NOTE — ED Provider Notes (Signed)
[facility] EMERGENCY DEPARTMENT Provider Note   CSN: [id number] Arrival date & time: 01/29/21  0041     History Chief Complaint  Patient presents with   Fever   Knee Pain    Sherri Francis is a 35 y.o. female.  The history is provided by the patient and medical records. No language interpreter was used.  Fever Knee Pain Associated symptoms: fever    35 year old female significant history of IV drug use, multiple joint infection in the past secondary to her IV drug use who presents complaining of right knee pain.  Patient reports atraumatic pain to her right knee ongoing for the past 4 days.  Pain is sharp throbbing aching sensation, nonradiating, worse with flexing and extending her knee.  She is having trouble walking on the right knee.  She felt symptoms similar to prior septic arthritis that she had been diagnosed in the past.  She also endorsed subjective fever and chills.  She denies any chest pain or shortness of breath no productive cough.  She did endorse some mild urinary discomfort but no significant abdominal pain vaginal bleeding or vaginal discharge.  No new sexual partner.  She admits to using heroin last use was recently.  She mentioned she relapsed due to her knee pain.  States knee pain started before she use drugs.  Past Medical History:  Diagnosis Date   Anemia    Anxiety    Depression    Endocarditis    Hepatitis C    IVDU (intravenous drug user)    Osteomyelitis of cervical spine ([place]) 09/2017   PID (acute pelvic inflammatory disease)    Suicide attempt [facility])    multiple attempts   Teratoma    UTI (urinary tract infection)     Patient Active Problem List   Diagnosis Date Noted   Chronic hepatitis C without hepatic coma ([place]) 09/01/2019   Cellulitis and abscess of hand 08/30/2019   Sepsis ([place]) 06/03/2018   Acute pyelonephritis 06/03/2018   Hyponatremia 06/03/2018   Polysubstance abuse ([place]) 06/03/2018   Tobacco abuse 06/03/2018    Hypokalemia 10/02/2017   Bacteremia 10/02/2017   Anxiety and depression    Osteomyelitis of cervical spine ([place]) 09/22/2017   IV drug user    Acute osteomyelitis of cervical spine ([place]) 09/20/2017   Opioid abuse with opioid-induced mood disorder ([place]) 02/21/2017   Fever 02/20/2017   Microcytic anemia 02/20/2017   Acute bacterial endocarditis    Substance induced mood disorder ([place]) 01/30/2017   Opioid type dependence, continuous ([place]) 01/30/2017   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/28/2017   Bacteremia due to Staphylococcus aureus 01/27/2017   Pneumonia 01/24/2017   Pelvic abscess in female 01/13/2016   PID (acute pelvic inflammatory disease) 01/13/2016    Past Surgical History:  Procedure Laterality Date   I & D EXTREMITY Left 08/30/2019   Procedure: IRRIGATION AND DEBRIDEMENT LEFT WRIST;  Surgeon: Dayna Barker, MD;  Location: [place];  Service: Plastics;  Laterality: Left;   LAPAROSCOPY     TEE WITHOUT CARDIOVERSION N/A 09/27/2017   Procedure: TRANSESOPHAGEAL ECHOCARDIOGRAM (TEE);  Surgeon: Pixie Casino, MD;  Location: [facility] ENDOSCOPY;  Service: Cardiovascular;  Laterality: N/A;     OB History     Gravida  0   Para  0   Term  0   Preterm  0   AB  0   Living  0      SAB  0   IAB  0   Ectopic  0   Multiple  0   Live Births  0           Family History  Problem Relation Age of Onset   Heart disease Maternal Grandfather    Anxiety disorder Maternal Grandfather    Heart disease Maternal Aunt    Alcohol abuse Father    Drug abuse Father    Cancer Neg Hx     Social History   Tobacco Use   Smoking status: Every Day    Packs/day: 0.25    Types: Cigarettes   Smokeless tobacco: Never   Tobacco comments:    about 2 to 3  cigg per day   Vaping Use   Vaping Use: Never used  Substance Use Topics   Alcohol use: Yes    Comment: Recovering, 3 years ago    Drug use: Yes    Types:  IV, Cocaine, Methamphetamines    Comment: pt denies any current use , states she goes to methadone clinic q am     Home Medications Prior to Admission medications   Medication Sig Start Date End Date Taking? Authorizing Provider  acetaminophen (TYLENOL) 500 MG tablet Take 1,000 mg by mouth every 6 (six) hours as needed for mild pain.    [provider]  methadone (DOLOPHINE) 10 MG/ML solution Take 35 mg by mouth daily.    [provider]    Allergies    Patient has no known allergies.  Review of Systems   Review of Systems  Constitutional:  Positive for fever.  All other systems reviewed and are negative.  Physical Exam Updated Vital Signs BP 121/90 (BP Location: Right Arm)   Pulse 100   Temp 98.5 F (36.9 C)   Resp 16   Ht '5\' 1"'$  (1.549 m)   Wt 50 kg   SpO2 99%   BMI 20.83 kg/m   Physical Exam Vitals and nursing note reviewed.  Constitutional:      General: She is not in acute distress.    Appearance: She is well-developed.  HENT:     Head: Atraumatic.  Eyes:     Conjunctiva/sclera: Conjunctivae normal.  Cardiovascular:     Rate and Rhythm: Normal rate and regular rhythm.  Pulmonary:     Effort: Pulmonary effort is normal.     Breath sounds: Normal breath sounds. No wheezing, rhonchi or rales.  Abdominal:     Palpations: Abdomen is soft.     Tenderness: There is no abdominal tenderness.  Musculoskeletal:        General: Tenderness (Right knee: Right knee is edematous without any surrounding skin erythema or warmth noted.  Decreased flexion extension secondary to pain.) present.     Cervical back: Neck supple.  Skin:    Findings: No rash.     Comments: Multiple track marks noted on bilateral forearms without signs of infection.  Neurological:     Mental Status: She is alert. Mental status is at baseline.  Psychiatric:        Mood and Affect: Mood normal.    ED Results / Procedures / Treatments   Labs (all labs ordered are listed, but  only abnormal results are displayed) Labs Reviewed  COMPREHENSIVE METABOLIC PANEL - Abnormal; Notable for the following components:      Result Value   Sodium 131 (*)    CO2 21 (*)    Glucose, Bld 117 (*)    Calcium 8.7 (*)  Albumin 3.2 (*)    All other components within normal limits  CBC WITH DIFFERENTIAL/PLATELET - Abnormal; Notable for the following components:   Hemoglobin 11.3 (*)    HCT 35.6 (*)    MCH 25.5 (*)    Platelets 573 (*)    All other components within normal limits  URINALYSIS, ROUTINE W REFLEX MICROSCOPIC - Abnormal; Notable for the following components:   APPearance HAZY (*)    Nitrite POSITIVE (*)    Leukocytes,Ua SMALL (*)    Bacteria, UA MANY (*)    All other components within normal limits  RESP PANEL BY RT-PCR (FLU A&B, COVID) ARPGX2  CULTURE, BLOOD (ROUTINE X 2)  CULTURE, BLOOD (ROUTINE X 2)  LACTIC ACID, PLASMA  PROTIME-INR  APTT  LACTIC ACID, PLASMA  I-STAT BETA HCG BLOOD, ED ([facility], [facility], [facility] ONLY)    EKG None  Radiology DG Knee Complete 4 Views Right  Result Date: 01/29/2021 CLINICAL DATA:  Right knee pain, redness, and swelling for 4 days. Febrile. EXAM: RIGHT KNEE - COMPLETE 4+ VIEW COMPARISON:  None. FINDINGS: No evidence of acute fracture or dislocation. No focal bone lesion or bone destruction. No findings to suggest osteomyelitis. There is a small right knee effusion. Soft tissues are otherwise unremarkable. IMPRESSION: No acute bony abnormalities.  Small right knee effusion. Electronically Signed   By: Lucienne Capers M.D.   On: 01/29/2021 01:43    Procedures .Joint Aspiration/Arthrocentesis  Date/Time: 01/29/2021 5:13 PM Performed by: Domenic Moras, PA-C Authorized by: Domenic Moras, PA-C   Consent:    Consent obtained:  Verbal   Consent given by:  Patient   Risks, benefits, and alternatives were discussed: yes     Risks discussed:  Bleeding, infection, incomplete drainage and pain   Alternatives discussed:  Alternative  treatment Universal protocol:    Procedure explained and questions answered to patient or proxy's satisfaction: yes     Relevant documents present and verified: yes     Test results available: yes     Imaging studies available: yes     Patient identity confirmed:  Verbally with patient and hospital-assigned identification number Location:    Location:  Knee   Knee:  R knee Anesthesia:    Anesthesia method:  Local infiltration   Local anesthetic:  Lidocaine 1% WITH epi Procedure details:    Preparation: Patient was prepped and draped in usual sterile fashion     Needle gauge:  18 G   Ultrasound guidance: no     Approach:  Lateral   Aspirate amount:  35   Aspirate characteristics:  Yellow   Steroid injected: no     Specimen collected: yes   Post-procedure details:    Dressing:  Adhesive bandage   Procedure completion:  Tolerated   Medications Ordered in ED Medications  acetaminophen (TYLENOL) tablet 650 mg (has no administration in time range)  lidocaine-EPINEPHrine (XYLOCAINE W/EPI) 1 %-1:100000 (with pres) injection 20 mL (20 mLs Infiltration Given 01/29/21 1718)  cefTRIAXone (ROCEPHIN) 1 g in sodium chloride 0.9 % 100 mL IVPB (0 g Intravenous Stopped 01/29/21 1841)  morphine 4 MG/ML injection 4 mg (4 mg Intravenous Given 01/29/21 1803)  HYDROmorphone (DILAUDID) injection 1 mg (1 mg Intravenous Given 01/29/21 1907)    ED Course  I have reviewed the triage vital signs and the nursing notes.  Pertinent labs & imaging results that were available during my care of the patient were reviewed by me and considered in my medical decision making (see chart for details).  MDM Rules/Calculators/A&P                           BP 135/89   Pulse 89   Temp 98.5 F (36.9 C)   Resp 14   Ht '5\' 1"'$  (1.549 m)   Wt 50 kg   SpO2 98%   BMI 20.83 kg/m   Final Clinical Impression(s) / ED Diagnoses Final diagnoses:  Effusion of right knee joint  Acute lower UTI    Rx / DC Orders ED  Discharge Orders          Ordered    sulfamethoxazole-trimethoprim (BACTRIM DS) 800-160 MG tablet  2 times daily        01/29/21 2127    ibuprofen (ADVIL) 600 MG tablet  Every 6 hours PRN        01/29/21 2127    diclofenac Sodium (VOLTAREN) 1 % GEL  4 times daily        01/29/21 2127           5:14 PM Patient with significant history of IV drug use and septic arthritis in the past here with atraumatic right knee pain for the past 4 days.  She is unable to flex or extend her knee due to significant discomfort.  Knee is edematous without erythema or warmth.  No surrounding cellulitis.  Due to her risk of septic arthritis, I did perform a knee aspiration to obtain synovial fluid.  Synovial fluid is hay color, and noncloudy. Furthermore, patient also has a urinalysis consistent with a urinary tract infection.  Will treat with Rocephin, pain medication given.  9:25 PM Synovial fluid results shows an elevated white count of 8275 likely consistent with inflammatory response, no crystals seen, this finding is not consistent with septic joint.  I discussed care with patient.  Will prescribe anti-inflammatory medication to help her with her discomfort.  She will also receive antibiotic as treatment for her urinary tract infection.  Patient made aware to return if condition worsen.  Otherwise she is stable for discharge.  I also spent time encourage patient to avoid IV drug use, and I did provide resources for outpatient psychiatric help along with substance abuse help.  Patient voiced understanding and agrees with plan.   Domenic Moras, PA-C 01/29/21 2129    Daleen Bo, MD 01/30/21 [phone]

## 2021-01-29 NOTE — ED Provider Notes (Signed)
Emergency Medicine Provider Triage Evaluation Note  Sherri Francis , a 35 y.o. female  was evaluated in triage.  Pt complains of right knee pain, swelling, and redness for the last 4 days.  She is started to felt febrile yesterday.  She endorses chills.  Pain has now worsened and she is having difficulty ambulating and performing range of motion on her right knee.  She has a history of septic arthritis 1 year ago and states that the pain in her knee feels similar.  She uses IV heroin, last use was 2 days ago.  She does smoke meth earlier today to try and help with pain, but denies regular methamphetamine use.  She denies chest pain, shortness of breath, back pain, vomiting, right hip or ankle pain, numbness, or weakness.  Review of Systems  Positive: Arthralgias, myalgias, fever, chills Negative: Chest pain, shortness of breath, back pain, vomiting, right hip or ankle pain, numbness, weakness  Physical Exam  BP (!) 143/89 (BP Location: Left Arm)   Pulse (!) 122   Temp (!) 102.1 F (38.9 C)   Resp 18   SpO2 98%  Gen:   Awake, no distress   Resp:  Normal effort  MSK:   Moves extremities without difficulty  Other:  Right knee is edematous.  Tenderness to palpation to the right knee.  Pain with range of motion.  She reports subjective decrease sensation to the left lower extremity when compared to the right.  DP pulses are 2+ and symmetric.  Medical Decision Making  Medically screening exam initiated at 12:58 AM.  Appropriate orders placed.  Kryslynn Luckenbill was informed that the remainder of the evaluation will be completed by another provider, this initial triage assessment does not replace that evaluation, and the importance of remaining in the ED until their evaluation is complete.  Patient with a history of IV heroin use, last used 2 days ago.  She is tachycardic and febrile on arrival to the ED and endorses right knee redness, pain, and swelling.  She has now having difficulty standing or  ambulating due to worsening pain.  Strong suspicion for septic joint.  Will order labs and x-ray of the right knee.  She will require further work-up and evaluation in the ED for her symptoms.  Tylenol given for fever in triage.   Joanne Gavel, PA-C 01/29/21 0107    Merryl Hacker, MD 01/29/21 [phone]

## 2021-01-29 NOTE — Discharge Instructions (Addendum)
You have been evaluated for your symptoms.  Fortunately no evidence of septic joint of your right knee.  You may wrap the knee with Ace wrap, apply Voltaren gel as needed for pain.  Take ibuprofen as it will help with your inflammation.  You have also been diagnosed with a urinary tract infection.  Take Bactrim as prescribed.  Follow-up with your doctor for further care.  Please use resource below to seek help with substance abuse needs.

## 2021-01-29 NOTE — ED Triage Notes (Signed)
Pt c/o right knee pain for the past 4 days. Pt also reports a fever.

## 2021-02-02 LAB — BODY FLUID CULTURE W GRAM STAIN: Culture: NO GROWTH

## 2021-02-03 LAB — CULTURE, BLOOD (ROUTINE X 2)
Culture: NO GROWTH
Culture: NO GROWTH
Special Requests: ADEQUATE

## 2021-02-04 LAB — ANAEROBIC CULTURE W GRAM STAIN

## 2021-02-05 ENCOUNTER — Telehealth: Payer: Self-pay | Admitting: *Deleted

## 2021-02-05 NOTE — Telephone Encounter (Signed)
Post ED Visit - Positive Culture Follow-up  Culture report reviewed by antimicrobial stewardship pharmacist: [place] Team '[]'$  Elenor Quinones, Pharm.D. '[]'$  Heide Guile, Pharm.D., BCPS AQ-ID '[]'$  Parks Neptune, Pharm.D., BCPS '[]'$  Alycia Rossetti, Pharm.D., BCPS '[]'$  [place], [place].D., BCPS, AAHIVP '[]'$  Legrand Como, Pharm.D., BCPS, AAHIVP '[]'$  Salome Arnt, PharmD, BCPS '[]'$  Johnnette Gourd, PharmD, BCPS '[]'$  Hughes Better, PharmD, BCPS '[]'$  Leeroy Cha, PharmD '[]'$  Laqueta Linden, PharmD, BCPS '[]'$  Albertina Parr, PharmD  [place] Team '[]'$  Leodis Sias, PharmD '[]'$  Lindell Spar, PharmD '[]'$  Royetta Asal, PharmD '[]'$  Graylin Shiver, Rph '[]'$  Rema Fendt) Glennon Mac, PharmD '[]'$  Arlyn Dunning, PharmD '[]'$  Netta Cedars, PharmD '[]'$  Dia Sitter, PharmD '[]'$  Leone Haven, PharmD '[]'$  Gretta Arab, PharmD '[]'$  Theodis Shove, PharmD '[]'$  Peggyann Juba, PharmD '[]'$  Reuel Boom, PharmD   Positive wound culture Treated with Sulfamethoxazole-Trimethoprim, organism sensitive to the same and no further patient follow-up is required at this time.  Marko Plume, PharmD  Harlon Flor Talley 02/05/2021, 10:29 AM
